# Patient Record
Sex: Male | Born: 1945 | Race: White | Hispanic: No | Marital: Single | Smoking: Former smoker
Health system: Southern US, Community
[De-identification: ages and names within clinical notes are randomized; demographics above are authoritative.]

## PROBLEM LIST (undated history)

## (undated) DIAGNOSIS — K529 Noninfective gastroenteritis and colitis, unspecified: Secondary | ICD-10-CM

## (undated) DIAGNOSIS — A809 Acute poliomyelitis, unspecified: Secondary | ICD-10-CM

## (undated) DIAGNOSIS — F419 Anxiety disorder, unspecified: Secondary | ICD-10-CM

## (undated) DIAGNOSIS — M1990 Unspecified osteoarthritis, unspecified site: Secondary | ICD-10-CM

## (undated) DIAGNOSIS — D649 Anemia, unspecified: Secondary | ICD-10-CM

## (undated) DIAGNOSIS — K219 Gastro-esophageal reflux disease without esophagitis: Secondary | ICD-10-CM

## (undated) DIAGNOSIS — M2550 Pain in unspecified joint: Secondary | ICD-10-CM

## (undated) DIAGNOSIS — L439 Lichen planus, unspecified: Secondary | ICD-10-CM

## (undated) DIAGNOSIS — F32A Depression, unspecified: Secondary | ICD-10-CM

## (undated) DIAGNOSIS — G709 Myoneural disorder, unspecified: Secondary | ICD-10-CM

## (undated) DIAGNOSIS — F329 Major depressive disorder, single episode, unspecified: Secondary | ICD-10-CM

## (undated) DIAGNOSIS — C069 Malignant neoplasm of mouth, unspecified: Secondary | ICD-10-CM

## (undated) DIAGNOSIS — F988 Other specified behavioral and emotional disorders with onset usually occurring in childhood and adolescence: Secondary | ICD-10-CM

## (undated) DIAGNOSIS — K222 Esophageal obstruction: Secondary | ICD-10-CM

## (undated) DIAGNOSIS — M810 Age-related osteoporosis without current pathological fracture: Secondary | ICD-10-CM

## (undated) DIAGNOSIS — H269 Unspecified cataract: Secondary | ICD-10-CM

## (undated) DIAGNOSIS — T7840XA Allergy, unspecified, initial encounter: Secondary | ICD-10-CM

## (undated) DIAGNOSIS — L309 Dermatitis, unspecified: Secondary | ICD-10-CM

## (undated) DIAGNOSIS — K649 Unspecified hemorrhoids: Secondary | ICD-10-CM

## (undated) DIAGNOSIS — K429 Umbilical hernia without obstruction or gangrene: Secondary | ICD-10-CM

## (undated) DIAGNOSIS — R079 Chest pain, unspecified: Secondary | ICD-10-CM

## (undated) HISTORY — DX: Allergy, unspecified, initial encounter: T78.40XA

## (undated) HISTORY — DX: Umbilical hernia without obstruction or gangrene: K42.9

## (undated) HISTORY — DX: Malignant neoplasm of mouth, unspecified: C06.9

## (undated) HISTORY — DX: Age-related osteoporosis without current pathological fracture: M81.0

## (undated) HISTORY — DX: Gastro-esophageal reflux disease without esophagitis: K21.9

## (undated) HISTORY — PX: COLONOSCOPY: SHX174

## (undated) HISTORY — DX: Esophageal obstruction: K22.2

## (undated) HISTORY — DX: Chest pain, unspecified: R07.9

## (undated) HISTORY — DX: Noninfective gastroenteritis and colitis, unspecified: K52.9

## (undated) HISTORY — DX: Anxiety disorder, unspecified: F41.9

## (undated) HISTORY — DX: Unspecified hemorrhoids: K64.9

## (undated) HISTORY — DX: Depression, unspecified: F32.A

## (undated) HISTORY — DX: Acute poliomyelitis, unspecified: A80.9

## (undated) HISTORY — DX: Unspecified osteoarthritis, unspecified site: M19.90

## (undated) HISTORY — DX: Unspecified cataract: H26.9

## (undated) HISTORY — DX: Dermatitis, unspecified: L30.9

## (undated) HISTORY — PX: TONGUE SURGERY: SHX810

## (undated) HISTORY — DX: Myoneural disorder, unspecified: G70.9

## (undated) HISTORY — DX: Lichen planus, unspecified: L43.9

## (undated) HISTORY — DX: Major depressive disorder, single episode, unspecified: F32.9

## (undated) HISTORY — PX: UPPER GASTROINTESTINAL ENDOSCOPY: SHX188

## (undated) HISTORY — DX: Anemia, unspecified: D64.9

## (undated) HISTORY — DX: Pain in unspecified joint: M25.50

## (undated) HISTORY — PX: LEG SURGERY: SHX1003

## (undated) HISTORY — DX: Other specified behavioral and emotional disorders with onset usually occurring in childhood and adolescence: F98.8

---

## 1984-01-25 DIAGNOSIS — K529 Noninfective gastroenteritis and colitis, unspecified: Secondary | ICD-10-CM

## 1984-01-25 HISTORY — DX: Noninfective gastroenteritis and colitis, unspecified: K52.9

## 2004-02-24 ENCOUNTER — Ambulatory Visit: Payer: Self-pay | Admitting: Sports Medicine

## 2004-04-06 ENCOUNTER — Ambulatory Visit: Payer: Self-pay | Admitting: Sports Medicine

## 2006-01-19 ENCOUNTER — Ambulatory Visit: Payer: Self-pay | Admitting: Internal Medicine

## 2010-01-07 ENCOUNTER — Encounter (INDEPENDENT_AMBULATORY_CARE_PROVIDER_SITE_OTHER): Payer: Self-pay | Admitting: *Deleted

## 2010-01-12 ENCOUNTER — Encounter (INDEPENDENT_AMBULATORY_CARE_PROVIDER_SITE_OTHER): Payer: Self-pay | Admitting: *Deleted

## 2010-01-15 ENCOUNTER — Ambulatory Visit: Payer: Self-pay | Admitting: Internal Medicine

## 2010-01-15 ENCOUNTER — Encounter (INDEPENDENT_AMBULATORY_CARE_PROVIDER_SITE_OTHER): Payer: Self-pay | Admitting: *Deleted

## 2010-01-24 HISTORY — PX: COLONOSCOPY: SHX174

## 2010-02-25 NOTE — Miscellaneous (Signed)
Summary: previsit prep/rm  Clinical Lists Changes  Medications: Added new medication of MOVIPREP 100 GM  SOLR (PEG-KCL-NACL-NASULF-NA ASC-C) As per prep instructions. - Signed Rx of MOVIPREP 100 GM  SOLR (PEG-KCL-NACL-NASULF-NA ASC-C) As per prep instructions.;  #1 x 0;  Signed;  Entered by: Sherren Kerns RN;  Authorized by: Hilarie Fredrickson MD;  Method used: Electronically to [facility] # [phone]*, [address] [place], [place]  [address], Ph: [phone], Fax: [phone] Allergies: Added new allergy or adverse reaction of PENICILLIN Observations: Added new observation of ALLERGY REV: Done (01/15/2010 11:02) Added new observation of NKA: F (01/15/2010 11:02)    Prescriptions: MOVIPREP 100 GM  SOLR (PEG-KCL-NACL-NASULF-NA ASC-C) As per prep instructions.  #1 x 0   Entered by:   Sherren Kerns RN   Authorized by:   Hilarie Fredrickson MD   Signed by:   Sherren Kerns RN on 01/15/2010   Method used:   Electronically to        [facility] # [phone]* (retail)       [address]       [place], [place]  [address]       Ph: [phone]       Fax: [phone]   RxID:   [id number]

## 2010-02-25 NOTE — Letter (Signed)
Summary: [facility] Instructions  [facility]  [address] [place], [place] [address]   Phone: [phone]  Fax: [phone]       Jonathan Garrison    12/25/1945    MRN: [id number]        Procedure Day /Date:  Monday 03/01/10     Arrival Time:  9:00 a.m.     Procedure Time:   10:00 a.m.     Location of Procedure:                    _X _  [facility] (4th Floor)                      PREPARATION FOR COLONOSCOPY WITH MOVIPREP   Starting 5 days prior to your procedure  02-24-10  do not eat nuts, seeds, popcorn, corn, beans, peas,  salads, or any raw vegetables.  Do not take any fiber supplements (e.g. Metamucil, Citrucel, and Benefiber).  THE DAY BEFORE YOUR PROCEDURE         DATE: 02-28-10  DAY: Sunday  1.  Drink clear liquids the entire day-NO SOLID FOOD  2.  Do not drink anything colored red or purple.  Avoid juices with pulp.  No orange juice.  3.  Drink at least 64 oz. (8 glasses) of fluid/clear liquids during the day to prevent dehydration and help the prep work efficiently.  CLEAR LIQUIDS INCLUDE: Water Jello Ice Popsicles Tea (sugar ok, no milk/cream) Powdered fruit flavored drinks Coffee (sugar ok, no milk/cream) Gatorade Juice: apple, white grape, white cranberry  Lemonade Clear bullion, consomm, broth Carbonated beverages (any kind) Strained chicken noodle soup Hard Candy                             4.  In the morning, mix first dose of MoviPrep solution:    Empty 1 Pouch A and 1 Pouch B into the disposable container    Add lukewarm drinking water to the top line of the container. Mix to dissolve    Refrigerate (mixed solution should be used within 24 hrs)  5.  Begin drinking the prep at 5:00 p.m. The MoviPrep container is divided by 4 marks.   Every 15 minutes drink the solution down to the next mark (approximately 8 oz) until the full liter is complete.   6.  Follow completed prep with 16 oz of clear liquid of your choice  (Nothing red or purple).  Continue to drink clear liquids until bedtime.  7.  Before going to bed, mix second dose of MoviPrep solution:    Empty 1 Pouch A and 1 Pouch B into the disposable container    Add lukewarm drinking water to the top line of the container. Mix to dissolve    Refrigerate  THE DAY OF YOUR PROCEDURE      DATE:  03-01-10  DAY:  Monday  Beginning at  5:00 a.m. (5 hours before procedure):         1. Every 15 minutes, drink the solution down to the next mark (approx 8 oz) until the full liter is complete.  2. Follow completed prep with 16 oz. of clear liquid of your choice.    3. You may drink clear liquids until  8:00 a.m.  (2 HOURS BEFORE PROCEDURE).   MEDICATION INSTRUCTIONS  Unless otherwise instructed, you should take regular prescription medications with a small sip of  water   as early as possible the morning of your procedure.           OTHER INSTRUCTIONS  You will need a responsible adult at least 64 years of age to accompany you and drive you home.   This person must remain in the waiting room during your procedure.  Wear loose fitting clothing that is easily removed.  Leave jewelry and other valuables at home.  However, you may wish to bring a book to read or  an iPod/MP3 player to listen to music as you wait for your procedure to start.  Remove all body piercing jewelry and leave at home.  Total time from sign-in until discharge is approximately 2-3 hours.  You should go home directly after your procedure and rest.  You can resume normal activities the  day after your procedure.  The day of your procedure you should not:   Drive   Make legal decisions   Operate machinery   Drink alcohol   Return to work  You will receive specific instructions about eating, activities and medications before you leave.    The above instructions have been reviewed and explained to me by   Sherren Kerns RN  January 15, 2010 11:38 AM    I  fully understand and can verbalize these instructions _____________________________ Date _________

## 2010-02-25 NOTE — Letter (Signed)
Summary: [facility] Instructions  [facility]  [address] [place], [place] [address]   Phone: [phone]  Fax: [phone]       Jonathan Garrison    1945-04-06    MRN: [id number]        Procedure Day /Date: Monday 03-01-10     Arrival Time: 10:30 am     Procedure Time: 11:30 am     Location of Procedure:                    _x _  [facility] (4th Floor)                        PREPARATION FOR COLONOSCOPY WITH MOVIPREP   Starting 5 days prior to your procedure  02-24-10 do not eat nuts, seeds, popcorn, corn, beans, peas,  salads, or any raw vegetables.  Do not take any fiber supplements (e.g. Metamucil, Citrucel, and Benefiber).  THE DAY BEFORE YOUR PROCEDURE         DATE:  02-28-10  DAY:  Sunday   1.  Drink clear liquids the entire day-NO SOLID FOOD  2.  Do not drink anything colored red or purple.  Avoid juices with pulp.  No orange juice.  3.  Drink at least 64 oz. (8 glasses) of fluid/clear liquids during the day to prevent dehydration and help the prep work efficiently.  CLEAR LIQUIDS INCLUDE: Water Jello Ice Popsicles Tea (sugar ok, no milk/cream) Powdered fruit flavored drinks Coffee (sugar ok, no milk/cream) Gatorade Juice: apple, white grape, white cranberry  Lemonade Clear bullion, consomm, broth Carbonated beverages (any kind) Strained chicken noodle soup Hard Candy                             4.  In the morning, mix first dose of MoviPrep solution:    Empty 1 Pouch A and 1 Pouch B into the disposable container    Add lukewarm drinking water to the top line of the container. Mix to dissolve    Refrigerate (mixed solution should be used within 24 hrs)  5.  Begin drinking the prep at 5:00 p.m. The MoviPrep container is divided by 4 marks.   Every 15 minutes drink the solution down to the next mark (approximately 8 oz) until the full liter is complete.   6.  Follow completed prep with 16 oz of clear liquid of your choice  (Nothing red or purple).  Continue to drink clear liquids until bedtime.  7.  Before going to bed, mix second dose of MoviPrep solution:    Empty 1 Pouch A and 1 Pouch B into the disposable container    Add lukewarm drinking water to the top line of the container. Mix to dissolve    Refrigerate  THE DAY OF YOUR PROCEDURE      DATE:  03-01-10  DAY:  Monday  Beginning at  6:30 a.m. (5 hours before procedure):         1. Every 15 minutes, drink the solution down to the next mark (approx 8 oz) until the full liter is complete.  2. Follow completed prep with 16 oz. of clear liquid of your choice.    3. You may drink clear liquids until  9:30 a.m.  (2 HOURS BEFORE PROCEDURE).   MEDICATION INSTRUCTIONS  Unless otherwise instructed, you should take regular prescription medications with a small sip of  water   as early as possible the morning of your procedure.           OTHER INSTRUCTIONS  You will need a responsible adult at least 64 years of age to accompany you and drive you home.   This person must remain in the waiting room during your procedure.  Wear loose fitting clothing that is easily removed.  Leave jewelry and other valuables at home.  However, you may wish to bring a book to read or  an iPod/MP3 player to listen to music as you wait for your procedure to start.  Remove all body piercing jewelry and leave at home.  Total time from sign-in until discharge is approximately 2-3 hours.  You should go home directly after your procedure and rest.  You can resume normal activities the  day after your procedure.  The day of your procedure you should not:   Drive   Make legal decisions   Operate machinery   Drink alcohol   Return to work  You will receive specific instructions about eating, activities and medications before you leave.    The above instructions have been reviewed and explained to me by   Sherren Kerns RN  January 15, 2010 11:58 AM   I  fully understand and can verbalize these instructions _____________________________ Date _________

## 2010-02-25 NOTE — Letter (Signed)
Summary: Pre Visit Letter Revised  [facility]  [address] [place], [place] [address]   Phone: [phone]  Fax: [phone]        01/07/2010 MRN: [id number] Jonathan Garrison [address] [place], [place]  [address]             Procedure Date:  01/28/2010   Welcome to the Gastroenterology Division at [facility].    You are scheduled to see a nurse for your pre-procedure visit on 01/15/2010 at 11:00 AM on the 3rd floor at [facility], [address]  We ask that you try to arrive at our office 15 minutes prior to your appointment time to allow for check-in.  Please take a minute to review the attached form.  If you answer "Yes" to one or more of the questions on the first page, we ask that you call the person listed at your earliest opportunity.  If you answer "No" to all of the questions, please complete the rest of the form and bring it to your appointment.    Your nurse visit will consist of discussing your medical and surgical history, your immediate family medical history, and your medications.   If you are unable to list all of your medications on the form, please bring the medication bottles to your appointment and we will list them.  We will need to be aware of both prescribed and over the counter drugs.  We will need to know exact dosage information as well.    Please be prepared to read and sign documents such as consent forms, a financial agreement, and acknowledgement forms.  If necessary, and with your consent, a friend or relative is welcome to sit-in on the nurse visit with you.  Please bring your insurance card so that we may make a copy of it.  If your insurance requires a referral to see a specialist, please bring your referral form from your primary care physician.  No co-pay is required for this nurse visit.     If you cannot keep your appointment, please call [phone] to cancel or reschedule prior to your appointment date.  This  allows [place] the opportunity to schedule an appointment for another patient in need of care.    Thank you for choosing [facility] for your medical needs.  We appreciate the opportunity to care for you.  Please visit [place] at our website  to learn more about our practice.  Sincerely, The Gastroenterology Division

## 2010-03-01 ENCOUNTER — Other Ambulatory Visit (AMBULATORY_SURGERY_CENTER): Payer: BC Managed Care – PPO | Admitting: Internal Medicine

## 2010-03-01 ENCOUNTER — Encounter: Payer: Self-pay | Admitting: Internal Medicine

## 2010-03-01 DIAGNOSIS — K648 Other hemorrhoids: Secondary | ICD-10-CM

## 2010-03-01 DIAGNOSIS — Z1211 Encounter for screening for malignant neoplasm of colon: Secondary | ICD-10-CM

## 2010-03-11 NOTE — Procedures (Signed)
Summary: Colonoscopy   Colonoscopy  Procedure date:  03/01/2010  Findings:      Location:  [facility].    Procedures Next Due Date:    Colonoscopy: 02/2020 COLONOSCOPY PROCEDURE REPORT  PATIENT:  Jonathan Garrison, Jonathan Garrison  MR#:  [id number] BIRTHDATE:   Jonathan Garrison, Jonathan Garrison, 64 yrs. old   GENDER:   male ENDOSCOPIST:   Jonathan Bonito. Eda Keys, MD REF. BY: Jonathan Garrison, M.D. PROCEDURE DATE:  03/01/2010 PROCEDURE:  Average-risk screening colonoscopy G0121 ASA CLASS:   Class II INDICATIONS: Routine Risk Screening  MEDICATIONS:    Fentanyl 75 mcg IV, Versed 9 mg IV  DESCRIPTION OF PROCEDURE:   After the risks benefits and alternatives of the procedure were thoroughly explained, informed consent was obtained.  Digital rectal exam was performed and revealed no abnormalities.   The LB 180AL [id number] endoscope was introduced through the anus and advanced to the cecum, which was identified by both the appendix and ileocecal valve, without limitations.Time to cecum = 7:40 min.  The quality of the prep was good, using MoviPrep.  The instrument was then slowly withdrawn (time = 14:00 min) as the colon was fully examined. <<PROCEDUREIMAGES>>        <<OLD IMAGES>>  FINDINGS:  A normal appearing cecum, ileocecal valve, and appendiceal orifice were identified. The ascending, hepatic flexure, transverse, splenic flexure, descending, sigmoid colon, and rectum appeared unremarkable.  No polyps or cancers were seen.   Retroflexed views in the rectum revealed internal hemorrhoids.    The scope was then withdrawn from the patient and the procedure completed.  COMPLICATIONS:   None ENDOSCOPIC IMPRESSION:  1) Normal colon  2) No polyps or cancers  3) Small Internal hemorrhoids RECOMMENDATIONS:  1) Continue current colorectal screening recommendations for "routine risk" patients with a repeat colonoscopy in 10 years.  _______________________________ Jonathan Bonito. Eda Keys, MD  CC: Jonathan Sia, MD; The  Patient

## 2010-05-25 DIAGNOSIS — C069 Malignant neoplasm of mouth, unspecified: Secondary | ICD-10-CM

## 2010-05-25 HISTORY — DX: Malignant neoplasm of mouth, unspecified: C06.9

## 2010-07-08 ENCOUNTER — Encounter: Payer: Self-pay | Admitting: Family Medicine

## 2010-07-08 ENCOUNTER — Ambulatory Visit (INDEPENDENT_AMBULATORY_CARE_PROVIDER_SITE_OTHER): Payer: BC Managed Care – PPO | Admitting: Family Medicine

## 2010-07-08 VITALS — BP 128/78 | HR 84 | Ht 73.5 in | Wt 200.0 lb

## 2010-07-08 DIAGNOSIS — F988 Other specified behavioral and emotional disorders with onset usually occurring in childhood and adolescence: Secondary | ICD-10-CM

## 2010-07-08 DIAGNOSIS — D485 Neoplasm of uncertain behavior of skin: Secondary | ICD-10-CM

## 2010-07-08 DIAGNOSIS — C069 Malignant neoplasm of mouth, unspecified: Secondary | ICD-10-CM

## 2010-07-08 MED ORDER — AMPHETAMINE-DEXTROAMPHETAMINE 10 MG PO TABS: ORAL_TABLET | ORAL | Status: DC

## 2010-07-08 NOTE — Progress Notes (Signed)
Subjective:    Patient ID: Jonathan Garrison, male    DOB: 05-20-1945, 65 y.o.   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  Scheduled for excision with Dr. Vertell Limber at [facility] on 07/21/10.  Patient has an unhealing sore at the top of his "butt crack", with a whitish crust.  Has been present x months.  Sees the dermatologist regularly, but this wasn't noticed/discussed at last visit.  He has an appointment with Dr. Sherryl Barters assistant on Monday for a biopsy.  Previously saw Dr. Merla Riches for his physicals, but wanting to change physicians.  Patient has been on Adderal for about 15 years.  Has been on current dose for a long time--takes 10mg  in the morning, and 5mg  at 4-5 pm when he gets home.  Finds that he gets very distracted after getting home, and isn't very productive.  Wondering if he needs to change dose.  House is very distracting, has a lot of things, including things from his mother (who passed away 3 years ago).  He describes himself as "almost a hoarder".  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  Patient has questions regarding herpes lab results, brought in by patient.  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  Explained that his history of having had cold sores in the past explains this.  Past Medical History  Diagnosis Date  . ADD (attention deficit disorder) without hyperactivity   . 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 Eczema   . Lichen planus   . Allergy   . Polio age 59  . Arthralgia     many joints, managed with vitamins/herbs and ibuprofen  . Colitis 1986    single attack (believes was ulcerative)  . Pleurisy 1984    Past Surgical History  Procedure Date  . Leg surgery when in 6th grade    L leg, bone spur removal (just above knee, laterally)    History   Social History  . Marital Status: Divorced    Spouse Name: N/A    Number of Children: N/A  . Years of  Education: N/A   Occupational History  . 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 Smoking status: Former Smoker    Types: Pipe, Cigarettes    Quit date: 01/25/1976  . Smokeless tobacco: Never Used  . Alcohol Use: Yes     socially 1-2 times per month  . Drug Use: No  . Sexually Active: Not on file   Other Topics Concern  . Not on file   Social History Narrative  . No narrative on file    Family History  Problem Relation Age of Onset  . Hypertension Mother   . Diabetes Mother   . Hyperlipidemia Mother   . Hypertension Father   . Benign prostatic hyperplasia Father   . Heart disease Father   . Lichen planus Sister   . Cancer Sister     ?spot on chest?  . Diabetes Maternal Grandfather     Current outpatient prescriptions:amphetamine-dextroamphetamine (ADDERALL) 10 MG tablet, 1 tablet in the morning, 1/2- 1 tablet in the early afternoon, Disp: 60 tablet, Rfl: 0;  aspirin 325 MG tablet, Take 87.5 mg by mouth daily.  , Disp: , Rfl: ;  b complex vitamins tablet, Take 0.5 tablets by mouth daily.  , Disp: ,  Rfl: ;  chlorpheniramine (CHLOR-TRIMETON) 4 MG tablet, Take 2 mg by mouth 2 (two) times daily as needed.  , Disp: , Rfl:  glucosamine-chondroitin 500-400 MG tablet, Take 1 tablet by mouth 2 (two) times daily.  , Disp: , Rfl: ;  ibuprofen (ADVIL,MOTRIN) 200 MG tablet, Take 400 mg by mouth 2 (two) times daily.  , Disp: , Rfl: ;  Lysine 500 MG TABS, Take 2 tablets by mouth daily.  , Disp: , Rfl: ;  Multiple Vitamins-Minerals (MULTIVITAMIN WITH MINERALS) tablet, Take 1 tablet by mouth daily.  , Disp: , Rfl:  Omega-3 Fatty Acids (FISH OIL) 1000 MG CAPS, Take 1 capsule by mouth daily.  , Disp: , Rfl: ;  vitamin C (ASCORBIC ACID) 500 MG tablet, Take 250 mg by mouth 2 (two) times daily.  , Disp: , Rfl: ;  vitamin E 100 UNIT capsule, Take 100 Units by mouth daily.  , Disp: , Rfl:   Allergies  Allergen Reactions  . Penicillins     REACTION: ?  .  Sulfa Antibiotics Other (See Comments)    unknown    Review of Systems Denies fevers.  Complains of alternating loose stools and hard stools.  Occasional depression, h/o occasional suicidal thoughts.  Chronic trouble falling asleep.  Reports a lot of stress--related to recent dx of oral cancer, mother's illness/death and dealing with her estate.  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  Occasional rapid heartbeat/palpitation noted    Objective:   Physical Exam  Well developed, well nourished patient, in no distress, appears mildly anxious and very talkative, sometimes switching topics frequently BP 128/78  Pulse 84  Ht 6' 1.5" (1.867 m)  Wt 200 lb (90.719 kg)  BMI 26.03 kg/m2 HEENT:  PERRL, EOMI, conjunctiva clear.  OP--under L side of tongue (at base) an area of scar tissue with slight leukoplakia posterior to it.  Remainder of OP exam normal. Neck: No lymphadenopathy or thyromegaly.  Somewhat prominent submandibular glands bilaterally Heart:  Regular rate and rhythm, no murmurs, rubs, gallops or ectopy Lungs:  Clear bilaterally, without wheezes, rales or ronchi Abdomen:  Soft, nontender, nondistended, no hepatosplenomegaly or masses, normal bowel sounds Extremities:  No clubbing,or cyanosis; trace edema, 2+ pulses.  Neuro:  Alert and oriented x 3, cranial nerves grossly intact. Normal gait. Back:  No spine or CVA tenderness Skin: no rashes; lesion at L upper buttock has white central scale/plaque, with some surrounding erythema.  First impression is that of healing boil/abscess, but there is no induration/thickening of the skin at all Psych:  Normal mood, affect, hygiene and grooming, eye contact.  Does not appear depressed.  Speech isn't pressured, but talks a lot and jumps from topic to topic, speaking in great detail.  Unsure if this is more related to ADD vs hypomania      Assessment & Plan:   1. ADD (attention  deficit disorder)  amphetamine-dextroamphetamine (ADDERALL) 10 MG tablet  2. 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 Neoplasm of uncertain behavior of skin     Has appt with dermatologist Monday for biopsy; L upper buttock   ADD:  We discussed that during the school year, if he continues to have trouble concentrating in the early evening/after school that he can try taking 2nd dose of Adderall a little earlier (2 pm), and can increase to full pill.  For now, recommend working on removing much of the distractions in his  home, as he plans.  Lesion on L buttock--agree with plans for biopsy.  Picture was taken with pt's camera per his request  Oral Cancer--surgery as scheduled  Recommended f/u on ADD in the fall, after school starts.  He plans to schedule CPE.  Length of visit was 55 minutes, all face to face.  All counseling and answering his many questions

## 2010-07-09 ENCOUNTER — Encounter: Payer: Self-pay | Admitting: Family Medicine

## 2010-07-09 DIAGNOSIS — C069 Malignant neoplasm of mouth, unspecified: Secondary | ICD-10-CM | POA: Insufficient documentation

## 2010-07-09 DIAGNOSIS — F988 Other specified behavioral and emotional disorders with onset usually occurring in childhood and adolescence: Secondary | ICD-10-CM | POA: Insufficient documentation

## 2011-01-20 ENCOUNTER — Ambulatory Visit (INDEPENDENT_AMBULATORY_CARE_PROVIDER_SITE_OTHER): Payer: BC Managed Care – PPO

## 2011-01-20 DIAGNOSIS — Z0000 Encounter for general adult medical examination without abnormal findings: Secondary | ICD-10-CM

## 2011-01-20 DIAGNOSIS — H6120 Impacted cerumen, unspecified ear: Secondary | ICD-10-CM

## 2011-03-28 ENCOUNTER — Telehealth: Payer: Self-pay

## 2011-03-28 NOTE — Telephone Encounter (Signed)
Pt states he is trying to get refill on valicyclovir hcl 1gram. States pharmacy said no refills left and he doesn't know why. Uses [facility] [address]. Sees dr Cleta Alberts.  Best: [phone] bf

## 2011-03-28 NOTE — Telephone Encounter (Signed)
Need chart

## 2011-03-29 MED ORDER — VALACYCLOVIR HCL 1 G PO TABS: 1000.0000 mg | ORAL_TABLET | Freq: Two times a day (BID) | ORAL | Status: AC

## 2011-03-29 NOTE — Telephone Encounter (Signed)
LMOM FOR PT TO CALL WITH WHICH [facility] [address]

## 2011-03-29 NOTE — Telephone Encounter (Signed)
Rx printed in error.  There are [facility] on [address].  Please call to pharmacy of patient's choice. csj

## 2011-03-30 NOTE — Telephone Encounter (Signed)
Valtrex called into [facility] at [phone].

## 2011-03-30 NOTE — Telephone Encounter (Signed)
LMOM FOR PT TO C/B.

## 2011-08-01 ENCOUNTER — Telehealth: Payer: Self-pay

## 2011-08-01 NOTE — Telephone Encounter (Signed)
Last seen here 02/2010.Marland Kitchen Needs OV?

## 2011-08-01 NOTE — Telephone Encounter (Signed)
Needs office visit.

## 2011-08-01 NOTE — Telephone Encounter (Signed)
PT STATES HE HAVE NEVER RECEIVED THE ANTIDEPRESSANT MEDICINE FROM [place] BEFORE BUT WOULD LIKE TO HAVE [place] REFILL HIS LEXAPRO AND FONASE PLEASE CALL [phone]    [facility] [address]

## 2011-08-02 MED ORDER — FLUTICASONE PROPIONATE 50 MCG/ACT NA SUSP: 2.0000 | Freq: Every day | NASAL | Status: DC

## 2011-08-02 MED ORDER — ESCITALOPRAM OXALATE 10 MG PO TABS: 10.0000 mg | ORAL_TABLET | Freq: Every day | ORAL | Status: DC

## 2011-08-02 NOTE — Telephone Encounter (Signed)
Pt CB and stated that he was seen last Dec or Jan for CPE w/Dr Daub who put him on the Lexapro then. He would like a RF on it and his Flonase today if possible bc he is out and knows he is not supposed to just stop the Lexapro. Checked paper chart and pt did have CPE in Dec. Per Herbert Seta, can give him 1 month of each, then pt due for OV. Sending in RFs and notified pt done and that he is due for OV bf next RFs needed. Pt agreed.

## 2011-08-02 NOTE — Telephone Encounter (Signed)
lmom that needs OV.

## 2011-08-04 DIAGNOSIS — D101 Benign neoplasm of tongue: Secondary | ICD-10-CM | POA: Diagnosis not present

## 2011-08-04 DIAGNOSIS — K1329 Other disturbances of oral epithelium, including tongue: Secondary | ICD-10-CM | POA: Diagnosis not present

## 2011-08-23 ENCOUNTER — Ambulatory Visit (INDEPENDENT_AMBULATORY_CARE_PROVIDER_SITE_OTHER): Payer: Medicare Other | Admitting: Family Medicine

## 2011-08-23 VITALS — BP 137/81 | HR 90 | Temp 98.9°F | Resp 20 | Ht 73.75 in | Wt 216.0 lb

## 2011-08-23 DIAGNOSIS — M25473 Effusion, unspecified ankle: Secondary | ICD-10-CM

## 2011-08-23 DIAGNOSIS — R635 Abnormal weight gain: Secondary | ICD-10-CM | POA: Diagnosis not present

## 2011-08-23 DIAGNOSIS — F341 Dysthymic disorder: Secondary | ICD-10-CM

## 2011-08-23 DIAGNOSIS — R609 Edema, unspecified: Secondary | ICD-10-CM

## 2011-08-23 DIAGNOSIS — M2550 Pain in unspecified joint: Secondary | ICD-10-CM

## 2011-08-23 DIAGNOSIS — F988 Other specified behavioral and emotional disorders with onset usually occurring in childhood and adolescence: Secondary | ICD-10-CM

## 2011-08-23 DIAGNOSIS — R350 Frequency of micturition: Secondary | ICD-10-CM

## 2011-08-23 DIAGNOSIS — M79609 Pain in unspecified limb: Secondary | ICD-10-CM

## 2011-08-23 DIAGNOSIS — F418 Other specified anxiety disorders: Secondary | ICD-10-CM

## 2011-08-23 LAB — URIC ACID: Uric Acid, Serum: 5.6 mg/dL (ref 4.0–7.8)

## 2011-08-23 LAB — CBC WITH DIFFERENTIAL/PLATELET
Basophils Absolute: 0 10*3/uL (ref 0.0–0.1)
HCT: 38.6 % — ABNORMAL LOW (ref 39.0–52.0)
Lymphocytes Relative: 25 % (ref 12–46)
Monocytes Absolute: 0.5 10*3/uL (ref 0.1–1.0)
Neutro Abs: 3.2 10*3/uL (ref 1.7–7.7)
Platelets: 285 10*3/uL (ref 150–400)
RDW: 13.5 % (ref 11.5–15.5)
WBC: 5.3 10*3/uL (ref 4.0–10.5)

## 2011-08-23 LAB — POCT UA - MICROSCOPIC ONLY
Bacteria, U Microscopic: NEGATIVE
Crystals, Ur, HPF, POC: NEGATIVE

## 2011-08-23 LAB — POCT URINALYSIS DIPSTICK
Blood, UA: NEGATIVE
Glucose, UA: NEGATIVE
Nitrite, UA: NEGATIVE
Urobilinogen, UA: 0.2

## 2011-08-23 LAB — BASIC METABOLIC PANEL
BUN: 24 mg/dL — ABNORMAL HIGH (ref 6–23)
Potassium: 5.4 mEq/L — ABNORMAL HIGH (ref 3.5–5.3)

## 2011-08-23 MED ORDER — ESCITALOPRAM OXALATE 20 MG PO TABS: 20.0000 mg | ORAL_TABLET | Freq: Every day | ORAL | Status: AC

## 2011-08-23 MED ORDER — AMPHETAMINE-DEXTROAMPHETAMINE 20 MG PO TABS: ORAL_TABLET | ORAL | Status: DC

## 2011-08-23 MED ORDER — CIPROFLOXACIN HCL 500 MG PO TABS: 500.0000 mg | ORAL_TABLET | Freq: Two times a day (BID) | ORAL | Status: AC

## 2011-08-23 NOTE — Patient Instructions (Signed)
Edema Edema is an abnormal build-up of fluids in tissues. Because this is partly dependent on gravity (water flows to the lowest place), it is more common in the leg sand thighs (lower extremities). It is also common in the looser tissues, like around the eyes. Painless swelling of the feet and ankles is common and increases as a person ages. It may affect both legs and may include the calves or even thighs. When squeezed, the fluid may move out of the affected area and may leave a dent for a few moments. CAUSES   Prolonged standing or sitting in one place for extended periods of time. Movement helps pump tissue fluid into the veins, and absence of movement prevents this, resulting in edema.   Varicose veins. The valves in the veins do not work as well as they should. This causes fluid to leak into the tissues.   Fluid and salt overload.   Injury, burn, or surgery to the leg, ankle, or foot, may damage veins and allow fluid to leak out.   Sunburn damages vessels. Leaky vessels allow fluid to go out into the sunburned tissues.   Allergies (from insect bites or stings, medications or chemicals) cause swelling by allowing vessels to become leaky.   Protein in the blood helps keep fluid in your vessels. Low protein, as in malnutrition, allows fluid to leak out.   Hormonal changes, including pregnancy and menstruation, cause fluid retention. This fluid may leak out of vessels and cause edema.   Medications that cause fluid retention. Examples are sex hormones, blood pressure medications, steroid treatment, or anti-depressants.   Some illnesses cause edema, especially heart failure, kidney disease, or liver disease.   Surgery that cuts veins or lymph nodes, such as surgery done for the heart or for breast cancer, may result in edema.  DIAGNOSIS  Your caregiver is usually easily able to determine what is causing your swelling (edema) by simply asking what is wrong (getting a history) and examining  you (doing a physical). Sometimes x-rays, EKG (electrocardiogram or heart tracing), and blood work may be done to evaluate for underlying medical illness. TREATMENT  General treatment includes:  Leg elevation (or elevation of the affected body part).   Restriction of fluid intake.   Prevention of fluid overload.   Compression of the affected body part. Compression with elastic bandages or support stockings squeezes the tissues, preventing fluid from entering and forcing it back into the blood vessels.   Diuretics (also called water pills or fluid pills) pull fluid out of your body in the form of increased urination. These are effective in reducing the swelling, but can have side effects and must be used only under your caregiver's supervision. Diuretics are appropriate only for some types of edema.  The specific treatment can be directed at any underlying causes discovered. Heart, liver, or kidney disease should be treated appropriately. HOME CARE INSTRUCTIONS   Elevate the legs (or affected body part) above the level of the heart, while lying down.   Avoid sitting or standing still for prolonged periods of time.   Avoid putting anything directly under the knees when lying down, and do not wear constricting clothing or garters on the upper legs.   Exercising the legs causes the fluid to work back into the veins and lymphatic channels. This may help the swelling go down.   The pressure applied by elastic bandages or support stockings can help reduce ankle swelling.   A low-salt diet may help reduce fluid   retention and decrease the ankle swelling.   Take any medications exactly as prescribed.  SEEK MEDICAL CARE IF:  Your edema is not responding to recommended treatments. SEEK IMMEDIATE MEDICAL CARE IF:   You develop shortness of breath or chest pain.   You cannot breathe when you lay down; or if, while lying down, you have to get up and go to the window to get your breath.   You  are having increasing swelling without relief from treatment.   You develop a fever over 102 F (38.9 C).   You develop pain or redness in the areas that are swollen.   Tell your caregiver right away if you have gained 3 lb/1.4 kg in 1 day or 5 lb/2.3 kg in a week.  MAKE SURE YOU:   Understand these instructions.   Will watch your condition.   Will get help right away if you are not doing well or get worse.  Document Released: 01/10/2005 Document Revised: 12/30/2010 Document Reviewed: 08/29/2007 Exit[employer] Patient Information 2012 [employer]. 

## 2011-08-26 ENCOUNTER — Encounter: Payer: Self-pay | Admitting: Family Medicine

## 2011-08-26 DIAGNOSIS — M2550 Pain in unspecified joint: Secondary | ICD-10-CM | POA: Insufficient documentation

## 2011-08-26 DIAGNOSIS — F418 Other specified anxiety disorders: Secondary | ICD-10-CM | POA: Insufficient documentation

## 2011-08-26 DIAGNOSIS — E663 Overweight: Secondary | ICD-10-CM | POA: Insufficient documentation

## 2011-08-26 NOTE — Progress Notes (Signed)
Subjective:    Patient ID: Jonathan Garrison, male    DOB: Jan 12, 1946, 66 y.o.   MRN: [id number]  HPI  This 66 y.o. Cauc male is here to have several issues addressed. Last CPE- Dec 2012.  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 He now spends many hours  on computer at home and has noticed ankle edema. He sits with his legs bent such that his feet  are under the chair. He is "something of a hoarder", having many of his mother's belongings  still in the home 3 years after her death. He cannot sleep in his bed because it is covered with  books, cameras and other items. He has discussed this with his therapist and plan to tackle  organization since he is not working. He sleeps instead in his T.V.chair with legs semi-elevated   for months; now ankles are painfully swollen.    Hx of Polio in 1951.   He c/o frequency and pink-tinged semen with odor; denies dysuria or urgency. No gross hematuria.  No fever. He has a feeling of heaviness and fullness in the perineum; feels this most with defecation.   ADD- needs medication refilled. He is taking Lexapro (generic) which is very helpful. He feels  like these medications will help him focus on the task ahead- to get rid of or sell many of the guitars,  books, cameras, etc. On [employer]bay. These proceeds are meant to supplement his income so  That he  can delay taking Social Security until age 66 or 66.     Review of Systems As per HPI     Objective:   Physical Exam  Nursing note and vitals reviewed. Constitutional: He is oriented to person, place, and time. He appears well-developed and well-nourished. No distress.  HENT:  Head: Normocephalic and atraumatic.  Right Ear: Hearing, tympanic membrane, external ear and ear canal normal.  Left Ear: Hearing, tympanic membrane, external ear and ear canal normal.  Nose: Nose normal. No mucosal edema, nasal deformity or septal deviation.  Mouth/Throat: Uvula is midline, oropharynx  is clear and moist and mucous membranes are normal. No oral lesions. Normal dentition.  Eyes: Conjunctivae and EOM are normal. No scleral icterus.  Neck: Normal range of motion. Neck supple. No thyromegaly present.  Cardiovascular: Normal rate, regular rhythm, normal heart sounds and intact distal pulses.  Exam reveals no gallop and no friction rub.   No murmur heard. Pulmonary/Chest: Effort normal and breath sounds normal. No respiratory distress. He has no wheezes.  Abdominal: Soft. Bowel sounds are normal. He exhibits no distension, no abdominal bruit, no pulsatile midline mass and no mass. There is no hepatosplenomegaly. There is no tenderness. There is no guarding and no CVA tenderness. No hernia.  Genitourinary: Rectum normal, testes normal and penis normal. Rectal exam shows no external hemorrhoid, no fissure, no mass and no tenderness. Prostate is enlarged. Prostate is not tender. Cremasteric reflex is present. Right testis shows no mass, no swelling and no tenderness. Left testis shows no mass, no swelling and no tenderness.  Musculoskeletal: Normal range of motion. He exhibits edema and tenderness.       Lower ext: 1+ ankle edema  Lymphadenopathy:    He has no cervical adenopathy.       Right: No inguinal adenopathy present.       Left: No inguinal adenopathy present.  Neurological: He is alert and oriented to person, place, and time. He has normal reflexes. No cranial nerve deficit.  He exhibits normal muscle tone. Coordination normal.  Skin: Skin is warm and dry. No rash noted.       Lower ext: distal 1/3 with faint brawny/ erythematous discoloration  Psychiatric: Judgment and thought content normal. His mood appears anxious. His affect is not angry and not labile. His speech is rapid and/or pressured and tangential. He is is hyperactive. He is not agitated. Cognition and memory are normal. He does not exhibit a depressed mood.       Pt very talkative throughout evaluation, even during  moments of the physical exam He is inattentive.        Assessment & Plan:   1. Ankle edema - dependent edema due to prolonged sitting and impending blood flow/ venous obstruction Basic metabolic panel, Uric Acid, CBC with Differential Pt advised to change sitting position while working at computer Needs to clear off bed so that he can properly recline and get feet elevated at night for good sleep hygiene  2. Weight gain - due to sedentary lifestyle and nutrition   3. Urinary frequency -suspect sub-acute prostatitis POCT urinalysis dipstick, POCT UA - Microscopic Only Pt did collect 2 specimens but unclear if UA was performed on "dirty" specimen (no urine collection post prostate exam)  4. Limb pain  Improve physical activity  5. Depression with anxiety  RF: Lexapro (generic)  6. ADD (attention deficit disorder)  RF: Adderall 20 mg #60 for 3 months

## 2011-08-26 NOTE — Progress Notes (Signed)
Quick Note:  Please call pt and advise that the following labs are abnormal... Your labs look good. Your potassium level is very slightly elevated. If you are eating or consuming food products that have a high potassium content, it would be wise to reduce that.  You do not have gout and your blood counts are normal.  Getting your legs elevated overnight will help reduce swelling and reduce joint and leg pain.  Copy to pt. ______

## 2011-08-29 ENCOUNTER — Encounter: Payer: Self-pay | Admitting: Emergency Medicine

## 2011-09-12 DIAGNOSIS — D485 Neoplasm of uncertain behavior of skin: Secondary | ICD-10-CM | POA: Diagnosis not present

## 2011-09-12 DIAGNOSIS — L820 Inflamed seborrheic keratosis: Secondary | ICD-10-CM | POA: Diagnosis not present

## 2011-09-12 DIAGNOSIS — L905 Scar conditions and fibrosis of skin: Secondary | ICD-10-CM | POA: Diagnosis not present

## 2011-09-12 DIAGNOSIS — D2360 Other benign neoplasm of skin of unspecified upper limb, including shoulder: Secondary | ICD-10-CM | POA: Diagnosis not present

## 2012-01-23 DIAGNOSIS — Z23 Encounter for immunization: Secondary | ICD-10-CM | POA: Diagnosis not present

## 2012-02-09 DIAGNOSIS — H40019 Open angle with borderline findings, low risk, unspecified eye: Secondary | ICD-10-CM | POA: Diagnosis not present

## 2012-04-28 ENCOUNTER — Telehealth: Payer: Self-pay

## 2012-04-28 NOTE — Telephone Encounter (Signed)
Dr. Merla Riches: patient would like to pick up several months worth of his Adderall please.  20mg  x60 Best number is [phone]. Please call when ready

## 2012-04-30 NOTE — Telephone Encounter (Signed)
Called patient.  To advise he is due for follow up, he is transferred to appt scheduling for this. He states he is going to call back and make appt. He is instructed to call back when he gets the appt scheduled and I will ask Dr Audria Nine if she can give him supply until he can come in. He asked if Dr Merla Riches can do this since he knows him. I advised him there are guidelines for controlled substances, and Dr Merla Riches can not prescribe these, and it would be best if he were to see Dr Audria Nine, he states he will call back and get appt with Dr Audria Nine or Dr Cleta Alberts.

## 2012-04-30 NOTE — Telephone Encounter (Signed)
He is no longer my patient  See Dr Delford Field note 2012 and Dr McPherson's last year  The cause unknown on a personal basis through music, I would prefer that he followup with the other physicians he has chosen

## 2012-05-08 NOTE — Telephone Encounter (Signed)
Pt states that he made an appt with Dr Audria Nine for May 22nd for a CPE. Pt was told once he made the appt he will be able to get a month supply of Adderall 20mg  60 tabs. Please call member back to advise of outcome @ [phone]. Thanks

## 2012-05-09 MED ORDER — AMPHETAMINE-DEXTROAMPHETAMINE 20 MG PO TABS: ORAL_TABLET | ORAL | Status: DC

## 2012-05-09 NOTE — Telephone Encounter (Signed)
I will prescribe 1 month supply of medication though pt has been without medication for several months. RX is at 104 for pick-up.

## 2012-05-09 NOTE — Telephone Encounter (Signed)
Spoke with patient and let him know that rx was ready for pick up at 104.

## 2012-05-09 NOTE — Telephone Encounter (Signed)
Patient has made appt with you for follow up he is requesting Adderall to last until his appt on 06/14/12. Please advise

## 2012-06-14 ENCOUNTER — Encounter: Payer: Medicare Other | Admitting: Family Medicine

## 2012-07-04 ENCOUNTER — Telehealth: Payer: Self-pay

## 2012-07-04 NOTE — Telephone Encounter (Signed)
Okay, documented. Reason for call was because he filled out an incomplete medical release form earlier this month. LMOM several times for this. Glad he got his records.

## 2012-07-04 NOTE — Telephone Encounter (Signed)
Pt came by 104 today. Wanted medical records to know he has received the records he needed from [place]. Says we are calling him about these records but he has already picked them up. No callback needed

## 2013-02-14 ENCOUNTER — Ambulatory Visit (INDEPENDENT_AMBULATORY_CARE_PROVIDER_SITE_OTHER): Payer: Medicare Other | Admitting: Emergency Medicine

## 2013-02-14 ENCOUNTER — Ambulatory Visit: Payer: Medicare Other

## 2013-02-14 VITALS — BP 142/86 | HR 82 | Temp 98.5°F | Resp 17 | Ht 74.0 in | Wt 224.0 lb

## 2013-02-14 DIAGNOSIS — R1013 Epigastric pain: Secondary | ICD-10-CM

## 2013-02-14 DIAGNOSIS — R079 Chest pain, unspecified: Secondary | ICD-10-CM | POA: Diagnosis not present

## 2013-02-14 DIAGNOSIS — M79609 Pain in unspecified limb: Secondary | ICD-10-CM

## 2013-02-14 DIAGNOSIS — K3189 Other diseases of stomach and duodenum: Secondary | ICD-10-CM | POA: Diagnosis not present

## 2013-02-14 DIAGNOSIS — M79674 Pain in right toe(s): Secondary | ICD-10-CM

## 2013-02-14 LAB — POCT CBC
GRANULOCYTE PERCENT: 65 % (ref 37–80)
HEMATOCRIT: 45.5 % (ref 43.5–53.7)
Hemoglobin: 14.2 g/dL (ref 14.1–18.1)
Lymph, poc: 1.9 (ref 0.6–3.4)
MCH, POC: 30.3 pg (ref 27–31.2)
MCHC: 31.2 g/dL — AB (ref 31.8–35.4)
MCV: 97 fL (ref 80–97)
MID (cbc): 0.5 (ref 0–0.9)
MPV: 8.8 fL (ref 0–99.8)
POC Granulocyte: 4.4 (ref 2–6.9)
POC LYMPH %: 27.3 % (ref 10–50)
POC MID %: 7.7 %M (ref 0–12)
Platelet Count, POC: 286 10*3/uL (ref 142–424)
RBC: 4.69 M/uL (ref 4.69–6.13)
RDW, POC: 13.8 %
WBC: 6.8 10*3/uL (ref 4.6–10.2)

## 2013-02-14 LAB — COMPLETE METABOLIC PANEL WITH GFR
ALT: 24 U/L (ref 0–53)
AST: 32 U/L (ref 0–37)
Albumin: 4.2 g/dL (ref 3.5–5.2)
Alkaline Phosphatase: 51 U/L (ref 39–117)
BILIRUBIN TOTAL: 0.8 mg/dL (ref 0.3–1.2)
BUN: 17 mg/dL (ref 6–23)
CALCIUM: 9.6 mg/dL (ref 8.4–10.5)
CHLORIDE: 105 meq/L (ref 96–112)
CO2: 28 meq/L (ref 19–32)
CREATININE: 0.89 mg/dL (ref 0.50–1.35)
GFR, EST NON AFRICAN AMERICAN: 89 mL/min
GLUCOSE: 85 mg/dL (ref 70–99)
Potassium: 4.7 mEq/L (ref 3.5–5.3)
Sodium: 139 mEq/L (ref 135–145)
Total Protein: 6.8 g/dL (ref 6.0–8.3)

## 2013-02-14 LAB — URIC ACID: Uric Acid, Serum: 6.2 mg/dL (ref 4.0–7.8)

## 2013-02-14 MED ORDER — OMEPRAZOLE 40 MG PO CPDR: 40.0000 mg | DELAYED_RELEASE_CAPSULE | Freq: Every day | ORAL | Status: DC

## 2013-02-14 NOTE — Progress Notes (Addendum)
Subjective:    Patient ID: Jonathan Garrison, male    DOB: 11-16-1945, 68 y.o.   MRN: [id number]  HPI This chart was scribed for Remo Lipps Sheika Coutts-MD, by Lovena Le Day, Scribe. This patient was seen in room 10 and the patient's care was started at 1:00 PM.  HPI Comments: Jonathan Garrison is a 68 y.o. male who presents to the [facility] complaining of indigestion, umbilical hernia and foot pain. He reports that he has not seen a doctor in about a year and a half and has several problems that have worsened over this time.  1.) Indigestion. He reports midsternal chest burning. He has not tried any medicines for this problem. He states worried that it might be angina.   2.) Right foot pain. Onset few months to a year ago; no specific injury. He reports pain makes him limp at times. Has tried no medicines for this problem. He reports in his last visit was told he had swollen ankles.  3.) Reducible umbilical hernia. Onset about a year to a year and a half ago. He reports that it doesn't bother him. He states that he is not exercising as vigorous as he used to.   4.) Chronic changes of nails. He states that his fingernails seem to be cracking. He reports that he plays bass guitar and it often makes his fingers sore. Treating w/tea tree oil and fingernail polish w/minimal relief.   He states that he has been walking more and using a bicycle and lifting weights for exercising. He retired a few years ago  Patient Active Problem List   Diagnosis Date Noted  . Depression with anxiety 08/26/2011  . Overweight (BMI 25.0-29.9) 08/26/2011  . Joint pain 08/26/2011  . 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 Leg surgery  when in 6th grade    L leg, bone spur removal (just above knee, laterally)   Family History  Problem Relation Age of Onset  . Hypertension Mother   . Diabetes Mother   . Hyperlipidemia Mother    . Hypertension Father   . Benign prostatic hyperplasia Father   . Heart disease Father   . Lichen planus Sister   . Cancer Sister     ?spot on chest?  . Diabetes Maternal Grandfather    History   Social History  . Marital Status: Divorced    Spouse Name: N/A    Number of Children: N/A  . Years of Education: N/A   Occupational History  . 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 Smoking status: Former Smoker    Types: Pipe, Cigarettes    Quit date: 01/25/1976  . Smokeless tobacco: Never Used  . Alcohol Use: Yes     Comment: socially 1-2 times per month  . Drug Use: No  . Sexual Activity: Not on file   Other Topics Concern  . Not on file   Social History Narrative  . No narrative on file   Allergies  Allergen Reactions  . Penicillins     REACTION: ?  . Sulfa Antibiotics Other (See Comments)    unknown   Results for orders placed in visit on 49/70/26  BASIC METABOLIC PANEL      Result Value Range   Sodium 140  135 - 145 mEq/L   Potassium 5.4 (*) 3.5 - 5.3 mEq/L   Chloride 105  96 - 112 mEq/L   CO2 29  19 - 32 mEq/L   Glucose, Bld 76  70 - 99 mg/dL   BUN 24 (*) 6 - 23 mg/dL   Creat 0.80  0.50 - 1.35 mg/dL   Calcium 9.6  8.4 - 10.5 mg/dL  URIC ACID      Result Value Range   Uric Acid, Serum 5.6  4.0 - 7.8 mg/dL  CBC WITH DIFFERENTIAL      Result Value Range   WBC 5.3  4.0 - 10.5 K/uL   RBC 4.34  4.22 - 5.81 MIL/uL   Hemoglobin 13.4  13.0 - 17.0 g/dL   HCT 38.6 (*) 39.0 - 52.0 %   MCV 88.9  78.0 - 100.0 fL   MCH 30.9  26.0 - 34.0 pg   MCHC 34.7  30.0 - 36.0 g/dL   RDW 13.5  11.5 - 15.5 %   Platelets 285  150 - 400 K/uL   Neutrophils Relative % 60  43 - 77 %   Neutro Abs 3.2  1.7 - 7.7 K/uL   Lymphocytes Relative 25  12 - 46 %   Lymphs Abs 1.3  0.7 - 4.0 K/uL   Monocytes Relative 10  3 - 12 %   Monocytes Absolute 0.5  0.1 - 1.0 K/uL   Eosinophils Relative 4  0 - 5 %   Eosinophils Absolute 0.2  0.0 - 0.7 K/uL    Basophils Relative 1  0 - 1 %   Basophils Absolute 0.0  0.0 - 0.1 K/uL   Smear Review Criteria for review not met    POCT URINALYSIS DIPSTICK      Result Value Range   Color, UA yellow     Clarity, UA clear     Glucose, UA neg     Bilirubin, UA neg     Ketones, UA neg     Spec Grav, UA 1.015     Blood, UA neg     pH, UA 7.5     Protein, UA trace     Urobilinogen, UA 0.2     Nitrite, UA neg     Leukocytes, UA Negative    POCT UA - MICROSCOPIC ONLY      Result Value Range   WBC, Ur, HPF, POC 0-2     RBC, urine, microscopic 0-1     Bacteria, U Microscopic neg     Mucus, UA trace     Epithelial cells, urine per micros 0-1     Crystals, Ur, HPF, POC neg     Casts, Ur, LPF, POC neg     Yeast, UA neg     Review of Systems  Constitutional: Negative for fever and chills.  Respiratory: Negative for cough and shortness of breath.   Cardiovascular: Negative for chest pain.  Gastrointestinal: Negative for abdominal pain.       Indigestion Reducible umbilical henia  Musculoskeletal: Negative for back pain.       Objective:   Physical Exam Nursing note and vitals reviewed. Constitutional: Patient is oriented to person, place, and time. Patient appears well-developed and well-nourished. No distress.  HENT: I did not see any obvious mucosal lesions of the tongue Head: Normocephalic and atraumatic.  Neck: Neck supple. No tracheal deviation present.  Cardiovascular: Normal rate, regular rhythm and normal heart sounds.   No murmur heard. Pulmonary/Chest: Effort normal and breath sounds normal. No respiratory distress. Patient has no wheezes. Patient has no rales.  Musculoskeletal: Normal range  of motion.  Neurological: Patient is alert and oriented to person, place, and time.  Skin: Skin is warm and dry.  Psychiatric: Patient has a normal mood and affect. Patient's behavior is normal.  Patient has a prominent right first MTP joint with soft tissue swelling over the ball of the  foot there is a 40 in the center of the left and an Triage Vitals: BP 142/86  Pulse 82  Temp(Src) 98.5 F (36.9 C) (Oral)  Resp 17  Ht _0  (1.88 m)  Wt 224 lb (101.606 kg)  BMI 28.75 kg/m2  SpO2 96% Results for orders placed in visit on 02/14/13  POCT CBC      Result Value Range   WBC 6.8  4.6 - 10.2 K/uL   Lymph, poc 1.9  0.6 - 3.4   POC LYMPH PERCENT 27.3  10 - 50 %L   MID (cbc) 0.5  0 - 0.9   POC MID % 7.7  0 - 12 %M   POC Granulocyte 4.4  2 - 6.9   Granulocyte percent 65.0  37 - 80 %G   RBC 4.69  4.69 - 6.13 M/uL   Hemoglobin 14.2  14.1 - 18.1 g/dL   HCT, POC 45.5  43.5 - 53.7 %   MCV 97.0  80 - 97 fL   MCH, POC 30.3  27 - 31.2 pg   MCHC 31.2 (*) 31.8 - 35.4 g/dL   RDW, POC 13.8     Platelet Count, POC 286  142 - 424 K/uL   MPV 8.8  0 - 99.8 fL  [facility] reading (PRIMARY) by  Dr.Suzan Manon is a bunion deformity of the MTP joint of the right foot. Chest x-ray shows no acute disease with normal lung markings normal cardiac silhouette EKG normal sinus rhythm  DIAGNOSTIC STUDIES: Oxygen Saturation is 96% on room air, adequate by my interpretation.    COORDINATION OF CARE: At 1255 PM Discussed treatment plan with patient which includes blood work. Patient agrees.      Assessment & Plan:  Blood work was done to include CMET uric acid. We'll treat his dyspepsia with Prilosec 40 one a day. Referral made to Dr. Doran Durand to evaluate his foot discomfort. Referral made to cardiology to evaluate his dyspepsia Duque to concerns of atypical coronary symptoms. His EKG was normal I personally performed the services described in this documentation, which was scribed in my presence. The recorded information has been reviewed and is accurate.

## 2013-02-17 ENCOUNTER — Telehealth: Payer: Self-pay | Admitting: Emergency Medicine

## 2013-02-17 NOTE — Telephone Encounter (Signed)
PT WOULD LIKE A REFILL OF HIS GENERIC ADDERALL RX.

## 2013-02-18 ENCOUNTER — Telehealth: Payer: Self-pay | Admitting: Radiology

## 2013-02-18 NOTE — Telephone Encounter (Signed)
Phone call from patient, I have advised him to see Dr Leward Quan regarding his request for Adderall. He is asking if you will fill this for him, he has d/c for a while. I advised him he should wait until after the cardiology visit. He wants to know if you will write this for him now, he has physical scheduled with you in March.

## 2013-02-18 NOTE — Telephone Encounter (Signed)
He has been referred recently to cardiology. He is due for follow up with Dr Leward Quan. Called him, advised he needs appt with Dr Leward Quan for the Adderall, left message for him to come in, also advised to call me if he has questions.

## 2013-02-18 NOTE — Telephone Encounter (Signed)
The patient needs to see a cardiologist first and be checked before this prescription can be filled

## 2013-02-19 NOTE — Telephone Encounter (Signed)
Patient understands. He has not heard back from the appointment with the cardiologist. I have provided him the number to call for the appointment. Information has been sent.

## 2013-03-13 ENCOUNTER — Encounter: Payer: Self-pay | Admitting: Cardiovascular Disease

## 2013-03-13 ENCOUNTER — Ambulatory Visit (INDEPENDENT_AMBULATORY_CARE_PROVIDER_SITE_OTHER): Payer: Medicare Other | Admitting: Cardiovascular Disease

## 2013-03-13 VITALS — BP 126/70 | HR 103 | Ht 74.0 in | Wt 222.0 lb

## 2013-03-13 DIAGNOSIS — R079 Chest pain, unspecified: Secondary | ICD-10-CM | POA: Diagnosis not present

## 2013-03-13 DIAGNOSIS — R12 Heartburn: Secondary | ICD-10-CM

## 2013-03-13 NOTE — Assessment & Plan Note (Signed)
The patient has had reflux type symptoms for decades however they've become more pronounced and frequent over the recent years. They occur more after eating and when he is recumbent. He rarely awaken her from sleep. There is a variable response to exercise. He does have a family history of heart disease with a father that had bypass surgery in his 21s. Patient is I'm going to get an exercise Myoview stress test to risk stratify him I will see him back after that for further evaluation.

## 2013-03-13 NOTE — Progress Notes (Signed)
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 He was referred by Dr. [name] at urgent care on [facility] for cardiovascular evaluation because of chest pain. His cardiovascular risk factor profile is remarkable only for family history. His father had bypass surgery in his 22s. He does not smoke. He is not diabetic, hyperlipidemic or hypertensive. He said  Reflux type symptoms for decades worse in the last year or 2. Symptoms occur with recumbency, after certain meals and occasionally with exercise. The chest pain is substernal and does not radiate.   Current Outpatient Prescriptions  Medication Sig Dispense Refill  . aspirin 81 MG tablet Take 81 mg by mouth daily.      Marland Kitchen b complex vitamins tablet Take 0.5 tablets by mouth daily.        . Biotin 5000 MCG CAPS Take 1 capsule by mouth 2 (two) times daily.      . Calcium-Magnesium-Zinc 167-83-8 MG TABS Take by mouth. 1/2 tablet daily      . chlorpheniramine (CHLOR-TRIMETON) 4 MG tablet Take 2 mg by mouth 2 (two) times daily as needed.        . Cholecalciferol (VITAMIN D3) 2000 UNITS capsule Take 2,000 Units by mouth daily.      Marland Kitchen glucosamine-chondroitin 500-400 MG tablet Take 1 tablet by mouth 2 (two) times daily.        Marland Kitchen ibuprofen (ADVIL,MOTRIN) 200 MG tablet Take 400 mg by mouth 2 (two) times daily.        . Multiple Vitamins-Minerals (MULTIVITAMIN WITH MINERALS) tablet Take 1 tablet by mouth daily.        . NON FORMULARY ALJ -herbal decongestant  Takes as needed      . Zn-Pyg Afri-Nettle-Saw Palmet (SAW PALMETTO COMPLEX PO) Take 1 tablet by mouth 2 (two) times daily.      . fluticasone (FLONASE) 50 MCG/ACT nasal spray Place 2 sprays into the nose daily.  16 g  0   No current facility-administered medications  for this visit.    Allergies  Allergen Reactions  . Penicillins     REACTION: ?  . Sulfa Antibiotics Other (See Comments)    unknown    History   Social History  . Marital Status: Divorced    Spouse Name: N/A    Number of Children: N/A  . Years of Education: N/A   Occupational History  . 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 Smoking status: Former Smoker    Types: Pipe, Cigarettes    Quit date: 01/25/1976  . Smokeless tobacco: Never Used  . Alcohol Use: Yes     Comment: socially 1-2 times per month  . Drug Use: No  . Sexual Activity: Not on file   Other Topics Concern  . Not on file   Social History Narrative  . No narrative on file     Review of Systems: General: negative for chills, fever, night sweats or weight changes.  Cardiovascular: negative for chest pain, dyspnea on exertion, edema, orthopnea, palpitations, paroxysmal nocturnal dyspnea or shortness of breath Dermatological: negative for rash Respiratory: negative for cough or wheezing Urologic: negative for hematuria Abdominal: negative for nausea, vomiting, diarrhea, bright red blood per rectum, melena, or hematemesis  Neurologic: negative for visual changes, syncope, or dizziness All other systems reviewed and are otherwise negative except as noted above.    Blood pressure 126/70, pulse 103, height 6\' 2"  (1.88 m), weight 100.699 kg (222 lb).  General appearance: alert and no distress Neck: no adenopathy, no carotid bruit, no JVD, supple, symmetrical, trachea midline and thyroid not enlarged, symmetric, no tenderness/mass/nodules Lungs: clear to auscultation bilaterally Heart: regular rate and rhythm, S1, S2 normal, no murmur, click, rub or gallop Extremities: extremities normal, atraumatic, no cyanosis or edema  EKG sinus tachycardia 103 without ST or T wave changes  ASSESSMENT AND PLAN:   Chest pain The patient has had reflux type symptoms for  decades however they've become more pronounced and frequent over the recent years. They occur more after eating and when he is recumbent. He rarely awaken her from sleep. There is a variable response to exercise. He does have a family history of heart disease with a father that had bypass surgery in his 51s. Patient is I'm going to get an exercise Myoview stress test to risk stratify him I will see him back after that for further evaluation.      [name] MD FACP,FACC,FAHA, [facility] 03/13/2013 4:30 PM

## 2013-03-13 NOTE — Patient Instructions (Signed)
  We will see you back in follow up after the test.  Dr Gwenlyn Found has ordered an exercise myoview.  Your physician has requested that you have en exercise stress myoview. For further information please visit [email or web address]. Please follow instruction sheet, as given.

## 2013-03-14 ENCOUNTER — Telehealth: Payer: Self-pay | Admitting: Cardiovascular Disease

## 2013-03-14 ENCOUNTER — Other Ambulatory Visit (HOSPITAL_COMMUNITY): Payer: Self-pay | Admitting: *Deleted

## 2013-03-14 NOTE — Telephone Encounter (Signed)
Wants results of EKG that was done on yesterday per phone call. Please Call

## 2013-03-14 NOTE — Telephone Encounter (Signed)
Returned call and pt verified x 2.  Pt informed message received and RN asked if he wanted a copy of the EKG.  Pt stated he just noticed on his discharge papers that the EKG was there, but the doctor didn't go over it w/ him.  Pt informed per OV note: EKG sinus tachycardia 103 without ST or T wave changes.  Pt informed tachycardia is b/c HR was 103 and normal is 60-100.  Pt also informed there were no changes in ST or T waves, which is good.  Pt verbalized understanding and agreed w/ plan.

## 2013-03-14 NOTE — Telephone Encounter (Signed)
Wants results of EKG that was done on yesterday per phone call. Please Call ° °

## 2013-03-14 NOTE — Telephone Encounter (Signed)
Spoke to patient. Patient has question about yesterday AVS papers. RN answered his question- concerning EKG, immunization record. Patient verbalized understanding.

## 2013-03-15 ENCOUNTER — Other Ambulatory Visit: Payer: Self-pay | Admitting: Radiology

## 2013-03-15 DIAGNOSIS — M25579 Pain in unspecified ankle and joints of unspecified foot: Secondary | ICD-10-CM

## 2013-03-19 ENCOUNTER — Encounter (HOSPITAL_COMMUNITY): Payer: Medicare Other

## 2013-03-19 ENCOUNTER — Telehealth (HOSPITAL_COMMUNITY): Payer: Self-pay | Admitting: *Deleted

## 2013-03-25 DIAGNOSIS — M7750 Other enthesopathy of unspecified foot: Secondary | ICD-10-CM | POA: Diagnosis not present

## 2013-03-25 DIAGNOSIS — M21619 Bunion of unspecified foot: Secondary | ICD-10-CM | POA: Diagnosis not present

## 2013-03-27 DIAGNOSIS — L608 Other nail disorders: Secondary | ICD-10-CM | POA: Diagnosis not present

## 2013-03-27 DIAGNOSIS — L259 Unspecified contact dermatitis, unspecified cause: Secondary | ICD-10-CM | POA: Diagnosis not present

## 2013-03-27 DIAGNOSIS — L820 Inflamed seborrheic keratosis: Secondary | ICD-10-CM | POA: Diagnosis not present

## 2013-03-27 DIAGNOSIS — D485 Neoplasm of uncertain behavior of skin: Secondary | ICD-10-CM | POA: Diagnosis not present

## 2013-04-02 DIAGNOSIS — H40019 Open angle with borderline findings, low risk, unspecified eye: Secondary | ICD-10-CM | POA: Diagnosis not present

## 2013-04-05 ENCOUNTER — Telehealth (HOSPITAL_COMMUNITY): Payer: Self-pay

## 2013-04-09 ENCOUNTER — Encounter: Payer: Self-pay | Admitting: Emergency Medicine

## 2013-04-09 ENCOUNTER — Ambulatory Visit (INDEPENDENT_AMBULATORY_CARE_PROVIDER_SITE_OTHER): Payer: Medicare Other | Admitting: Emergency Medicine

## 2013-04-09 VITALS — BP 124/78 | HR 84 | Temp 98.2°F | Resp 16 | Ht 74.0 in | Wt 217.0 lb

## 2013-04-09 DIAGNOSIS — K3189 Other diseases of stomach and duodenum: Secondary | ICD-10-CM | POA: Diagnosis not present

## 2013-04-09 DIAGNOSIS — C069 Malignant neoplasm of mouth, unspecified: Secondary | ICD-10-CM

## 2013-04-09 DIAGNOSIS — R1013 Epigastric pain: Secondary | ICD-10-CM

## 2013-04-09 DIAGNOSIS — E663 Overweight: Secondary | ICD-10-CM

## 2013-04-09 DIAGNOSIS — Z0000 Encounter for general adult medical examination without abnormal findings: Secondary | ICD-10-CM

## 2013-04-09 DIAGNOSIS — Z125 Encounter for screening for malignant neoplasm of prostate: Secondary | ICD-10-CM

## 2013-04-09 DIAGNOSIS — K429 Umbilical hernia without obstruction or gangrene: Secondary | ICD-10-CM | POA: Diagnosis not present

## 2013-04-09 LAB — POCT URINALYSIS DIPSTICK
Bilirubin, UA: NEGATIVE
Bilirubin, UA: NEGATIVE
Blood, UA: NEGATIVE
Blood, UA: NEGATIVE
Glucose, UA: NEGATIVE
Glucose, UA: NEGATIVE
Ketones, UA: NEGATIVE
Ketones, UA: NEGATIVE
Leukocytes, UA: NEGATIVE
Leukocytes, UA: NEGATIVE
Nitrite, UA: NEGATIVE
Nitrite, UA: NEGATIVE
PH UA: 5.5
PH UA: 5.5
PROTEIN UA: NEGATIVE
Protein, UA: NEGATIVE
SPEC GRAV UA: 1.02
Spec Grav, UA: 1.02
UROBILINOGEN UA: 0.2
Urobilinogen, UA: 0.2

## 2013-04-09 LAB — CBC WITH DIFFERENTIAL/PLATELET
Basophils Absolute: 0 10*3/uL (ref 0.0–0.1)
Basophils Relative: 1 % (ref 0–1)
Eosinophils Absolute: 0.3 10*3/uL (ref 0.0–0.7)
Eosinophils Relative: 6 % — ABNORMAL HIGH (ref 0–5)
HEMATOCRIT: 39.9 % (ref 39.0–52.0)
HEMOGLOBIN: 13.6 g/dL (ref 13.0–17.0)
LYMPHS ABS: 1.2 10*3/uL (ref 0.7–4.0)
LYMPHS PCT: 26 % (ref 12–46)
MCH: 29.4 pg (ref 26.0–34.0)
MCHC: 34.1 g/dL (ref 30.0–36.0)
MCV: 86.4 fL (ref 78.0–100.0)
MONO ABS: 0.5 10*3/uL (ref 0.1–1.0)
MONOS PCT: 10 % (ref 3–12)
NEUTROS PCT: 57 % (ref 43–77)
Neutro Abs: 2.7 10*3/uL (ref 1.7–7.7)
Platelets: 286 10*3/uL (ref 150–400)
RBC: 4.62 MIL/uL (ref 4.22–5.81)
RDW: 14 % (ref 11.5–15.5)
WBC: 4.7 10*3/uL (ref 4.0–10.5)

## 2013-04-09 LAB — IFOBT (OCCULT BLOOD): IFOBT: NEGATIVE

## 2013-04-09 NOTE — Progress Notes (Addendum)
This chart was scribed for Jonathan Russian, MD by Jonathan Jonathan Garrison, ED Scribe. This patient was seen in room Jonathan Jonathan Garrison and the patient's care was started at 2:13 PM. Subjective:    Patient ID: Jonathan Jonathan Garrison, male    DOB: 11-11-1945, 68 y.o.   MRN: [id number]  HPI Jonathan Jonathan Garrison is a 68 y.o. male who presents to the [facility] for F/U apt. Pt states he is concerned about his umbilical hernia and is wondering "if anything should be going on". He c/o of having joint pain. Pt states he was seen by an orthopedist and reports having "trouble with R" foot. He states he was prescribed Moloxican 15 mg by Jonathan Jonathan Garrison. Pt states he has been taking Jonathan Garrison a tablet in the morning and Jonathan Garrison the tablet in the evening. Pt states when taking Moloxican, he has relief to lower back and neck. He reports walking to aid with pain.  Pt states he is being seen by cardiologist tomorrow evening for a stress test and will F/U next week. He states he will be speaking with his dental oncologist. He reports smoking Jonathan Garrison cigarettes when he was in his 20's and denies any other tobacco use. Pt is concerned of esophageal cancer. He reports having an area that causes him to have a challenge in swallowing but states this is "slgihtly normal".  Pt reports losing 10 lbs from monitoring his food intake and reports being physically involved at the Jonathan Jonathan Garrison. He reports not taking his GERD medication due to research he did. Pt states he is taking OTC tums instead PRN. Pt states he has spoken with his dermatologist and he has indicated his scalp does not determine any cancer.  Pt states he gets "a pinch dizzy" but denies dizziness at this moment. Pt states he decided to stop taking some of his medications due to managing his health better.  Review of Systems  Constitutional: Positive for unexpected weight change. Negative for appetite change.  Neurological: Negative for dizziness.   Objective:   Physical Exam CONSTITUTIONAL: Well developed/well  nourished HEAD: Normocephalic/atraumatic EYES: EOMI/PERRL ENMT: Mucous membranes moist NECK: supple no meningeal signs SPINE:entire spine nontender CV: S1/S2 noted, no murmurs/rubs/gallops noted LUNGS: Lungs are clear to auscultation bilaterally, no apparent distress ABDOMEN: soft, nontender, no rebound or guarding. 1 cm umbilical hernia. GU:no cva tenderness prostate is normal there's no nodularity. NEURO: Pt is awake/alert, moves all extremitiesx4 EXTREMITIES: pulses normal, full ROM SKIN: warm, color normal. Red rash to scrotum. PSYCH: no abnormalities of mood noted Results for orders placed in visit on 04/09/13  POCT URINALYSIS DIPSTICK      Result Value Ref Range   Color, UA yellow     Clarity, UA clear     Glucose, UA neg     Bilirubin, UA neg     Ketones, UA neg     Spec Grav, UA 1.020     Blood, UA neg     pH, UA 5.5     Protein, UA neg     Urobilinogen, UA 0.2     Nitrite, UA neg     Leukocytes, UA Negative    POCT URINALYSIS DIPSTICK      Result Value Ref Range   Color, UA yellow     Clarity, UA clear     Glucose, UA neg     Bilirubin, UA neg     Ketones, UA neg     Spec Grav, UA 1.020     Blood, UA neg  pH, UA 5.5     Protein, UA neg     Urobilinogen, UA 0.2     Nitrite, UA neg     Leukocytes, UA Negative    IFOBT (OCCULT BLOOD)      Result Value Ref Range   IFOBT Negative     Triage Vitals:BP 124/78  Pulse 84  Temp(Src) 98.2 F (36.8 C)  Resp 16  Ht 6\' 2"  (1.88 m)  Wt 217 lb (98.431 kg)  BMI 27.85 kg/m2  SpO2 97% Assessment & Plan:  I personally performed the services described in this documentation, which was scribed in my presence. The recorded information has been reviewed and is accurate Made to GI so they can consider endoscopy. He did not take his PPI takes TUMS instead. He currently proceeding with his cardiology evaluation. Referral made to Gen. surgery to evaluate his umbilical hernia. He recently has been having some male difficulty.  I advised him there is a dermatologist they could see him who specializes in nails and he will let me know if he wants to see Jonathan Jonathan Garrison. He is going to return sometime this month for his pneumovax

## 2013-04-10 ENCOUNTER — Ambulatory Visit (HOSPITAL_COMMUNITY)
Admission: RE | Admit: 2013-04-10 | Discharge: 2013-04-10 | Disposition: A | Discharge status: Home / Self Care | Payer: Medicare Other | Source: Ambulatory Visit | Attending: Cardiovascular Disease | Admitting: Cardiovascular Disease

## 2013-04-10 DIAGNOSIS — R079 Chest pain, unspecified: Secondary | ICD-10-CM | POA: Insufficient documentation

## 2013-04-10 DIAGNOSIS — R12 Heartburn: Secondary | ICD-10-CM | POA: Diagnosis not present

## 2013-04-10 DIAGNOSIS — K219 Gastro-esophageal reflux disease without esophagitis: Secondary | ICD-10-CM | POA: Diagnosis not present

## 2013-04-10 LAB — LIPID PANEL
Cholesterol: 194 mg/dL (ref 0–200)
HDL: 74 mg/dL (ref >39)
LDL CALC: 112 mg/dL — AB (ref 0–99)
Total CHOL/HDL Ratio: 2.6 Ratio
Triglycerides: 41 mg/dL (ref <150)
VLDL: 8 mg/dL (ref 0–40)

## 2013-04-10 LAB — COMPLETE METABOLIC PANEL WITH GFR
ALK PHOS: 42 U/L (ref 39–117)
ALT: 26 U/L (ref 0–53)
AST: 34 U/L (ref 0–37)
Albumin: 4.3 g/dL (ref 3.5–5.2)
BILIRUBIN TOTAL: 0.9 mg/dL (ref 0.2–1.2)
BUN: 14 mg/dL (ref 6–23)
CO2: 27 mEq/L (ref 19–32)
CREATININE: 0.77 mg/dL (ref 0.50–1.35)
Calcium: 9.2 mg/dL (ref 8.4–10.5)
Chloride: 104 mEq/L (ref 96–112)
GLUCOSE: 82 mg/dL (ref 70–99)
Potassium: 4.2 mEq/L (ref 3.5–5.3)
Sodium: 138 mEq/L (ref 135–145)
TOTAL PROTEIN: 6.6 g/dL (ref 6.0–8.3)

## 2013-04-10 LAB — PSA, MEDICARE: PSA: 1.09 ng/mL (ref <=4.00)

## 2013-04-10 MED ORDER — TECHNETIUM TC 99M SESTAMIBI GENERIC - CARDIOLITE
10.0000 | Freq: Once | INTRAVENOUS | Status: AC | PRN
  Administered 2013-04-10: 10 via INTRAVENOUS

## 2013-04-10 MED ORDER — TECHNETIUM TC 99M SESTAMIBI GENERIC - CARDIOLITE
30.0000 | Freq: Once | INTRAVENOUS | Status: AC | PRN
  Administered 2013-04-10: 30 via INTRAVENOUS

## 2013-04-10 NOTE — Procedures (Addendum)
[facility] [address] [place] [address] [phone]  Cardiology Nuclear Med Study  Jonathan Garrison is a 68 y.o. male     MRN : [id number]     DOB: 05/24/1945  Procedure Date: 04/10/2013  Nuclear Med Background Indication for Stress Test:  Evaluation for Ischemia History:  No prior cardiac or respiratory history reported. Cardiac Risk Factors: Family History - CAD, History of Smoking and Overweight  Symptoms:  Chest Pain, Dizziness, DOE, Fatigue, Light-Headedness and Palpitations   Nuclear Pre-Procedure Caffeine/Decaff Intake:  9:00pm NPO After: 7:00am   IV Site: R Forearm  IV 0.9% NS with Angio Cath:  22g  Chest Size (in):  44"  IV Started by: Azucena Cecil, RN  Height: 6\' 2"  (1.88 m)  Cup Size: n/a  BMI:  Body mass index is 28.49 kg/(m^2). Weight:  222 lb (100.699 kg)   Tech Comments:  n/a    Nuclear Med Study 1 or 2 day study: 1 day  Stress Test Type:  Stress  Order Authorizing Provider:  Quay Burow, MD   Resting Radionuclide: Technetium 10m Sestamibi  Resting Radionuclide Dose: 10.8 mCi   Stress Radionuclide:  Technetium 18m Sestamibi  Stress Radionuclide Dose: 29.7 mCi           Stress Protocol Rest HR: 74 Stress HR: 160  Rest BP: 136/78 Stress BP: 171/90  Exercise Time (min): 6 METS: 7.   Predicted Max HR: 154 bpm % Max HR: 103.9 bpm Rate Pressure Product: 29600  Dose of Adenosine (mg):  n/a Dose of Lexiscan: n/a mg  Dose of Atropine (mg): n/a Dose of Dobutamine: n/a mcg/kg/min (at max HR)  Stress Test Technologist: Leane Para, CCT Nuclear Technologist: Otho Perl, CNMT   Rest Procedure:  Myocardial perfusion imaging was performed at rest 45 minutes following the intravenous administration of Technetium 66m Sestamibi. Stress Procedure:  The patient performed treadmill exercise using a Bruce  Protocol for 6 minutes. The patient stopped due to SOB, fatigue and ST changes and denied any  chest pain.  There were  significant ST-T wave changes.  Technetium 57m Sestamibi was injected at peak exercise and myocardial perfusion imaging was performed after a brief delay.  Transient Ischemic Dilatation (Normal <1.22):  1.01 Lung/Heart Ratio (Normal <0.45):  0.27 QGS EDV:  90 ml QGS ESV:  34 ml LV Ejection Fraction: 62%       Rest ECG: NSR - Normal EKG  Stress ECG: Significant ST abnormalities consistent with ischemia.  QPS Raw Data Images:  Normal; no motion artifact; normal heart/lung ratio. Stress Images:  There is decreased uptake in the inferior wall. Rest Images:  There is decreased uptake in the inferior wall. Subtraction (SDS):  There is a fixed inferior defect that is most consistent with diaphragmatic attenuation.  Impression Exercise Capacity:  Good exercise capacity. BP Response:  Normal blood pressure response. Clinical Symptoms:  No significant symptoms noted. ECG Impression:  Significant ST abnormalities consistent with ischemia. Comparison with Prior Nuclear Study: No previous nuclear study performed  Overall Impression:  Low risk stress nuclear study Diaphragmatic attenuation.  LV Wall Motion:  NL LV Function; NL Wall Motion   [name], MD  04/10/2013 1:47 PM      Rest ECG: paced rhythm  Stress ECG: No significant change from baseline ECG  QPS Raw Data Images:  Normal; no motion artifact; normal heart/lung ratio. Stress Images:  LV dilatation with apical thinning Rest Images:  LV dilatation with inferior  apical/lateral thinning Subtraction (SDS):  No evidence of ischemia.  Impression Exercise Capacity:  Lexiscan with no exercise. BP Response:  Normal blood pressure response. Clinical Symptoms:  No significant symptoms noted. ECG Impression:  No significant ST segment change suggestive of ischemia. 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  LV Wall Motion:  Gating not performed secondary to ectopy   [name], MD  04/10/2013 1:50 PM

## 2013-04-11 DIAGNOSIS — D101 Benign neoplasm of tongue: Secondary | ICD-10-CM | POA: Diagnosis not present

## 2013-04-16 DIAGNOSIS — K1329 Other disturbances of oral epithelium, including tongue: Secondary | ICD-10-CM | POA: Diagnosis not present

## 2013-04-17 ENCOUNTER — Ambulatory Visit (INDEPENDENT_AMBULATORY_CARE_PROVIDER_SITE_OTHER): Payer: Medicare Other | Admitting: Cardiovascular Disease

## 2013-04-17 ENCOUNTER — Encounter: Payer: Self-pay | Admitting: Cardiovascular Disease

## 2013-04-17 VITALS — BP 120/82 | HR 104 | Ht 74.0 in | Wt 217.5 lb

## 2013-04-17 DIAGNOSIS — R079 Chest pain, unspecified: Secondary | ICD-10-CM

## 2013-04-17 NOTE — Progress Notes (Signed)
04/17/2013 Josefa Half   1946/01/09  [id number]  Primary Physician Jenny Reichmann, MD Primary Cardiologist: [name] MD Renae Gloss   HPI:  Mr. 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 He was referred by Dr. [name] at urgent care on [facility] for cardiovascular evaluation because of chest pain. His cardiovascular risk factor profile is remarkable only for family history. His father had bypass surgery in his 26s. He does not smoke. He is not diabetic, hyperlipidemic or hypertensive. He said Reflux type symptoms for decades worse in the last year or 2. Symptoms occur with recumbency, after certain meals and occasionally with exercise. The chest pain is substernal and does not radiate. A Myoview stress test was performed 04/10/13 which was low risk, nonischemic with normal wall motion.    Current Outpatient Prescriptions  Medication Sig Dispense Refill  . aspirin 81 MG tablet Take 81 mg by mouth daily.      Marland Kitchen b complex vitamins tablet Take 0.5 tablets by mouth daily.        . Biotin 5000 MCG CAPS Take 1 capsule by mouth 2 (two) times daily.      . Calcium-Magnesium-Zinc 167-83-8 MG TABS Take by mouth. 1/2 tablet daily      . chlorpheniramine (CHLOR-TRIMETON) 4 MG tablet Take 2 mg by mouth 2 (two) times daily as needed.        . Cholecalciferol (VITAMIN D3) 2000 UNITS capsule Take 2,000 Units by mouth daily.      Marland Kitchen glucosamine-chondroitin 500-400 MG tablet Take 1 tablet by mouth 2 (two) times daily.        . meloxicam (MOBIC) 15 MG tablet Take 1 tablet by mouth 2 (two) times daily.      . Multiple Vitamins-Minerals (MULTIVITAMIN WITH MINERALS) tablet Take 1 tablet by mouth daily.        . NON FORMULARY ALJ -herbal decongestant  Takes as needed      . Zn-Pyg Afri-Nettle-Saw Palmet (SAW PALMETTO COMPLEX PO) Take 1 tablet by mouth 2 (two) times daily.      . fluticasone (FLONASE) 50 MCG/ACT nasal spray  Place 2 sprays into the nose daily.  16 g  0   No current facility-administered medications for this visit.    Allergies  Allergen Reactions  . Penicillins     REACTION: ?  . Sulfa Antibiotics Other (See Comments)    unknown    History   Social History  . Marital Status: Divorced    Spouse Name: N/A    Number of Children: N/A  . Years of Education: N/A   Occupational History  . 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 Smoking status: Former Smoker    Types: Pipe, Cigarettes    Quit date: 01/25/1976  . Smokeless tobacco: Never Used  . Alcohol Use: Yes     Comment: socially 1-2 times per month  . Drug Use: No  . Sexual Activity: Not on file   Other Topics Concern  . Not on file   Social History Narrative  . No narrative on file     Review of Systems: General: negative for chills, fever, night sweats or weight changes.  Cardiovascular: negative for chest pain, dyspnea on exertion, edema, orthopnea, palpitations, paroxysmal nocturnal dyspnea or shortness of breath Dermatological: negative for rash Respiratory: negative for cough or wheezing Urologic: negative for hematuria Abdominal:  negative for nausea, vomiting, diarrhea, bright red blood per rectum, melena, or hematemesis Neurologic: negative for visual changes, syncope, or dizziness All other systems reviewed and are otherwise negative except as noted above.    Blood pressure 120/82, pulse 104, height 6\' 2"  (1.88 m), weight 217 lb 8 oz (98.657 kg).  General appearance: alert and no distress Neck: no adenopathy, no carotid bruit, no JVD, supple, symmetrical, trachea midline and thyroid not enlarged, symmetric, no tenderness/mass/nodules Lungs: clear to auscultation bilaterally Heart: regular rate and rhythm, S1, S2 normal, no murmur, click, rub or gallop Extremities: extremities normal, atraumatic, no cyanosis or edema  EKG not performed today  ASSESSMENT AND PLAN:    Chest pain The patient had a Myoview stress test which was normal with diaphragmatic attenuation and normal LV function. I suspect his chest pain is related to GERD. There is no medical reason why he cannot be put back on his Adderall from a heart point of view.      [name] MD FACP,FACC,FAHA, [facility] 04/17/2013 2:44 PM

## 2013-04-17 NOTE — Assessment & Plan Note (Signed)
The patient had a Myoview stress test which was normal with diaphragmatic attenuation and normal LV function. I suspect his chest pain is related to GERD. There is no medical reason why he cannot be put back on his Adderall from a heart point of view.

## 2013-04-17 NOTE — Patient Instructions (Signed)
Your physician recommends that you schedule a follow-up appointment in: As Needed    

## 2013-04-18 ENCOUNTER — Ambulatory Visit (INDEPENDENT_AMBULATORY_CARE_PROVIDER_SITE_OTHER): Payer: Medicare Other | Admitting: Internal Medicine

## 2013-04-18 ENCOUNTER — Encounter: Payer: Self-pay | Admitting: Internal Medicine

## 2013-04-18 VITALS — BP 116/74 | HR 72 | Ht 73.5 in | Wt 220.5 lb

## 2013-04-18 DIAGNOSIS — K219 Gastro-esophageal reflux disease without esophagitis: Secondary | ICD-10-CM | POA: Diagnosis not present

## 2013-04-18 DIAGNOSIS — R1310 Dysphagia, unspecified: Secondary | ICD-10-CM

## 2013-04-18 NOTE — Patient Instructions (Signed)

## 2013-04-18 NOTE — Progress Notes (Signed)
HISTORY OF PRESENT ILLNESS:  Jonathan Garrison is a 68 y.o. male with the below listed medical history who presents today regarding reflux. Patient was seen as a direct referral for screening colonoscopy February 2012. Examination was normal. He presents now with several month history of heartburn. Self medicates with antacids. Some intermittent solid food dysphagia. Seen by his PCP. Omeprazole recommended. Patient reluctant. Sent to cardiologist to rule out anginal equivalent. Negative cardiac workup (reviewed workup and office note). Any other GI complaints chronic stable constipation.  REVIEW OF SYSTEMS:  All non-GI ROS negative except for sinus and allergy, arthritis, back pain, cough, fatigue, tinnitus, muscle pains, skin rash, ankle swelling, increased urination, urinary leakage  Past Medical History  Diagnosis Date  . ADD (attention deficit disorder) without hyperactivity   . 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 Eczema   . Lichen planus   . Allergy   . Polio age 42  . Arthralgia     many joints, managed with vitamins/herbs and ibuprofen  . Colitis 1986    single attack (believes was ulcerative)  . Pleurisy 1984  . Chest pain   . Umbilical hernia     Past Surgical History  Procedure Laterality Date  . Leg surgery  when in 6th grade    L leg, bone spur removal (just above knee, laterally)  . Tongue surgery      for tongue cancer    Social History Jonathan Garrison  reports that he quit smoking about 37 years ago. His smoking use included Pipe and Cigarettes. He smoked 0.00 packs per day. He has never used smokeless tobacco. He reports that he drinks alcohol. He reports that he does not use illicit drugs.  family history includes Benign prostatic hyperplasia in his father; Cancer in his sister; Diabetes in his maternal grandfather and mother; Heart disease in his father; Hyperlipidemia in his mother; Hypertension in his father and mother; Lichen planus in his  sister.  Allergies  Allergen Reactions  . Penicillins     REACTION: ?  . Sulfa Antibiotics Other (See Comments)    unknown       PHYSICAL EXAMINATION: Vital signs: BP 116/74  Pulse 72  Ht 6' 1.5" (1.867 m)  Wt 220 lb 8 oz (100.018 kg)  BMI 28.69 kg/m2 General: Well-developed, well-nourished, no acute distress HEENT: Sclerae are anicteric, conjunctiva pink. Oral mucosa intact Lungs: Clear Heart: Regular Abdomen: soft, nontender, nondistended, no obvious ascites, no peritoneal signs, normal bowel sounds. No organomegaly. Extremities: No edema Psychiatric: alert and oriented x3. Cooperative     ASSESSMENT:  #1. GERD with intermittent dysphagia. Rule out peptic stricture #2. Negative screening colonoscopy 2012  PLAN:  #1. Reflux precautions #2. May need H2 receptor antagonist or PPI #3. Upper endoscopy with possible esophageal dilation.The nature of the procedure, as well as the risks, benefits, and alternatives were carefully and thoroughly reviewed with the patient. Ample time for discussion and questions allowed. The patient understood, was satisfied, and agreed to proceed. #4. Routine repeat screening colonoscopy around 2022

## 2013-04-22 ENCOUNTER — Ambulatory Visit (INDEPENDENT_AMBULATORY_CARE_PROVIDER_SITE_OTHER): Payer: Medicare Other | Admitting: General Surgery

## 2013-04-22 ENCOUNTER — Encounter (INDEPENDENT_AMBULATORY_CARE_PROVIDER_SITE_OTHER): Payer: Self-pay | Admitting: General Surgery

## 2013-04-22 VITALS — BP 148/88 | HR 82 | Temp 99.0°F | Resp 16 | Ht 74.0 in | Wt 218.6 lb

## 2013-04-22 DIAGNOSIS — K421 Umbilical hernia with gangrene: Secondary | ICD-10-CM

## 2013-04-22 NOTE — Progress Notes (Signed)
Patient ID: Jonathan Garrison, male   DOB: 1945-02-15, 68 y.o.   MRN: [id number]  Chief Complaint  Patient presents with  . New Evaluation    eval umb hernia    HPI Jonathan Garrison is a 68 y.o. male.  The patient is a 68 year old male who is referred by Dr. [name] evaluation umbilical hernia. Patient states his medicine for approximately 6 months. Patient has been working out more both aerobic and doing crunches to lose weight. The patient has no pain from the area to reduce the hernia himself. The patient has a glucose he uses when working out. The patient has no signs or symptoms of incarceration or strangulation  HPI  Past Medical History  Diagnosis Date  . ADD (attention deficit disorder) without hyperactivity   . 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 Eczema   . Lichen planus   . Allergy   . Polio age 83  . Arthralgia     many joints, managed with vitamins/herbs and ibuprofen  . Colitis 1986    single attack (believes was ulcerative)  . Pleurisy 1984  . Chest pain   . Umbilical hernia     Past Surgical History  Procedure Laterality Date  . Leg surgery  when in 6th grade    L leg, bone spur removal (just above knee, laterally)  . Tongue surgery      for tongue cancer    Family History  Problem Relation Age of Onset  . Hypertension Mother   . Diabetes Mother   . Hyperlipidemia Mother   . Hypertension Father   . Benign prostatic hyperplasia Father   . Heart disease Father   . Lichen planus Sister   . Cancer Sister     ?spot on chest?  . Diabetes Maternal Grandfather     Social History History  Substance Use Topics  . Smoking status: Former Smoker    Types: Pipe, Cigarettes    Quit date: 01/25/1976  . Smokeless tobacco: Never Used  . Alcohol Use: Yes     Comment: socially 1-2 times per month    Allergies  Allergen Reactions  . Penicillins     REACTION: ?  . Sulfa Antibiotics Other (See Comments)    unknown    Current Outpatient Prescriptions    Medication Sig Dispense Refill  . aspirin 81 MG tablet Take 81 mg by mouth daily.      Marland Kitchen b complex vitamins tablet Take 0.5 tablets by mouth daily.        . Biotin 5000 MCG CAPS Take 1 capsule by mouth 2 (two) times daily.      . Calcium-Magnesium-Zinc 167-83-8 MG TABS Take by mouth. 1/2 tablet daily      . chlorpheniramine (CHLOR-TRIMETON) 4 MG tablet Take 2 mg by mouth 2 (two) times daily as needed.        . Cholecalciferol (VITAMIN D3) 2000 UNITS capsule Take 2,000 Units by mouth daily.      Marland Kitchen glucosamine-chondroitin 500-400 MG tablet Take 1 tablet by mouth 2 (two) times daily.        . meloxicam (MOBIC) 15 MG tablet Take 0.5 tablets by mouth 2 (two) times daily.       . Multiple Vitamins-Minerals (MULTIVITAMIN WITH MINERALS) tablet Take 1 tablet by mouth daily.        . NON FORMULARY ALJ -herbal decongestant  Takes as needed      . Zn-Pyg Afri-Nettle-Saw Palmet (SAW PALMETTO COMPLEX PO) Take  1 tablet by mouth 2 (two) times daily.       No current facility-administered medications for this visit.    Review of Systems Review of Systems  Constitutional: Negative.   HENT: Negative.   Eyes: Negative.   Respiratory: Negative.   Cardiovascular: Negative.   Gastrointestinal: Negative.   Endocrine: Negative.   Neurological: Negative.     Blood pressure 148/88, pulse 82, temperature 99 F (37.2 C), temperature source Temporal, resp. rate 16, height 6\' 2"  (1.88 m), weight 218 lb 9.6 oz (99.156 kg).  Physical Exam Physical Exam  Constitutional: He is oriented to person, place, and time. He appears well-developed and well-nourished.  HENT:  Head: Normocephalic and atraumatic.  Eyes: Conjunctivae and EOM are normal. Pupils are equal, round, and reactive to light.  Neck: Normal range of motion. Neck supple.  Cardiovascular: Normal rate, regular rhythm and normal heart sounds.   Pulmonary/Chest: Effort normal and breath sounds normal.  Abdominal: Soft. Bowel sounds are normal. He  exhibits no distension and no mass. There is no tenderness. There is no rebound and no guarding. A hernia is present. Hernia confirmed positive in the ventral area.    Musculoskeletal: Normal range of motion.  Neurological: He is alert and oriented to person, place, and time.  Skin: Skin is warm and dry.    Data Reviewed number  Assessment    68 year old male with a reducible umbilical hernia     Plan    1. The patient would like to observe at this time. He would like to continue losing weight which I stated would only help decrease the chance of recurrence after repair. He presents at this time with observation. 2. At that time the hernia becomes worrisome and/or symptomatic he can call us back we this laparoscopic  Hernia repair. 3. The patient in followup as needed.         Rosario Jacks., Erikson Danzy 04/22/2013, 1:56 PM

## 2013-04-23 ENCOUNTER — Encounter: Payer: Self-pay | Admitting: Internal Medicine

## 2013-05-08 DIAGNOSIS — M542 Cervicalgia: Secondary | ICD-10-CM | POA: Diagnosis not present

## 2013-05-08 DIAGNOSIS — M25579 Pain in unspecified ankle and joints of unspecified foot: Secondary | ICD-10-CM | POA: Diagnosis not present

## 2013-05-08 DIAGNOSIS — M5126 Other intervertebral disc displacement, lumbar region: Secondary | ICD-10-CM | POA: Diagnosis not present

## 2013-05-09 ENCOUNTER — Telehealth: Payer: Self-pay

## 2013-05-09 NOTE — Telephone Encounter (Signed)
Pt is going out of town tomorrow night

## 2013-05-09 NOTE — Telephone Encounter (Signed)
Pt is needing a refill on adderall and generic flonase   Best number [phone]

## 2013-05-10 MED ORDER — FLUTICASONE PROPIONATE 50 MCG/ACT NA SUSP: 2.0000 | Freq: Every day | NASAL | Status: DC

## 2013-05-10 NOTE — Telephone Encounter (Signed)
Sent RX to [facility]

## 2013-05-10 NOTE — Telephone Encounter (Signed)
Advised patient that Dr. [name] would like to discuss doses and side effects of Adderall with patient before prescribing.  Told him that Flonase would be sent to the pharmacy.  He verbalized understanding.    Patient requests that flonase be sent to [facility] at [place] and [address].

## 2013-05-10 NOTE — Telephone Encounter (Signed)
Please call the patient and tell him I would like to sit down and talk with him before we just start him back on Adderall.. I. Need to go over the dose and side effects and discuss it. I do not want to just call it in. We can go ahead and call in Flonase. When he gets back from his trip I will be happy to sit down with him and talk to him about the Adderall.

## 2013-05-23 ENCOUNTER — Encounter: Payer: Medicare Other | Admitting: Internal Medicine

## 2013-06-06 ENCOUNTER — Ambulatory Visit (AMBULATORY_SURGERY_CENTER): Payer: Medicare Other | Admitting: Internal Medicine

## 2013-06-06 ENCOUNTER — Encounter: Payer: Self-pay | Admitting: Internal Medicine

## 2013-06-06 ENCOUNTER — Telehealth: Payer: Self-pay | Admitting: Internal Medicine

## 2013-06-06 VITALS — BP 124/75 | HR 75 | Temp 97.0°F | Resp 33 | Ht 73.5 in | Wt 220.0 lb

## 2013-06-06 DIAGNOSIS — K219 Gastro-esophageal reflux disease without esophagitis: Secondary | ICD-10-CM

## 2013-06-06 DIAGNOSIS — K2100 Gastro-esophageal reflux disease with esophagitis, without bleeding: Secondary | ICD-10-CM

## 2013-06-06 DIAGNOSIS — E669 Obesity, unspecified: Secondary | ICD-10-CM | POA: Diagnosis not present

## 2013-06-06 DIAGNOSIS — F988 Other specified behavioral and emotional disorders with onset usually occurring in childhood and adolescence: Secondary | ICD-10-CM | POA: Diagnosis not present

## 2013-06-06 DIAGNOSIS — K222 Esophageal obstruction: Secondary | ICD-10-CM

## 2013-06-06 DIAGNOSIS — F341 Dysthymic disorder: Secondary | ICD-10-CM | POA: Diagnosis not present

## 2013-06-06 MED ORDER — OMEPRAZOLE 40 MG PO CPDR: 40.0000 mg | DELAYED_RELEASE_CAPSULE | Freq: Every day | ORAL | Status: DC

## 2013-06-06 MED ORDER — SODIUM CHLORIDE 0.9 % IV SOLN: 500.0000 mL | INTRAVENOUS | Status: DC

## 2013-06-06 NOTE — Patient Instructions (Signed)

## 2013-06-06 NOTE — Progress Notes (Signed)
PT. Called in earlier   to inform us that he ate breakfast at 9:30, after getting here and questioning him he stated that it was closer to 10:00 when he finished eating, made CRNA aware and [name] was informed via CRNA. CRNA stated it will be ok.

## 2013-06-06 NOTE — Op Note (Signed)
[place]  [address] [place], [address]   ENDOSCOPY PROCEDURE REPORT  PATIENT: Jonathan, Garrison  MR#: [id number] BIRTHDATE: 09-Sep-1945 , 21  yrs. old GENDER: Male ENDOSCOPIST: Eustace Quail, MD REFERRED BY:  [name], M.D. PROCEDURE DATE:  06/06/2013 PROCEDURE:  EGD, diagnostic ASA CLASS:     Class II INDICATIONS:  History of esophageal reflux.   Dysphagia. MEDICATIONS: MAC sedation, administered by CRNA and propofol (Diprivan) 150mg  IV TOPICAL ANESTHETIC: none  DESCRIPTION OF PROCEDURE: After the risks benefits and alternatives of the procedure were thoroughly explained, informed consent was obtained.  The LB [id number] endoscope was introduced through the mouth and advanced to the second portion of the duodenum. Without limitations.  The instrument was slowly withdrawn as the mucosa was fully examined.      EXAM:The esophagus revealed severe ulcerative esophagitis with scarring and stricture in the distal 5 cm.  The stomach was normal. The duodenum was normal.  Retroflexed views revealed a hiatal hernia.     The scope was then withdrawn from the patient and the procedure completed.  COMPLICATIONS: There were no complications. ENDOSCOPIC IMPRESSION: 1. 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 Esophageal stricture.  RECOMMENDATIONS: 1.  Prescribe omeprazole 40 mg by mouth daily; #30; 11 refills 2.  Call for office visit to be seen by [name] in 6weeks  REPEAT EXAM:  eSigned:  Eustace Quail, MD 06/06/2013 4:00 PM   IR:[id number] [name], MD and The Patient

## 2013-06-06 NOTE — Telephone Encounter (Signed)
Call placed to pt. And he informed me that he ate breakfast this morning at 9:30 shreeded wheat milk blueberries, banana, raspberries, coffee and juice and then at 1100 this morning he had coffee with milk. Instructed pt. Not to take anything else via mouth. Spoke with [name] CRNA for room and he stated that the procedure can still be done. Call placed back to pt. To inform him that we can still do procedure and for him to remain without anything else by mouth, pt. Verbalize understanding. [name] will be informed.

## 2013-06-06 NOTE — Progress Notes (Signed)
Report to pacu rn, vss, bbs=clear 

## 2013-06-07 ENCOUNTER — Telehealth: Payer: Self-pay | Admitting: *Deleted

## 2013-06-07 NOTE — Telephone Encounter (Signed)
No answer, left message to call if questions or concerns. 

## 2013-07-18 ENCOUNTER — Ambulatory Visit (INDEPENDENT_AMBULATORY_CARE_PROVIDER_SITE_OTHER): Payer: Medicare Other | Admitting: Internal Medicine

## 2013-07-18 ENCOUNTER — Encounter: Payer: Self-pay | Admitting: Internal Medicine

## 2013-07-18 VITALS — BP 132/74 | HR 80 | Ht 73.5 in | Wt 213.0 lb

## 2013-07-18 DIAGNOSIS — R1310 Dysphagia, unspecified: Secondary | ICD-10-CM

## 2013-07-18 DIAGNOSIS — K429 Umbilical hernia without obstruction or gangrene: Secondary | ICD-10-CM

## 2013-07-18 DIAGNOSIS — K2100 Gastro-esophageal reflux disease with esophagitis, without bleeding: Secondary | ICD-10-CM

## 2013-07-18 DIAGNOSIS — K222 Esophageal obstruction: Secondary | ICD-10-CM | POA: Diagnosis not present

## 2013-07-18 NOTE — Progress Notes (Signed)
HISTORY OF PRESENT ILLNESS:  Jonathan Garrison is a 68 y.o. male with the below listed medical history who was evaluated 04/18/2013 for reflux disease and intermittent solid food dysphagia. See that dictation. He subsequently underwent upper endoscopy 06/06/2013. He was found to have severe ulcerative esophagitis with scarring and stricturing in the distal 5 cm. As well, hiatal hernia. No dilation at that time due to the significant acute inflammation. He was prescribed omeprazole 40 mg daily. He has been compliant. He presents today for followup. He reports no indigestion or heartburn. His swallowing is not normal, but significantly improved. Problems with throat irritation and cough have resolved. No additional GI complaints. He has questions regarding a small umbilical hernia (recently saw Dr. Rosendo Gros with observation recommended-office note reviewed) and low back pain.  REVIEW OF SYSTEMS:  All non-GI ROS negative except for low back pain  Past Medical History  Diagnosis Date  . ADD (attention deficit disorder) without hyperactivity   . 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 Eczema   . Lichen planus   . Allergy   . Polio age 66  . Arthralgia     many joints, managed with vitamins/herbs and ibuprofen  . Colitis 1986    single attack (believes was ulcerative)  . Pleurisy 1984  . Chest pain   . Umbilical hernia   . Anxiety   . Depression   . GERD (gastroesophageal reflux disease)   . Neuromuscular disorder     POLIO AGE 46  . Esophageal stricture   . Hemorrhoids     Past Surgical History  Procedure Laterality Date  . Leg surgery  when in 6th grade    L leg, bone spur removal (just above knee, laterally)  . Tongue surgery      for tongue cancer  . Colonoscopy      Social History Jonathan Garrison  reports that he quit smoking about 37 years ago. His smoking use included Pipe and Cigarettes. He smoked 0.00 packs per day. He has never used smokeless tobacco. He reports that  he drinks alcohol. He reports that he does not use illicit drugs.  family history includes Benign prostatic hyperplasia in his father; Cancer in his sister; Diabetes in his maternal grandfather and mother; Heart disease in his father; Hyperlipidemia in his mother; Hypertension in his father and mother; Lichen planus in his sister.  Allergies  Allergen Reactions  . Penicillins     REACTION: ?  . Sulfa Antibiotics Other (See Comments)    unknown       PHYSICAL EXAMINATION: Vital signs: BP 132/74  Pulse 80  Ht 6' 1.5" (1.867 m)  Wt 213 lb (96.616 kg)  BMI 27.72 kg/m2 General: Well-developed, well-nourished, no acute distress HEENT: Sclerae are anicteric, conjunctiva pink. Oral mucosa intact Lungs: Clear Heart: Regular Abdomen: soft, nontender, nondistended, no obvious ascites, no peritoneal signs, normal bowel sounds. No organomegaly. Extremities: No edema Psychiatric: alert and oriented x3. Cooperative   ASSESSMENT:  #1. GERD with Severe ulcerative esophagitis with peptic stricture on endoscopy 06/06/2013. GERD symptoms improved on PPI #2. Peptic stricture with resultant dysphagia. Improve, but not normal, and PPI #3. Normal screening colonoscopy February 2012 #4. Umbilical hernia. Being followed with surgery   PLAN:  #1. Reflux precautions #2. Continue daily PPI #3. Schedule repeat EGD with dilation in about for 6 weeks. He can assess mucosal healing at that time.The nature of the procedure, as well as the risks, benefits, and alternatives were carefully and  thoroughly reviewed with the patient. Ample time for discussion and questions allowed. The patient understood, was satisfied, and agreed to proceed. #4. Due for repeat screening colonoscopy around February 2022

## 2013-07-18 NOTE — Patient Instructions (Addendum)
You have been scheduled for an endoscopy. Please follow written instructions given to you at your visit today. If you use inhalers (even only as needed), please bring them with you on the day of your procedure.   

## 2013-07-25 NOTE — Telephone Encounter (Signed)
Encounter complete. 

## 2013-08-15 ENCOUNTER — Ambulatory Visit (AMBULATORY_SURGERY_CENTER): Payer: Medicare Other | Admitting: Internal Medicine

## 2013-08-15 ENCOUNTER — Encounter: Payer: Self-pay | Admitting: Internal Medicine

## 2013-08-15 VITALS — BP 120/76 | HR 79 | Temp 98.3°F | Resp 16 | Ht 73.0 in | Wt 213.0 lb

## 2013-08-15 DIAGNOSIS — R1310 Dysphagia, unspecified: Secondary | ICD-10-CM | POA: Diagnosis not present

## 2013-08-15 DIAGNOSIS — K222 Esophageal obstruction: Secondary | ICD-10-CM | POA: Diagnosis not present

## 2013-08-15 DIAGNOSIS — K2100 Gastro-esophageal reflux disease with esophagitis, without bleeding: Secondary | ICD-10-CM

## 2013-08-15 DIAGNOSIS — K219 Gastro-esophageal reflux disease without esophagitis: Secondary | ICD-10-CM | POA: Diagnosis not present

## 2013-08-15 MED ORDER — SODIUM CHLORIDE 0.9 % IV SOLN: 500.0000 mL | INTRAVENOUS | Status: DC

## 2013-08-15 NOTE — Op Note (Signed)
[place]  [address]. [place], [address]   ENDOSCOPY PROCEDURE REPORT  PATIENT: Jonathan Garrison, Jonathan Garrison  MR#: [id number] BIRTHDATE: 1946-01-10 , 38  yrs. old GENDER: Male ENDOSCOPIST: Eustace Quail, MD REFERRED BY:  .  Self / Office PROCEDURE DATE:  08/15/2013 PROCEDURE:  EGD and balloon dilation of esophagus  -18, 19, 20 mm balloon ASA CLASS:     Class II INDICATIONS:  Dysphagia.   Therapeutic procedure. MEDICATIONS: MAC sedation, administered by CRNA and propofol (Diprivan) 200mg  IV TOPICAL ANESTHETIC: none  DESCRIPTION OF PROCEDURE: After the risks benefits and alternatives of the procedure were thoroughly explained, informed consent was obtained.  The LB [id number] endoscope was introduced through the mouth and advanced to the second portion of the duodenum. Without limitations.  The instrument was slowly withdrawn as the mucosa was fully examined.    EXAM:The esophagus demonstrated complete healing of previously demonstrated erosive esophagitis.  Residual scarring and stricturing present.  No other abnormalities of the esophagus.  The stomach was normal.  The duodenum was normal.  Retroflexed views revealed a hiatal hernia. THERAPY: A sequential TTS balloon was passed through the endoscope. The midportion was straddled across the stricture. The stricture was dilated with 18, 19, and 20 mm diameters. No heme. Tolerated well.The scope was then withdrawn from the patient and the procedure completed.  COMPLICATIONS: There were no complications. ENDOSCOPIC IMPRESSION: 1. GERD. Previously noted severe esophagitis healed on medication 2. Esophageal stricture status post dilation.  RECOMMENDATIONS: 1.  Clear liquids until 5 PM, then soft foods rest of day.  Resume prior diet tomorrow. 2.  Continue omeprazole daily 3.  Followup office visit with [name] in 1 year. Contact the office in the interim if needed.  REPEAT EXAM:  eSigned:  Eustace Quail, MD 08/15/2013 3:34 PM   FA:[id number] [name], MD and The Patient

## 2013-08-15 NOTE — Progress Notes (Signed)
A/ox3, pleased with MAC, report to RN 

## 2013-08-15 NOTE — Patient Instructions (Signed)
YOU HAD AN ENDOSCOPIC PROCEDURE TODAY AT [facility]: Refer to the procedure report that was given to you for any specific questions about what was found during the examination.  If the procedure report does not answer your questions, please call your gastroenterologist to clarify.  If you requested that your care partner not be given the details of your procedure findings, then the procedure report has been included in a sealed envelope for you to review at your convenience later.  YOU SHOULD EXPECT: Some feelings of bloating in the abdomen. Passage of more gas than usual.  Walking can help get rid of the air that was put into your GI tract during the procedure and reduce the bloating. If you had a lower endoscopy (such as a colonoscopy or flexible sigmoidoscopy) you may notice spotting of blood in your stool or on the toilet paper. If you underwent a bowel prep for your procedure, then you may not have a normal bowel movement for a few days.  DIET: Nothing by mouth until 5pm.  If that is tolerated, you may proceed to a soft diet for the ret of today.  You may have a regular diet tomorrow.  Drink plenty of fluids but you should avoid alcoholic beverages for 24 hours.  ACTIVITY: Your care partner should take you home directly after the procedure.  You should plan to take it easy, moving slowly for the rest of the day.  You can resume normal activity the day after the procedure however you should NOT DRIVE or use heavy machinery for 24 hours (because of the sedation medicines used during the test).    SYMPTOMS TO REPORT IMMEDIATELY: A gastroenterologist can be reached at any hour.  During normal business hours, 8:30 AM to 5:00 PM Monday through Friday, call [phone].  After hours and on weekends, please call the GI answering service at [phone] who will take a message and have the physician on call contact you.   Following upper endoscopy (EGD)  Vomiting of blood or coffee  ground material  New chest pain or pain under the shoulder blades  Painful or persistently difficult swallowing  New shortness of breath  Fever of 100F or higher  Black, tarry-looking stools  FOLLOW UP: If any biopsies were taken you will be contacted by phone or by letter within the next 1-3 weeks.  Call your gastroenterologist if you have not heard about the biopsies in 3 weeks.  Our staff will call the home number listed on your records the next business day following your procedure to check on you and address any questions or concerns that you may have at that time regarding the information given to you following your procedure. This is a courtesy call and so if there is no answer at the home number and we have not heard from you through the emergency physician on call, we will assume that you have returned to your regular daily activities without incident.  SIGNATURES/CONFIDENTIALITY: You and/or your care partner have signed paperwork which will be entered into your electronic medical record.  These signatures attest to the fact that that the information above on your After Visit Summary has been reviewed and is understood.  Full responsibility of the confidentiality of this discharge information lies with you and/or your care-partner.  Be sure to read all of the papers given to you by your recovery room nurse.   Try to cut down on alcohol consumption.

## 2013-08-15 NOTE — Progress Notes (Signed)
Called to room to assist during endoscopic procedure.  Patient ID and intended procedure confirmed with present staff. Received instructions for my participation in the procedure from the performing physician.  

## 2013-08-16 ENCOUNTER — Telehealth: Payer: Self-pay | Admitting: *Deleted

## 2013-08-16 NOTE — Telephone Encounter (Signed)
No answer, message left for the patient. 

## 2013-09-25 DIAGNOSIS — L678 Other hair color and hair shaft abnormalities: Secondary | ICD-10-CM | POA: Diagnosis not present

## 2013-09-25 DIAGNOSIS — L738 Other specified follicular disorders: Secondary | ICD-10-CM | POA: Diagnosis not present

## 2013-09-25 DIAGNOSIS — D239 Other benign neoplasm of skin, unspecified: Secondary | ICD-10-CM | POA: Diagnosis not present

## 2013-10-04 ENCOUNTER — Ambulatory Visit (INDEPENDENT_AMBULATORY_CARE_PROVIDER_SITE_OTHER): Payer: Medicare Other | Admitting: Emergency Medicine

## 2013-10-04 ENCOUNTER — Ambulatory Visit (INDEPENDENT_AMBULATORY_CARE_PROVIDER_SITE_OTHER): Payer: Medicare Other

## 2013-10-04 VITALS — BP 140/80 | HR 70 | Temp 99.2°F | Resp 18 | Ht 73.5 in | Wt 213.4 lb

## 2013-10-04 DIAGNOSIS — R0602 Shortness of breath: Secondary | ICD-10-CM | POA: Diagnosis not present

## 2013-10-04 DIAGNOSIS — R42 Dizziness and giddiness: Secondary | ICD-10-CM

## 2013-10-04 DIAGNOSIS — K219 Gastro-esophageal reflux disease without esophagitis: Secondary | ICD-10-CM | POA: Diagnosis not present

## 2013-10-04 DIAGNOSIS — Z79899 Other long term (current) drug therapy: Secondary | ICD-10-CM | POA: Diagnosis not present

## 2013-10-04 LAB — POCT CBC
GRANULOCYTE PERCENT: 63.9 % (ref 37–80)
HEMATOCRIT: 41.2 % — AB (ref 43.5–53.7)
Hemoglobin: 13.6 g/dL — AB (ref 14.1–18.1)
LYMPH, POC: 1.6 (ref 0.6–3.4)
MCH: 30.5 pg (ref 27–31.2)
MCHC: 33.1 g/dL (ref 31.8–35.4)
MCV: 92 fL (ref 80–97)
MID (cbc): 0.7 (ref 0–0.9)
MPV: 7.5 fL (ref 0–99.8)
POC Granulocyte: 4.2 (ref 2–6.9)
POC LYMPH PERCENT: 24.9 %L (ref 10–50)
POC MID %: 11.2 %M (ref 0–12)
Platelet Count, POC: 262 10*3/uL (ref 142–424)
RBC: 4.48 M/uL — AB (ref 4.69–6.13)
RDW, POC: 14.4 %
WBC: 6.5 10*3/uL (ref 4.6–10.2)

## 2013-10-04 NOTE — Progress Notes (Signed)
Subjective:  This chart was scribed for Jonathan Queen, MD by Terressa Koyanagi, ED Scribe. This patient was seen in room 13 and the patient's care was started at 4:10 PM.      Patient ID: Jonathan Garrison, male    DOB: 1945/02/24, 68 y.o.   MRN: [id number]  HPI  HPI Comments: Jonathan Garrison is a 68 y.o. male, with medical Hx noted below, who presents to the [facility] with several complaints.   Adderall: Pt wishes to start his adderall.   Dizziness: Pt complains of intermittent dizziness. Pt states "once in a blue moon I feel a bit dizzy."  Pt notes the last episode of dizziness took place last Saturday, after a 6 mile hike (that was the first hike pt had been on in 6-7 years), he began to feel dizzy and sensation of near syncope on his drive home. Pt notes, however, that in the midst of his drive he took a break to eat and the dizziness and sensation of near syncope resolved after eating.    Weight Loss: Pt complains of inability to lose his abdominal fat despite working out and eating healthy. Pt reports he does not skip meals, eating breakfast, lunch and dinner. Pt expresses a desire to see a nutritionist.   Brittle Fingernails: Pt complains of brittle fingernails onset 2 years ago. Pt reports that he is taking his vitamins and eats healthy.   SOB: Pt also complains of SOB. Pt reports he had a workup with cardiology, including a stress test, on 04/10/13. Pt's cardiologist is Dr. Quay Burow and the findings from the work up are as follows:   Impression Exercise Capacity: Lexiscan with no exercise. BP Response: Normal blood pressure response. Clinical Symptoms: No significant symptoms noted. ECG Impression: No significant ST segment change suggestive of  ischemia. 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  LV Wall Motion: Gating not  performed secondary to ectopy  Edema to Right Foot: Pt also complains of swelling to the right foot for which he takes Meloxicam. Pt notes, however, that the swelling has not subsided. Pt states that he has been on the Meloxicam for the past several months.   Other Providers, Hx Imaging: Dr. Sherwood Gambler at [facility] was pt's surgeon for his oral cancer. Pt reports that the last time he had a chest x-ray was decades ago.   Past Medical History  Diagnosis Date  . ADD (attention deficit disorder) without hyperactivity   . 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 Eczema   . Lichen planus   . Allergy   . Polio age 68  . Arthralgia     many joints, managed with vitamins/herbs and ibuprofen  . Colitis 1986    single attack (believes was ulcerative)  . Pleurisy 1984  . Chest pain   . Umbilical hernia   . Anxiety   . Depression   . GERD (gastroesophageal reflux disease)   . Neuromuscular disorder     POLIO AGE 48  . Esophageal stricture   . Hemorrhoids    Review of Systems  Constitutional: Negative for fever and chills.  Respiratory: Positive for shortness of breath.   Musculoskeletal:       Brittle nails; edema to right foot   Neurological: Positive for dizziness.  Psychiatric/Behavioral: Negative for confusion.  Objective:   Physical Exam  CONSTITUTIONAL: Well developed/well nourished HEAD: Normocephalic/atraumatic TONGUE: Scarring in the floor of the mouth to the left, but no tumors seen.  EYES: EOMI/PERRL ENMT: Mucous membranes moist; no endonpathy at the neck  NECK: supple no meningeal signs SPINE:entire spine nontender CV: S1/S2 noted, no murmurs/rubs/gallops noted LUNGS: Lungs are clear to auscultation bilaterally, no apparent distress ABDOMEN: soft, nontender, no rebound or guarding GU:no cva tenderness NEURO: Pt is awake/alert, moves all extremitiesx4 EXTREMITIES: pulses normal, full ROM SKIN: warm, color normal PSYCH: no abnormalities of mood  noted  Results for orders placed in visit on 10/04/13  POCT CBC      Result Value Ref Range   WBC 6.5  4.6 - 10.2 K/uL   Lymph, poc 1.6  0.6 - 3.4   POC LYMPH PERCENT 24.9  10 - 50 %L   MID (cbc) 0.7  0 - 0.9   POC MID % 11.2  0 - 12 %M   POC Granulocyte 4.2  2 - 6.9   Granulocyte percent 63.9  37 - 80 %G   RBC 4.48 (*) 4.69 - 6.13 M/uL   Hemoglobin 13.6 (*) 14.1 - 18.1 g/dL   HCT, POC 41.2 (*) 43.5 - 53.7 %   MCV 92.0  80 - 97 fL   MCH, POC 30.5  27 - 31.2 pg   MCHC 33.1  31.8 - 35.4 g/dL   RDW, POC 14.4     Platelet Count, POC 262  142 - 424 K/uL   MPV 7.5  0 - 99.8 fL     [facility] reading (PRIMARY) by  Dr. [name] chest x-ray is no acute disease.     Assessment & Plan:  Routine labs were done since patient is currently taking regular nonsteroidals I told him I did not feel it was safe for him to be on ADD drugs.. I encouraged him to take his continue supplements first brittle nails. We'll schedule a carotid Doppler study because of the patient's dizziness. Again I told him to take as  View of the nonsteroidals as he can get by with. He is going to come in when Dr. Laney Pastor comes in and discuss non-stimulant ADD medications.

## 2013-10-05 LAB — COMPREHENSIVE METABOLIC PANEL
ALT: 27 U/L (ref 0–53)
AST: 35 U/L (ref 0–37)
Albumin: 4.2 g/dL (ref 3.5–5.2)
Alkaline Phosphatase: 50 U/L (ref 39–117)
BUN: 20 mg/dL (ref 6–23)
CALCIUM: 9.4 mg/dL (ref 8.4–10.5)
CHLORIDE: 104 meq/L (ref 96–112)
CO2: 26 mEq/L (ref 19–32)
CREATININE: 0.89 mg/dL (ref 0.50–1.35)
GLUCOSE: 82 mg/dL (ref 70–99)
Potassium: 4.4 mEq/L (ref 3.5–5.3)
Sodium: 138 mEq/L (ref 135–145)
Total Bilirubin: 0.6 mg/dL (ref 0.2–1.2)
Total Protein: 6.6 g/dL (ref 6.0–8.3)

## 2013-10-10 ENCOUNTER — Telehealth: Payer: Self-pay

## 2013-10-10 NOTE — Telephone Encounter (Signed)
Patient states he will not be able to go to the doppler appt that he was called about today. Patient needs [place] to call and cancel the appt that we made. Patient states he wants [place] to reschedule this procedure for next week. CB # [phone]

## 2013-10-11 ENCOUNTER — Ambulatory Visit (HOSPITAL_COMMUNITY): Payer: Medicare Other | Attending: Emergency Medicine

## 2013-10-11 NOTE — Telephone Encounter (Signed)
lmom informing pt that he would need to call and reschedule his appointment

## 2013-10-16 ENCOUNTER — Ambulatory Visit (HOSPITAL_COMMUNITY)
Admission: RE | Admit: 2013-10-16 | Discharge: 2013-10-16 | Disposition: A | Discharge status: Home / Self Care | Payer: Medicare Other | Source: Ambulatory Visit | Attending: Emergency Medicine | Admitting: Emergency Medicine

## 2013-10-16 DIAGNOSIS — R42 Dizziness and giddiness: Secondary | ICD-10-CM | POA: Insufficient documentation

## 2013-10-16 NOTE — Progress Notes (Signed)
VASCULAR LAB PRELIMINARY  PRELIMINARY  PRELIMINARY  PRELIMINARY  Carotid duplex  completed.    Preliminary report:  Bilateral:  1-39% ICA stenosis.  Vertebral artery flow is antegrade.      Corinthia Helmers, RVT 10/16/2013, 12:45 PM

## 2014-01-06 ENCOUNTER — Telehealth: Payer: Self-pay

## 2014-01-06 NOTE — Telephone Encounter (Signed)
LMVM reminding patient to get a flu shot.

## 2014-04-15 ENCOUNTER — Encounter: Payer: Self-pay | Admitting: Emergency Medicine

## 2014-04-15 ENCOUNTER — Ambulatory Visit (INDEPENDENT_AMBULATORY_CARE_PROVIDER_SITE_OTHER): Payer: Medicare Other | Admitting: Emergency Medicine

## 2014-04-15 ENCOUNTER — Ambulatory Visit (INDEPENDENT_AMBULATORY_CARE_PROVIDER_SITE_OTHER): Payer: Medicare Other

## 2014-04-15 VITALS — BP 133/76 | HR 76 | Temp 98.1°F | Resp 16 | Ht 73.75 in | Wt 210.0 lb

## 2014-04-15 DIAGNOSIS — R772 Abnormality of alphafetoprotein: Secondary | ICD-10-CM

## 2014-04-15 DIAGNOSIS — Z1211 Encounter for screening for malignant neoplasm of colon: Secondary | ICD-10-CM | POA: Diagnosis not present

## 2014-04-15 DIAGNOSIS — M25552 Pain in left hip: Secondary | ICD-10-CM

## 2014-04-15 DIAGNOSIS — Z125 Encounter for screening for malignant neoplasm of prostate: Secondary | ICD-10-CM

## 2014-04-15 DIAGNOSIS — Z0000 Encounter for general adult medical examination without abnormal findings: Secondary | ICD-10-CM

## 2014-04-15 DIAGNOSIS — L609 Nail disorder, unspecified: Secondary | ICD-10-CM

## 2014-04-15 DIAGNOSIS — R232 Flushing: Secondary | ICD-10-CM

## 2014-04-15 DIAGNOSIS — Z13220 Encounter for screening for lipoid disorders: Secondary | ICD-10-CM | POA: Diagnosis not present

## 2014-04-15 DIAGNOSIS — E663 Overweight: Secondary | ICD-10-CM | POA: Diagnosis not present

## 2014-04-15 DIAGNOSIS — Z23 Encounter for immunization: Secondary | ICD-10-CM

## 2014-04-15 DIAGNOSIS — Z202 Contact with and (suspected) exposure to infections with a predominantly sexual mode of transmission: Secondary | ICD-10-CM

## 2014-04-15 DIAGNOSIS — Z209 Contact with and (suspected) exposure to unspecified communicable disease: Secondary | ICD-10-CM

## 2014-04-15 DIAGNOSIS — C069 Malignant neoplasm of mouth, unspecified: Secondary | ICD-10-CM | POA: Diagnosis not present

## 2014-04-15 LAB — CBC WITH DIFFERENTIAL/PLATELET
BASOS ABS: 0 10*3/uL (ref 0.0–0.1)
BASOS PCT: 1 % (ref 0–1)
EOS ABS: 0.3 10*3/uL (ref 0.0–0.7)
Eosinophils Relative: 6 % — ABNORMAL HIGH (ref 0–5)
HEMATOCRIT: 40.4 % (ref 39.0–52.0)
Hemoglobin: 13.6 g/dL (ref 13.0–17.0)
Lymphocytes Relative: 29 % (ref 12–46)
Lymphs Abs: 1.4 10*3/uL (ref 0.7–4.0)
MCH: 29.6 pg (ref 26.0–34.0)
MCHC: 33.7 g/dL (ref 30.0–36.0)
MCV: 88 fL (ref 78.0–100.0)
MONOS PCT: 11 % (ref 3–12)
MPV: 10 fL (ref 8.6–12.4)
Monocytes Absolute: 0.5 10*3/uL (ref 0.1–1.0)
NEUTROS ABS: 2.5 10*3/uL (ref 1.7–7.7)
NEUTROS PCT: 53 % (ref 43–77)
Platelets: 269 10*3/uL (ref 150–400)
RBC: 4.59 MIL/uL (ref 4.22–5.81)
RDW: 14.3 % (ref 11.5–15.5)
WBC: 4.7 10*3/uL (ref 4.0–10.5)

## 2014-04-15 LAB — COMPLETE METABOLIC PANEL WITH GFR
ALBUMIN: 4.2 g/dL (ref 3.5–5.2)
ALT: 20 U/L (ref 0–53)
AST: 27 U/L (ref 0–37)
Alkaline Phosphatase: 43 U/L (ref 39–117)
BUN: 19 mg/dL (ref 6–23)
CALCIUM: 9.1 mg/dL (ref 8.4–10.5)
CHLORIDE: 105 meq/L (ref 96–112)
CO2: 28 meq/L (ref 19–32)
Creat: 0.92 mg/dL (ref 0.50–1.35)
GFR, Est Non African American: 86 mL/min
Glucose, Bld: 90 mg/dL (ref 70–99)
POTASSIUM: 4.2 meq/L (ref 3.5–5.3)
Sodium: 139 mEq/L (ref 135–145)
TOTAL PROTEIN: 6.4 g/dL (ref 6.0–8.3)
Total Bilirubin: 1.2 mg/dL (ref 0.2–1.2)

## 2014-04-15 LAB — LIPID PANEL
Cholesterol: 180 mg/dL (ref 0–200)
HDL: 77 mg/dL (ref >=40)
LDL CALC: 95 mg/dL (ref 0–99)
TRIGLYCERIDES: 39 mg/dL (ref <150)
Total CHOL/HDL Ratio: 2.3 Ratio
VLDL: 8 mg/dL (ref 0–40)

## 2014-04-15 LAB — HIV ANTIBODY (ROUTINE TESTING W REFLEX): HIV 1&2 Ab, 4th Generation: NONREACTIVE

## 2014-04-15 LAB — POCT URINALYSIS DIPSTICK
BILIRUBIN UA: NEGATIVE
GLUCOSE UA: NEGATIVE
Ketones, UA: NEGATIVE
Leukocytes, UA: NEGATIVE
Nitrite, UA: NEGATIVE
Protein, UA: NEGATIVE
RBC UA: NEGATIVE
SPEC GRAV UA: 1.01
Urobilinogen, UA: 0.2
pH, UA: 7

## 2014-04-15 LAB — HEPATITIS C ANTIBODY: HCV Ab: NEGATIVE

## 2014-04-15 LAB — IFOBT (OCCULT BLOOD): IFOBT: NEGATIVE

## 2014-04-15 NOTE — Progress Notes (Addendum)
Subjective:  This chart was scribed for Jonathan Russian, MD by Jonathan Garrison, ED Scribe. The patient was seen in room 21. Patient's care was started at 3:09 PM.   Patient ID: Jonathan Garrison, male    DOB: [birth year], 69 y.o.   MRN: [id number]  Chief Complaint  Patient presents with  . Annual Exam  . Hot Flashes    burning cheeks and ears in late winter  . Abdominal Pain  . throat pain    feels like sand paper  . Hip Pain    left hip pain  . Dizziness  . Spots and/or Floaters  . bites his mouth  . Referral    nail splitting   HPI HPI Comments: Jonathan Garrison is a 69 y.o. male, with a h/o oral CA, lichen planus, polio, ADD, anxiety, depression, who presents to the [facility] or an annual exam.   Hot Flashes Pt reports hot flashes and flushed cheeks that he experienced in the late Winter. Pt reports that symptoms have resolved at this time. He states that he looked up his symptoms on WebMD and is concerned with carcinoid syndrome, lower testosterone levels, DM and HTN. Pt states that he has similar symptoms as carcinoid syndrome except for diarrhea. He reports occasional alcohol use; often he drinks non-alcoholic beer.   Dizziness Pt states that he has had his arteries checked since the last time he was seen; normal. He still reports intermittent dizziness, spotting/floaters and SOB. Pt sees his eye doctor annually. His next appointment is tomorrow.   Abdominal Pain Pt states "I feel fine 98% of the time but every once in a while I get a twinge" in his RUQ. Pt states that pain resoles after a few minutes. Pt is concerned that abdominal pain could be caused by Mobic.  Sore Throat Pt states that his throat feels like "sandy paper". He denies his typical GERD related burning sensation in his throat. He reports that his friend recently finished treatment for esophageal CA so he is concerned for this as well.   L Hip Pain Pt reports intermittent, gradually worsening L  hip pain that is exacerbated with bearing weight. Pt states that he has to favor his L hip when he experiences pain.   Nail Splitting Pt reports gradually worsening fingernail splitting. He states that he now has 4-5 split fingernails. He states that nail splitting has worsened since he was last seen by his dermatologist Jonathan Garrison.  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 He follows up at [facility] every 6 months; he was seen at [facility] last week. Pt states that a small ulceration was found that will be rechecked in 3 weeks. Pt reports h/o smoking in his 86s.  Mouth Biting Pt reports that lately he has bitten the L side of his gum and lip with eating. Pt states "I wonder if something is wrong with my brain that makes me bite down wrong".   Immunizations Pt has not received his pneumonia vaccine.   Past Medical History  Diagnosis Date  . ADD (attention deficit disorder) without hyperactivity   . 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 Eczema   . Lichen planus   . Allergy   . 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 Arthralgia     many joints, managed with vitamins/herbs and ibuprofen  . Colitis 1986    single attack (believes was ulcerative)  . Pleurisy 1984  .  Chest pain   . Umbilical hernia   . Anxiety   . Depression   . GERD (gastroesophageal reflux disease)   . Neuromuscular disorder     POLIO AGE 7  . Esophageal stricture   . Hemorrhoids    Current Outpatient Prescriptions on File Prior to Visit  Medication Sig Dispense Refill  . b complex vitamins tablet Take 0.5 tablets by mouth daily.      . Biotin 5000 MCG CAPS Take 1 capsule by mouth 2 (two) times daily.    . Calcium-Magnesium-Zinc 167-83-8 MG TABS Take by mouth. 1/2 tablet daily    . chlorpheniramine (CHLOR-TRIMETON) 4 MG tablet Take 2 mg by mouth 2 (two) times daily as needed.      . Cholecalciferol (VITAMIN D3) 2000 UNITS capsule Take 2,000 Units by mouth daily.    . fluticasone (FLONASE) 50 MCG/ACT nasal  spray Place 2 sprays into both nostrils daily. 16 g 6  . glucosamine-chondroitin 500-400 MG tablet Take 1 tablet by mouth 2 (two) times daily.      . meloxicam (MOBIC) 15 MG tablet Take 0.5 tablets by mouth 2 (two) times daily.     . Multiple Vitamins-Minerals (MULTIVITAMIN WITH MINERALS) tablet Take 1 tablet by mouth daily.      . NON FORMULARY ALJ -herbal decongestant  Takes as needed    . omeprazole (PRILOSEC) 40 MG capsule Take 1 capsule (40 mg total) by mouth daily. Take 1/2 hr. Before a meal. 30 capsule 11  . Zn-Pyg Afri-Nettle-Saw Palmet (SAW PALMETTO COMPLEX PO) Take 1 tablet by mouth 2 (two) times daily.     No current facility-administered medications on file prior to visit.   Allergies  Allergen Reactions  . Penicillins     REACTION: ?  . Sulfa Antibiotics Other (See Comments)    unknown   Review of Systems  HENT: Positive for sore throat.   Eyes: Positive for visual disturbance.  Gastrointestinal: Positive for abdominal pain.  Musculoskeletal: Positive for arthralgias.  Neurological: Positive for dizziness.      Objective:   Physical Exam CONSTITUTIONAL: Well developed/well nourished HEAD: Normocephalic/atraumatic EYES: EOMI/PERRL ENMT: Mucous membranes moist NECK: supple no meningeal signs SPINE/BACK:entire spine nontender CV: S1/S2 noted, no murmurs/rubs/gallops noted LUNGS: Lungs are clear to auscultation bilaterally, no apparent distress ABDOMEN: soft, nontender, no rebound or guarding, bowel sounds noted throughout abdomen, small easily reducible umbilical hernia GU: no cva tenderness, slight asymmetry of prostate, L side somewhat more firm than the R but no nodules palpable  NEURO: Pt is awake/alert/appropriate, moves all extremitiesx4. No facial droop.   EXTREMITIES: pulses normal/equal, full ROM SKIN: warm, color normal, ridging of nails on both hands PSYCH: no abnormalities of mood noted, alert and oriented to situation    Results for orders placed or  performed in visit on 10/04/13  Comprehensive metabolic panel  Result Value Ref Range   Sodium 138 135 - 145 mEq/L   Potassium 4.4 3.5 - 5.3 mEq/L   Chloride 104 96 - 112 mEq/L   CO2 26 19 - 32 mEq/L   Glucose, Bld 82 70 - 99 mg/dL   BUN 20 6 - 23 mg/dL   Creat 0.89 0.50 - 1.35 mg/dL   Total Bilirubin 0.6 0.2 - 1.2 mg/dL   Alkaline Phosphatase 50 39 - 117 U/L   AST 35 0 - 37 U/L   ALT 27 0 - 53 U/L   Total Protein 6.6 6.0 - 8.3 g/dL   Albumin 4.2 3.5 - 5.2 g/dL  Calcium 9.4 8.4 - 10.5 mg/dL  POCT CBC  Result Value Ref Range   WBC 6.5 4.6 - 10.2 K/uL   Lymph, poc 1.6 0.6 - 3.4   POC LYMPH PERCENT 24.9 10 - 50 %L   MID (cbc) 0.7 0 - 0.9   POC MID % 11.2 0 - 12 %M   POC Granulocyte 4.2 2 - 6.9   Granulocyte percent 63.9 37 - 80 %G   RBC 4.48 (A) 4.69 - 6.13 M/uL   Hemoglobin 13.6 (A) 14.1 - 18.1 g/dL   HCT, POC 41.2 (A) 43.5 - 53.7 %   MCV 92.0 80 - 97 fL   MCH, POC 30.5 27 - 31.2 pg   MCHC 33.1 31.8 - 35.4 g/dL   RDW, POC 14.4 %   Platelet Count, POC 262 142 - 424 K/uL   MPV 7.5 0 - 99.8 fL  [facility] reading (PRIMARY) by  Dr. [name] there is minimal flattening of the femoral heads please comment. On exam patient had full range of motion of both hips. Results for orders placed or performed in visit on 04/15/14  POCT urinalysis dipstick  Result Value Ref Range   Color, UA yellow    Clarity, UA clear    Glucose, UA neg    Bilirubin, UA neg    Ketones, UA neg    Spec Grav, UA 1.010    Blood, UA neg    pH, UA 7.0    Protein, UA neg    Urobilinogen, UA 0.2    Nitrite, UA neg    Leukocytes, UA Negative   IFOBT POC (occult bld, rslt in office)  Result Value Ref Range   IFOBT Negative    Assessment & Plan:  Patient's physical exam was unremarkable. There was slight asymmetry of the prostate but no definite masses. He was given the name of Dr. [name] for evaluation of his abnormal nails. I did include STD testing along with testosterone and a specific blood marker for carcinoid  because he has concerns of this with episodes of flushing. He has been on the Internet checking this out. Prevnar was given today. He has had some rare episodes of upper abdominal discomfort but has recently had an endoscopy and is up-to-date on colonoscopy. Hip films done were unremarkable except for some questionable flattening of the femoral heads. I did advise him to cut back on his Mobic he has been taking daily and try and take it every 2-3 days. I personally performed the services described in this documentation, which was scribed in my presence. The recorded information has been reviewed and is accurate.

## 2014-04-16 DIAGNOSIS — H43813 Vitreous degeneration, bilateral: Secondary | ICD-10-CM | POA: Diagnosis not present

## 2014-04-16 DIAGNOSIS — H2513 Age-related nuclear cataract, bilateral: Secondary | ICD-10-CM | POA: Diagnosis not present

## 2014-04-16 DIAGNOSIS — H40013 Open angle with borderline findings, low risk, bilateral: Secondary | ICD-10-CM | POA: Diagnosis not present

## 2014-04-16 LAB — TESTOSTERONE, FREE, TOTAL, SHBG
SEX HORMONE BINDING: 62 nmol/L (ref 22–77)
TESTOSTERONE FREE: 70.5 pg/mL (ref 47.0–244.0)
TESTOSTERONE-% FREE: 1.4 % — AB (ref 1.6–2.9)
TESTOSTERONE: 517 ng/dL (ref 300–890)

## 2014-04-16 LAB — RPR

## 2014-04-16 LAB — PSA, MEDICARE: PSA: 1.11 ng/mL (ref <=4.00)

## 2014-04-17 ENCOUNTER — Telehealth: Payer: Self-pay

## 2014-04-17 NOTE — Telephone Encounter (Signed)
Patient says there is no need to call him back. Just send a letter with his results

## 2014-04-17 NOTE — Telephone Encounter (Signed)
Done

## 2014-04-18 LAB — CHROMOGRANIN A: Chromogranin A: 26 ng/mL — ABNORMAL HIGH (ref <=15)

## 2014-04-19 ENCOUNTER — Other Ambulatory Visit: Payer: Self-pay | Admitting: Emergency Medicine

## 2014-04-19 DIAGNOSIS — L609 Nail disorder, unspecified: Secondary | ICD-10-CM

## 2014-04-19 NOTE — Addendum Note (Signed)
Addended by: Constance Goltz on: 04/19/2014 01:28 PM   Modules accepted: Orders, SmartSet

## 2014-04-20 NOTE — Addendum Note (Signed)
Addended by: Constance Goltz on: 04/20/2014 11:10 AM   Modules accepted: Miquel Dunn

## 2014-04-20 NOTE — Addendum Note (Signed)
Addended by: Constance Goltz on: 04/20/2014 11:20 AM   Modules accepted: Orders, SmartSet

## 2014-04-25 ENCOUNTER — Telehealth: Payer: Self-pay | Admitting: Internal Medicine

## 2014-04-25 NOTE — Telephone Encounter (Signed)
Pt states he has been taking omeprazole and it has been working great until recently. Pt states he has burning in his throat and it feels like there is sand paper in his throat. Pt wasn't sure if he needed to be seen or if his medication needs to be switched. Please advise.

## 2014-04-27 NOTE — Telephone Encounter (Signed)
Since he is having breakthrough, increase omeprazole to 40 mg BID.

## 2014-04-28 NOTE — Telephone Encounter (Signed)
Left message for pt to call back  °

## 2014-04-30 NOTE — Telephone Encounter (Signed)
Spoke with pt and he is aware. Pt states he has realized that it may have been allergies that he was having a problem with, he took some antihistamines and got better. Pt states he will monitor and increase omeprazole if he thinks he needs to.

## 2014-05-08 DIAGNOSIS — K1379 Other lesions of oral mucosa: Secondary | ICD-10-CM | POA: Diagnosis not present

## 2014-05-09 DIAGNOSIS — K1379 Other lesions of oral mucosa: Secondary | ICD-10-CM | POA: Diagnosis not present

## 2014-05-12 ENCOUNTER — Telehealth: Payer: Self-pay | Admitting: *Deleted

## 2014-05-12 NOTE — Telephone Encounter (Signed)
Dr. [name],  Patient would like to come in on Thursday to have the Carcinoid Marker Test, plus he would like to have his iron and vitamins levels check due to his nail deterioration.  Can you please create orders in the system, so he can come to the [facility] [address] on Thursday.  He requested a recommendation for a dermatologist as well.  Thank you,

## 2014-05-12 NOTE — Telephone Encounter (Signed)
fyi dr [name].

## 2014-05-13 ENCOUNTER — Other Ambulatory Visit: Payer: Self-pay | Admitting: Family Medicine

## 2014-05-13 DIAGNOSIS — C069 Malignant neoplasm of mouth, unspecified: Secondary | ICD-10-CM

## 2014-05-13 DIAGNOSIS — L608 Other nail disorders: Secondary | ICD-10-CM

## 2014-05-13 DIAGNOSIS — R232 Flushing: Secondary | ICD-10-CM

## 2014-05-13 NOTE — Telephone Encounter (Signed)
I will put the orders in

## 2014-05-13 NOTE — Telephone Encounter (Signed)
lmom to notify pt.

## 2014-05-15 ENCOUNTER — Other Ambulatory Visit (INDEPENDENT_AMBULATORY_CARE_PROVIDER_SITE_OTHER): Payer: Medicare Other | Admitting: *Deleted

## 2014-05-15 DIAGNOSIS — C069 Malignant neoplasm of mouth, unspecified: Secondary | ICD-10-CM

## 2014-05-15 DIAGNOSIS — R232 Flushing: Secondary | ICD-10-CM | POA: Diagnosis not present

## 2014-05-15 DIAGNOSIS — R790 Abnormal level of blood mineral: Secondary | ICD-10-CM

## 2014-05-15 DIAGNOSIS — L609 Nail disorder, unspecified: Secondary | ICD-10-CM | POA: Diagnosis not present

## 2014-05-15 DIAGNOSIS — L608 Other nail disorders: Secondary | ICD-10-CM

## 2014-05-16 LAB — FERRITIN: Ferritin: 20 ng/mL — ABNORMAL LOW (ref 22–322)

## 2014-05-20 ENCOUNTER — Encounter: Payer: Self-pay | Admitting: Internal Medicine

## 2014-05-22 LAB — CHROMOGRANIN A: CHROMOGRANIN A: 8 ng/mL (ref <=15)

## 2014-05-23 ENCOUNTER — Telehealth: Payer: Self-pay

## 2014-05-23 NOTE — Telephone Encounter (Signed)
Pt calling about labs. Please review. Thanks  

## 2014-05-24 ENCOUNTER — Other Ambulatory Visit: Payer: Self-pay | Admitting: Emergency Medicine

## 2014-05-27 NOTE — Telephone Encounter (Signed)
Patient is returning a missed phone call for lab results. Patient states that lab hasn't been picking up and it'll be great if he can get a phone call back by 4. Patient was told that this isn't a guarantee but someone will definitely go over the lab results with him.

## 2014-05-29 NOTE — Addendum Note (Signed)
Addended by: Constance Goltz on: 05/29/2014 02:58 PM   Modules accepted: Orders

## 2014-05-29 NOTE — Telephone Encounter (Signed)
See labs 

## 2014-06-05 ENCOUNTER — Telehealth: Payer: Self-pay | Admitting: Hematology & Oncology

## 2014-06-05 NOTE — Telephone Encounter (Signed)
Left vm w NEW PATIENT today to remind them of their appointment with Dr. Ennever. Also, advised them to bring all medication bottles and insurance card information. ° °

## 2014-06-06 ENCOUNTER — Other Ambulatory Visit (HOSPITAL_BASED_OUTPATIENT_CLINIC_OR_DEPARTMENT_OTHER): Payer: Medicare Other

## 2014-06-06 ENCOUNTER — Other Ambulatory Visit: Payer: Self-pay | Admitting: Family

## 2014-06-06 ENCOUNTER — Ambulatory Visit: Payer: Medicare Other

## 2014-06-06 ENCOUNTER — Encounter: Payer: Self-pay | Admitting: Internal Medicine

## 2014-06-06 ENCOUNTER — Ambulatory Visit (HOSPITAL_BASED_OUTPATIENT_CLINIC_OR_DEPARTMENT_OTHER): Payer: Medicare Other | Admitting: Family

## 2014-06-06 ENCOUNTER — Encounter: Payer: Self-pay | Admitting: Family

## 2014-06-06 VITALS — BP 146/70 | HR 72 | Temp 98.0°F | Resp 18 | Ht 73.0 in | Wt 209.0 lb

## 2014-06-06 DIAGNOSIS — D519 Vitamin B12 deficiency anemia, unspecified: Secondary | ICD-10-CM | POA: Diagnosis not present

## 2014-06-06 DIAGNOSIS — R790 Abnormal level of blood mineral: Secondary | ICD-10-CM

## 2014-06-06 DIAGNOSIS — R5382 Chronic fatigue, unspecified: Secondary | ICD-10-CM

## 2014-06-06 DIAGNOSIS — E611 Iron deficiency: Secondary | ICD-10-CM

## 2014-06-06 DIAGNOSIS — R7889 Finding of other specified substances, not normally found in blood: Secondary | ICD-10-CM | POA: Diagnosis not present

## 2014-06-06 LAB — CBC WITH DIFFERENTIAL (CANCER CENTER ONLY)
BASO#: 0 10*3/uL (ref 0.0–0.2)
BASO%: 0.6 % (ref 0.0–2.0)
EOS%: 5.4 % (ref 0.0–7.0)
Eosinophils Absolute: 0.3 10*3/uL (ref 0.0–0.5)
HEMATOCRIT: 40 % (ref 38.7–49.9)
HGB: 13.7 g/dL (ref 13.0–17.1)
LYMPH#: 1.3 10*3/uL (ref 0.9–3.3)
LYMPH%: 24.5 % (ref 14.0–48.0)
MCH: 30.7 pg (ref 28.0–33.4)
MCHC: 34.3 g/dL (ref 32.0–35.9)
MCV: 90 fL (ref 82–98)
MONO#: 0.5 10*3/uL (ref 0.1–0.9)
MONO%: 9.4 % (ref 0.0–13.0)
NEUT%: 60.1 % (ref 40.0–80.0)
NEUTROS ABS: 3.2 10*3/uL (ref 1.5–6.5)
Platelets: 250 10*3/uL (ref 145–400)
RBC: 4.46 10*6/uL (ref 4.20–5.70)
RDW: 13.5 % (ref 11.1–15.7)
WBC: 5.2 10*3/uL (ref 4.0–10.0)

## 2014-06-06 LAB — CHCC SATELLITE - SMEAR

## 2014-06-06 NOTE — Progress Notes (Signed)
Hematology/Oncology Consultation   Name: Jonathan Garrison      MRN: [id number]    Location: Room/bed info not found  Date: 06/06/2014 Time:4:03 PM   REFERRING PHYSICIAN: Remo Lipps A. Daub  REASON FOR CONSULT: Low ferritin level   DIAGNOSIS: Low ferritin level  HISTORY OF PRESENT ILLNESS: Jonathan Garrison is a very pleasant 69 yo white male a with a recent low ferritin level of 20. He is not anemic. His Hgb is 13.7 and MCV 90. His differential is normal. Dr. Marin Olp did view his blood smear and did not find any abnormality or evidence of malignancy.  He is c/o fatigue and nail bed changes. His nails are thin and his thumb nails are splitting.  He is very active and does a lot of walking and runs.  He has had a rash on the top of his head that comes and goes.  He denies fever, chills, n/v, cough, headache, blurred vision, SOB, chest pain, palpitations, abdominal pain, constipation, diarrhea, blood in urine or stool. No episodes of bruising or bleeding.  No swelling, tenderness, numbness or tingling in his extremities. No new aches or pains. He had Polio as a child and has some residual joint pain and stiffness at times.  He has a good appetite and is staying hydrated. His weight is stable. He has had oral cancer on the under side of his tongue. He is followed by [facility] and is seen by them every 6 months. He has had 8 excisions in 6-8 years.  He has not smoked since his late 20's. He does not drink alcohol.  His last colonoscopy was in 2012 and was normal.  He has a hiatal hernia and takes omeprazole. His FOBT in March was negative.  No family history of cancer. He has 2 daughters both of whom are healthy.  He has requested that we check his B 12 level. Her is aware that Medicare does not cover this and he is willing to pay out of pocket.    ROS: All other 10 point review of systems is negative.   PAST MEDICAL HISTORY:   Past Medical History  Diagnosis Date  . ADD (attention deficit disorder)  without hyperactivity   . 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 Eczema   . Lichen planus   . Allergy   . Polio age 83  . Arthralgia     many joints, managed with vitamins/herbs and ibuprofen  . Colitis 1986    single attack (believes was ulcerative)  . Pleurisy 1984  . Chest pain   . Umbilical hernia   . Anxiety   . Depression   . GERD (gastroesophageal reflux disease)   . Neuromuscular disorder     POLIO AGE 18  . Esophageal stricture   . Hemorrhoids     ALLERGIES: Allergies  Allergen Reactions  . Penicillins     REACTION: ?  . Sulfa Antibiotics Other (See Comments)    unknown      MEDICATIONS:  Current Outpatient Prescriptions on File Prior to Visit  Medication Sig Dispense Refill  . b complex vitamins tablet Take 0.5 tablets by mouth daily.      . Biotin 5000 MCG CAPS Take 1 capsule by mouth 2 (two) times daily.    . Calcium-Magnesium-Zinc 167-83-8 MG TABS Take by mouth. 1/2 tablet daily    . chlorpheniramine (CHLOR-TRIMETON) 4 MG tablet Take 2 mg by mouth every 6 (six) hours as needed.     Marland Kitchen  Cholecalciferol (VITAMIN D3) 2000 UNITS capsule Take 2,000 Units by mouth daily.    . fluticasone (FLONASE) 50 MCG/ACT nasal spray PLACE 2 SPRAYS INTO BOTH NOSTRILS DAILY.  "OV NEEDED FOR ADDITIONAL REFILLS" 16 g 0  . glucosamine-chondroitin 500-400 MG tablet Take 1 tablet by mouth 2 (two) times daily.      . meloxicam (MOBIC) 15 MG tablet 15 mg as needed.     . Multiple Vitamins-Minerals (MULTIVITAMIN WITH MINERALS) tablet Take 1 tablet by mouth daily.      . NON FORMULARY ALJ -herbal decongestant  Takes as needed    . omeprazole (PRILOSEC) 40 MG capsule Take 1 capsule (40 mg total) by mouth daily. Take 1/2 hr. Before a meal. 30 capsule 11  . Zn-Pyg Afri-Nettle-Saw Palmet (SAW PALMETTO COMPLEX PO) Take 1 tablet by mouth 2 (two) times daily.     No current facility-administered medications on file prior to visit.     PAST SURGICAL HISTORY Past Surgical History   Procedure Laterality Date  . Leg surgery  when in 6th grade    L leg, bone spur removal (just above knee, laterally)  . Tongue surgery      for tongue cancer  . Colonoscopy      FAMILY HISTORY: Family History  Problem Relation Age of Onset  . Hypertension Mother   . Diabetes Mother   . Hyperlipidemia Mother   . Hypertension Father   . Benign prostatic hyperplasia Father   . Heart disease Father   . Lichen planus Sister   . Cancer Sister     ?spot on chest?  . Diabetes Maternal Grandfather     SOCIAL HISTORY:  reports that he quit smoking about 38 years ago. His smoking use included Pipe and Cigarettes. He has never used smokeless tobacco. He reports that he drinks alcohol. He reports that he does not use illicit drugs.  PERFORMANCE STATUS: The patient's performance status is 1 - Symptomatic but completely ambulatory  PHYSICAL EXAM: Most Recent Vital Signs: Blood pressure 146/70, pulse 72, temperature 98 F (36.7 C), temperature source Oral, resp. rate 18, height _0  (1.854 m), weight 209 lb (94.802 kg). BP 146/70 mmHg  Pulse 72  Temp(Src) 98 F (36.7 C) (Oral)  Resp 18  Ht _1  (1.854 m)  Wt 209 lb (94.802 kg)  BMI 27.58 kg/m2  General Appearance:    Alert, cooperative, no distress, appears stated age  Head:    Normocephalic, without obvious abnormality, atraumatic  Eyes:    PERRL, conjunctiva/corneas clear, EOM's intact, fundi    benign, both eyes        Throat:   Lips, mucosa, and tongue normal; teeth and gums normal  Neck:   Supple, symmetrical, trachea midline, no adenopathy;    thyroid:  no enlargement/tenderness/nodules; no carotid   bruit or JVD  Back:     Symmetric, no curvature, ROM normal, no CVA tenderness  Lungs:     Clear to auscultation bilaterally, respirations unlabored  Chest Wall:    No tenderness or deformity   Heart:    Regular rate and rhythm, S1 and S2 normal, no murmur, rub   or gallop  Breast Exam:    No tenderness, masses, or nipple  abnormality  Abdomen:     Soft, non-tender, bowel sounds active all four quadrants,    no masses, no organomegaly        Extremities:   Extremities normal, atraumatic, no cyanosis or edema  Pulses:   2+ and symmetric  all extremities  Skin:   Skin color, texture, turgor normal, no rashes or lesions  Lymph nodes:   Cervical, supraclavicular, and axillary nodes normal  Neurologic:   CNII-XII intact, normal strength, sensation and reflexes    throughout   LABORATORY DATA:  Results for orders placed or performed in visit on 06/06/14 (from the past 48 hour(s))  CBC with Differential [facility])     Status: None   Collection Time: 06/06/14  2:06 PM  Result Value Ref Range   WBC 5.2 4.0 - 10.0 10e3/uL   RBC 4.46 4.20 - 5.70 10e6/uL   HGB 13.7 13.0 - 17.1 g/dL   HCT 40.0 38.7 - 49.9 %   MCV 90 82 - 98 fL   MCH 30.7 28.0 - 33.4 pg   MCHC 34.3 32.0 - 35.9 g/dL   RDW 13.5 11.1 - 15.7 %   Platelets 250 145 - 400 10e3/uL   NEUT# 3.2 1.5 - 6.5 10e3/uL   LYMPH# 1.3 0.9 - 3.3 10e3/uL   MONO# 0.5 0.1 - 0.9 10e3/uL   Eosinophils Absolute 0.3 0.0 - 0.5 10e3/uL   BASO# 0.0 0.0 - 0.2 10e3/uL   NEUT% 60.1 40.0 - 80.0 %   LYMPH% 24.5 14.0 - 48.0 %   MONO% 9.4 0.0 - 13.0 %   EOS% 5.4 0.0 - 7.0 %   BASO% 0.6 0.0 - 2.0 %  [facility] - Smear     Status: None   Collection Time: 06/06/14  2:06 PM  Result Value Ref Range   Smear Result Smear Available       RADIOGRAPHY: No results found.     PATHOLOGY: None  ASSESSMENT/PLAN: Mr. Rademaker is a very pleasant 69 yo white male a with a recent low ferritin level of 20. He is not anemic. He c/o fatigue and nail bed changes.  His Hgb is 13.7 and MCV 90. His differential is normal.  Dr. Marin Olp did view his blood smear and did not find any abnormality or evidence of malignancy.  We will wait and see what his iron studies and other labs work shows before scheduling a follow-up.  All questions were answered. He knows to call the clinic with any  problems, questions or concerns. We can certainly see him much sooner if necessary.  The patient was discussed with and also seen by Dr. Marin Olp and he is in agreement with the aforementioned.   [facility]    Addendum:  I saw and examined patient with Lindell Tussey. I looked at his blood on the microscope. I really do not see any microcytic or hypochromic red cells. He has no rouleau formation. There are no target cells or inclusion bodies. White blood cells appeared normal in morphology maturation. Platelets looked adequate.  I just have a hard time believing that he has clinical iron deficiency given that he is not anemic. I am checking a JAK2 assay on him. Iron deficiency and no anemia could be an indicator for polycythemia. I suppose polycythemia could manifest with nail changes.  I'm sure that there probably is some literature regarding nail changes and iron deficiency. Typically I does not noticed any iron deficiency as a cause of nail changes. Our patients have really never complained of nail bed changes.  We will see what his lab results show.  I don't see that he needs a bone marrow test. However, if his JAK2 assay is positive, then we may have to consider a bone marrow biopsy.  He is very nice.  He is very knowledgeable. I certainly had a nice time talking with him.  We will see if he has to come back to see [place]. He sees a lot of doctors already.  We spent about 45 minutes with him.  Laurey Arrow

## 2014-06-09 ENCOUNTER — Other Ambulatory Visit: Payer: Self-pay | Admitting: Family

## 2014-06-09 DIAGNOSIS — D519 Vitamin B12 deficiency anemia, unspecified: Secondary | ICD-10-CM | POA: Insufficient documentation

## 2014-06-09 LAB — IRON AND TIBC CHCC
%SAT: 30 % (ref 20–55)
IRON: 124 ug/dL (ref 42–163)
TIBC: 414 ug/dL — ABNORMAL HIGH (ref 202–409)
UIBC: 289 ug/dL (ref 117–376)

## 2014-06-09 LAB — FERRITIN CHCC: FERRITIN: 30 ng/mL (ref 22–316)

## 2014-06-09 LAB — RETICULOCYTES (CHCC)
ABS RETIC: 31.6 10*3/uL (ref 19.0–186.0)
RBC.: 4.51 MIL/uL (ref 4.22–5.81)
RETIC CT PCT: 0.7 % (ref 0.4–2.3)

## 2014-06-09 LAB — ERYTHROPOIETIN: ERYTHROPOIETIN: 16.2 m[IU]/mL (ref 2.6–18.5)

## 2014-06-09 LAB — VITAMIN B12: VITAMIN B 12: 448 pg/mL (ref 211–911)

## 2014-06-16 ENCOUNTER — Telehealth: Payer: Self-pay | Admitting: *Deleted

## 2014-06-16 NOTE — Telephone Encounter (Addendum)
Patient aware of results. Results will be mailed to his home per his request.   ----- Message from Eliezer Bottom, NP sent at 06/16/2014 10:23 AM EDT ----- Regarding: Labs ok Please let patient know that his iron studies and JAK 2 were ok. We do not need to see him back in the office at this time but he is welcome to follow-up with [place] for any hematologic issues in the future. Thanks!!!  ----- Message -----    From: Lab [facility] Interface    Sent: 06/06/2014   2:25 PM      To: Eliezer Bottom, NP

## 2014-06-17 ENCOUNTER — Other Ambulatory Visit: Payer: Self-pay | Admitting: Internal Medicine

## 2014-08-04 ENCOUNTER — Ambulatory Visit: Payer: Medicare Other | Admitting: Internal Medicine

## 2014-08-04 ENCOUNTER — Encounter: Payer: Self-pay | Admitting: Internal Medicine

## 2014-08-04 ENCOUNTER — Encounter (INDEPENDENT_AMBULATORY_CARE_PROVIDER_SITE_OTHER): Payer: Medicare Other

## 2014-08-04 ENCOUNTER — Other Ambulatory Visit: Payer: Self-pay | Admitting: Physician Assistant

## 2014-08-04 ENCOUNTER — Ambulatory Visit (INDEPENDENT_AMBULATORY_CARE_PROVIDER_SITE_OTHER): Payer: Medicare Other | Admitting: Internal Medicine

## 2014-08-04 VITALS — BP 134/78 | HR 79 | Ht 73.0 in | Wt 211.0 lb

## 2014-08-04 DIAGNOSIS — K429 Umbilical hernia without obstruction or gangrene: Secondary | ICD-10-CM | POA: Diagnosis not present

## 2014-08-04 DIAGNOSIS — K222 Esophageal obstruction: Secondary | ICD-10-CM | POA: Diagnosis not present

## 2014-08-04 DIAGNOSIS — E611 Iron deficiency: Secondary | ICD-10-CM

## 2014-08-04 DIAGNOSIS — D509 Iron deficiency anemia, unspecified: Secondary | ICD-10-CM | POA: Diagnosis not present

## 2014-08-04 DIAGNOSIS — K219 Gastro-esophageal reflux disease without esophagitis: Secondary | ICD-10-CM

## 2014-08-04 DIAGNOSIS — K2100 Gastro-esophageal reflux disease with esophagitis, without bleeding: Secondary | ICD-10-CM

## 2014-08-04 LAB — IGA: IgA: 276 mg/dL (ref 68–378)

## 2014-08-04 MED ORDER — OMEPRAZOLE 40 MG PO CPDR: DELAYED_RELEASE_CAPSULE | ORAL | Status: DC

## 2014-08-04 NOTE — Progress Notes (Signed)
HISTORY OF PRESENT ILLNESS:  Jonathan Garrison is a 69 y.o. 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 His index endoscopy was performed May 2015. Severe erosive change and stricturing. This placed on omeprazole 40 mg daily. Follow-up endoscopy July 2015 revealed mucosal healing with significant residual stricture which was balloon dilated. He was continued on omeprazole and asked to follow-up at this time. He continues on omeprazole daily. No reflux symptoms. He does experience some mild intermittent dysphagia, though significantly improved since his last dilation. He was found to have iron deficiency without anemia. He did see a hematologist. He has had negative colonoscopy in 2012. GI review of systems is otherwise negative. He inquires about testing for celiac disease.  REVIEW OF SYSTEMS:  All non-GI ROS negative except for impaired hearing, arthritis, anxiety, depression,  Past Medical History  Diagnosis Date  . ADD (attention deficit disorder) without hyperactivity   . 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 Eczema   . Lichen planus   . Allergy   . Polio age 89  . Arthralgia     many joints, managed with vitamins/herbs and ibuprofen  . Colitis 1986    single attack (believes was ulcerative)  . Pleurisy 1984  . Chest pain   . Umbilical hernia   . Anxiety   . Depression   . GERD (gastroesophageal reflux disease)   . Neuromuscular disorder     POLIO AGE 51  . Esophageal stricture   . Hemorrhoids     Past Surgical History  Procedure Laterality Date  . Leg surgery  when in 6th grade    L leg, bone spur removal (just above knee, laterally)  . Tongue surgery      for tongue cancer  . Colonoscopy      Social History Malcom Selmer  reports that he quit smoking about 38 years ago. His smoking use included Pipe and Cigarettes. He has never used smokeless tobacco. He reports that  he drinks alcohol. He reports that he does not use illicit drugs.  family history includes Benign prostatic hyperplasia in his father; Cancer in his sister; Diabetes in his maternal grandfather and mother; Heart disease in his father; Hyperlipidemia in his mother; Hypertension in his father and mother; Lichen planus in his sister.  Allergies  Allergen Reactions  . Penicillins     REACTION: ?  . Sulfa Antibiotics Other (See Comments)    unknown       PHYSICAL EXAMINATION: Vital signs: BP 134/78 mmHg  Pulse 79  Ht 6\' 1"  (1.854 m) General: Well-developed, well-nourished, no acute distress HEENT: Sclerae are anicteric, conjunctiva pink. Oral mucosa intact Lungs: Clear Heart: Regular Abdomen: soft, nontender, nondistended, no obvious ascites, no peritoneal signs, normal bowel sounds. No organomegaly. Small umbilical hernia Extremities: No clubbing cyanosis or edema Psychiatric: alert and oriented x3. Cooperative     ASSESSMENT:  #1. GERD with severe erosive esophagitis and stricturing on index EGD May 2015. Subsequent EGD July 2015 with mucosal healing and residual high-grade stricture which was dilated. Doing well on omeprazole 40 mg daily. #2. Iron deficiency without anemia #3. Index colonoscopy 2012 negative for neoplasia   PLAN:  #1. Reflux precautions #2. Continue daily PPI. Omeprazole refilled for one year #3. Repeat EGD with dilation as indicated for recurrent dysphagia. Advised #4. Tissue transglutaminase antibody IgA and serum IgA level to screen for celiac disease #5. Take daily multivitamin with  iron #6. Routine GI follow-up one year #7. Surveillance colonoscopy 2022  25 minutes spent with this patient face-to-face. Greater than 50% of the time use for counseling

## 2014-08-04 NOTE — Patient Instructions (Signed)
Your physician has requested that you go to the basement for the following lab work before leaving today:  TTG, IGA  We have sent the following medications to your pharmacy for you to pick up at your convenience:  Omeprazole

## 2014-08-05 ENCOUNTER — Other Ambulatory Visit: Payer: Self-pay | Admitting: Physician Assistant

## 2014-08-06 LAB — TISSUE TRANSGLUTAMINASE, IGA: Tissue Transglutaminase Ab, IgA: 1 U/mL (ref <4)

## 2014-09-19 ENCOUNTER — Telehealth: Payer: Self-pay

## 2014-09-19 NOTE — Telephone Encounter (Signed)
Patient is requesting a prescription for Viagara sent to [facility] on [address]. He states that Dr. [name] said it's okay during his last visit. Also, patient states that Dr. [name] mentioned male dermatologist doctor and he forgot the name. He states he would like to get a referral. Please call patient!

## 2014-09-21 NOTE — Telephone Encounter (Signed)
Call inViagra 100 mg 1/2-1 tablet 1 hour prior to intercourse #5 tablets with 5 refills. The dermatologist I would like him to see his Dr. Wilhemina Bonito.

## 2014-09-22 MED ORDER — SILDENAFIL CITRATE 100 MG PO TABS: 50.0000 mg | ORAL_TABLET | Freq: Every day | ORAL | Status: DC | PRN

## 2014-09-22 NOTE — Telephone Encounter (Signed)
Rx sent. Called pt to let him know. Left detailed message on VM.

## 2014-10-28 ENCOUNTER — Encounter: Payer: Self-pay | Admitting: Emergency Medicine

## 2014-10-29 ENCOUNTER — Telehealth: Payer: Self-pay | Admitting: Internal Medicine

## 2014-10-31 NOTE — Telephone Encounter (Signed)
Answered Bill's questions

## 2014-11-03 DIAGNOSIS — Z79899 Other long term (current) drug therapy: Secondary | ICD-10-CM | POA: Diagnosis not present

## 2014-11-03 DIAGNOSIS — C005 Malignant neoplasm of lip, unspecified, inner aspect: Secondary | ICD-10-CM | POA: Diagnosis not present

## 2014-11-05 ENCOUNTER — Encounter: Payer: Self-pay | Admitting: Emergency Medicine

## 2014-11-11 DIAGNOSIS — K1329 Other disturbances of oral epithelium, including tongue: Secondary | ICD-10-CM | POA: Diagnosis not present

## 2014-11-11 DIAGNOSIS — K1379 Other lesions of oral mucosa: Secondary | ICD-10-CM | POA: Diagnosis not present

## 2014-11-18 ENCOUNTER — Telehealth: Payer: Self-pay | Admitting: Family Medicine

## 2014-11-18 NOTE — Telephone Encounter (Signed)
SPOKE WITH PATIENT AND HE IS COMING IN ON November 25 AT 1130 TO SEE DR, DAUB FOR HTN SCREENING PER [facility].  WOULD ALSO LIKE TO DISCUSS SINUS ISSUES AND MALE ISSUES AT THIS TIME.

## 2014-12-19 ENCOUNTER — Ambulatory Visit (INDEPENDENT_AMBULATORY_CARE_PROVIDER_SITE_OTHER): Payer: Medicare Other | Admitting: Emergency Medicine

## 2014-12-19 ENCOUNTER — Ambulatory Visit (INDEPENDENT_AMBULATORY_CARE_PROVIDER_SITE_OTHER): Payer: Medicare Other

## 2014-12-19 ENCOUNTER — Encounter: Payer: Self-pay | Admitting: Emergency Medicine

## 2014-12-19 VITALS — BP 132/72 | HR 80 | Temp 98.2°F | Resp 16 | Ht 74.0 in | Wt 206.0 lb

## 2014-12-19 DIAGNOSIS — R350 Frequency of micturition: Secondary | ICD-10-CM

## 2014-12-19 DIAGNOSIS — R300 Dysuria: Secondary | ICD-10-CM | POA: Diagnosis not present

## 2014-12-19 DIAGNOSIS — Z20828 Contact with and (suspected) exposure to other viral communicable diseases: Secondary | ICD-10-CM | POA: Diagnosis not present

## 2014-12-19 DIAGNOSIS — R059 Cough, unspecified: Secondary | ICD-10-CM

## 2014-12-19 DIAGNOSIS — B009 Herpesviral infection, unspecified: Secondary | ICD-10-CM

## 2014-12-19 DIAGNOSIS — R05 Cough: Secondary | ICD-10-CM

## 2014-12-19 DIAGNOSIS — Z202 Contact with and (suspected) exposure to infections with a predominantly sexual mode of transmission: Secondary | ICD-10-CM | POA: Diagnosis not present

## 2014-12-19 DIAGNOSIS — Z23 Encounter for immunization: Secondary | ICD-10-CM

## 2014-12-19 MED ORDER — SILDENAFIL CITRATE 20 MG PO TABS
ORAL_TABLET | ORAL | Status: DC
Start: 2014-12-19 — End: 2016-06-29

## 2014-12-19 MED ORDER — MONTELUKAST SODIUM 10 MG PO TABS: 10.0000 mg | ORAL_TABLET | Freq: Every day | ORAL | Status: DC

## 2014-12-19 MED ORDER — VALACYCLOVIR HCL 1 G PO TABS: ORAL_TABLET | ORAL | Status: DC

## 2014-12-19 NOTE — Progress Notes (Addendum)
This chart was scribed for Jonathan Queen, MD by Thea Alken, ED Scribe. This patient was seen in room 21 and the patient's care was started at 11:53 AM.  Chief Complaint:  Chief Complaint  Patient presents with  . Nasal Congestion    for 6 months   . wants screening for STD    new girlfriend  has new onset fever blisters   . Medication Refill    wants Sildenafil/ instead of Viagra    HPI: Jonathan Garrison is a 69 y.o. male who reports to [facility] today for STD screening .  He had negative HIV and Hep C in 03/2014. Pt has a new girlfriend with hx of Herpes I and Herpes II.  He presents today with fever blister to upper lip. States his girlfriend had a fever blister prior to his which subsided after being treated with valacyclovir. He has tried an old prescription of valacyclovir that he had left over without relief. He denies blister or sores on genitalia and penile discharge. He has noticed mild burning.  Pt would like a medication change to a generic Viagra that cost less.   Pt reports having a recurrent cough, wheeze and sinus problem for several months but states its never been bad enough for an ov.  Past Medical History  Diagnosis Date  . ADD (attention deficit disorder) without hyperactivity   . Oral cancer ([place]) 05/2010    plans for surgery 06/2010  . Eczema   . Lichen planus   . Allergy   . Polio age 72  . Arthralgia     many joints, managed with vitamins/herbs and ibuprofen  . Colitis 1986    single attack (believes was ulcerative)  . Pleurisy 1984  . Chest pain   . Umbilical hernia   . Anxiety   . Depression   . GERD (gastroesophageal reflux disease)   . Neuromuscular disorder ([place])     POLIO AGE 23  . Esophageal stricture   . Hemorrhoids    Past Surgical History  Procedure Laterality Date  . Leg surgery  when in 6th grade    L leg, bone spur removal (just above knee, laterally)  . Tongue surgery      for tongue cancer  . Colonoscopy     Social History   Social  History  . Marital Status: Divorced    Spouse Name: N/A  . Number of Children: N/A  . Years of Education: N/A   Occupational History  . 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 Smoking status: Former Smoker    Types: Pipe, Cigarettes    Quit date: 01/25/1976  . Smokeless tobacco: Never Used     Comment: Quit smoking 35 years ago  . Alcohol Use: 0.0 oz/week    0 Standard drinks or equivalent per week     Comment: socially 1-2 times per month  . Drug Use: No  . Sexual Activity: Not on file   Other Topics Concern  . Not on file   Social History Narrative   Family History  Problem Relation Age of Onset  . Hypertension Mother   . Diabetes Mother   . Hyperlipidemia Mother   . Hypertension Father   . Benign prostatic hyperplasia Father   . Heart disease Father   . Lichen planus Sister   . Cancer Sister     ?spot on chest?  . Diabetes Maternal Grandfather    Allergies  Allergen  Reactions  . Penicillins     REACTION: ?  . Sulfa Antibiotics Other (See Comments)    unknown   Prior to Admission medications   Medication Sig Start Date End Date Taking? Authorizing Provider  b complex vitamins tablet Take 0.5 tablets by mouth daily.      Historical Provider, MD  Biotin 5000 MCG CAPS Take 1 capsule by mouth 2 (two) times daily.    Historical Provider, MD  Calcium-Magnesium-Zinc [phone] MG TABS Take by mouth. 1/2 tablet daily    Historical Provider, MD  chlorpheniramine (CHLOR-TRIMETON) 4 MG tablet Take 2 mg by mouth every 6 (six) hours as needed.     Historical Provider, MD  Cholecalciferol (VITAMIN D3) 2000 UNITS capsule Take 2,000 Units by mouth daily.    Historical Provider, MD  fluticasone (FLONASE) 50 MCG/ACT nasal spray PLACE 2 SPRAYS INTO BOTH NOSTRILS DAILY "NO MORE REFILLS WITHOUT OV" 08/05/14   Ezekiel Slocumb, PA-C  glucosamine-chondroitin 500-400 MG tablet Take 1 tablet by mouth 2 (two) times daily.      Historical Provider, MD   meloxicam (MOBIC) 15 MG tablet 15 mg as needed.  03/25/13   Historical Provider, MD  Multiple Vitamins-Minerals (MULTIVITAMIN WITH MINERALS) tablet Take 1 tablet by mouth daily.      Historical Provider, MD  NON FORMULARY ALJ -herbal decongestant  Takes as needed    Historical Provider, MD  omeprazole (PRILOSEC) 40 MG capsule TAKE 1 CAPSULE BY MOUTH ONCE DAILY. TAKE 1/2 HOUR BEFORE A MEAL 08/04/14   Irene Shipper, MD  sildenafil (VIAGRA) 100 MG tablet Take 0.5-1 tablets (50-100 mg total) by mouth daily as needed for erectile dysfunction. 09/22/14   [name], MD  Zn-Pyg Afri-Nettle-Saw Palmet (SAW PALMETTO COMPLEX PO) Take 1 tablet by mouth 2 (two) times daily.    Historical Provider, MD     ROS: The patient denies fevers, chills, night sweats, unintentional weight loss, chest pain, palpitations, wheezing, dyspnea on exertion, nausea, vomiting, abdominal pain, dysuria, hematuria, melena, numbness, weakness, or tingling.   All other systems have been reviewed and were otherwise negative with the exception of those mentioned in the HPI and as above.    PHYSICAL EXAM: Filed Vitals:   12/19/14 1145  BP: 132/72  Pulse: 80  Temp: 98.2 F (36.8 C)  Resp: 16   Body mass index is 26.44 kg/(m^2).   General: Alert, no acute distress HEENT:  Normocephalic, atraumatic, oropharynx patent. Small fever blister at the corner of his mouth on the right.  Eye: [name] [facility] Cardiovascular:  Regular rate and rhythm, no rubs murmurs or gallops.  No Carotid bruits, radial pulse intact. No pedal edema.  Respiratory: Clear to auscultation bilaterally.  rhonchi.  No cyanosis, no use of accessory musculature Abdominal: No organomegaly, abdomen is soft and non-tender, positive bowel sounds.  No masses. Musculoskeletal: Gait intact. No edema, tenderness Skin: No rashes. Neurologic: Facial musculature symmetric. Psychiatric: Patient acts appropriately throughout our interaction. Lymphatic: No cervical or  submandibular lymphadenopathy Genitals normal  Meds ordered this encounter  Medications  . sildenafil (REVATIO) 20 MG tablet    Sig: Take 2-3 tablets one hour prior to intercourse    Dispense:  50 tablet    Refill:  5  . valACYclovir (VALTREX) 1000 MG tablet    Sig: Take 2 tablets twice a day for 2 doses    Dispense:  20 tablet    Refill:  0  . montelukast (SINGULAIR) 10 MG tablet    Sig: Take  1 tablet (10 mg total) by mouth at bedtime.    Dispense:  30 tablet    Refill:  11    LABS:    EKG/XRAY:   Primary read interpreted by Dr. [name] at [facility]. No acute disease. Heart size normal no infiltrates no pneumothorax.   ASSESSMENT/PLAN: Patient given Singulair For allergies and cough. STD screening was done and he was given prescriptions for sildenafil and vanciclovir.   Gross sideeffects, risk and benefits, and alternatives of medications d/w patient. Patient is aware that all medications have potential sideeffects and we are unable to predict every sideeffect or drug-drug interaction that may occur.  By signing my name below, I, Raven Small, attest that this documentation has been prepared under the direction and in the presence of Jonathan Queen, MD.  Electronically Signed: Thea Alken, ED Scribe. 12/19/2014. 12:09 PM.  Jonathan Queen MD 12/19/2014 11:40 AM

## 2014-12-19 NOTE — Patient Instructions (Signed)

## 2014-12-20 LAB — RPR

## 2014-12-20 LAB — HEPATITIS C ANTIBODY: HCV Ab: NEGATIVE

## 2014-12-20 LAB — HIV ANTIBODY (ROUTINE TESTING W REFLEX): HIV: NONREACTIVE

## 2014-12-22 LAB — GC/CHLAMYDIA PROBE AMP
CT Probe RNA: NEGATIVE
GC PROBE AMP APTIMA: NEGATIVE

## 2014-12-23 LAB — HSV(HERPES SIMPLEX VRS) I + II AB-IGG: HSV 2 Glycoprotein G Ab, IgG: 0.12 IV

## 2014-12-25 ENCOUNTER — Telehealth: Payer: Self-pay

## 2014-12-25 NOTE — Telephone Encounter (Signed)
Patient received a message from clincial staff in regards to his lab results being normal. Patient is requesting a copy of this lab work to be mailed to him at [place] [id number]. I informed patient he may be required to fill out a medical release. Per patient our office always sends him a copy every year automatically. Patient's call back number if any questions is [phone]

## 2014-12-26 NOTE — Telephone Encounter (Signed)
Mailed

## 2015-01-02 ENCOUNTER — Telehealth: Payer: Self-pay | Admitting: Family Medicine

## 2015-01-02 NOTE — Telephone Encounter (Signed)
Patient wanted to know if Dr Ladona Horns can put in his results for his breath test that he done when he was here in Nov he doesn't see it in my chart please respond

## 2015-01-03 NOTE — Telephone Encounter (Signed)
Call patient. I do not see the peak flow documented. If he wants to come back by 104 at some time and have a peak flow done we can write the number down for him.

## 2015-01-05 ENCOUNTER — Encounter: Payer: Self-pay | Admitting: Emergency Medicine

## 2015-01-05 NOTE — Telephone Encounter (Signed)
Sent mychart message

## 2015-01-19 ENCOUNTER — Telehealth: Payer: Self-pay | Admitting: Family Medicine

## 2015-01-19 NOTE — Telephone Encounter (Signed)
lmom to move his appt on Tuesday 01-20-15  To 01-22-15 at 1:00 per [facility]

## 2015-01-20 ENCOUNTER — Ambulatory Visit: Payer: Medicare Other | Admitting: Emergency Medicine

## 2015-01-22 ENCOUNTER — Ambulatory Visit (INDEPENDENT_AMBULATORY_CARE_PROVIDER_SITE_OTHER): Payer: Medicare Other | Admitting: Emergency Medicine

## 2015-01-22 ENCOUNTER — Encounter: Payer: Self-pay | Admitting: Emergency Medicine

## 2015-01-22 VITALS — BP 131/72 | HR 75 | Temp 97.9°F | Resp 16 | Ht 74.0 in | Wt 207.0 lb

## 2015-01-22 DIAGNOSIS — Z91048 Other nonmedicinal substance allergy status: Secondary | ICD-10-CM | POA: Diagnosis not present

## 2015-01-22 DIAGNOSIS — K121 Other forms of stomatitis: Secondary | ICD-10-CM

## 2015-01-22 DIAGNOSIS — Z9109 Other allergy status, other than to drugs and biological substances: Secondary | ICD-10-CM

## 2015-01-22 MED ORDER — VALACYCLOVIR HCL 1 G PO TABS: ORAL_TABLET | ORAL | Status: DC

## 2015-01-22 NOTE — Patient Instructions (Signed)
Please see your oral surgeon . in one month if the ulcer on the side of your lip has not healed. Please schedule a follow-up appointment with either Dr. Tamala Julian or Dr. Brigitte Pulse. Use the rdebrox in your right ear

## 2015-01-22 NOTE — Progress Notes (Signed)
By signing my name below, I, Moises Blood, attest that this documentation has been prepared under the direction and in the presence of [name], MD. Electronically Signed: Moises Blood, [place]. 01/22/2015 , 1:42 PM .  Patient was seen in room 22 .  Chief Complaint:  Chief Complaint  Patient presents with  . Follow-up  . Cough    cough has gone away    HPI: Jonathan Garrison is a 69 y.o. male who reports to [facility] today for follow up on his cough.  Cough During his last visit, he had a recurrent cough and wheezing, he had bad sinusitis. After a few days, he felt much better and it was resolved.   Cold sore - lip He took valacyclovir 2 tablets twice a day for 2 days. His bloodwork came back negative for herpes I and II. He still notes having a small fever sore on his lower lip. His girlfriend had bacterial infection in her genitalia. He used to smoke a pipe.   He does have a history of cancer under his tongue. He's followed by a doctor (Dr. Sherwood Gambler) at [facility].   Personal He's retired for about 4 years.   Past Medical History  Diagnosis Date  . ADD (attention deficit disorder) without hyperactivity   . Oral cancer ([place]) 05/2010    plans for surgery 06/2010  . Eczema   . Lichen planus   . Allergy   . Polio age 62  . Arthralgia     many joints, managed with vitamins/herbs and ibuprofen  . Colitis 1986    single attack (believes was ulcerative)  . Pleurisy 1984  . Chest pain   . Umbilical hernia   . Anxiety   . Depression   . GERD (gastroesophageal reflux disease)   . Neuromuscular disorder ([place])     POLIO AGE 54  . Esophageal stricture   . Hemorrhoids    Past Surgical History  Procedure Laterality Date  . Leg surgery  when in 6th grade    L leg, bone spur removal (just above knee, laterally)  . Tongue surgery      for tongue cancer  . Colonoscopy     Social History   Social History  . Marital Status: Divorced    Spouse Name: N/A  . Number of  Children: N/A  . Years of Education: N/A   Occupational History  . 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 Smoking status: Former Smoker    Types: Pipe, Cigarettes    Quit date: 01/25/1976  . Smokeless tobacco: Never Used     Comment: Quit smoking 35 years ago  . Alcohol Use: 0.0 oz/week    0 Standard drinks or equivalent per week     Comment: socially 1-2 times per month  . Drug Use: No  . Sexual Activity: Not Asked   Other Topics Concern  . None   Social History Narrative   Family History  Problem Relation Age of Onset  . Hypertension Mother   . Diabetes Mother   . Hyperlipidemia Mother   . Hypertension Father   . Benign prostatic hyperplasia Father   . Heart disease Father   . Lichen planus Sister   . Cancer Sister     ?spot on chest?  . Diabetes Maternal Grandfather    Allergies  Allergen Reactions  . Penicillins     REACTION: ?  . Sulfa Antibiotics Other (See Comments)  unknown   Prior to Admission medications   Medication Sig Start Date End Date Taking? Authorizing Provider  b complex vitamins tablet Take 0.5 tablets by mouth daily.      Historical Provider, MD  Biotin 5000 MCG CAPS Take 1 capsule by mouth 2 (two) times daily.    Historical Provider, MD  Calcium-Magnesium-Zinc [phone] MG TABS Take by mouth. 1/2 tablet daily    Historical Provider, MD  chlorpheniramine (CHLOR-TRIMETON) 4 MG tablet Take 2 mg by mouth every 6 (six) hours as needed.     Historical Provider, MD  Cholecalciferol (VITAMIN D3) 2000 UNITS capsule Take 2,000 Units by mouth daily.    Historical Provider, MD  fluticasone (FLONASE) 50 MCG/ACT nasal spray PLACE 2 SPRAYS INTO BOTH NOSTRILS DAILY "NO MORE REFILLS WITHOUT OV" 08/05/14   Ezekiel Slocumb, PA-C  glucosamine-chondroitin 500-400 MG tablet Take 1 tablet by mouth 2 (two) times daily.      Historical Provider, MD  meloxicam (MOBIC) 15 MG tablet 15 mg as needed.  03/25/13   Historical Provider,  MD  montelukast (SINGULAIR) 10 MG tablet Take 1 tablet (10 mg total) by mouth at bedtime. 12/19/14   [name], MD  Multiple Vitamins-Minerals (MULTIVITAMIN WITH MINERALS) tablet Take 1 tablet by mouth daily.      Historical Provider, MD  NON FORMULARY ALJ -herbal decongestant  Takes as needed    Historical Provider, MD  omeprazole (PRILOSEC) 40 MG capsule TAKE 1 CAPSULE BY MOUTH ONCE DAILY. TAKE 1/2 HOUR BEFORE A MEAL 08/04/14   Irene Shipper, MD  sildenafil (REVATIO) 20 MG tablet Take 2-3 tablets one hour prior to intercourse 12/19/14   [name], MD  sildenafil (VIAGRA) 100 MG tablet Take 0.5-1 tablets (50-100 mg total) by mouth daily as needed for erectile dysfunction. 09/22/14   [name], MD  valACYclovir (VALTREX) 1000 MG tablet Take 2 tablets twice a day for 2 doses 12/19/14   [name], MD  Zn-Pyg Afri-Nettle-Saw Palmet (SAW PALMETTO COMPLEX PO) Take 1 tablet by mouth 2 (two) times daily.    Historical Provider, MD     ROS:  Constitutional: negative for fever, chills, night sweats, weight changes, or fatigue  HEENT: negative for vision changes, hearing loss, congestion, rhinorrhea, ST, epistaxis, or sinus pressure; positive for ear pain, blister on lip Cardiovascular: negative for chest pain or palpitations Respiratory: negative for hemoptysis, wheezing, shortness of breath, or cough Abdominal: negative for abdominal pain, nausea, vomiting, diarrhea, or constipation Dermatological: negative for rash Neurologic: negative for headache, dizziness, or syncope All other systems reviewed and are otherwise negative with the exception to those above and in the HPI.  PHYSICAL EXAM: Filed Vitals:   01/22/15 1320  BP: 131/72  Pulse: 75  Temp: 97.9 F (36.6 C)  Resp: 16   Body mass index is 26.57 kg/(m^2).   General: Alert, no acute distress HEENT:  Normocephalic, atraumatic, oropharynx patent; cerumen impaction in right auditory canal, tiny 2-57mm ulcer at angle of the  mouth on right  Eye: [facility] Cardiovascular:  Regular rate and rhythm, no rubs murmurs or gallops.  No Carotid bruits, radial pulse intact. No pedal edema.  Respiratory: Clear to auscultation bilaterally.  No wheezes, rales, or rhonchi.  No cyanosis, no use of accessory musculature Abdominal: No organomegaly, abdomen is soft and non-tender, positive bowel sounds. No masses. Musculoskeletal: Gait intact. No edema, tenderness Skin: No rashes. Neurologic: Facial musculature symmetric. Psychiatric: Patient acts appropriately throughout our interaction.  Lymphatic: No  cervical or submandibular lymphadenopathy Genitourinary/Anorectal: No acute findings   LABS:   EKG/XRAY:   Primary read interpreted by Dr. [name] at [facility].   ASSESSMENT/PLAN:  I did refill his Valtrex. He will follow-up with his oral surgeon if he continues to have the sore area on the lip. He will use Debrox for the wax impaction of his right ear.  Gross sideeffects, risk and benefits, and alternatives of medications d/w patient. Patient is aware that all medications have potential sideeffects and we are unable to predict every sideeffect or drug-drug interaction that may occur.  [name] MD 01/22/2015 1:42 PM

## 2015-03-24 DIAGNOSIS — D692 Other nonthrombocytopenic purpura: Secondary | ICD-10-CM | POA: Diagnosis not present

## 2015-03-24 DIAGNOSIS — M71342 Other bursal cyst, left hand: Secondary | ICD-10-CM | POA: Diagnosis not present

## 2015-03-24 DIAGNOSIS — D1801 Hemangioma of skin and subcutaneous tissue: Secondary | ICD-10-CM | POA: Diagnosis not present

## 2015-03-24 DIAGNOSIS — L720 Epidermal cyst: Secondary | ICD-10-CM | POA: Diagnosis not present

## 2015-03-24 DIAGNOSIS — L821 Other seborrheic keratosis: Secondary | ICD-10-CM | POA: Diagnosis not present

## 2015-03-24 DIAGNOSIS — L738 Other specified follicular disorders: Secondary | ICD-10-CM | POA: Diagnosis not present

## 2015-03-24 DIAGNOSIS — L570 Actinic keratosis: Secondary | ICD-10-CM | POA: Diagnosis not present

## 2015-03-24 DIAGNOSIS — B078 Other viral warts: Secondary | ICD-10-CM | POA: Diagnosis not present

## 2015-03-24 DIAGNOSIS — M71341 Other bursal cyst, right hand: Secondary | ICD-10-CM | POA: Diagnosis not present

## 2015-03-24 DIAGNOSIS — D2371 Other benign neoplasm of skin of right lower limb, including hip: Secondary | ICD-10-CM | POA: Diagnosis not present

## 2015-03-24 DIAGNOSIS — D2372 Other benign neoplasm of skin of left lower limb, including hip: Secondary | ICD-10-CM | POA: Diagnosis not present

## 2015-03-24 DIAGNOSIS — K130 Diseases of lips: Secondary | ICD-10-CM | POA: Diagnosis not present

## 2015-04-06 ENCOUNTER — Encounter: Payer: Self-pay | Admitting: Emergency Medicine

## 2015-04-06 DIAGNOSIS — J918 Pleural effusion in other conditions classified elsewhere: Secondary | ICD-10-CM | POA: Diagnosis not present

## 2015-04-06 DIAGNOSIS — R918 Other nonspecific abnormal finding of lung field: Secondary | ICD-10-CM | POA: Diagnosis not present

## 2015-04-06 DIAGNOSIS — Z87891 Personal history of nicotine dependence: Secondary | ICD-10-CM | POA: Diagnosis not present

## 2015-04-06 DIAGNOSIS — Z880 Allergy status to penicillin: Secondary | ICD-10-CM | POA: Diagnosis not present

## 2015-04-06 DIAGNOSIS — J111 Influenza due to unidentified influenza virus with other respiratory manifestations: Secondary | ICD-10-CM | POA: Diagnosis not present

## 2015-04-06 DIAGNOSIS — J189 Pneumonia, unspecified organism: Secondary | ICD-10-CM | POA: Diagnosis not present

## 2015-04-06 DIAGNOSIS — J90 Pleural effusion, not elsewhere classified: Secondary | ICD-10-CM | POA: Diagnosis not present

## 2015-04-06 DIAGNOSIS — R05 Cough: Secondary | ICD-10-CM | POA: Diagnosis not present

## 2015-04-07 ENCOUNTER — Telehealth: Payer: Self-pay | Admitting: Emergency Medicine

## 2015-04-07 NOTE — Telephone Encounter (Signed)
Called girlfriend number and left message to call me.

## 2015-04-07 NOTE — Telephone Encounter (Signed)
Called Left voicemail to call back and update status.

## 2015-04-07 NOTE — Telephone Encounter (Signed)
I received a call from the after hours company last night. I spoke with the patient's partner. Mr. Alger has been suffering with high fever or myalgias and cough. I advised him to go to the emergency room for evaluation and decision regarding medications. I told him I was concerned about pneumonia and wanted him to be checked. The patient was in [place]. Please call and be sure the patient was seen and evaluated.

## 2015-04-08 NOTE — Telephone Encounter (Signed)
He has an appt tomorrow Dr. [name].

## 2015-04-09 ENCOUNTER — Ambulatory Visit (INDEPENDENT_AMBULATORY_CARE_PROVIDER_SITE_OTHER): Payer: Medicare Other | Admitting: Emergency Medicine

## 2015-04-09 ENCOUNTER — Encounter: Payer: Self-pay | Admitting: Emergency Medicine

## 2015-04-09 VITALS — BP 124/77 | HR 86 | Temp 98.7°F | Resp 16 | Ht 74.0 in | Wt 205.5 lb

## 2015-04-09 DIAGNOSIS — Z9109 Other allergy status, other than to drugs and biological substances: Secondary | ICD-10-CM

## 2015-04-09 DIAGNOSIS — R05 Cough: Secondary | ICD-10-CM

## 2015-04-09 DIAGNOSIS — Z91048 Other nonmedicinal substance allergy status: Secondary | ICD-10-CM | POA: Diagnosis not present

## 2015-04-09 DIAGNOSIS — J189 Pneumonia, unspecified organism: Secondary | ICD-10-CM | POA: Diagnosis not present

## 2015-04-09 DIAGNOSIS — R059 Cough, unspecified: Secondary | ICD-10-CM

## 2015-04-09 MED ORDER — FLUTICASONE PROPIONATE 50 MCG/ACT NA SUSP: NASAL | Status: DC

## 2015-04-09 NOTE — Progress Notes (Signed)
Patient ID: Jonathan Garrison, male   DOB: 12-21-1945, 70 y.o.   MRN: [id number]    By signing my name below, I, Essence Howell, attest that this documentation has been prepared under the direction and in the presence of [name], MD Electronically Signed: Ladene Artist, ED Scribe 04/09/2015 at 12:10 PM.  Chief Complaint:  Chief Complaint  Patient presents with  . Follow-up    flu and possible pneumonia   HPI: Jonathan Garrison is a 70 y.o. male who reports to [facility] today for a follow-up regarding hospitalization approximately 6 days ago. Pt was discharged with z-pak for suspected PNA which he states has improved SOB, fever and cough. He states that his fever was down to 99.2 F this morning and 98.7 F during triage. Today, he states that he is still experiencing mild cough, bilateral ear pain, congestion and clear rhinorrhea. He has been compliant with z-pak and used saline in his ears with significant relief.   Past Medical History  Diagnosis Date  . ADD (attention deficit disorder) without hyperactivity   . 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 Eczema   . Lichen planus   . Allergy   . Polio age 41  . Arthralgia     many joints, managed with vitamins/herbs and ibuprofen  . Colitis 1986    single attack (believes was ulcerative)  . Pleurisy 1984  . Chest pain   . Umbilical hernia   . Anxiety   . Depression   . GERD (gastroesophageal reflux disease)   . Neuromuscular disorder ([place])     POLIO AGE 28  . Esophageal stricture   . Hemorrhoids    Past Surgical History  Procedure Laterality Date  . Leg surgery  when in 6th grade    L leg, bone spur removal (just above knee, laterally)  . Tongue surgery      for tongue cancer  . Colonoscopy     Social History   Social History  . Marital Status: Divorced    Spouse Name: N/A  . Number of Children: N/A  . Years of Education: N/A   Occupational History  . 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 Smoking status: Former Smoker    Types: Pipe, Cigarettes    Quit date: 01/25/1976  . Smokeless tobacco: Never Used     Comment: Quit smoking 35 years ago  . Alcohol Use: 0.0 oz/week    0 Standard drinks or equivalent per week     Comment: socially 1-2 times per month  . Drug Use: No  . Sexual Activity: Not Asked   Other Topics Concern  . None   Social History Narrative   Family History  Problem Relation Age of Onset  . Hypertension Mother   . Diabetes Mother   . Hyperlipidemia Mother   . Hypertension Father   . Benign prostatic hyperplasia Father   . Heart disease Father   . Lichen planus Sister   . Cancer Sister     ?spot on chest?  . Diabetes Maternal Grandfather    Allergies  Allergen Reactions  . Penicillins     REACTION: ?  . Sulfa Antibiotics Other (See Comments)    unknown   Prior to Admission medications   Medication Sig Start Date End Date Taking? Authorizing Provider  b complex vitamins tablet Take 0.5 tablets by mouth daily.     Yes Historical Provider, MD  Biotin 5000 MCG CAPS Take 1 capsule by mouth 2 (two) times daily.   Yes Historical Provider, MD  Calcium-Magnesium-Zinc [phone] MG TABS Take by mouth. 1/2 tablet daily   Yes Historical Provider, MD  chlorpheniramine (CHLOR-TRIMETON) 4 MG tablet Take 2 mg by mouth every 6 (six) hours as needed.    Yes Historical Provider, MD  Cholecalciferol (VITAMIN D3) 2000 UNITS capsule Take 2,000 Units by mouth daily.   Yes Historical Provider, MD  fluticasone (FLONASE) 50 MCG/ACT nasal spray PLACE 2 SPRAYS INTO BOTH NOSTRILS DAILY "NO MORE REFILLS WITHOUT OV" 08/05/14  Yes Ezekiel Slocumb, PA-C  glucosamine-chondroitin 500-400 MG tablet Take 1 tablet by mouth 2 (two) times daily.     Yes Historical Provider, MD  meloxicam (MOBIC) 15 MG tablet 15 mg as needed.  03/25/13  Yes Historical Provider, MD  montelukast (SINGULAIR) 10 MG tablet Take 1 tablet (10 mg total) by mouth at bedtime.  12/19/14  Yes [name], MD  Multiple Vitamins-Minerals (MULTIVITAMIN WITH MINERALS) tablet Take 1 tablet by mouth daily.     Yes Historical Provider, MD  NON FORMULARY ALJ -herbal decongestant  Takes as needed   Yes Historical Provider, MD  omeprazole (PRILOSEC) 40 MG capsule TAKE 1 CAPSULE BY MOUTH ONCE DAILY. TAKE 1/2 HOUR BEFORE A MEAL 08/04/14  Yes Irene Shipper, MD  sildenafil (REVATIO) 20 MG tablet Take 2-3 tablets one hour prior to intercourse 12/19/14  Yes [name], MD  Zn-Pyg Afri-Nettle-Saw Palmet (SAW PALMETTO COMPLEX PO) Take 1 tablet by mouth 2 (two) times daily.   Yes Historical Provider, MD  sildenafil (VIAGRA) 100 MG tablet Take 0.5-1 tablets (50-100 mg total) by mouth daily as needed for erectile dysfunction. Patient not taking: Reported on 04/09/2015 09/22/14   [name], MD  valACYclovir (VALTREX) 1000 MG tablet Take 1 tablet twice a day for 3 days Patient not taking: Reported on 04/09/2015 01/22/15   [name], MD   ROS: The patient denies fevers, chills, night sweats, unintentional weight loss, chest pain, palpitations, wheezing, -SOB, nausea, vomiting, abdominal pain, dysuria, hematuria, melena, numbness, weakness, or tingling. +ear pain, +rhinorrhea, +congestion, +cough  All other systems have been reviewed and were otherwise negative with the exception of those mentioned in the HPI and as above.    PHYSICAL EXAM: Filed Vitals:   04/09/15 1147  BP: 124/77  Pulse: 86  Temp: 98.7 F (37.1 C)  Resp: 16   Body mass index is 26.38 kg/(m^2).  General: Alert, no acute distress HEENT:  Normocephalic, atraumatic, oropharynx patent. Small amount of rhinorrhea. Normal TMs.  Eye: [name] [facility] Cardiovascular: Regular rate and rhythm, no rubs murmurs or gallops. No Carotid bruits, radial pulse intact. No pedal edema.  Respiratory: Clear to auscultation bilaterally. No wheezes, rales, or rhonchi. No cyanosis, no use of accessory musculature Abdominal: No  organomegaly, abdomen is soft and non-tender, positive bowel sounds. No masses. Musculoskeletal: Gait intact. No edema, tenderness Skin: No rashes. Neurologic: Facial musculature symmetric. Psychiatric: Patient acts appropriately throughout our interaction. Lymphatic: No cervical or submandibular lymphadenopathy  LABS:  EKG/XRAY:   Primary read interpreted by [facility]. [name] at [facility].  ASSESSMENT/PLAN: Patient seen in the emergency room at [facility] diagnosed with flu and hospital early infiltrate. Currently on a Z-Pak. He is feeling markedly better with his fever resolved and decreased cough. No change in treatment plan. He is advised to finish out his Zithromax.I personally performed the services described in this documentation, which was scribed in my  presence. The recorded information has been reviewed and is accurate.    Gross sideeffects, risk and benefits, and alternatives of medications d/w patient. Patient is aware that all medications have potential sideeffects and we are unable to predict every sideeffect or drug-drug interaction that may occur.  [name] MD 04/09/2015 12:02 PM

## 2015-06-10 DIAGNOSIS — H40013 Open angle with borderline findings, low risk, bilateral: Secondary | ICD-10-CM | POA: Diagnosis not present

## 2015-06-10 DIAGNOSIS — H04123 Dry eye syndrome of bilateral lacrimal glands: Secondary | ICD-10-CM | POA: Diagnosis not present

## 2015-06-10 DIAGNOSIS — H43813 Vitreous degeneration, bilateral: Secondary | ICD-10-CM | POA: Diagnosis not present

## 2015-06-18 ENCOUNTER — Encounter: Payer: Self-pay | Admitting: Emergency Medicine

## 2015-06-18 ENCOUNTER — Other Ambulatory Visit: Payer: Self-pay | Admitting: Emergency Medicine

## 2015-06-18 ENCOUNTER — Ambulatory Visit (INDEPENDENT_AMBULATORY_CARE_PROVIDER_SITE_OTHER): Payer: Medicare Other | Admitting: Emergency Medicine

## 2015-06-18 VITALS — BP 130/84 | HR 75 | Temp 97.9°F | Resp 16 | Ht 74.0 in | Wt 206.4 lb

## 2015-06-18 DIAGNOSIS — M159 Polyosteoarthritis, unspecified: Secondary | ICD-10-CM

## 2015-06-18 DIAGNOSIS — R635 Abnormal weight gain: Secondary | ICD-10-CM

## 2015-06-18 DIAGNOSIS — M150 Primary generalized (osteo)arthritis: Secondary | ICD-10-CM | POA: Diagnosis not present

## 2015-06-18 DIAGNOSIS — H9193 Unspecified hearing loss, bilateral: Secondary | ICD-10-CM

## 2015-06-18 DIAGNOSIS — Z91048 Other nonmedicinal substance allergy status: Secondary | ICD-10-CM | POA: Diagnosis not present

## 2015-06-18 DIAGNOSIS — F329 Major depressive disorder, single episode, unspecified: Secondary | ICD-10-CM | POA: Diagnosis not present

## 2015-06-18 DIAGNOSIS — B009 Herpesviral infection, unspecified: Secondary | ICD-10-CM | POA: Diagnosis not present

## 2015-06-18 DIAGNOSIS — L608 Other nail disorders: Secondary | ICD-10-CM

## 2015-06-18 DIAGNOSIS — L609 Nail disorder, unspecified: Secondary | ICD-10-CM | POA: Diagnosis not present

## 2015-06-18 DIAGNOSIS — M2042 Other hammer toe(s) (acquired), left foot: Secondary | ICD-10-CM

## 2015-06-18 DIAGNOSIS — E785 Hyperlipidemia, unspecified: Secondary | ICD-10-CM | POA: Diagnosis not present

## 2015-06-18 DIAGNOSIS — M2041 Other hammer toe(s) (acquired), right foot: Secondary | ICD-10-CM | POA: Diagnosis not present

## 2015-06-18 DIAGNOSIS — Z1211 Encounter for screening for malignant neoplasm of colon: Secondary | ICD-10-CM

## 2015-06-18 DIAGNOSIS — Z9109 Other allergy status, other than to drugs and biological substances: Secondary | ICD-10-CM

## 2015-06-18 DIAGNOSIS — F32A Depression, unspecified: Secondary | ICD-10-CM

## 2015-06-18 DIAGNOSIS — Z0000 Encounter for general adult medical examination without abnormal findings: Secondary | ICD-10-CM

## 2015-06-18 DIAGNOSIS — K219 Gastro-esophageal reflux disease without esophagitis: Secondary | ICD-10-CM

## 2015-06-18 LAB — CBC WITH DIFFERENTIAL/PLATELET
BASOS PCT: 1 %
Basophils Absolute: 45 cells/uL (ref 0–200)
EOS ABS: 540 {cells}/uL — AB (ref 15–500)
Eosinophils Relative: 12 %
HEMATOCRIT: 39.3 % (ref 38.5–50.0)
HEMOGLOBIN: 13.2 g/dL (ref 13.2–17.1)
LYMPHS ABS: 1350 {cells}/uL (ref 850–3900)
Lymphocytes Relative: 30 %
MCH: 29.9 pg (ref 27.0–33.0)
MCHC: 33.6 g/dL (ref 32.0–36.0)
MCV: 88.9 fL (ref 80.0–100.0)
MONO ABS: 450 {cells}/uL (ref 200–950)
MPV: 10 fL (ref 7.5–12.5)
Monocytes Relative: 10 %
NEUTROS ABS: 2115 {cells}/uL (ref 1500–7800)
Neutrophils Relative %: 47 %
Platelets: 249 10*3/uL (ref 140–400)
RBC: 4.42 MIL/uL (ref 4.20–5.80)
RDW: 14.4 % (ref 11.0–15.0)
WBC: 4.5 10*3/uL (ref 3.8–10.8)

## 2015-06-18 LAB — LIPID PANEL
CHOLESTEROL: 172 mg/dL (ref 125–200)
HDL: 96 mg/dL (ref >=40)
LDL Cholesterol: 69 mg/dL (ref <130)
TRIGLYCERIDES: 34 mg/dL (ref <150)
Total CHOL/HDL Ratio: 1.8 Ratio (ref <=5.0)
VLDL: 7 mg/dL (ref <30)

## 2015-06-18 LAB — POCT URINALYSIS DIP (MANUAL ENTRY)
BILIRUBIN UA: NEGATIVE
BILIRUBIN UA: NEGATIVE
Blood, UA: NEGATIVE
Glucose, UA: NEGATIVE
LEUKOCYTES UA: NEGATIVE
Nitrite, UA: NEGATIVE
PH UA: 6
Protein Ur, POC: NEGATIVE
SPEC GRAV UA: 1.01
Urobilinogen, UA: 0.2

## 2015-06-18 LAB — COMPLETE METABOLIC PANEL WITH GFR
ALT: 21 U/L (ref 9–46)
AST: 25 U/L (ref 10–35)
Albumin: 4 g/dL (ref 3.6–5.1)
Alkaline Phosphatase: 39 U/L — ABNORMAL LOW (ref 40–115)
BILIRUBIN TOTAL: 1.4 mg/dL — AB (ref 0.2–1.2)
BUN: 20 mg/dL (ref 7–25)
CALCIUM: 9.1 mg/dL (ref 8.6–10.3)
CHLORIDE: 105 mmol/L (ref 98–110)
CO2: 26 mmol/L (ref 20–31)
CREATININE: 0.87 mg/dL (ref 0.70–1.25)
GFR, Est African American: >89 mL/min (ref >=60)
GFR, Est Non African American: 89 mL/min (ref >=60)
Glucose, Bld: 86 mg/dL (ref 65–99)
Potassium: 4.4 mmol/L (ref 3.5–5.3)
Sodium: 141 mmol/L (ref 135–146)
TOTAL PROTEIN: 6.6 g/dL (ref 6.1–8.1)

## 2015-06-18 LAB — POC MICROSCOPIC URINALYSIS (UMFC): MUCUS RE: ABSENT

## 2015-06-18 LAB — TSH: TSH: 1.12 m[IU]/L (ref 0.40–4.50)

## 2015-06-18 MED ORDER — VALACYCLOVIR HCL 1 G PO TABS
ORAL_TABLET | ORAL | Status: DC
Start: 2015-06-18 — End: 2016-02-09

## 2015-06-18 MED ORDER — FLUTICASONE PROPIONATE 50 MCG/ACT NA SUSP: NASAL | Status: DC

## 2015-06-18 MED ORDER — MONTELUKAST SODIUM 10 MG PO TABS: 10.0000 mg | ORAL_TABLET | Freq: Every day | ORAL | Status: DC

## 2015-06-18 NOTE — Progress Notes (Signed)
By signing my name below, I, Jonathan Garrison, attest that this documentation has been prepared under the direction and in the presence of Jonathan Jordan, MD. Electronically Signed: Judithe Garrison, ER Scribe. 06/18/2015. 10:10 AM.  Chief Complaint:  Chief Complaint  Patient presents with  . Annual Exam  . Medication Refill    Flonase   HPI: Jonathan Garrison is a 70 y.o. male with a past hx of tongue cancer who reports to [facility] today reporting for a full physical. His girlfriend has esophogeal cancer and a past hx of esophagectomy on year ago, and since the procedure she has been nauseous. He has not been thinking about his own health much due to his girlfriends health problems. He has been feeling somewhat emotional due to his girlfriends health issues. He has gained some weight due to reduced exercise since he has been caring for his girlfriend. He would like to go back to the ear doctor he saw previously because he is noting some hearing loss. He had a recent visit with his HENT oncologist, and everything was normal. He sees that doctor every six months.   He is also complaining of bilateral hammer toes with associated foot pain, worse in left foot. He continues to have some lower back pain. He would like to see an orthopedist for theses issues.   He is also complaining of continued pain under his finger nails and would like to see Dr. Kayleen Garrison.  Past Medical History  Diagnosis Date  . ADD (attention deficit disorder) without hyperactivity   . 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 Eczema   . Lichen planus   . Allergy   . Polio age 21  . Arthralgia     many joints, managed with vitamins/herbs and ibuprofen  . Colitis 1986    single attack (believes was ulcerative)  . Pleurisy 1984  . Chest pain   . Umbilical hernia   . Anxiety   . Depression   . GERD (gastroesophageal reflux disease)   . Neuromuscular disorder ([employer])     POLIO AGE 48  . Esophageal stricture   .  Hemorrhoids    Past Surgical History  Procedure Laterality Date  . Leg surgery  when in 6th grade    L leg, bone spur removal (just above knee, laterally)  . Tongue surgery      for tongue cancer  . Colonoscopy     Social History   Social History  . Marital Status: Divorced    Spouse Name: N/A  . Number of Children: N/A  . Years of Education: N/A   Occupational History  . 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 Smoking status: Former Smoker    Types: Pipe, Cigarettes    Quit date: 01/25/1976  . Smokeless tobacco: Never Used     Comment: Quit smoking 35 years ago  . Alcohol Use: 0.0 oz/week    0 Standard drinks or equivalent per week     Comment: socially 1-2 times per month  . Drug Use: No  . Sexual Activity: Not Asked   Other Topics Concern  . None   Social History Narrative   Family History  Problem Relation Age of Onset  . Hypertension Mother   . Diabetes Mother   . Hyperlipidemia Mother   . Hypertension Father   . Benign prostatic hyperplasia Father   . Heart disease Father   .  Lichen planus Sister   . Cancer Sister     ?spot on chest?  . Diabetes Maternal Grandfather    Allergies  Allergen Reactions  . Penicillins     REACTION: ?  . Sulfa Antibiotics Other (See Comments)    unknown   Prior to Admission medications   Medication Sig Start Date End Date Taking? Authorizing Provider  b complex vitamins tablet Take 0.5 tablets by mouth daily.      Historical Provider, MD  Biotin 5000 MCG CAPS Take 1 capsule by mouth 2 (two) times daily.    Historical Provider, MD  Calcium-Magnesium-Zinc [phone] MG TABS Take by mouth. 1/2 tablet daily    Historical Provider, MD  chlorpheniramine (CHLOR-TRIMETON) 4 MG tablet Take 2 mg by mouth every 6 (six) hours as needed.     Historical Provider, MD  Cholecalciferol (VITAMIN D3) 2000 UNITS capsule Take 2,000 Units by mouth daily.    Historical Provider, MD  fluticasone  (FLONASE) 50 MCG/ACT nasal spray PLACE 2 SPRAYS INTO BOTH NOSTRILS DAILY "NO MORE REFILLS WITHOUT OV" 04/09/15   [name], MD  glucosamine-chondroitin 500-400 MG tablet Take 1 tablet by mouth 2 (two) times daily.      Historical Provider, MD  meloxicam (MOBIC) 15 MG tablet 15 mg as needed.  03/25/13   Historical Provider, MD  montelukast (SINGULAIR) 10 MG tablet Take 1 tablet (10 mg total) by mouth at bedtime. 12/19/14   [name], MD  Multiple Vitamins-Minerals (MULTIVITAMIN WITH MINERALS) tablet Take 1 tablet by mouth daily.      Historical Provider, MD  NON FORMULARY ALJ -herbal decongestant  Takes as needed    Historical Provider, MD  omeprazole (PRILOSEC) 40 MG capsule TAKE 1 CAPSULE BY MOUTH ONCE DAILY. TAKE 1/2 HOUR BEFORE A MEAL 08/04/14   Irene Shipper, MD  sildenafil (REVATIO) 20 MG tablet Take 2-3 tablets one hour prior to intercourse 12/19/14   [name], MD  sildenafil (VIAGRA) 100 MG tablet Take 0.5-1 tablets (50-100 mg total) by mouth daily as needed for erectile dysfunction. Patient not taking: Reported on 04/09/2015 09/22/14   [name], MD  valACYclovir (VALTREX) 1000 MG tablet Take 1 tablet twice a day for 3 days Patient not taking: Reported on 04/09/2015 01/22/15   [name], MD  Zn-Pyg Afri-Nettle-Saw Palmet (SAW PALMETTO COMPLEX PO) Take 1 tablet by mouth 2 (two) times daily.    Historical Provider, MD     ROS: The patient denies fevers, chills, night sweats, unintentional weight loss, chest pain, palpitations, wheezing, dyspnea on exertion, nausea, vomiting, abdominal pain, dysuria, hematuria, melena, numbness, weakness, or tingling.   All other systems have been reviewed and were otherwise negative with the exception of those mentioned in the HPI and as above.    PHYSICAL EXAM: Filed Vitals:   06/18/15 0928  BP: 130/84  Pulse: 75  Temp: 97.9 F (36.6 C)  Resp: 16   Body mass index is 26.49 kg/(m^2).   General: Alert, no acute distress HEENT:   Normocephalic, atraumatic, oropharynx patent. Significant cerumen in right ear. Tongue has no obvious lesions. No cervical adenopathy. Eye: [name] [facility] Cardiovascular:  Regular rate and rhythm, no rubs murmurs or gallops.  No Carotid bruits, radial pulse intact. No pedal edema.  Respiratory: Clear to auscultation bilaterally.  No wheezes, rales, or rhonchi.  No cyanosis, no use of accessory musculature Abdominal: No organomegaly, abdomen is soft and non-tender, positive bowel sounds.  No masses. Abdomen with 1cm umbilical  hernia.  Musculoskeletal: Gait intact. No edema, tenderness. Hammer toe deformity of right foot. Resolving subungual hematoma left great toe. 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 Skin: No rashes. A few scaly red areas on the top of head.  Neurologic: Facial musculature symmetric.  Psychiatric: Patient acts appropriately throughout our interaction. Lymphatic: No cervical or submandibular lymphadenopathy Anorectal: Normal prostate without nodularity.  LABS:   EKG/XRAY:   Primary read interpreted by Dr. [name] at [facility].   ASSESSMENT/PLAN: Patient's biggest issue is feeling sad and depressed regarding his partners health. She is dealing with esophageal cancer and weight loss as well as renal lesions and he is concerned about this. He spends half of his time here in half with her at her home. His medical problems have been relatively stable. Referral made to Dr. Thornell Mule regarding his hearing loss. Referral made to dermatology regarding his nail deformity. 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 He has been cleared by the cardiologist read regarding cardiac issues. He has a GI specialist he sees regularly.I personally performed the services described in this documentation, which was scribed in my presence. The recorded information has been reviewed and is accurate.   Gross sideeffects, risk and benefits, and  alternatives of medications d/w patient. Patient is aware that all medications have potential sideeffects and we are unable to predict every sideeffect or drug-drug interaction that may occur.  [name] MD 06/18/2015 10:10 AM

## 2015-06-18 NOTE — Patient Instructions (Addendum)
Please let me know if you need referral to a counselor to discuss your symptoms of depression. I have refilled her medications. I have made referrals to dermatology, ear specialist, and orthopedics.    IF you received an x-ray today, you will receive an invoice from [facility]. Please contact [facility] at [phone] with questions or concerns regarding your invoice.   IF you received labwork today, you will receive an invoice from [employer]. Please contact [facility] at [phone] with questions or concerns regarding your invoice.   Our billing staff will not be able to assist you with questions regarding bills from these companies.  You will be contacted with the lab results as soon as they are available. The fastest way to get your results is to activate your My Chart account. Instructions are located on the last page of this paperwork. If you have not heard from [place] regarding the results in 2 weeks, please contact this office.    Health Maintenance, Male A healthy lifestyle and preventative care can promote health and wellness.  Maintain regular health, dental, and eye exams.  Eat a healthy diet. Foods like vegetables, fruits, whole grains, low-fat dairy products, and lean protein foods contain the nutrients you need and are low in calories. Decrease your intake of foods high in solid fats, added sugars, and salt. Get information about a proper diet from your health care provider, if necessary.  Regular physical exercise is one of the most important things you can do for your health. Most adults should get at least 150 minutes of moderate-intensity exercise (any activity that increases your heart rate and causes you to sweat) each week. In addition, most adults need muscle-strengthening exercises on 2 or more days a week.   Maintain a healthy weight. The body mass index (BMI) is a screening tool to identify possible weight problems. It  provides an estimate of body fat based on height and weight. Your health care provider can find your BMI and can help you achieve or maintain a healthy weight. For males 20 years and older:  A BMI below 18.5 is considered underweight.  A BMI of 18.5 to 24.9 is normal.  A BMI of 25 to 29.9 is considered overweight.  A BMI of 30 and above is considered obese.  Maintain normal blood lipids and cholesterol by exercising and minimizing your intake of saturated fat. Eat a balanced diet with plenty of fruits and vegetables. Blood tests for lipids and cholesterol should begin at age 23 and be repeated every 5 years. If your lipid or cholesterol levels are high, you are over age 52, or you are at high risk for heart disease, you may need your cholesterol levels checked more frequently.Ongoing high lipid and cholesterol levels should be treated with medicines if diet and exercise are not working.  If you smoke, find out from your health care provider how to quit. If you do not use tobacco, do not start.  Lung cancer screening is recommended for adults aged 24-80 years who are at high risk for developing lung cancer because of a history of smoking. A yearly low-dose CT scan of the lungs is recommended for people who have at least a 30-pack-year history of smoking and are current smokers or have quit within the past 15 years. A pack year of smoking is smoking an average of 1 pack of cigarettes a day for 1 year (for example, a 30-pack-year history of smoking could mean smoking 1 pack a  day for 30 years or 2 packs a day for 15 years). Yearly screening should continue until the smoker has stopped smoking for at least 15 years. Yearly screening should be stopped for people who develop a health problem that would prevent them from having lung cancer treatment.  If you choose to drink alcohol, do not have more than 2 drinks per day. One drink is considered to be 12 oz (360 mL) of beer, 5 oz (150 mL) of wine, or 1.5  oz (45 mL) of liquor.  Avoid the use of street drugs. Do not share needles with anyone. Ask for help if you need support or instructions about stopping the use of drugs.  High blood pressure causes heart disease and increases the risk of stroke. High blood pressure is more likely to develop in:  People who have blood pressure in the end of the normal range (100-139/85-89 mm Hg).  People who are overweight or obese.  People who are African American.  If you are 38-102 years of age, have your blood pressure checked every 3-5 years. If you are 16 years of age or older, have your blood pressure checked every year. You should have your blood pressure measured twice--once when you are at a hospital or clinic, and once when you are not at a hospital or clinic. Record the average of the two measurements. To check your blood pressure when you are not at a hospital or clinic, you can use:  An automated blood pressure machine at a pharmacy.  A home blood pressure monitor.  If you are 27-25 years old, ask your health care provider if you should take aspirin to prevent heart disease.  Diabetes screening involves taking a blood sample to check your fasting blood sugar level. This should be done once every 3 years after age 37 if you are at a normal weight and without risk factors for diabetes. Testing should be considered at a younger age or be carried out more frequently if you are overweight and have at least 1 risk factor for diabetes.  Colorectal cancer can be detected and often prevented. Most routine colorectal cancer screening begins at the age of 23 and continues through age 49. However, your health care provider may recommend screening at an earlier age if you have risk factors for colon cancer. On a yearly basis, your health care provider may provide home test kits to check for hidden blood in the stool. A small camera at the end of a tube may be used to directly examine the colon (sigmoidoscopy or  colonoscopy) to detect the earliest forms of colorectal cancer. Talk to your health care provider about this at age 92 when routine screening begins. A direct exam of the colon should be repeated every 5-10 years through age 70, unless early forms of precancerous polyps or small growths are found.  People who are at an increased risk for hepatitis B should be screened for this virus. You are considered at high risk for hepatitis B if:  You were born in a country where hepatitis B occurs often. Talk with your health care provider about which countries are considered high risk.  Your parents were born in a high-risk country and you have not received a shot to protect against hepatitis B (hepatitis B vaccine).  You have HIV or AIDS.  You use needles to inject street drugs.  You live with, or have sex with, someone who has hepatitis B.  You are a man who has  sex with other men (MSM).  You get hemodialysis treatment.  You take certain medicines for conditions like cancer, organ transplantation, and autoimmune conditions.  Hepatitis C blood testing is recommended for all people born from [birth year] through 1965 and any individual with known risk factors for hepatitis C.  Healthy men should no longer receive prostate-specific antigen (PSA) blood tests as part of routine cancer screening. Talk to your health care provider about prostate cancer screening.  Testicular cancer screening is not recommended for adolescents or adult males who have no symptoms. Screening includes self-exam, a health care provider exam, and other screening tests. Consult with your health care provider about any symptoms you have or any concerns you have about testicular cancer.  Practice safe sex. Use condoms and avoid high-risk sexual practices to reduce the spread of sexually transmitted infections (STIs).  You should be screened for STIs, including gonorrhea and chlamydia if:  You are sexually active and are younger than  24 years.  You are older than 24 years, and your health care provider tells you that you are at risk for this type of infection.  Your sexual activity has changed since you were last screened, and you are at an increased risk for chlamydia or gonorrhea. Ask your health care provider if you are at risk.  If you are at risk of being infected with HIV, it is recommended that you take a prescription medicine daily to prevent HIV infection. This is called pre-exposure prophylaxis (PrEP). You are considered at risk if:  You are a man who has sex with other men (MSM).  You are a heterosexual man who is sexually active with multiple partners.  You take drugs by injection.  You are sexually active with a partner who has HIV.  Talk with your health care provider about whether you are at high risk of being infected with HIV. If you choose to begin PrEP, you should first be tested for HIV. You should then be tested every 3 months for as long as you are taking PrEP.  Use sunscreen. Apply sunscreen liberally and repeatedly throughout the day. You should seek shade when your shadow is shorter than you. Protect yourself by wearing long sleeves, pants, a wide-brimmed hat, and sunglasses year round whenever you are outdoors.  Tell your health care provider of new moles or changes in moles, especially if there is a change in shape or color. Also, tell your health care provider if a mole is larger than the size of a pencil eraser.  A one-time screening for abdominal aortic aneurysm (AAA) and surgical repair of large AAAs by ultrasound is recommended for men aged 42-75 years who are current or former smokers.  Stay current with your vaccines (immunizations).   This information is not intended to replace advice given to you by your health care provider. Make sure you discuss any questions you have with your health care provider.   Document Released: 07/09/2007 Document Revised: 01/31/2014 Document Reviewed:  06/07/2010 Elsevier Interactive Patient Education [employer].

## 2015-06-19 LAB — BILIRUBIN, FRACTIONATED(TOT/DIR/INDIR)
BILIRUBIN DIRECT: 0.3 mg/dL — AB (ref <=0.2)
BILIRUBIN INDIRECT: 1.1 mg/dL (ref 0.2–1.2)
BILIRUBIN TOTAL: 1.4 mg/dL — AB (ref 0.2–1.2)

## 2015-06-19 LAB — PSA: PSA: 2.68 ng/mL (ref <=4.00)

## 2015-07-03 ENCOUNTER — Encounter: Payer: Self-pay | Admitting: Emergency Medicine

## 2015-07-03 ENCOUNTER — Encounter: Payer: Self-pay | Admitting: Radiology

## 2015-07-06 ENCOUNTER — Other Ambulatory Visit: Payer: Self-pay | Admitting: Emergency Medicine

## 2015-07-06 DIAGNOSIS — Q667 Congenital pes cavus: Secondary | ICD-10-CM | POA: Diagnosis not present

## 2015-07-06 DIAGNOSIS — M542 Cervicalgia: Secondary | ICD-10-CM | POA: Diagnosis not present

## 2015-07-06 DIAGNOSIS — R17 Unspecified jaundice: Secondary | ICD-10-CM

## 2015-07-06 DIAGNOSIS — R972 Elevated prostate specific antigen [PSA]: Secondary | ICD-10-CM

## 2015-07-06 DIAGNOSIS — R945 Abnormal results of liver function studies: Secondary | ICD-10-CM

## 2015-07-06 DIAGNOSIS — M545 Low back pain: Secondary | ICD-10-CM | POA: Diagnosis not present

## 2015-07-06 DIAGNOSIS — R7989 Other specified abnormal findings of blood chemistry: Secondary | ICD-10-CM

## 2015-07-14 DIAGNOSIS — M542 Cervicalgia: Secondary | ICD-10-CM | POA: Diagnosis not present

## 2015-07-14 DIAGNOSIS — M545 Low back pain: Secondary | ICD-10-CM | POA: Diagnosis not present

## 2015-07-19 ENCOUNTER — Encounter: Payer: Self-pay | Admitting: Emergency Medicine

## 2015-07-22 ENCOUNTER — Ambulatory Visit
Admission: RE | Admit: 2015-07-22 | Discharge: 2015-07-22 | Disposition: A | Discharge status: Home / Self Care | Payer: Medicare Other | Source: Ambulatory Visit | Attending: Emergency Medicine | Admitting: Emergency Medicine

## 2015-07-22 DIAGNOSIS — R17 Unspecified jaundice: Secondary | ICD-10-CM

## 2015-07-22 DIAGNOSIS — R972 Elevated prostate specific antigen [PSA]: Secondary | ICD-10-CM

## 2015-07-22 DIAGNOSIS — N281 Cyst of kidney, acquired: Secondary | ICD-10-CM | POA: Diagnosis not present

## 2015-07-22 DIAGNOSIS — R945 Abnormal results of liver function studies: Secondary | ICD-10-CM

## 2015-07-22 DIAGNOSIS — R7989 Other specified abnormal findings of blood chemistry: Secondary | ICD-10-CM

## 2015-07-22 MED ORDER — IOPAMIDOL (ISOVUE-300) INJECTION 61%
125.0000 mL | Freq: Once | INTRAVENOUS | Status: AC | PRN
  Administered 2015-07-22: 125 mL via INTRAVENOUS

## 2015-07-24 DIAGNOSIS — L609 Nail disorder, unspecified: Secondary | ICD-10-CM | POA: Diagnosis not present

## 2015-07-24 DIAGNOSIS — B078 Other viral warts: Secondary | ICD-10-CM | POA: Diagnosis not present

## 2015-07-24 DIAGNOSIS — L603 Nail dystrophy: Secondary | ICD-10-CM | POA: Diagnosis not present

## 2015-07-24 DIAGNOSIS — L570 Actinic keratosis: Secondary | ICD-10-CM | POA: Diagnosis not present

## 2015-07-31 ENCOUNTER — Telehealth: Payer: Self-pay

## 2015-07-31 NOTE — Telephone Encounter (Signed)
Patient stated he is returning phone call concerning results. [phone]

## 2015-08-03 NOTE — Telephone Encounter (Signed)
Called pt, advised CT results. Pt understood.

## 2015-08-05 ENCOUNTER — Encounter: Payer: Self-pay | Admitting: *Deleted

## 2015-08-20 ENCOUNTER — Other Ambulatory Visit: Payer: Self-pay | Admitting: Internal Medicine

## 2015-08-24 DIAGNOSIS — L723 Sebaceous cyst: Secondary | ICD-10-CM | POA: Diagnosis not present

## 2015-08-24 DIAGNOSIS — L089 Local infection of the skin and subcutaneous tissue, unspecified: Secondary | ICD-10-CM | POA: Diagnosis not present

## 2015-08-25 DIAGNOSIS — R9721 Rising PSA following treatment for malignant neoplasm of prostate: Secondary | ICD-10-CM | POA: Diagnosis not present

## 2015-08-25 DIAGNOSIS — N5201 Erectile dysfunction due to arterial insufficiency: Secondary | ICD-10-CM | POA: Diagnosis not present

## 2015-09-02 DIAGNOSIS — D239 Other benign neoplasm of skin, unspecified: Secondary | ICD-10-CM | POA: Diagnosis not present

## 2015-09-02 DIAGNOSIS — L821 Other seborrheic keratosis: Secondary | ICD-10-CM | POA: Diagnosis not present

## 2015-09-02 DIAGNOSIS — L723 Sebaceous cyst: Secondary | ICD-10-CM | POA: Diagnosis not present

## 2015-09-05 ENCOUNTER — Encounter: Payer: Self-pay | Admitting: Emergency Medicine

## 2015-09-15 DIAGNOSIS — H903 Sensorineural hearing loss, bilateral: Secondary | ICD-10-CM | POA: Diagnosis not present

## 2015-09-20 ENCOUNTER — Encounter: Payer: Self-pay | Admitting: Emergency Medicine

## 2015-10-06 DIAGNOSIS — H903 Sensorineural hearing loss, bilateral: Secondary | ICD-10-CM | POA: Diagnosis not present

## 2015-11-27 ENCOUNTER — Telehealth: Payer: Self-pay | Admitting: Emergency Medicine

## 2015-11-27 NOTE — Telephone Encounter (Signed)
That is fine 

## 2015-11-27 NOTE — Telephone Encounter (Signed)
Pt called in to be established w/ Dr. Lorelei Pont referred by Dr [name]. Pt says that he has a few acute concerns and would like to know if it's possible to come in sooner than available?     Please advise is it okay to put 2 slots together to schedule pt sooner?

## 2015-12-01 NOTE — Telephone Encounter (Signed)
Left vm for pt to call our office to schedule visits.

## 2015-12-02 ENCOUNTER — Encounter: Payer: Self-pay | Admitting: Family Medicine

## 2015-12-02 NOTE — Telephone Encounter (Signed)
Patient is scheduled for 12/09/2015 with Dr. Lorelei Pont.

## 2015-12-09 ENCOUNTER — Encounter: Payer: Self-pay | Admitting: Family Medicine

## 2015-12-09 ENCOUNTER — Ambulatory Visit (INDEPENDENT_AMBULATORY_CARE_PROVIDER_SITE_OTHER): Payer: Medicare Other | Admitting: Family Medicine

## 2015-12-09 VITALS — BP 132/82 | HR 89 | Temp 98.2°F | Ht 74.0 in | Wt 209.4 lb

## 2015-12-09 DIAGNOSIS — Z23 Encounter for immunization: Secondary | ICD-10-CM | POA: Diagnosis not present

## 2015-12-09 DIAGNOSIS — R972 Elevated prostate specific antigen [PSA]: Secondary | ICD-10-CM | POA: Diagnosis not present

## 2015-12-09 DIAGNOSIS — H539 Unspecified visual disturbance: Secondary | ICD-10-CM | POA: Diagnosis not present

## 2015-12-09 DIAGNOSIS — R17 Unspecified jaundice: Secondary | ICD-10-CM

## 2015-12-09 NOTE — Progress Notes (Signed)
[place] [place] at [facility] [address] [place], [place] [address] [phone]  Date:  12/09/2015   Name:  Kathleen Prescott   DOB:  12-19-1945   MRN:  [id number]  PCP:  Lamar Blinks, MD    Chief Complaint: Establish Care (Pt here to est care. Former pt of Dr. [name]. Would like flu and pneumonia vaccines. Will need recheck on PSA and bilirubin labs today. c/o of occ seeing golden/yelllow spots )   History of Present Illness:  Marquist Dumaine is a 70 y.o. very pleasant male patient who presents with the following:  Here today as a new patient- partial HPI from last visit with Dr. [name] in May  Dois Billing is a 70 y.o. male with a past hx of tongue cancer who reports to [facility] today reporting for a full physical. His girlfriend has esophogeal cancer and a past hx of esophagectomy on year ago, and since the procedure she has been nauseous. He has not been thinking about his own health much due to his girlfriends health problems. He has been feeling somewhat emotional due to his girlfriends health issues. He has gained some weight due to reduced exercise since he has been caring for his girlfriend. He would like to go back to the ear doctor he saw previously because he is noting some hearing loss. He had a recent visit with his HENT oncologist, and everything was normal. He sees that doctor every six months.   He is generally feeling well.   He notes that last week he had a couple of instances where he had to yell over some noise while trying to talk to people in a noise club.  This seemed to trigger a feeling like he might pass out- felt lightheaded and saw yellow spots in front of his eyes/.   This went away in a few moments when he sat down and put his head between his knees.  He did not have any other neurological sx such as slurred speech, numbness or weakness at that time He did take a 5 mile hike last week without any trouble so he does not  suspect a heart problem He also had his eyes examined within the last year   He has a history of tongue cancer but is now cancer free- he is followed at [facility]- dentistry clinic now for   His most recent PSA was elevated- increased velocity- at last check.  He would like to repeat this today. He was seen at [facility] urology but felt like he was being pushed towards a bx- he would prefer to seek a second opinion prior to doing anything further  We will check his PSA today He also had mild elevation of his bilirubin at his last labs- had a CT abd/ pelvis that was normal  He has not had elevated bili in the past.  Will repeat for him today   BP Readings from Last 3 Encounters:  12/09/15 138/67  06/18/15 130/84  04/09/15 124/77    Lab Results  Component Value Date   PSA 2.68 06/18/2015   PSA 1.11 04/15/2014   PSA 1.09 04/09/2013    Patient Active Problem List   Diagnosis Date Noted  . Vitamin B12 deficiency anemia 06/09/2014  . Chest pain 03/13/2013  . Depression with anxiety 08/26/2011  . Overweight (BMI 25.0-29.9) 08/26/2011  . Joint pain 08/26/2011  . 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 Oral cancer ([place]) 07/09/2010  Past Medical History:  Diagnosis Date  . ADD (attention deficit disorder) without hyperactivity   . Allergy   . Anxiety   . Arthralgia    many joints, managed with vitamins/herbs and ibuprofen  . Chest pain   . Colitis 1986   single attack (believes was ulcerative)  . Depression   . Eczema   . Esophageal stricture   . GERD (gastroesophageal reflux disease)   . Hemorrhoids   . Lichen planus   . Neuromuscular disorder ([place])    POLIO AGE 70  . 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 Pleurisy 1984  . Polio age 33  . Umbilical hernia     Past Surgical History:  Procedure Laterality Date  . COLONOSCOPY    . LEG SURGERY  when in 6th grade   L leg, bone spur removal (just above knee, laterally)  . TONGUE SURGERY     for  tongue cancer    Social History  Substance Use Topics  . Smoking status: Former Smoker    Types: Pipe, Cigarettes    Quit date: 01/25/1976  . Smokeless tobacco: Never Used     Comment: Quit smoking 35 years ago  . Alcohol use 0.0 oz/week     Comment: socially 1-2 times per month    Family History  Problem Relation Age of Onset  . Hypertension Mother   . Diabetes Mother   . Hyperlipidemia Mother   . Hypertension Father   . Benign prostatic hyperplasia Father   . Heart disease Father   . Lichen planus Sister   . Cancer Sister     ?spot on chest?  . Diabetes Maternal Grandfather     Allergies  Allergen Reactions  . Penicillins     REACTION: ?  . Sulfa Antibiotics Other (See Comments)    unknown    Medication list has been reviewed and updated.  Current Outpatient Prescriptions on File Prior to Visit  Medication Sig Dispense Refill  . b complex vitamins tablet Take 0.5 tablets by mouth daily.      . Biotin 5000 MCG CAPS Take 1 capsule by mouth 2 (two) times daily.    . Calcium-Magnesium-Zinc 167-83-8 MG TABS Take by mouth. 1/2 tablet daily    . chlorpheniramine (CHLOR-TRIMETON) 4 MG tablet Take 2 mg by mouth every 6 (six) hours as needed.     . Cholecalciferol (VITAMIN D3) 2000 UNITS capsule Take 2,000 Units by mouth daily.    . fluticasone (FLONASE) 50 MCG/ACT nasal spray PLACE 2 SPRAYS INTO BOTH NOSTRILS DAILY 16 g 11  . glucosamine-chondroitin 500-400 MG tablet Take 1 tablet by mouth 2 (two) times daily.      . meloxicam (MOBIC) 15 MG tablet 15 mg as needed.     . montelukast (SINGULAIR) 10 MG tablet Take 1 tablet (10 mg total) by mouth at bedtime. 30 tablet 11  . Multiple Vitamins-Minerals (MULTIVITAMIN WITH MINERALS) tablet Take 1 tablet by mouth daily.      . NON FORMULARY ALJ -herbal decongestant  Takes as needed    . omeprazole (PRILOSEC) 40 MG capsule TAKE 1 CAPSULE BY MOUTH EVERY DAY, TAKE 30 MINUTES BEFORE A MEAL 30 capsule 3  . sildenafil (REVATIO) 20 MG  tablet Take 2-3 tablets one hour prior to intercourse 50 tablet 5  . sildenafil (VIAGRA) 100 MG tablet Take 0.5-1 tablets (50-100 mg total) by mouth daily as needed for erectile dysfunction. 5 tablet 5  . valACYclovir (VALTREX) 1000 MG  tablet Take 1 tablet twice a day for 3 days 30 tablet 2  . Zn-Pyg Afri-Nettle-Saw Palmet (SAW PALMETTO COMPLEX PO) Take 1 tablet by mouth 2 (two) times daily.     No current facility-administered medications on file prior to visit.     Review of Systems:  As per HPI- otherwise negative.   Physical Examination: Vitals:   12/09/15 1532  BP: 138/67  Pulse: 89  Temp: 98.2 F (36.8 C)   Vitals:   12/09/15 1532  Weight: 209 lb 6.4 oz (95 kg)  Height: 6\' 2"  (1.88 m)   Body mass index is 26.89 kg/m. Ideal Body Weight: Weight in (lb) to have BMI = 25: 194.3  GEN: WDWN, NAD, Non-toxic, A & O x 3, looks well HEENT: Atraumatic, Normocephalic. Neck supple. No masses, No LAD.  Bilateral TM wnl, oropharynx normal.  PEERL,EOMI.   Ears and Nose: No external deformity. CV: RRR, No M/G/R. No JVD. No thrill. No extra heart sounds. PULM: CTA B, no wheezes, crackles, rhonchi. No retractions. No resp. distress. No accessory muscle use. ABD: S, NT, ND, +BS. No rebound. No HSM. EXTR: No c/c/e NEURO Normal gait.  PSYCH: Normally interactive. Conversant. Not depressed or anxious appearing.  Calm demeanor.    Assessment and Plan: Increased prostate specific antigen (PSA) velocity - Plan: PSA, Ambulatory referral to Urology  Elevated bilirubin - Plan: Hepatic function panel  Immunization due - Plan: Pneumococcal polysaccharide vaccine 23-valent greater than or equal to 2yo subcutaneous/IM  Encounter for immunization - Plan: Flu vaccine HIGH DOSE PF  Vision changes  He is due for a flu shot and pneumovax today- will give both He would like to see a [facility]rologist at [facility]- will repeat PSA and did referral today Repeat LFTS today He relates what sound like  pre-syncopal episodes while trying to shout over loud music within the last couple of weeks.  For the time being he prefers to see if this continues as opposed to starting a neurological or cardiac w/u which I think is reasonable.  He will let me know if these or other symptoms continue to trouble him  Will plan further follow- up pending labs.    Signed Lamar Blinks, MD

## 2015-12-09 NOTE — Progress Notes (Signed)
Pre visit review using our clinic review tool, if applicable. No additional management support is needed unless otherwise documented below in the visit note. 

## 2015-12-09 NOTE — Patient Instructions (Signed)
We will check on your PSA and bilirubin today- I'll be in touch with these results asap I have asked referrals to place a referral with Dr. Francesca Jewett at [facility] urology for you; if you would like to give them a call in a few days you can schedule an appointment at your convenience.   [address]  [place], [place] [address]   Phone:  [phone]  You got your flu shot and 2nd pneumonia (pneumovax 23) today; you are all done with pneumonia vaccines!   Continue to stay active and exercise; weight lifting can help you to maintain your muscle mass and metabolism with age.    It was nice to see you today- let's plan to meet again in the spring- your physical is due in May 2018

## 2015-12-10 DIAGNOSIS — K1379 Other lesions of oral mucosa: Secondary | ICD-10-CM | POA: Diagnosis not present

## 2015-12-10 LAB — HEPATIC FUNCTION PANEL
ALBUMIN: 4.5 g/dL (ref 3.5–5.2)
ALT: 23 U/L (ref 0–53)
AST: 27 U/L (ref 0–37)
Alkaline Phosphatase: 54 U/L (ref 39–117)
Bilirubin, Direct: 0.2 mg/dL (ref 0.0–0.3)
Total Bilirubin: 0.9 mg/dL (ref 0.2–1.2)
Total Protein: 6.9 g/dL (ref 6.0–8.3)

## 2015-12-10 LAB — PSA: PSA: 1.26 ng/mL (ref 0.10–4.00)

## 2015-12-21 DIAGNOSIS — H00022 Hordeolum internum right lower eyelid: Secondary | ICD-10-CM | POA: Diagnosis not present

## 2015-12-24 ENCOUNTER — Other Ambulatory Visit: Payer: Self-pay | Admitting: Internal Medicine

## 2015-12-25 ENCOUNTER — Other Ambulatory Visit: Payer: Self-pay | Admitting: Internal Medicine

## 2015-12-25 MED ORDER — OMEPRAZOLE 40 MG PO CPDR: DELAYED_RELEASE_CAPSULE | ORAL | 1 refills | Status: DC

## 2015-12-25 NOTE — Telephone Encounter (Signed)
Rx sent 

## 2016-01-07 ENCOUNTER — Ambulatory Visit (INDEPENDENT_AMBULATORY_CARE_PROVIDER_SITE_OTHER): Payer: Medicare Other | Admitting: Urgent Care

## 2016-01-07 VITALS — BP 140/92 | HR 75 | Temp 98.3°F | Resp 17 | Ht 74.0 in | Wt 210.0 lb

## 2016-01-07 DIAGNOSIS — R21 Rash and other nonspecific skin eruption: Secondary | ICD-10-CM

## 2016-01-07 DIAGNOSIS — K121 Other forms of stomatitis: Secondary | ICD-10-CM

## 2016-01-07 MED ORDER — HYDROCORTISONE 1 % EX CREA
1.0000 | TOPICAL_CREAM | Freq: Two times a day (BID) | CUTANEOUS | 0 refills | Status: DC
Start: 2016-01-07 — End: 2017-09-20

## 2016-01-07 MED ORDER — KETOCONAZOLE 2 % EX CREA: 1.0000 "application " | TOPICAL_CREAM | Freq: Every day | CUTANEOUS | 0 refills | Status: DC

## 2016-01-07 NOTE — Progress Notes (Signed)
    MRN: [id number] DOB: 1945/11/13  Subjective:   Jonathan Garrison is a 70 y.o. male presenting for chief complaint of Rash (On buttocks. x1week. Has tried antifungal and zinc. )  Reports 1 week history of pruritic rash that has started stinging. He has tried using clotrimazole with a steroid cream without any relief. States that he is using a blow drier over the area. Denies fever, penile discharge, dysuria, hematuria, urinary frequency. He has a history of external hemorrhoids which he is also concerned about. He would like [place] to examine his tongue as well since he has a history of tongue cancer, s/p multiple tongue surgeries. He does have a f/u appointment scheduled.  Jonathan Garrison has a current medication list which includes the following prescription(s): aspirin, b complex vitamins, biotin, calcium-magnesium-zinc, chlorpheniramine, vitamin d3, clotrimazole, fluticasone, glucosamine-chondroitin, meloxicam, montelukast, multivitamin with minerals, NON FORMULARY, omeprazole, sildenafil, sildenafil, triamcinolone lotion, valacyclovir, zinc oxide, and zn-pyg afri-nettle-saw palmet. Also is allergic to penicillins and sulfa antibiotics.  Jonathan Garrison  has a past medical history of ADD (attention deficit disorder) without hyperactivity; Allergy; Anxiety; Arthralgia; Chest pain; Colitis (1986); Depression; Eczema; Esophageal stricture; GERD (gastroesophageal reflux disease); Hemorrhoids; Lichen planus; Neuromuscular disorder ([place]); Oral cancer ([place]) (05/2010); Pleurisy (1984); Polio (age 59); and Umbilical hernia. Also  has a past surgical history that includes Leg Surgery (when in 6th grade); Tongue surgery; and Colonoscopy.   Objective:   Vitals: BP (!) 140/92 (BP Location: Left Arm, Patient Position: Sitting, Cuff Size: Normal)   Pulse 75   Temp 98.3 F (36.8 C) (Oral)   Resp 17   Ht 6\' 2"  (1.88 m)   Wt 210 lb (95.3 kg)   SpO2 97%   BMI 26.96 kg/m   Physical Exam  Constitutional: He is oriented to  person, place, and time. He appears well-developed and well-nourished.  HENT:  Left tongue with well healed scar tissue s/p cancer surgery.  Cardiovascular: Normal rate.   Pulmonary/Chest: Effort normal.  Genitourinary:     Neurological: He is alert and oriented to person, place, and time.   Skin KOH was negative.  Assessment and Plan :   This case was precepted with Dr. [name].   1. Rash and nonspecific skin eruption - Start ketoconazole cream for 2 weeks with hydrocortisone 1%. RTC if rash worsens or does not resolve.  2. Mouth ulcers - Follow up with oncology, patient has a visit coming up for recheck.  Jaynee Eagles, PA-C [facility] [phone] 01/07/2016 5:12 PM

## 2016-01-07 NOTE — Patient Instructions (Addendum)
Apply ketoconazole cream 2% twice daily for the next 2 weeks. RTC if your rash worsens significantly.    IF you received an x-ray today, you will receive an invoice from [facility]. Please contact [facility] at [phone] with questions or concerns regarding your invoice.   IF you received labwork today, you will receive an invoice from [facility]. Please contact [facility] at [phone] with questions or concerns regarding your invoice.   Our billing staff will not be able to assist you with questions regarding bills from these companies.  You will be contacted with the lab results as soon as they are available. The fastest way to get your results is to activate your My Chart account. Instructions are located on the last page of this paperwork. If you have not heard from [place] regarding the results in 2 weeks, please contact this office.

## 2016-02-09 ENCOUNTER — Ambulatory Visit (INDEPENDENT_AMBULATORY_CARE_PROVIDER_SITE_OTHER): Payer: Medicare Other | Admitting: Internal Medicine

## 2016-02-09 ENCOUNTER — Encounter: Payer: Self-pay | Admitting: Internal Medicine

## 2016-02-09 VITALS — BP 124/70 | HR 80 | Ht 74.0 in | Wt 211.0 lb

## 2016-02-09 DIAGNOSIS — K219 Gastro-esophageal reflux disease without esophagitis: Secondary | ICD-10-CM

## 2016-02-09 DIAGNOSIS — K222 Esophageal obstruction: Secondary | ICD-10-CM

## 2016-02-09 DIAGNOSIS — K429 Umbilical hernia without obstruction or gangrene: Secondary | ICD-10-CM | POA: Diagnosis not present

## 2016-02-09 MED ORDER — OMEPRAZOLE 40 MG PO CPDR: DELAYED_RELEASE_CAPSULE | ORAL | 11 refills | Status: DC

## 2016-02-09 NOTE — Patient Instructions (Signed)
Please follow up in one year 

## 2016-02-11 ENCOUNTER — Encounter: Payer: Self-pay | Admitting: Internal Medicine

## 2016-02-11 NOTE — Progress Notes (Signed)
HISTORY OF PRESENT ILLNESS:  Jonathan Garrison is a 71 y.o. 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 He was last evaluated 08/04/2014. See that dictation for details. He presents today for routine follow-up of his GERD as well as a request to evaluate his periumbilical region and recommendations for possible symptomatic hemorrhoids. First, the patient reports being compliant with his daily omeprazole 40 mg. He denies reflux symptoms. No recurrent dysphagia except for one occasion with a large pill. Next, the reported problems with some transient rectal irritation and minor bleeding. Previous colonoscopy in 2012 negative. Finally, he requests that evaluation of small periumbilical defect which causes trivial soreness times. Of note. CT scan of the abdomen and pelvis with contrast 07/22/2015 was unremarkable. Blood work from May 2017 was unremarkable. Previous testing for celiac disease was negative.  REVIEW OF SYSTEMS:  All non-GI ROS negative except for allergies, anxiety, arthralgia  Past Medical History:  Diagnosis Date  . ADD (attention deficit disorder) without hyperactivity   . Allergy   . Anxiety   . Arthralgia    many joints, managed with vitamins/herbs and ibuprofen  . Chest pain   . Colitis 1986   single attack (believes was ulcerative)  . Depression   . Eczema   . Esophageal stricture   . GERD (gastroesophageal reflux disease)   . Hemorrhoids   . Lichen planus   . Neuromuscular disorder ([place])    POLIO AGE 39  . 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 Pleurisy 1984  . Polio age 35  . Umbilical hernia     Past Surgical History:  Procedure Laterality Date  . COLONOSCOPY    . LEG SURGERY  when in 6th grade   L leg, bone spur removal (just above knee, laterally)  . TONGUE SURGERY     for tongue cancer    Social  History Jonathan Garrison  reports that he quit smoking about 40 years ago. His smoking use included Pipe and Cigarettes. He has never used smokeless tobacco. He reports that he drinks about 4.2 - 8.4 oz of alcohol per week . He reports that he does not use drugs.  family history includes Benign prostatic hyperplasia in his father; Cancer in his sister; Diabetes in his maternal grandfather and mother; Heart disease in his father; Hyperlipidemia in his mother; Hypertension in his father and mother; Lichen planus in his sister.  Allergies  Allergen Reactions  . Penicillins     REACTION: ?  . Sulfa Antibiotics Other (See Comments)    unknown       PHYSICAL EXAMINATION: Vital signs: BP 124/70   Pulse 80   Ht 6\' 2"  (1.88 m)   Wt 211 lb (95.7 kg)   BMI 27.09 kg/m   Constitutional: generally well-appearing, no acute distress Psychiatric: alert and oriented x3, cooperative Eyes: extraocular movements intact, anicteric, conjunctiva pink Mouth: oral pharynx moist, no lesions Neck: supple no lymphadenopathy Cardiovascular: heart regular rate and rhythm, no murmur Lungs: clear to auscultation bilaterally Abdomen: soft, nontender, nondistended, no obvious ascites, no peritoneal signs, normal bowel sounds, no organomegaly. Slight defect in the superior portion of umbilicus without true hernia Rectal: Omitted Extremities: no clubbing cyanosis or lower extremity edema bilaterally Skin: no lesions on visible extremities Neuro: No focal deficits. No asterixis.   ASSESSMENT: #1. GERD complicated by severe erosive esophagitis and peptic stricture. Asymptomatic post dilation July  2015 on chronic PPI #2. Slight periumbilical defect without true hernia #3. Transient rectal discomfort. Resolved #4. Index colonoscopy 2012 negative   PLAN:  #1. Reflux precautions #2. Continue daily PPI #3. Re-present for recurrent dysphagia. Advised #4. Reassured regarding Jonathan Garrison umbilicus region #5. Recommended  daily fiber supplementation with Metamucil #6. Routine GI follow-up one year #7. Repeat screening colonoscopy 2022

## 2016-03-05 IMAGING — CR DG HIP (WITH OR WITHOUT PELVIS) 2-3V*L*
3 series · 3 of 3 positions shown · non-contrast
Comparison: None.

CLINICAL DATA: Left hip pain.

EXAM:
LEFT HIP (WITH PELVIS) 2-3 VIEWS

[AP]
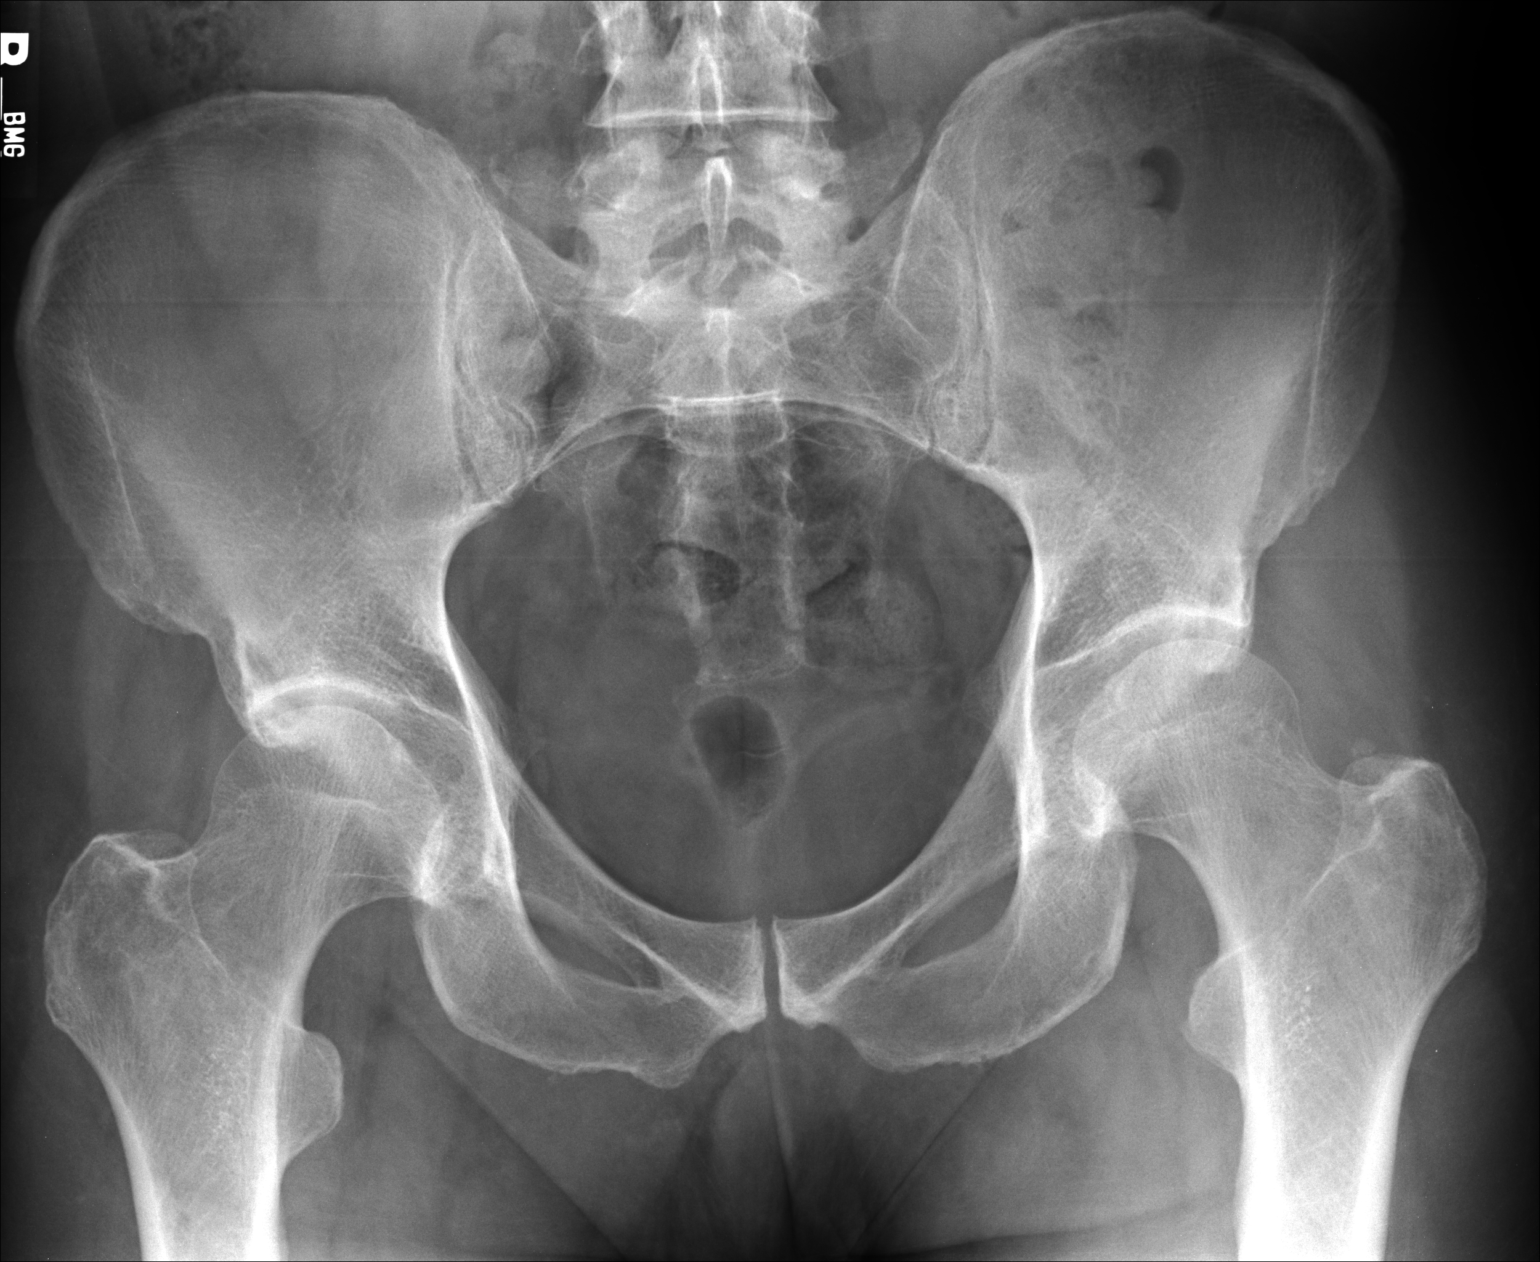

[lateral (1 of 2)]
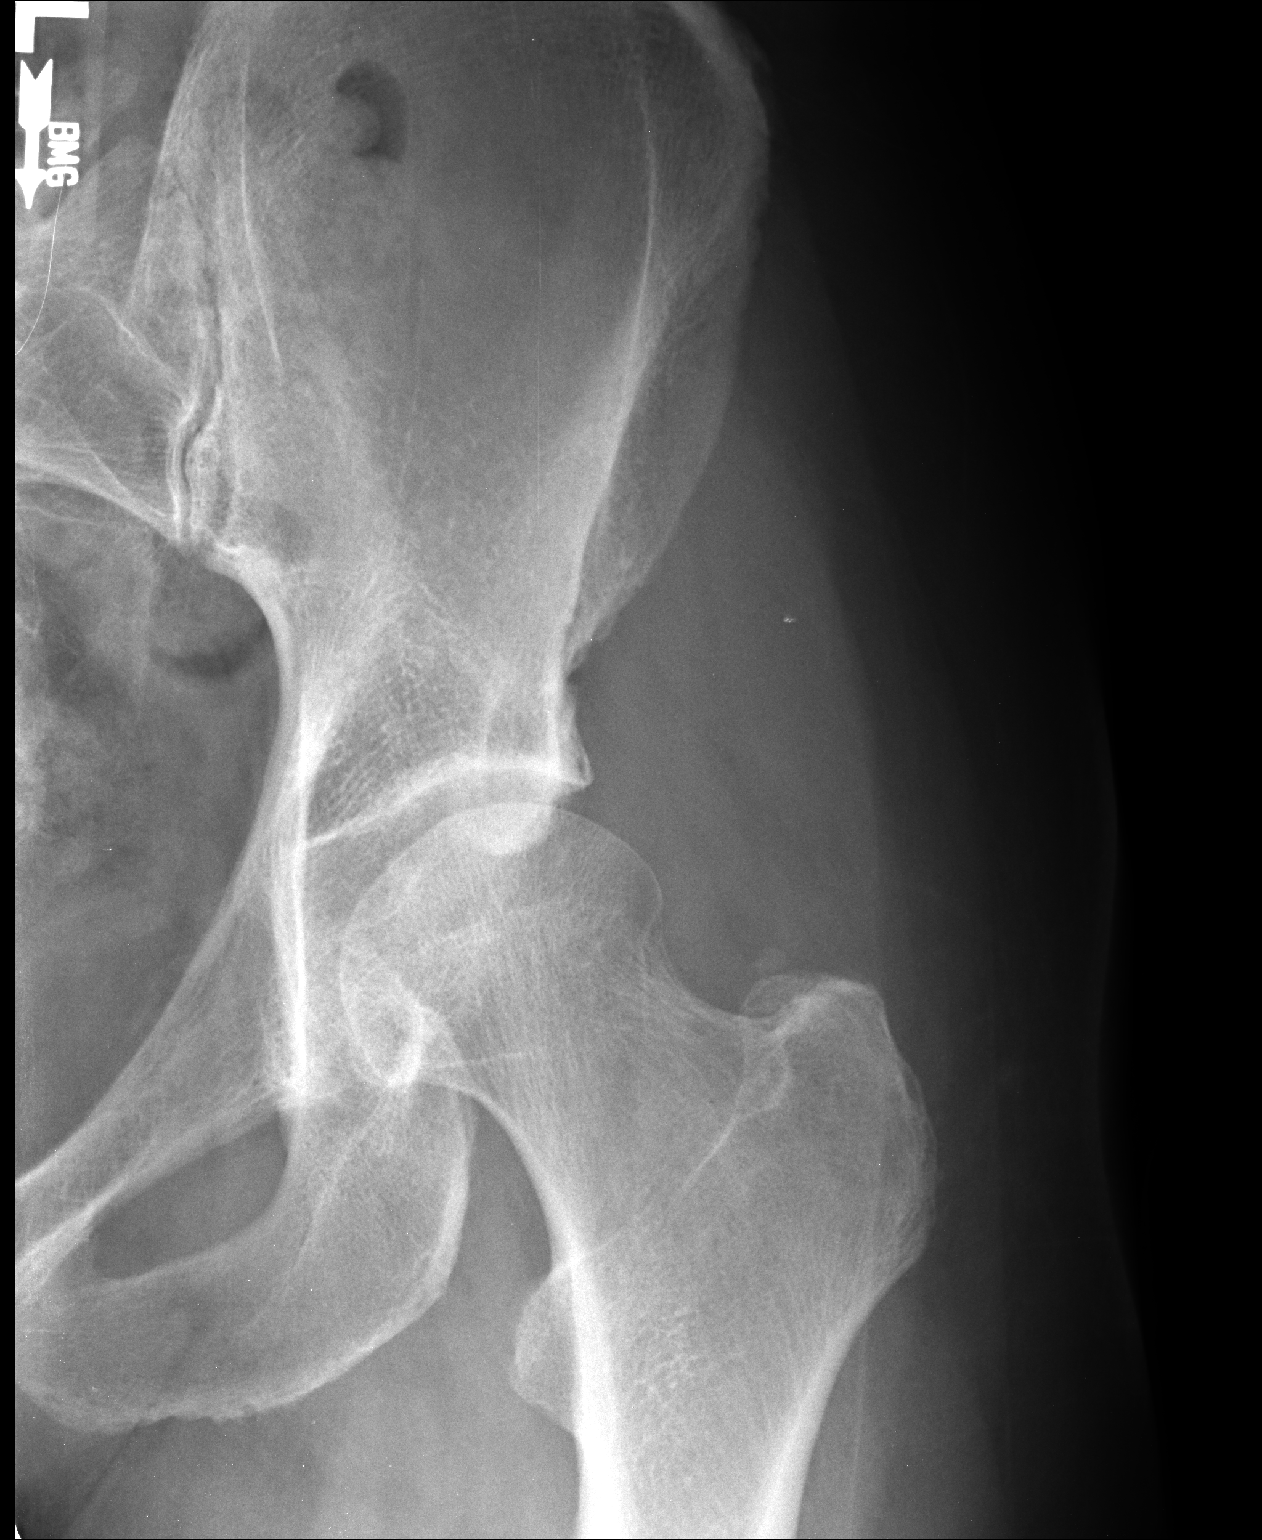

[lateral (2 of 2)]
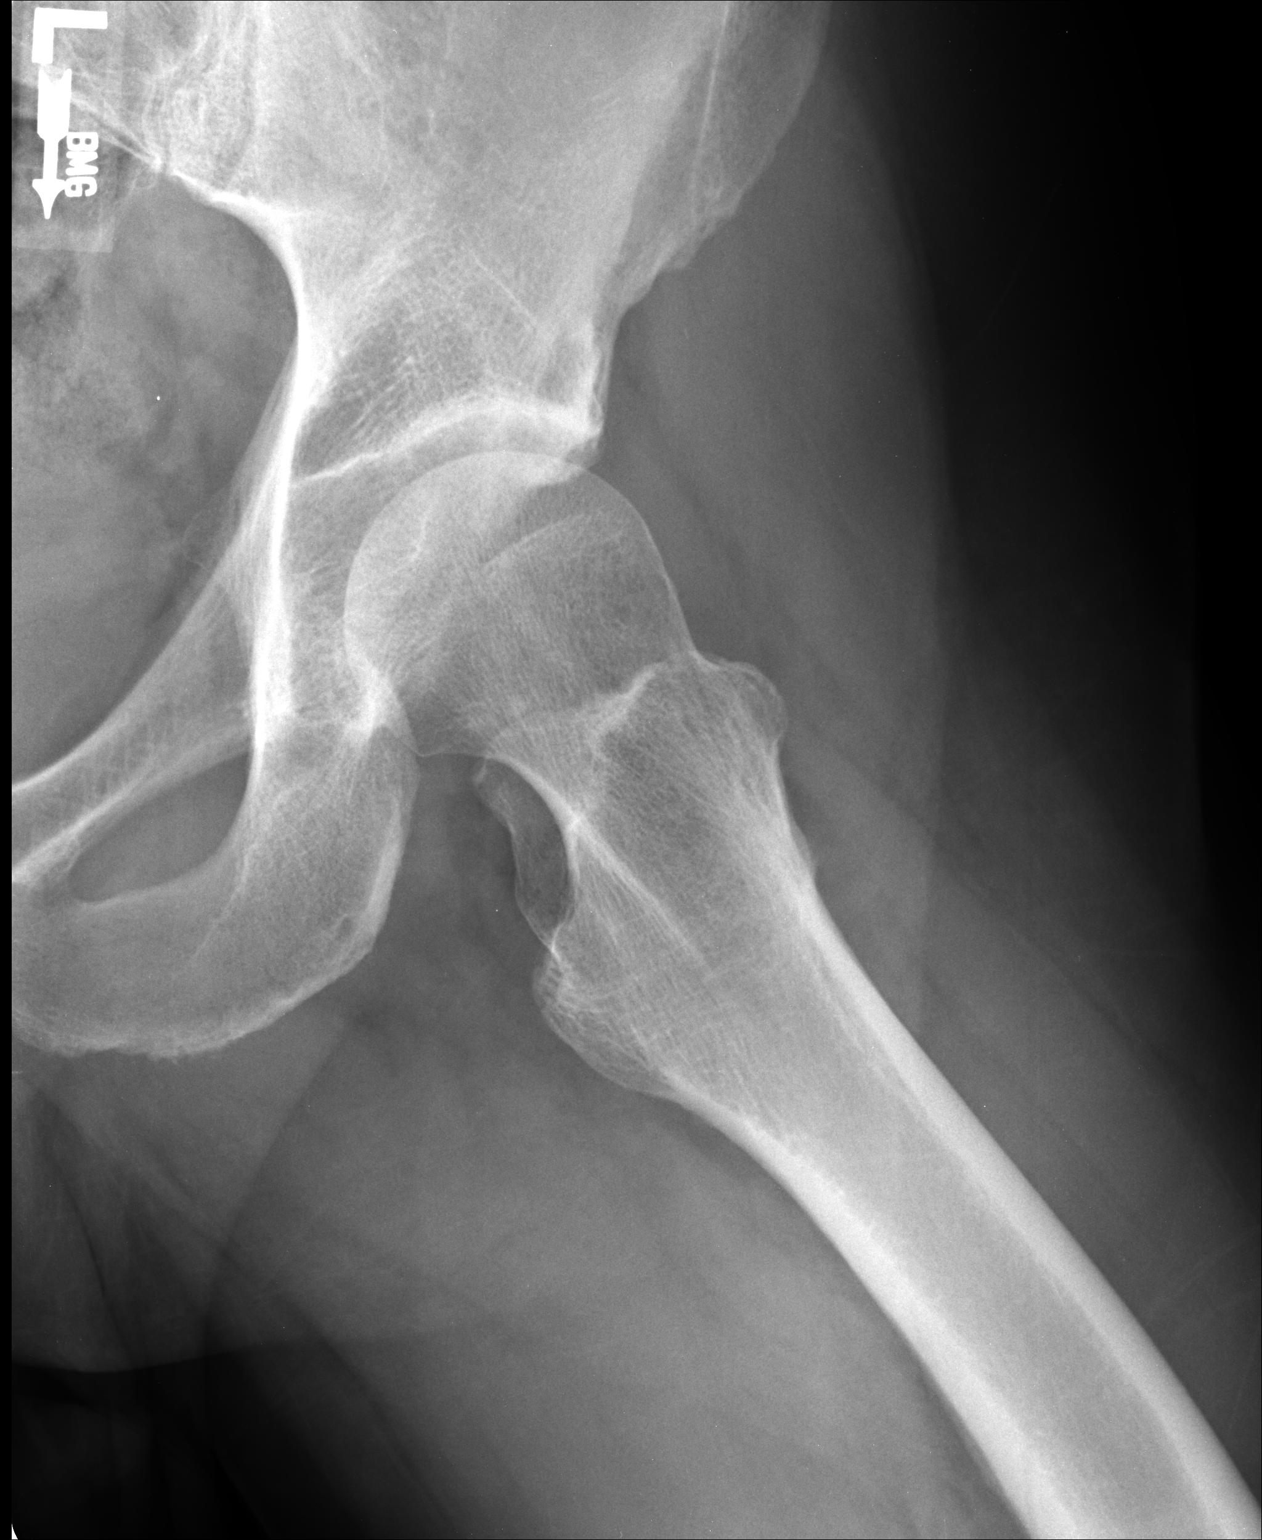

[3 of 3 positions shown; findings below may reference images not displayed]

FINDINGS: Early spurring in the hips bilaterally. Joint spaces are maintained.
Femoral heads are unremarkable. No fracture, subluxation or
dislocation. SI joints are symmetric and unremarkable.
IMPRESSION: Early degenerative spurring.  No acute bony abnormality.

## 2016-03-16 DIAGNOSIS — K1230 Oral mucositis (ulcerative), unspecified: Secondary | ICD-10-CM | POA: Diagnosis not present

## 2016-03-17 DIAGNOSIS — K1230 Oral mucositis (ulcerative), unspecified: Secondary | ICD-10-CM | POA: Diagnosis not present

## 2016-03-23 DIAGNOSIS — L219 Seborrheic dermatitis, unspecified: Secondary | ICD-10-CM | POA: Diagnosis not present

## 2016-03-23 DIAGNOSIS — L570 Actinic keratosis: Secondary | ICD-10-CM | POA: Diagnosis not present

## 2016-03-23 DIAGNOSIS — L821 Other seborrheic keratosis: Secondary | ICD-10-CM | POA: Diagnosis not present

## 2016-03-23 DIAGNOSIS — L080 Pyoderma: Secondary | ICD-10-CM | POA: Diagnosis not present

## 2016-03-23 DIAGNOSIS — D492 Neoplasm of unspecified behavior of bone, soft tissue, and skin: Secondary | ICD-10-CM | POA: Diagnosis not present

## 2016-06-24 ENCOUNTER — Telehealth: Payer: Self-pay | Admitting: Family Medicine

## 2016-06-24 NOTE — Telephone Encounter (Signed)
Relation to HU:DJSH Call back number:[phone]   Reason for call:  Patient last seen 12/09/15 as per AVS: It was nice to see you today- let's plan to meet again in the spring- your physical is due in May 2018.   Patient called to confirm appointment today and I explained to patient due to he's insurance he's entitled to a medicare wellness appointment only due to medicare being he's primary insurance. Patient was admant about having only he's "physical" with PCP and declined "medicare wellness" appointment, please advise or contact patient directly to add more insight.

## 2016-06-27 NOTE — Telephone Encounter (Signed)
Called patient and left message to return call

## 2016-06-29 ENCOUNTER — Encounter: Payer: Self-pay | Admitting: Family Medicine

## 2016-06-29 ENCOUNTER — Ambulatory Visit (INDEPENDENT_AMBULATORY_CARE_PROVIDER_SITE_OTHER): Payer: Medicare Other | Admitting: Family Medicine

## 2016-06-29 VITALS — BP 152/84 | HR 104 | Temp 99.9°F | Ht 73.25 in | Wt 204.8 lb

## 2016-06-29 DIAGNOSIS — Z9109 Other allergy status, other than to drugs and biological substances: Secondary | ICD-10-CM | POA: Diagnosis not present

## 2016-06-29 DIAGNOSIS — R972 Elevated prostate specific antigen [PSA]: Secondary | ICD-10-CM | POA: Diagnosis not present

## 2016-06-29 DIAGNOSIS — Z8639 Personal history of other endocrine, nutritional and metabolic disease: Secondary | ICD-10-CM

## 2016-06-29 DIAGNOSIS — M542 Cervicalgia: Secondary | ICD-10-CM

## 2016-06-29 DIAGNOSIS — N529 Male erectile dysfunction, unspecified: Secondary | ICD-10-CM | POA: Diagnosis not present

## 2016-06-29 DIAGNOSIS — J40 Bronchitis, not specified as acute or chronic: Secondary | ICD-10-CM | POA: Diagnosis not present

## 2016-06-29 DIAGNOSIS — E785 Hyperlipidemia, unspecified: Secondary | ICD-10-CM

## 2016-06-29 DIAGNOSIS — Z0000 Encounter for general adult medical examination without abnormal findings: Secondary | ICD-10-CM

## 2016-06-29 DIAGNOSIS — Z131 Encounter for screening for diabetes mellitus: Secondary | ICD-10-CM

## 2016-06-29 DIAGNOSIS — Z13220 Encounter for screening for lipoid disorders: Secondary | ICD-10-CM

## 2016-06-29 MED ORDER — MELOXICAM 15 MG PO TABS: 15.0000 mg | ORAL_TABLET | Freq: Every day | ORAL | 2 refills | Status: DC

## 2016-06-29 MED ORDER — SILDENAFIL CITRATE 20 MG PO TABS: ORAL_TABLET | ORAL | 5 refills | Status: DC

## 2016-06-29 MED ORDER — DOXYCYCLINE HYCLATE 100 MG PO CAPS: 100.0000 mg | ORAL_CAPSULE | Freq: Two times a day (BID) | ORAL | 0 refills | Status: DC

## 2016-06-29 MED ORDER — FLUTICASONE PROPIONATE 50 MCG/ACT NA SUSP: NASAL | 11 refills | Status: DC

## 2016-06-29 NOTE — Patient Instructions (Signed)
It was nice to see you today!  We are going to treat you for bronchitis with doxycycline (antibiotic)- take this twice a day for 10 days. Please let me know if your fever persists or if you are not feeling much better soon Please come in for fasting labs as planned and I will be in touch with your results We will order cologuard for you- please check with your insurance company about coverage for this service and also the Shingrix (shingles vaccine- 2 shot series)

## 2016-06-29 NOTE — Progress Notes (Addendum)
[facility] [address], [place], [place] [address] [phone]  Date:  06/29/2016   Name:  Jonathan Garrison   DOB:  04-06-1945   MRN:  [id number]  PCP:  Darreld Mclean, MD    Chief Complaint: Annual Exam   History of Present Illness:  Jonathan Garrison is a 71 y.o. very pleasant male patient who presents with the following:  Here today for a CPE However he is also feeling a bit ill- thinks that he may have a respiratory infection and is noted to have a low grade fever A week ago he noted a cough and his ears felt stuffy Ears feel better.  He had a little nasal and sinus congestion but this is now resolved He has noted the cough still- the cough is not generally productive but his chest does feel congtested Temp of 99.9 is the highest his temp has been  He was in the ER at [facility] 03/2015 with a similar illness and it sounds like he was treated for pneumonia with azithromycin His GF lives in [place]; they split time between his place here and her place near [place].  He hopes that they will move in together soon He is now retired from his long tem teaching job at [employer] from Last 3 Encounters:  06/29/16 204 lb 12.8 oz (92.9 kg)  02/09/16 211 lb (95.7 kg)  01/07/16 210 lb (95.3 kg)   He will come in tomorrow for fasting labs We have been monitoring his PSA- it had gone up from his baseline last may- repeat in November was much better.   Will repeat for him today  He would like to have his T level checked with his labs tomorrow which is fine History of low iron- we will check his feritin  He had a colonoscopy in 2012- he would like to do a cologuard test.   He did have zostavax and would be interested in getting shingrix  He has medicare with [employer]  He would like to see Dr. Ron Agee at [facility] for a history of degenerative spine diease   He needs a refill of his revatio which he uses for ED  BP Readings  from Last 3 Encounters:  06/29/16 (!) 152/84  02/09/16 124/70  01/07/16 (!) 140/92      Patient Active Problem List   Diagnosis Date Noted  . Vitamin B12 deficiency anemia 06/09/2014  . Chest pain 03/13/2013  . Depression with anxiety 08/26/2011  . Overweight (BMI 25.0-29.9) 08/26/2011  . Joint pain 08/26/2011  . 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 Oral cancer ([place]) 07/09/2010    Past Medical History:  Diagnosis Date  . ADD (attention deficit disorder) without hyperactivity   . Allergy   . Anxiety   . Arthralgia    many joints, managed with vitamins/herbs and ibuprofen  . Chest pain   . Colitis 1986   single attack (believes was ulcerative)  . Depression   . Eczema   . Esophageal stricture   . GERD (gastroesophageal reflux disease)   . Hemorrhoids   . Lichen planus   . Neuromuscular disorder ([place])    POLIO AGE 55  . 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 Pleurisy 1984  . Polio age 41  . Umbilical hernia     Past Surgical History:  Procedure Laterality Date  . COLONOSCOPY    . LEG  SURGERY  when in 6th grade   L leg, bone spur removal (just above knee, laterally)  . TONGUE SURGERY     for tongue cancer    Social History  Substance Use Topics  . Smoking status: Former Smoker    Types: Pipe, Cigarettes    Quit date: 01/25/1976  . Smokeless tobacco: Never Used     Comment: Quit smoking 35 years ago  . Alcohol use 4.2 - 8.4 oz/week    7 - 14 Glasses of wine per week    Family History  Problem Relation Age of Onset  . Hypertension Mother   . Diabetes Mother   . Hyperlipidemia Mother   . Hypertension Father   . Benign prostatic hyperplasia Father   . Heart disease Father   . Lichen planus Sister   . Cancer Sister        ?spot on chest? contained  . Diabetes Maternal Grandfather   . Colon cancer Neg Hx   . Esophageal cancer Neg Hx   . Stomach cancer Neg Hx   . Pancreatic cancer Neg Hx   . Liver disease Neg Hx      Allergies  Allergen Reactions  . Penicillins     REACTION: ?  . Sulfa Antibiotics Other (See Comments)    unknown    Medication list has been reviewed and updated.  Current Outpatient Prescriptions on File Prior to Visit  Medication Sig Dispense Refill  . aspirin 81 MG tablet Take 81 mg by mouth daily.    Marland Kitchen b complex vitamins tablet Take 0.5 tablets by mouth daily.      . Biotin 5000 MCG CAPS Take 1 capsule by mouth 2 (two) times daily.    . Calcium-Magnesium-Zinc 167-83-8 MG TABS Take by mouth. 1/2 tablet daily    . chlorpheniramine (CHLOR-TRIMETON) 4 MG tablet Take 2 mg by mouth every 6 (six) hours as needed.     . Cholecalciferol (VITAMIN D3) 2000 UNITS capsule Take 2,000 Units by mouth daily.    . clotrimazole (LOTRIMIN) 1 % cream Apply 1 application topically 2 (two) times daily.    . fluticasone (FLONASE) 50 MCG/ACT nasal spray PLACE 2 SPRAYS INTO BOTH NOSTRILS DAILY 16 g 11  . glucosamine-chondroitin 500-400 MG tablet Take 1 tablet by mouth 2 (two) times daily.      . hydrocortisone cream 1 % Apply 1 application topically 2 (two) times daily. 30 g 0  . ketoconazole (NIZORAL) 2 % cream Apply 1 application topically daily. 15 g 0  . meloxicam (MOBIC) 15 MG tablet 15 mg as needed.     . Multiple Vitamins-Minerals (MULTIVITAMIN WITH MINERALS) tablet Take 1 tablet by mouth daily.      . NON FORMULARY ALJ -herbal decongestant  Takes as needed    . omeprazole (PRILOSEC) 40 MG capsule TAKE 1 CAPSULE BY MOUTH EVERY DAY, TAKE 30 MINUTES BEFORE A MEAL 30 capsule 11  . sildenafil (REVATIO) 20 MG tablet Take 2-3 tablets one hour prior to intercourse 50 tablet 5  . sildenafil (VIAGRA) 100 MG tablet Take 0.5-1 tablets (50-100 mg total) by mouth daily as needed for erectile dysfunction. 5 tablet 5  . triamcinolone lotion (KENALOG) 0.1 % Apply 1 application topically 3 (three) times daily.    Marland Kitchen zinc oxide (BALMEX) 11.3 % CREA cream Apply 1 application topically 2 (two) times daily.    Marland Kitchen  Zn-Pyg Afri-Nettle-Saw Palmet (SAW PALMETTO COMPLEX PO) Take 1 tablet by mouth 2 (two) times daily.  No current facility-administered medications on file prior to visit.     Review of Systems:  As per HPI- otherwise negative. No CP or SOB with exercise No rash    Physical Examination: Vitals:   06/29/16 1320  BP: (!) 152/84  Pulse: (!) 104  Temp: 99.9 F (37.7 C)   Vitals:   06/29/16 1320  Weight: 204 lb 12.8 oz (92.9 kg)  Height: 6' 1.25" (1.861 m)   Body mass index is 26.84 kg/m. Ideal Body Weight: Weight in (lb) to have BMI = 25: 190.4  GEN: WDWN, NAD, Non-toxic, A & O x 3, looks well, minimal overweight HEENT: Atraumatic, Normocephalic. Neck supple. No masses, No LAD.  Bilateral TM wnl, oropharynx normal.  PEERL,EOMI.   Ears and Nose: No external deformity.   CV: RRR, No M/G/R. No JVD. No thrill. No extra heart sounds. PULM: no wheezes or crackles, but he does have ronchi in bases. No retractions. No resp. distress. No accessory muscle use. ABD: S, NT, ND, +BS. No rebound. No HSM.  Tiny umbilical hernia EXTR: No c/c/e NEURO Normal gait.  PSYCH: Normally interactive. Conversant. Not depressed or anxious appearing.  Calm demeanor.  No external hemorrhoids, normal DRE   Assessment and Plan: Physical exam  Bronchitis - Plan: doxycycline (VIBRAMYCIN) 100 MG capsule  Increased prostate specific antigen (PSA) velocity - Plan: Testosterone Total,Free,Bio, Males, PSA  Screening for diabetes mellitus - Plan: Comprehensive metabolic panel  Screening for hyperlipidemia - Plan: Lipid panel  History of iron deficiency - Plan: Ferritin, CBC  Dyslipidemia - Plan: Lipid panel  Environmental allergies - Plan: fluticasone (FLONASE) 50 MCG/ACT nasal spray  Neck pain - Plan: meloxicam (MOBIC) 15 MG tablet, Ambulatory referral to Orthopedic Surgery  Erectile dysfunction, unspecified erectile dysfunction type - Plan: sildenafil (REVATIO) 20 MG tablet  Here today for a  CPE Discussed screening labs- ordered cologuard for him Will plan further follow- up pending labs. He is also ill today- will treat for bronchitis with doxycycline Asked him to let me know if not improving soon BP is a bit high today- it is normally ok.  Will continue to monitor  BP Readings from Last 3 Encounters:  06/29/16 (!) 152/84  02/09/16 124/70  01/07/16 (!) 140/92   Signed Lamar Blinks, MD   Received his labs 6/10- will send message to pt    Chemistry      Component Value Date/Time   NA 140 06/30/2016 0904   K 4.7 06/30/2016 0904   CL 105 06/30/2016 0904   CO2 29 06/30/2016 0904   BUN 26 (H) 06/30/2016 0904   CREATININE 0.89 06/30/2016 0904   CREATININE 0.87 06/18/2015 1018      Component Value Date/Time   CALCIUM 9.6 06/30/2016 0904   ALKPHOS 47 06/30/2016 0904   AST 24 06/30/2016 0904   ALT 19 06/30/2016 0904   BILITOT 1.5 (H) 06/30/2016 0904     Lipids:    Component Value Date/Time   CHOL 156 06/30/2016 0904   TRIG 36.0 06/30/2016 0904   HDL 72.20 06/30/2016 0904   VLDL 7.2 06/30/2016 0904   CHOLHDL 2 06/30/2016 0904   Lab Results  Component Value Date   WBC 10.0 06/30/2016   HGB 13.5 06/30/2016   HCT 40.2 06/30/2016   MCV 91.4 06/30/2016   PLT 241.0 06/30/2016    Lab Results  Component Value Date   PSA 1.06 06/30/2016   PSA 1.26 12/09/2015   PSA 2.68 06/18/2015   PSA looks fine

## 2016-06-30 ENCOUNTER — Other Ambulatory Visit (INDEPENDENT_AMBULATORY_CARE_PROVIDER_SITE_OTHER): Payer: Medicare Other

## 2016-06-30 ENCOUNTER — Other Ambulatory Visit: Payer: Self-pay | Admitting: Emergency Medicine

## 2016-06-30 DIAGNOSIS — Z131 Encounter for screening for diabetes mellitus: Secondary | ICD-10-CM

## 2016-06-30 DIAGNOSIS — Z13220 Encounter for screening for lipoid disorders: Secondary | ICD-10-CM | POA: Diagnosis not present

## 2016-06-30 DIAGNOSIS — R972 Elevated prostate specific antigen [PSA]: Secondary | ICD-10-CM | POA: Diagnosis not present

## 2016-06-30 DIAGNOSIS — Z8639 Personal history of other endocrine, nutritional and metabolic disease: Secondary | ICD-10-CM | POA: Diagnosis not present

## 2016-06-30 DIAGNOSIS — Z0000 Encounter for general adult medical examination without abnormal findings: Secondary | ICD-10-CM

## 2016-06-30 DIAGNOSIS — E785 Hyperlipidemia, unspecified: Secondary | ICD-10-CM

## 2016-06-30 LAB — COMPREHENSIVE METABOLIC PANEL
ALBUMIN: 4.2 g/dL (ref 3.5–5.2)
ALK PHOS: 47 U/L (ref 39–117)
ALT: 19 U/L (ref 0–53)
AST: 24 U/L (ref 0–37)
BILIRUBIN TOTAL: 1.5 mg/dL — AB (ref 0.2–1.2)
BUN: 26 mg/dL — ABNORMAL HIGH (ref 6–23)
CHLORIDE: 105 meq/L (ref 96–112)
CO2: 29 mEq/L (ref 19–32)
CREATININE: 0.89 mg/dL (ref 0.40–1.50)
Calcium: 9.6 mg/dL (ref 8.4–10.5)
GFR: 89.84 mL/min (ref >60.00)
Glucose, Bld: 118 mg/dL — ABNORMAL HIGH (ref 70–99)
Potassium: 4.7 mEq/L (ref 3.5–5.1)
Sodium: 140 mEq/L (ref 135–145)
TOTAL PROTEIN: 6.8 g/dL (ref 6.0–8.3)

## 2016-06-30 LAB — POC URINALSYSI DIPSTICK (AUTOMATED)
BILIRUBIN UA: NEGATIVE
Blood, UA: NEGATIVE
GLUCOSE UA: NEGATIVE
LEUKOCYTES UA: NEGATIVE
NITRITE UA: NEGATIVE
Spec Grav, UA: >=1.03 — AB (ref 1.010–1.025)
UROBILINOGEN UA: 0.2 U/dL
pH, UA: 6 (ref 5.0–8.0)

## 2016-06-30 LAB — LIPID PANEL
CHOLESTEROL: 156 mg/dL (ref 0–200)
HDL: 72.2 mg/dL (ref >39.00)
LDL Cholesterol: 77 mg/dL (ref 0–99)
NonHDL: 84.19
Total CHOL/HDL Ratio: 2
Triglycerides: 36 mg/dL (ref 0.0–149.0)
VLDL: 7.2 mg/dL (ref 0.0–40.0)

## 2016-06-30 LAB — CBC
HEMATOCRIT: 40.2 % (ref 39.0–52.0)
HEMOGLOBIN: 13.5 g/dL (ref 13.0–17.0)
MCHC: 33.5 g/dL (ref 30.0–36.0)
MCV: 91.4 fl (ref 78.0–100.0)
PLATELETS: 241 10*3/uL (ref 150.0–400.0)
RBC: 4.4 Mil/uL (ref 4.22–5.81)
RDW: 13.7 % (ref 11.5–15.5)
WBC: 10 10*3/uL (ref 4.0–10.5)

## 2016-06-30 LAB — PSA: PSA: 1.06 ng/mL (ref 0.10–4.00)

## 2016-06-30 LAB — FERRITIN: FERRITIN: 76.9 ng/mL (ref 22.0–322.0)

## 2016-07-01 LAB — TESTOSTERONE TOTAL,FREE,BIO, MALES
Albumin: 4 g/dL (ref 3.6–5.1)
Sex Hormone Binding: 47 nmol/L (ref 22–77)
TESTOSTERONE BIOAVAILABLE: 52.9 ng/dL — AB (ref 110.0–575.0)
Testosterone, Free: 28.8 pg/mL — ABNORMAL LOW (ref 46.0–224.0)
Testosterone: 299 ng/dL (ref 250–827)

## 2016-07-03 ENCOUNTER — Encounter: Payer: Self-pay | Admitting: Family Medicine

## 2016-07-04 ENCOUNTER — Ambulatory Visit (HOSPITAL_BASED_OUTPATIENT_CLINIC_OR_DEPARTMENT_OTHER)
Admission: RE | Admit: 2016-07-04 | Discharge: 2016-07-04 | Disposition: A | Discharge status: Home / Self Care | Payer: Medicare Other | Source: Ambulatory Visit | Attending: Family Medicine | Admitting: Family Medicine

## 2016-07-04 ENCOUNTER — Ambulatory Visit (INDEPENDENT_AMBULATORY_CARE_PROVIDER_SITE_OTHER): Payer: Medicare Other | Admitting: Family Medicine

## 2016-07-04 ENCOUNTER — Encounter: Payer: Self-pay | Admitting: Family Medicine

## 2016-07-04 VITALS — BP 130/76 | HR 91 | Temp 98.2°F | Resp 16 | Ht 73.25 in | Wt 205.4 lb

## 2016-07-04 DIAGNOSIS — J209 Acute bronchitis, unspecified: Secondary | ICD-10-CM

## 2016-07-04 DIAGNOSIS — R05 Cough: Secondary | ICD-10-CM | POA: Diagnosis not present

## 2016-07-04 MED ORDER — ALBUTEROL SULFATE 108 (90 BASE) MCG/ACT IN AEPB: 2.0000 | INHALATION_SPRAY | Freq: Four times a day (QID) | RESPIRATORY_TRACT | 1 refills | Status: DC | PRN

## 2016-07-04 MED ORDER — PREDNISONE 10 MG PO TABS: ORAL_TABLET | ORAL | 0 refills | Status: DC

## 2016-07-04 MED ORDER — PROMETHAZINE-DM 6.25-15 MG/5ML PO SYRP: ORAL_SOLUTION | ORAL | 0 refills | Status: DC

## 2016-07-04 NOTE — Patient Instructions (Signed)

## 2016-07-04 NOTE — Progress Notes (Signed)
Patient ID: Jonathan Garrison, male   DOB: 13-Dec-1945, 71 y.o.   MRN: [id number]     Subjective:  I acted as a Education administrator for Dr. Carollee Garrison.  Jonathan Garrison, [name]   Patient ID: Jonathan Garrison, male    DOB: Dec 16, 1945, 71 y.o.   MRN: [id number]  Chief Complaint  Patient presents with  . Follow-up    bronchitis    HPI  Patient is in today for follow up bronchitis.   He states that his cough is a pinch better.  He has noticed some wheezing.  His temp in the mornings have been running 99.5 or 99.6 and in the evening it would go up to about 100.6.  He is on day 6 of taking doxycyline.  He is feeling better today.    Patient Care Team: Garrison, Jonathan Filler, MD as PCP - General (Family Medicine) Jonathan Shipper, MD (Gastroenterology) Jonathan Bob, MD (Orthopedic Surgery)   Past Medical History:  Diagnosis Date  . ADD (attention deficit disorder) without hyperactivity   . Allergy   . Anxiety   . Arthralgia    many joints, managed with vitamins/herbs and ibuprofen  . Chest pain   . Colitis 1986   single attack (believes was ulcerative)  . Depression   . Eczema   . Esophageal stricture   . GERD (gastroesophageal reflux disease)   . Hemorrhoids   . Lichen planus   . Neuromuscular disorder ([place])    POLIO AGE 34  . 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 Pleurisy 1984  . Polio age 60  . Umbilical hernia     Past Surgical History:  Procedure Laterality Date  . COLONOSCOPY    . LEG SURGERY  when in 6th grade   L leg, bone spur removal (just above knee, laterally)  . TONGUE SURGERY     for tongue cancer    Family History  Problem Relation Age of Onset  . Hypertension Mother   . Diabetes Mother   . Hyperlipidemia Mother   . Hypertension Father   . Benign prostatic hyperplasia Father   . Heart disease Father   . Lichen planus Sister   . Cancer Sister        ?spot on chest? contained  . Diabetes Maternal Grandfather   . Colon cancer Neg Hx   . Esophageal cancer Neg Hx     . Stomach cancer Neg Hx   . Pancreatic cancer Neg Hx   . Liver disease Neg Hx     Social History   Social History  . Marital status: Divorced    Spouse name: N/A  . Number of children: N/A  . Years of education: N/A   Occupational History  . 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 Smoking status: Former Smoker    Types: Pipe, Cigarettes    Quit date: 01/25/1976  . Smokeless tobacco: Never Used     Comment: Quit smoking 35 years ago  . Alcohol use 4.2 - 8.4 oz/week    7 - 14 Glasses of wine per week  . Drug use: No  . Sexual activity: Not on file   Other Topics Concern  . Not on file   Social History Narrative  . No narrative on file    Outpatient Medications Prior to Visit  Medication Sig Dispense Refill  . aspirin 81 MG tablet Take 81 mg by mouth daily.    Marland Kitchen b complex  vitamins tablet Take 0.5 tablets by mouth daily.      . Biotin 5000 MCG CAPS Take 1 capsule by mouth 2 (two) times daily.    . Calcium-Magnesium-Zinc 167-83-8 MG TABS Take by mouth. 1/2 tablet daily    . chlorpheniramine (CHLOR-TRIMETON) 4 MG tablet Take 2 mg by mouth every 6 (six) hours as needed.     . Cholecalciferol (VITAMIN D3) 2000 UNITS capsule Take 2,000 Units by mouth daily.    . clotrimazole (LOTRIMIN) 1 % cream Apply 1 application topically 2 (two) times daily.    Marland Kitchen doxycycline (VIBRAMYCIN) 100 MG capsule Take 1 capsule (100 mg total) by mouth 2 (two) times daily. 20 capsule 0  . fluticasone (FLONASE) 50 MCG/ACT nasal spray PLACE 2 SPRAYS INTO BOTH NOSTRILS DAILY 16 g 11  . glucosamine-chondroitin 500-400 MG tablet Take 1 tablet by mouth 2 (two) times daily.      . hydrocortisone cream 1 % Apply 1 application topically 2 (two) times daily. 30 g 0  . ketoconazole (NIZORAL) 2 % cream Apply 1 application topically daily. 15 g 0  . meloxicam (MOBIC) 15 MG tablet Take 1 tablet (15 mg total) by mouth daily. As needed for joint and muscle pain 90 tablet 2   . Multiple Vitamins-Minerals (MULTIVITAMIN WITH MINERALS) tablet Take 1 tablet by mouth daily.      . NON FORMULARY ALJ -herbal decongestant  Takes as needed    . omeprazole (PRILOSEC) 40 MG capsule TAKE 1 CAPSULE BY MOUTH EVERY DAY, TAKE 30 MINUTES BEFORE A MEAL 30 capsule 11  . sildenafil (REVATIO) 20 MG tablet Take 2-3 tablets one hour prior to intercourse 60 tablet 5  . sildenafil (VIAGRA) 100 MG tablet Take 0.5-1 tablets (50-100 mg total) by mouth daily as needed for erectile dysfunction. 5 tablet 5  . triamcinolone lotion (KENALOG) 0.1 % Apply 1 application topically 3 (three) times daily.    Marland Kitchen zinc oxide (BALMEX) 11.3 % CREA cream Apply 1 application topically 2 (two) times daily.    Marland Kitchen Zn-Pyg Afri-Nettle-Saw Palmet (SAW PALMETTO COMPLEX PO) Take 1 tablet by mouth 2 (two) times daily.     No facility-administered medications prior to visit.     Allergies  Allergen Reactions  . Penicillins     REACTION: ?  . Sulfa Antibiotics Other (See Comments)    unknown    Review of Systems  Constitutional: Negative for fever and malaise/fatigue.  HENT: Negative for congestion.   Eyes: Negative for blurred vision.  Respiratory: Positive for cough and wheezing. Negative for shortness of breath.   Cardiovascular: Negative for chest pain, palpitations and leg swelling.  Gastrointestinal: Negative for vomiting.  Musculoskeletal: Negative for back pain.  Skin: Negative for rash.  Neurological: Negative for loss of consciousness and headaches.       Objective:    Physical Exam  Constitutional: He is oriented to person, place, and time. Vital signs are normal. He appears well-developed and well-nourished. He is sleeping.  HENT:  Head: Normocephalic and atraumatic.  Mouth/Throat: Oropharynx is clear and moist.  Eyes: EOM are normal. Pupils are equal, round, and reactive to light.  Neck: Normal range of motion. Neck supple. No thyromegaly present.  Cardiovascular: Normal rate and regular  rhythm.   No murmur heard. Pulmonary/Chest: Effort normal. No respiratory distress. He has decreased breath sounds. He has wheezes. He has no rales. He exhibits no tenderness.  Musculoskeletal: He exhibits no edema or tenderness.  Neurological: He is alert and oriented to person,  place, and time.  Skin: Skin is warm and dry.  Psychiatric: He has a normal mood and affect. His behavior is normal. Judgment and thought content normal.  Nursing note and vitals reviewed.   BP 130/76 (BP Location: Left Arm, Cuff Size: Normal)   Pulse 91   Temp 98.2 F (36.8 C) (Oral)   Resp 16   Ht 6' 1.25" (1.861 m)   Wt 205 lb 6.4 oz (93.2 kg)   SpO2 97%   BMI 26.91 kg/m  Wt Readings from Last 3 Encounters:  07/04/16 205 lb 6.4 oz (93.2 kg)  06/29/16 204 lb 12.8 oz (92.9 kg)  02/09/16 211 lb (95.7 kg)   BP Readings from Last 3 Encounters:  07/04/16 130/76  06/29/16 (!) 152/84  02/09/16 124/70     Immunization History  Administered Date(s) Administered  . Influenza, High Dose Seasonal PF 12/09/2015  . Influenza,inj,Quad PF,36+ Mos 12/19/2014  . Influenza-Unspecified 11/07/2013  . Pneumococcal Conjugate-13 04/15/2014  . Pneumococcal Polysaccharide-23 12/09/2015  . Tdap 01/09/2007    Health Maintenance  Topic Date Due  . INFLUENZA VACCINE  08/24/2016  . TETANUS/TDAP  01/08/2017  . COLONOSCOPY  03/01/2020  . Hepatitis C Screening  Completed  . PNA vac Low Risk Adult  Completed    Lab Results  Component Value Date   WBC 10.0 06/30/2016   HGB 13.5 06/30/2016   HCT 40.2 06/30/2016   PLT 241.0 06/30/2016   GLUCOSE 118 (H) 06/30/2016   CHOL 156 06/30/2016   TRIG 36.0 06/30/2016   HDL 72.20 06/30/2016   LDLCALC 77 06/30/2016   ALT 19 06/30/2016   AST 24 06/30/2016   NA 140 06/30/2016   K 4.7 06/30/2016   CL 105 06/30/2016   CREATININE 0.89 06/30/2016   BUN 26 (H) 06/30/2016   CO2 29 06/30/2016   TSH 1.12 06/18/2015   PSA 1.06 06/30/2016    Lab Results  Component Value Date    TSH 1.12 06/18/2015   Lab Results  Component Value Date   WBC 10.0 06/30/2016   HGB 13.5 06/30/2016   HCT 40.2 06/30/2016   MCV 91.4 06/30/2016   PLT 241.0 06/30/2016   Lab Results  Component Value Date   NA 140 06/30/2016   K 4.7 06/30/2016   CO2 29 06/30/2016   GLUCOSE 118 (H) 06/30/2016   BUN 26 (H) 06/30/2016   CREATININE 0.89 06/30/2016   BILITOT 1.5 (H) 06/30/2016   ALKPHOS 47 06/30/2016   AST 24 06/30/2016   ALT 19 06/30/2016   PROT 6.8 06/30/2016   ALBUMIN 4.2 06/30/2016   ALBUMIN 4.0 06/30/2016   CALCIUM 9.6 06/30/2016   GFR 89.84 06/30/2016   Lab Results  Component Value Date   CHOL 156 06/30/2016   Lab Results  Component Value Date   HDL 72.20 06/30/2016   Lab Results  Component Value Date   LDLCALC 77 06/30/2016   Lab Results  Component Value Date   TRIG 36.0 06/30/2016   Lab Results  Component Value Date   CHOLHDL 2 06/30/2016   No results found for: HGBA1C       Assessment & Plan:   Problem List Items Addressed This Visit    None    Visit Diagnoses    Acute bronchitis, unspecified organism    -  Primary   Relevant Medications   predniSONE (DELTASONE) 10 MG tablet   Albuterol Sulfate (PROAIR RESPICLICK) 742 (90 Base) MCG/ACT AEPB   promethazine-dextromethorphan (PROMETHAZINE-DM) 6.25-15 MG/5ML syrup   Other Relevant  Orders   DG Chest 2 View      Cont doxycycline Get xray today-- we will call pt later with results   I am having Mr. Grantz start on predniSONE, Albuterol Sulfate, and promethazine-dextromethorphan. I am also having him maintain his glucosamine-chondroitin, multivitamin with minerals, b complex vitamins, chlorpheniramine, Zn-Pyg Afri-Nettle-Saw Palmet (SAW PALMETTO COMPLEX PO), Vitamin D3, Biotin, Calcium-Magnesium-Zinc, NON FORMULARY, sildenafil, aspirin, clotrimazole, triamcinolone lotion, zinc oxide, ketoconazole, hydrocortisone cream, omeprazole, doxycycline, fluticasone, meloxicam, and sildenafil.  Meds ordered this  encounter  Medications  . predniSONE (DELTASONE) 10 MG tablet    Sig: TAKE 3 TABLETS PO QD FOR 3 DAYS THEN TAKE 2 TABLETS PO QD FOR 3 DAYS THEN TAKE 1 TABLET PO QD FOR 3 DAYS THEN TAKE 1/2 TAB PO QD FOR 3 DAYS    Dispense:  20 tablet    Refill:  0  . Albuterol Sulfate (PROAIR RESPICLICK) 591 (90 Base) MCG/ACT AEPB    Sig: Inhale 2 puffs into the lungs 4 (four) times daily as needed.    Dispense:  1 each    Refill:  1  . promethazine-dextromethorphan (PROMETHAZINE-DM) 6.25-15 MG/5ML syrup    Sig: 1-2 tsp po qhs prn cough    Dispense:  118 mL    Refill:  0    CMA served as scribe during this visit. History, Physical and Plan performed by medical provider. Documentation and orders reviewed and attested to.  Ann Held, DO

## 2016-08-11 ENCOUNTER — Telehealth: Payer: Self-pay | Admitting: Family Medicine

## 2016-08-11 ENCOUNTER — Encounter: Payer: Self-pay | Admitting: Emergency Medicine

## 2016-08-11 NOTE — Telephone Encounter (Signed)
Pt called in to request to have his labs mailed to him at home at the address on file.

## 2016-08-11 NOTE — Telephone Encounter (Signed)
Most recent lab results mailed to patient's home address per pt request.

## 2016-10-08 DIAGNOSIS — R350 Frequency of micturition: Secondary | ICD-10-CM | POA: Diagnosis not present

## 2016-10-11 ENCOUNTER — Encounter: Payer: Self-pay | Admitting: Family Medicine

## 2016-10-15 NOTE — Progress Notes (Signed)
[facility] [address], [place], [place] [address] [phone]  Date:  10/17/2016   Name:  Jonathan Garrison   DOB:  Mar 07, 1945   MRN:  [id number]  PCP:  Darreld Mclean, MD    Chief Complaint: Weight Loss and Fever (comes and goes )   History of Present Illness:  Jonathan Garrison is a 71 y.o. very pleasant male patient who presents with the following:  History of ADD, B12 def, depression Here today for a recheck visit- I last saw him in June for his CPE, labs looked good at that time Here today with concern of weight loss and fever  Wt Readings from Last 3 Encounters:  10/17/16 201 lb (91.2 kg)  07/04/16 205 lb 6.4 oz (93.2 kg)  06/29/16 204 lb 12.8 oz (92.9 kg)   About 10 days ago he was seen at a [facility] in [place]- he was dx with a UTI and startred on bactrim.  However he reports that his urine culture was actually negative when it came back.    He also notes that his temp has been fluctuating over the last 6 weeks or so.  He will check his temp and it may be up to 99.5, 100.1.  His highest measured temp has been 100.7  He may have a temp for 2-3 days, then be afebrile for 3-4 days, alternating He has noted some mild headache He has not noted any particular joint pains, he does take ibuprofen and glucosamine on a regular basis for arthritis at baseline  The urinary frequency that triggered his visit to the ER is resolved  He has not noted any cough He is still taking the antibiotic - he was given 2 weeks worth of septra.  He has a sulfa allergy listed but has not had any reaction to this drug. His sulfa allergy was something he was told about by his mom, he did not have any recollection of it himself  He has not noted any rash.  Slight nausea on occasion but no vomiting No international travel He has pulled off several ticks this summer but non seemed to be attached for long enough to become  engorged   Lab Results  Component Value Date   PSA 1.06 06/30/2016   PSA 1.26 12/09/2015   PSA 2.68 06/18/2015     Patient Active Problem List   Diagnosis Date Noted  . Vitamin B12 deficiency anemia 06/09/2014  . Chest pain 03/13/2013  . Depression with anxiety 08/26/2011  . Overweight (BMI 25.0-29.9) 08/26/2011  . Joint pain 08/26/2011  . 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 Oral cancer ([place]) 07/09/2010    Past Medical History:  Diagnosis Date  . ADD (attention deficit disorder) without hyperactivity   . Allergy   . Anxiety   . Arthralgia    many joints, managed with vitamins/herbs and ibuprofen  . Chest pain   . Colitis 1986   single attack (believes was ulcerative)  . Depression   . Eczema   . Esophageal stricture   . GERD (gastroesophageal reflux disease)   . Hemorrhoids   . Lichen planus   . Neuromuscular disorder ([place])    POLIO AGE 4  . 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 Pleurisy 1984  . Polio age 63  . Umbilical hernia     Past Surgical History:  Procedure Laterality Date  . COLONOSCOPY    .  LEG SURGERY  when in 6th grade   L leg, bone spur removal (just above knee, laterally)  . TONGUE SURGERY     for tongue cancer    Social History  Substance Use Topics  . Smoking status: Former Smoker    Types: Pipe, Cigarettes    Quit date: 01/25/1976  . Smokeless tobacco: Never Used     Comment: Quit smoking 35 years ago  . Alcohol use 4.2 - 8.4 oz/week    7 - 14 Glasses of wine per week    Family History  Problem Relation Age of Onset  . Hypertension Mother   . Diabetes Mother   . Hyperlipidemia Mother   . Hypertension Father   . Benign prostatic hyperplasia Father   . Heart disease Father   . Lichen planus Sister   . Cancer Sister        ?spot on chest? contained  . Diabetes Maternal Grandfather   . Colon cancer Neg Hx   . Esophageal cancer Neg Hx   . Stomach cancer Neg Hx   . Pancreatic cancer Neg Hx   . Liver  disease Neg Hx     Allergies  Allergen Reactions  . Penicillins     REACTION: ?  . Sulfa Antibiotics Other (See Comments)    unknown    Medication list has been reviewed and updated.  Current Outpatient Prescriptions on File Prior to Visit  Medication Sig Dispense Refill  . aspirin 81 MG tablet Take 81 mg by mouth daily.    Marland Kitchen b complex vitamins tablet Take 0.5 tablets by mouth daily.      . Biotin 5000 MCG CAPS Take 1 capsule by mouth 2 (two) times daily.    . Calcium-Magnesium-Zinc 167-83-8 MG TABS Take by mouth. 1/2 tablet daily    . chlorpheniramine (CHLOR-TRIMETON) 4 MG tablet Take 2 mg by mouth every 6 (six) hours as needed.     . Cholecalciferol (VITAMIN D3) 2000 UNITS capsule Take 2,000 Units by mouth daily.    . clotrimazole (LOTRIMIN) 1 % cream Apply 1 application topically 2 (two) times daily.    . fluticasone (FLONASE) 50 MCG/ACT nasal spray PLACE 2 SPRAYS INTO BOTH NOSTRILS DAILY 16 g 11  . glucosamine-chondroitin 500-400 MG tablet Take 1 tablet by mouth 2 (two) times daily.      . hydrocortisone cream 1 % Apply 1 application topically 2 (two) times daily. 30 g 0  . ketoconazole (NIZORAL) 2 % cream Apply 1 application topically daily. 15 g 0  . meloxicam (MOBIC) 15 MG tablet Take 1 tablet (15 mg total) by mouth daily. As needed for joint and muscle pain 90 tablet 2  . Multiple Vitamins-Minerals (MULTIVITAMIN WITH MINERALS) tablet Take 1 tablet by mouth daily.      . NON FORMULARY ALJ -herbal decongestant  Takes as needed    . omeprazole (PRILOSEC) 40 MG capsule TAKE 1 CAPSULE BY MOUTH EVERY DAY, TAKE 30 MINUTES BEFORE A MEAL 30 capsule 11  . sildenafil (REVATIO) 20 MG tablet Take 2-3 tablets one hour prior to intercourse 60 tablet 5  . sildenafil (VIAGRA) 100 MG tablet Take 0.5-1 tablets (50-100 mg total) by mouth daily as needed for erectile dysfunction. 5 tablet 5  . triamcinolone lotion (KENALOG) 0.1 % Apply 1 application topically 3 (three) times daily.    Marland Kitchen zinc  oxide (BALMEX) 11.3 % CREA cream Apply 1 application topically 2 (two) times daily.    Marland Kitchen Zn-Pyg Afri-Nettle-Saw Palmet (SAW PALMETTO COMPLEX  PO) Take 1 tablet by mouth 2 (two) times daily.    . Albuterol Sulfate (PROAIR RESPICLICK) 536 (90 Base) MCG/ACT AEPB Inhale 2 puffs into the lungs 4 (four) times daily as needed. (Patient not taking: Reported on 10/17/2016) 1 each 1   No current facility-administered medications on file prior to visit.     Review of Systems:  As per HPI- otherwise negative.   Physical Examination: Vitals:   10/17/16 1403  BP: 136/80  Pulse: 85  Temp: 98.7 F (37.1 C)  SpO2: 98%   Vitals:   10/17/16 1403  Weight: 201 lb (91.2 kg)   Body mass index is 26.34 kg/m. Ideal Body Weight:    GEN: WDWN, NAD, Non-toxic, A & O x 3, looks well, mild overweight  HEENT: Atraumatic, Normocephalic. Neck supple. No masses, No LAD.  Bilateral TM wnl, oropharynx normal.  PEERL,EOMI.   Ears and Nose: No external deformity. CV: RRR, No M/G/R. No JVD. No thrill. No extra heart sounds. PULM: CTA B, no wheezes, crackles, rhonchi. No retractions. No resp. distress. No accessory muscle use. ABD: S, NT, ND, +BS. No rebound. No HSM. EXTR: No c/c/e NEURO Normal gait.  PSYCH: Normally interactive. Conversant. Not depressed or anxious appearing.  Calm demeanor.    Assessment and Plan: Fever of unknown origin - Plan: CBC, Comprehensive metabolic panel, TSH, Sedimentation rate, DG Chest 2 View, B. [place]dorfi Antibodies by WB  Need for influenza vaccination - Plan: Flu vaccine HIGH DOSE PF  Chronic neck pain - Plan: Ambulatory referral to Orthopedic Surgery  Here today with low grade fevers off an on for the last 6 weeks. He was seen at an UC about 10 days ago and treated with septra for a presumed UTI- however he has continued to have these fevers.  Will look for any other cause of his fevers as above.  He is tolerating septra without any problems so he is likely not truly  allergic  Signed Lamar Blinks, MD  Received his CXR and gave him a call.  Will dc his bactrim and change to azithromycin for apparent CAP He will come in for a repeat CRX in 3-4 weeks Await other labs and will be in touch with him asap Ordered repeat CXR for him to do as a radiology visit only   Dg Chest 2 View  Result Date: 10/17/2016 CLINICAL DATA:  Fever of unknown origin for the past 6 weeks with no other complaints. Former smoker. History of a oral malignancy. EXAM: CHEST  2 VIEW COMPARISON:  PA and lateral chest x-ray of July 04, 2016. FINDINGS: The lungs are well-expanded. There is new infiltrate inferiorly in the right upper lobe. The left lung is clear. There is no pleural effusion. The heart and pulmonary vascularity are normal. There is calcification in the wall of the aortic arch. The bony thorax exhibits no acute abnormality. IMPRESSION: Infiltrate in the posterior inferior aspect of the right upper lobe worrisome for pneumonia. Probable underlying chronic bronchitic changes. Follow-up radiographs in 2-3 weeks are recommended to assure complete clearing and exclude malignancy. Thoracic aortic atherosclerosis. Electronically Signed   By: David  Martinique M.D.   On: 10/17/2016 15:13

## 2016-10-17 ENCOUNTER — Ambulatory Visit (HOSPITAL_BASED_OUTPATIENT_CLINIC_OR_DEPARTMENT_OTHER)
Admission: RE | Admit: 2016-10-17 | Discharge: 2016-10-17 | Disposition: A | Discharge status: Home / Self Care | Payer: Medicare Other | Source: Ambulatory Visit | Attending: Family Medicine | Admitting: Family Medicine

## 2016-10-17 ENCOUNTER — Ambulatory Visit (INDEPENDENT_AMBULATORY_CARE_PROVIDER_SITE_OTHER): Payer: Medicare Other | Admitting: Family Medicine

## 2016-10-17 ENCOUNTER — Encounter: Payer: Self-pay | Admitting: Family Medicine

## 2016-10-17 VITALS — BP 136/80 | HR 85 | Temp 98.7°F | Wt 201.0 lb

## 2016-10-17 DIAGNOSIS — R509 Fever, unspecified: Secondary | ICD-10-CM

## 2016-10-17 DIAGNOSIS — G8929 Other chronic pain: Secondary | ICD-10-CM | POA: Diagnosis not present

## 2016-10-17 DIAGNOSIS — I700 Atherosclerosis of aorta: Secondary | ICD-10-CM | POA: Diagnosis not present

## 2016-10-17 DIAGNOSIS — Z23 Encounter for immunization: Secondary | ICD-10-CM | POA: Diagnosis not present

## 2016-10-17 DIAGNOSIS — M542 Cervicalgia: Secondary | ICD-10-CM | POA: Diagnosis not present

## 2016-10-17 DIAGNOSIS — R918 Other nonspecific abnormal finding of lung field: Secondary | ICD-10-CM | POA: Insufficient documentation

## 2016-10-17 DIAGNOSIS — J189 Pneumonia, unspecified organism: Secondary | ICD-10-CM | POA: Diagnosis not present

## 2016-10-17 MED ORDER — AZITHROMYCIN 250 MG PO TABS: ORAL_TABLET | ORAL | 0 refills | Status: DC

## 2016-10-17 NOTE — Patient Instructions (Signed)
It was good to see you today- I will be in touch with your labs and chest x-ray asap  Please continue to take and record your temperature 1-2x a day until we find the source of your temperatures

## 2016-10-18 LAB — CBC
HCT: 40 % (ref 39.0–52.0)
Hemoglobin: 13.4 g/dL (ref 13.0–17.0)
MCHC: 33.5 g/dL (ref 30.0–36.0)
MCV: 92.6 fl (ref 78.0–100.0)
PLATELETS: 400 10*3/uL (ref 150.0–400.0)
RBC: 4.32 Mil/uL (ref 4.22–5.81)
RDW: 13.5 % (ref 11.5–15.5)
WBC: 4.4 10*3/uL (ref 4.0–10.5)

## 2016-10-18 LAB — COMPREHENSIVE METABOLIC PANEL
ALBUMIN: 4.3 g/dL (ref 3.5–5.2)
ALT: 20 U/L (ref 0–53)
AST: 28 U/L (ref 0–37)
Alkaline Phosphatase: 55 U/L (ref 39–117)
BUN: 20 mg/dL (ref 6–23)
CALCIUM: 9.7 mg/dL (ref 8.4–10.5)
CO2: 30 meq/L (ref 19–32)
CREATININE: 1.04 mg/dL (ref 0.40–1.50)
Chloride: 101 mEq/L (ref 96–112)
GFR: 75 mL/min (ref >60.00)
Glucose, Bld: 83 mg/dL (ref 70–99)
Potassium: 4.8 mEq/L (ref 3.5–5.1)
Sodium: 137 mEq/L (ref 135–145)
Total Bilirubin: 0.4 mg/dL (ref 0.2–1.2)
Total Protein: 7.1 g/dL (ref 6.0–8.3)

## 2016-10-18 LAB — SEDIMENTATION RATE: SED RATE: 11 mm/h (ref 0–20)

## 2016-10-18 LAB — TSH: TSH: 1.06 u[IU]/mL (ref 0.35–4.50)

## 2016-10-19 LAB — B. BURGDORFI ANTIBODIES BY WB
B BURGDORFERI IGG ABS (IB): NEGATIVE
B BURGDORFERI IGM ABS (IB): NEGATIVE
LYME DISEASE 23 KD IGM: NONREACTIVE
LYME DISEASE 28 KD IGG: NONREACTIVE
LYME DISEASE 30 KD IGG: NONREACTIVE
LYME DISEASE 66 KD IGG: NONREACTIVE
LYME DISEASE 93 KD IGG: NONREACTIVE
Lyme Disease 18 kD IgG: NONREACTIVE
Lyme Disease 23 kD IgG: NONREACTIVE
Lyme Disease 39 kD IgG: NONREACTIVE
Lyme Disease 39 kD IgM: NONREACTIVE
Lyme Disease 41 kD IgG: NONREACTIVE
Lyme Disease 41 kD IgM: NONREACTIVE
Lyme Disease 45 kD IgG: NONREACTIVE
Lyme Disease 58 kD IgG: NONREACTIVE

## 2016-10-25 DIAGNOSIS — D229 Melanocytic nevi, unspecified: Secondary | ICD-10-CM | POA: Diagnosis not present

## 2016-10-25 DIAGNOSIS — L270 Generalized skin eruption due to drugs and medicaments taken internally: Secondary | ICD-10-CM | POA: Diagnosis not present

## 2016-10-25 DIAGNOSIS — L570 Actinic keratosis: Secondary | ICD-10-CM | POA: Diagnosis not present

## 2016-11-04 ENCOUNTER — Encounter: Payer: Self-pay | Admitting: Family Medicine

## 2016-11-04 DIAGNOSIS — R300 Dysuria: Secondary | ICD-10-CM

## 2016-11-10 ENCOUNTER — Encounter: Payer: Self-pay | Admitting: Family Medicine

## 2016-11-10 ENCOUNTER — Ambulatory Visit (HOSPITAL_BASED_OUTPATIENT_CLINIC_OR_DEPARTMENT_OTHER)
Admission: RE | Admit: 2016-11-10 | Discharge: 2016-11-10 | Disposition: A | Discharge status: Home / Self Care | Payer: Medicare Other | Source: Ambulatory Visit | Attending: Family Medicine | Admitting: Family Medicine

## 2016-11-10 ENCOUNTER — Other Ambulatory Visit (INDEPENDENT_AMBULATORY_CARE_PROVIDER_SITE_OTHER): Payer: Medicare Other

## 2016-11-10 DIAGNOSIS — R300 Dysuria: Secondary | ICD-10-CM

## 2016-11-10 DIAGNOSIS — J189 Pneumonia, unspecified organism: Secondary | ICD-10-CM | POA: Insufficient documentation

## 2016-11-11 LAB — URINALYSIS, MICROSCOPIC ONLY

## 2016-11-24 DIAGNOSIS — M5032 Other cervical disc degeneration, mid-cervical region, unspecified level: Secondary | ICD-10-CM | POA: Diagnosis not present

## 2016-11-24 DIAGNOSIS — M545 Low back pain: Secondary | ICD-10-CM | POA: Diagnosis not present

## 2016-11-24 DIAGNOSIS — M542 Cervicalgia: Secondary | ICD-10-CM | POA: Diagnosis not present

## 2016-11-24 DIAGNOSIS — M47816 Spondylosis without myelopathy or radiculopathy, lumbar region: Secondary | ICD-10-CM | POA: Diagnosis not present

## 2016-12-13 DIAGNOSIS — M545 Low back pain: Secondary | ICD-10-CM | POA: Diagnosis not present

## 2016-12-13 DIAGNOSIS — M5030 Other cervical disc degeneration, unspecified cervical region: Secondary | ICD-10-CM | POA: Diagnosis not present

## 2016-12-13 DIAGNOSIS — M542 Cervicalgia: Secondary | ICD-10-CM | POA: Diagnosis not present

## 2016-12-13 DIAGNOSIS — M2560 Stiffness of unspecified joint, not elsewhere classified: Secondary | ICD-10-CM | POA: Diagnosis not present

## 2016-12-27 DIAGNOSIS — D229 Melanocytic nevi, unspecified: Secondary | ICD-10-CM | POA: Diagnosis not present

## 2016-12-27 DIAGNOSIS — D492 Neoplasm of unspecified behavior of bone, soft tissue, and skin: Secondary | ICD-10-CM | POA: Diagnosis not present

## 2016-12-27 DIAGNOSIS — L821 Other seborrheic keratosis: Secondary | ICD-10-CM | POA: Diagnosis not present

## 2016-12-27 DIAGNOSIS — L570 Actinic keratosis: Secondary | ICD-10-CM | POA: Diagnosis not present

## 2017-01-06 ENCOUNTER — Encounter: Payer: Self-pay | Admitting: Family Medicine

## 2017-01-10 MED ORDER — DOXYCYCLINE HYCLATE 100 MG PO CAPS: 100.0000 mg | ORAL_CAPSULE | Freq: Two times a day (BID) | ORAL | 0 refills | Status: DC

## 2017-01-10 NOTE — Addendum Note (Signed)
Addended by: Lamar Blinks C on: 01/10/2017 12:10 PM   Modules accepted: Orders

## 2017-01-12 ENCOUNTER — Encounter: Payer: Self-pay | Admitting: Family Medicine

## 2017-01-12 ENCOUNTER — Ambulatory Visit (INDEPENDENT_AMBULATORY_CARE_PROVIDER_SITE_OTHER): Payer: Medicare Other | Admitting: Family Medicine

## 2017-01-12 VITALS — BP 122/70 | HR 92 | Temp 98.2°F | Resp 16 | Ht 73.0 in | Wt 194.6 lb

## 2017-01-12 DIAGNOSIS — I6523 Occlusion and stenosis of bilateral carotid arteries: Secondary | ICD-10-CM | POA: Diagnosis not present

## 2017-01-12 DIAGNOSIS — F988 Other specified behavioral and emotional disorders with onset usually occurring in childhood and adolescence: Secondary | ICD-10-CM

## 2017-01-12 DIAGNOSIS — S61402A Unspecified open wound of left hand, initial encounter: Secondary | ICD-10-CM

## 2017-01-12 DIAGNOSIS — Z23 Encounter for immunization: Secondary | ICD-10-CM

## 2017-01-12 MED ORDER — AMPHETAMINE-DEXTROAMPHETAMINE 20 MG PO TABS: 20.0000 mg | ORAL_TABLET | Freq: Two times a day (BID) | ORAL | 0 refills | Status: DC

## 2017-01-12 NOTE — Patient Instructions (Addendum)
Good to see you today- you got your tetanus booster today.  This is good for the next 10 years Please start back on your adderall- 5 or 10 mg once or twice a day as needed Let me know if any problems with this medication   We will arrange for a repeat ultrasound of your carotid arteries in the next couple of weeks   Good luck with getting your place cleaned out- hang in there!

## 2017-01-12 NOTE — Progress Notes (Signed)
[place] at [facility] [address], [place], [place] [address] [phone]  Date:  01/12/2017   Name:  Jonathan Garrison   DOB:  Jun 09, 1945   MRN:  [id number]  PCP:  Darreld Mclean, MD    Chief Complaint: No chief complaint on file.   History of Present Illness:  Jonathan Garrison is a 71 y.o. very pleasant male patient who presents with the following:  Follow-up visit today He had contacted me over mychart earlier this month: although i function well in the outside world, i have had and continue to have a problem with hoarding, clutter, ADD, and OCD. inside my house is a shambles and with my conditions mentioned i cannot make real progress cleaning it out. this situation is affection my mental, emotional, and physical health.  i had my first session with my new psychologist Dr. Mel Almond of [facility] today, and we agreed that i should go back on generic Adderal = "D-Amphetamine Salt combo 20 mg Tab," quantity 60. i gave up the Adderal, which i actually divide in half, when i retired 5 years back, but i need it again to dig myself out.  please check my past records and you will see that is the dosage i used to have prescribed and that i have a strong heart indicated by a stress test a couple of years back.    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 However as above he has noted hoarding tendencies getting worse, and he does not feel up to handling all of his tasks.  He really wants to clean out  his home but cannot get started.  He wonders if I can put him back on adderall, he has been off this for about 5 years.  He tolerated fine back when he was taking it- no issues with significant SE noted  He did have a normal stress back in 2015 and has no concerns about his   He is seeing a counselor and hopes that she will be able to help continue to guide him towards better mental health.  He has rented a storage unit and hopes to start cleaning out his home which is a start  He needs a tetanus booster today- his shot is 70 years old, and he did cut his left thumb a few days ago   He had a carotid US 3 years ago, and did have a bit of plaque - he was not sure if he was meant to repeat this and would like to have a repeat screening if ok   BP Readings from Last 3 Encounters:  01/12/17 (!) 146/69  10/17/16 136/80  07/04/16 130/76    Patient Active Problem List   Diagnosis Date Noted  . Vitamin B12 deficiency anemia 06/09/2014  . Chest pain 03/13/2013  . Depression with anxiety 08/26/2011  . Overweight (BMI 25.0-29.9) 08/26/2011  . Joint pain 08/26/2011  . ADD (attention deficit disorder)  07/09/2010  . 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 ADD (attention deficit disorder) without hyperactivity   . Allergy   . Anxiety   . Arthralgia    many joints, managed with vitamins/herbs and ibuprofen  . Chest pain   . Colitis 1986   single attack (believes was ulcerative)  . Depression   . Eczema   . Esophageal stricture   . GERD (gastroesophageal reflux disease)   . Hemorrhoids   . Lichen planus   . Neuromuscular disorder ([place])    POLIO AGE 54  . 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 Pleurisy 1984  . Polio age 35  . Umbilical hernia     Past Surgical History:  Procedure Laterality Date  . COLONOSCOPY    . LEG SURGERY  when in 6th grade   L leg, bone spur removal (just above knee, laterally)  . TONGUE SURGERY     for  tongue cancer    Social History   Tobacco Use  . Smoking status: Former Smoker    Types: Pipe, Cigarettes    Last attempt to quit: 01/25/1976    Years since quitting: 40.9  . Smokeless tobacco: Never Used  . Tobacco comment: Quit smoking 35 years ago  Substance Use Topics  . Alcohol use: Yes    Alcohol/week: 4.2 - 8.4 oz    Types: 7 - 14 Glasses of wine per week  . Drug use: No    Family History  Problem Relation Age of Onset  . Hypertension Mother   . Diabetes Mother   . Hyperlipidemia Mother   . Hypertension Father   . Benign prostatic hyperplasia Father   . Heart disease Father   . Lichen planus Sister   . Cancer Sister        ?spot on chest? contained  . Diabetes Maternal Grandfather   . Colon cancer Neg Hx   . Esophageal cancer Neg Hx   . Stomach cancer Neg Hx   . Pancreatic cancer Neg Hx   . Liver disease Neg Hx     Allergies  Allergen Reactions  . Penicillins     REACTION: ?  . Sulfa Antibiotics Other (See Comments)    unknown    Medication list has been reviewed and updated.  Current Outpatient Medications on File Prior to Visit  Medication Sig Dispense Refill  . Albuterol Sulfate (PROAIR RESPICLICK) 431 (90 Base) MCG/ACT AEPB Inhale 2 puffs into the lungs 4 (four) times daily as needed. (Patient not taking: Reported on 10/17/2016) 1 each 1  . aspirin 81 MG tablet Take 81 mg by mouth daily.    Marland Kitchen b complex vitamins tablet Take 0.5 tablets by mouth daily.      . Biotin 5000 MCG CAPS Take 1 capsule by mouth 2 (two) times daily.    . Calcium-Magnesium-Zinc 167-83-8 MG TABS Take by mouth. 1/2 tablet daily    . chlorpheniramine (CHLOR-TRIMETON) 4 MG tablet Take 2 mg by mouth every 6 (six) hours as needed.     . Cholecalciferol (VITAMIN D3) 2000 UNITS capsule Take 2,000 Units by mouth daily.    . clotrimazole (LOTRIMIN) 1 % cream Apply 1 application topically 2 (two) times daily.    Marland Kitchen doxycycline (VIBRAMYCIN) 100 MG capsule Take 1 capsule (100 mg total) by  mouth 2 (two) times daily. 20 capsule 0  . fluticasone (FLONASE) 50 MCG/ACT nasal spray PLACE 2 SPRAYS INTO BOTH  NOSTRILS DAILY 16 g 11  . glucosamine-chondroitin 500-400 MG tablet Take 1 tablet by mouth 2 (two) times daily.      . hydrocortisone cream 1 % Apply 1 application topically 2 (two) times daily. 30 g 0  . ketoconazole (NIZORAL) 2 % cream Apply 1 application topically daily. 15 g 0  . meloxicam (MOBIC) 15 MG tablet Take 1 tablet (15 mg total) by mouth daily. As needed for joint and muscle pain 90 tablet 2  . Multiple Vitamins-Minerals (MULTIVITAMIN WITH MINERALS) tablet Take 1 tablet by mouth daily.      . NON FORMULARY ALJ -herbal decongestant  Takes as needed    . omeprazole (PRILOSEC) 40 MG capsule TAKE 1 CAPSULE BY MOUTH EVERY DAY, TAKE 30 MINUTES BEFORE A MEAL 30 capsule 11  . sildenafil (REVATIO) 20 MG tablet Take 2-3 tablets one hour prior to intercourse 60 tablet 5  . sildenafil (VIAGRA) 100 MG tablet Take 0.5-1 tablets (50-100 mg total) by mouth daily as needed for erectile dysfunction. 5 tablet 5  . triamcinolone lotion (KENALOG) 0.1 % Apply 1 application topically 3 (three) times daily.    Marland Kitchen zinc oxide (BALMEX) 11.3 % CREA cream Apply 1 application topically 2 (two) times daily.    Marland Kitchen Zn-Pyg Afri-Nettle-Saw Palmet (SAW PALMETTO COMPLEX PO) Take 1 tablet by mouth 2 (two) times daily.     No current facility-administered medications on file prior to visit.     Review of Systems:  As per HPI- otherwise negative. No fever or chills No CP or SOB   Physical Examination: Vitals:   01/12/17 1315  BP: (!) 146/69  Pulse: 92  Resp: 16  Temp: 98.2 F (36.8 C)  SpO2: 99%   Vitals:   01/12/17 1315  Weight: 194 lb 9.6 oz (88.3 kg)  Height: 6\' 1"  (1.854 m)   Body mass index is 25.67 kg/m. Ideal Body Weight: Weight in (lb) to have BMI = 25: 189.1  GEN: WDWN, NAD, Non-toxic, A & O x 3, normal weight, looks well HEENT: Atraumatic, Normocephalic. Neck supple. No masses,  No LAD. Ears and Nose: No external deformity. CV: RRR, No M/G/R. No JVD. No thrill. No extra heart sounds. PULM: CTA B, no wheezes, crackles, rhonchi. No retractions. No resp. distress. No accessory muscle use. ABD: S, NT, ND, +BS. No rebound. No HSM. EXTR: No c/c/e NEURO Normal gait.  PSYCH: Normally interactive. Conversant. Not depressed or anxious appearing.  Calm demeanor.  Wound on left thumb- recent, but healing,  No need to suture   Assessment and Plan: Attention deficit disorder (ADD) without hyperactivity - Plan: amphetamine-dextroamphetamine (ADDERALL) 20 MG tablet  Open wound of left hand without foreign body, unspecified wound type, initial encounter - Plan: Td vaccine greater than or equal to 7yo preservative free IM  Carotid stenosis, asymptomatic, bilateral - Plan: US Carotid Duplex Bilateral, CANCELED: US Carotid Duplex Bilateral  Here today to discuss going back on adderall- he used this in the past, stopped it about 5 years ago.  However, he feels like he would like to go back on this medication now which is fine  Will rx 20 BID which was his max dose in the past, but he plans to start with 5-10 MG BID and titrate up as needed Boosted tetanus today He was told that he had some carotid stenosis in the past and is not sure if this needs to be rechecked.  Will order an [place] for him   Signed Lamar Blinks, MD

## 2017-02-24 ENCOUNTER — Other Ambulatory Visit: Payer: Self-pay | Admitting: Internal Medicine

## 2017-02-24 NOTE — Telephone Encounter (Signed)
Patient states he needs an emergency refill of medication omeprazole sent to [facility]. Pt states that if he needs to schedule ov to just call him back and let him know.

## 2017-03-08 ENCOUNTER — Encounter: Payer: Self-pay | Admitting: Internal Medicine

## 2017-03-08 ENCOUNTER — Telehealth: Payer: Self-pay | Admitting: Internal Medicine

## 2017-03-08 ENCOUNTER — Encounter: Payer: Self-pay | Admitting: Family Medicine

## 2017-03-09 MED ORDER — OMEPRAZOLE 40 MG PO CPDR: DELAYED_RELEASE_CAPSULE | ORAL | 0 refills | Status: DC

## 2017-03-09 NOTE — Telephone Encounter (Signed)
Refilled 90 days worth of Omeprazole.  Patient needs to keep upcoming appointment for further refills

## 2017-03-28 ENCOUNTER — Ambulatory Visit
Admission: RE | Admit: 2017-03-28 | Discharge: 2017-03-28 | Disposition: A | Discharge status: Home / Self Care | Payer: Medicare Other | Source: Ambulatory Visit | Attending: Family Medicine | Admitting: Family Medicine

## 2017-03-28 DIAGNOSIS — I6523 Occlusion and stenosis of bilateral carotid arteries: Secondary | ICD-10-CM

## 2017-03-28 DIAGNOSIS — I6521 Occlusion and stenosis of right carotid artery: Secondary | ICD-10-CM | POA: Diagnosis not present

## 2017-03-30 ENCOUNTER — Encounter: Payer: Self-pay | Admitting: Family Medicine

## 2017-04-07 DIAGNOSIS — H40013 Open angle with borderline findings, low risk, bilateral: Secondary | ICD-10-CM | POA: Diagnosis not present

## 2017-04-19 NOTE — Progress Notes (Signed)
[place] at [facility] [address] [place], [place] [address] [phone]  Date:  04/20/2017   Name:  Jonathan Garrison   DOB:  08-Aug-1945   MRN:  [id number]  PCP:  Darreld Mclean, MD    Chief Complaint: Follow-up (Pt here for 3 month f/u visit. )   History of Present Illness:  Jonathan Garrison is a 72 y.o. 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 However as above he has noted hoarding tendencies getting worse, and he does not feel up to handling all of his tasks.  He really wants to clean out his home but cannot get started.  He wonders if I can put him back on adderall, he has been off this for about 5 years.  He tolerated fine back when he was taking it- no issues with significant SE noted He did have a normal stress back in 2015 and has no concerns about his  He is seeing a counselor and hopes that she will be able to help continue to guide him towards better mental health.  He has rented a storage unit and hopes to start cleaning out his home which is a start  We put him on 20 mg of adderall BID- he was to start on 10 qd or BID and titrate up as needed.  We also repeated a carotid US for him which looked fine  He is taking 10 mg of adderall once in the morning, but he is not sure if this is really helping to motivate him to get his house organized.  He has not really made any progress as of yet. He wonders about going up to 20 mg, but is concerned and wanted to check with me first  [place]: not able to get into system to review  BP Readings from Last 3 Encounters:  04/20/17 140/82  01/12/17 122/70  10/17/16 136/80   He needs [place] to look at his  ears and throat today- he has felt like he might have a cold for the last 4-5 days No fever Mild cough  Does not feel all that bad but wants me to check him  He wonders about his baby aspirin- ?should he continue to take it He uses flonase for allergies- wonders if this is ok He is using meloxicam- 15 mg, for various aches and pains   His last CPE was in June- he would like to schedule this soon  He also has questions about his long standing hammer toes on his right foot and would like a podiatry referral   He sees Henrene Pastor for his GI issues- will see him soon   He uses sildenafil and this is helpful for him Patient Active Problem List   Diagnosis Date Noted  . Vitamin B12 deficiency anemia 06/09/2014  . Chest pain 03/13/2013  . Depression with anxiety 08/26/2011  . Overweight (BMI 25.0-29.9) 08/26/2011  . Joint pain 08/26/2011  . 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 ADD (attention deficit disorder) without hyperactivity   . Allergy   . Anxiety   .  Arthralgia    many joints, managed with vitamins/herbs and ibuprofen  . Chest pain   . Colitis 1986   single attack (believes was ulcerative)  . Depression   . Eczema   . Esophageal stricture   . GERD (gastroesophageal reflux disease)   . Hemorrhoids   . Lichen planus   . Neuromuscular disorder ([place])    POLIO AGE 67  . 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 Pleurisy 1984  . Polio age 41  . Umbilical hernia     Past Surgical History:  Procedure Laterality Date  . COLONOSCOPY    . LEG SURGERY  when in 6th grade   L leg, bone spur removal (just above knee, laterally)  . TONGUE SURGERY     for tongue cancer    Social History   Tobacco Use  . Smoking status: Former Smoker    Types: Pipe, Cigarettes    Last attempt to quit: 01/25/1976    Years since quitting: 41.2  . Smokeless tobacco: Never Used  . Tobacco comment: Quit  smoking 35 years ago  Substance Use Topics  . Alcohol use: Yes    Alcohol/week: 4.2 - 8.4 oz    Types: 7 - 14 Glasses of wine per week  . Drug use: No    Family History  Problem Relation Age of Onset  . Hypertension Mother   . Diabetes Mother   . Hyperlipidemia Mother   . Hypertension Father   . Benign prostatic hyperplasia Father   . Heart disease Father   . Lichen planus Sister   . Cancer Sister        ?spot on chest? contained  . Diabetes Maternal Grandfather   . Colon cancer Neg Hx   . Esophageal cancer Neg Hx   . Stomach cancer Neg Hx   . Pancreatic cancer Neg Hx   . Liver disease Neg Hx     Allergies  Allergen Reactions  . Penicillins     REACTION: ?  . Sulfa Antibiotics Other (See Comments)    unknown    Medication list has been reviewed and updated.  Current Outpatient Medications on File Prior to Visit  Medication Sig Dispense Refill  . Albuterol Sulfate (PROAIR RESPICLICK) 811 (90 Base) MCG/ACT AEPB Inhale 2 puffs into the lungs 4 (four) times daily as needed. (Patient not taking: Reported on 10/17/2016) 1 each 1  . amphetamine-dextroamphetamine (ADDERALL) 20 MG tablet Take 1 tablet (20 mg total) by mouth 2 (two) times daily. 60 tablet 0  . aspirin 81 MG tablet Take 81 mg by mouth daily.    Marland Kitchen b complex vitamins tablet Take 0.5 tablets by mouth daily.      . Biotin 5000 MCG CAPS Take 1 capsule by mouth 2 (two) times daily.    . Calcium-Magnesium-Zinc 167-83-8 MG TABS Take by mouth. 1/2 tablet daily    . chlorpheniramine (CHLOR-TRIMETON) 4 MG tablet Take 2 mg by mouth every 6 (six) hours as needed.     . Cholecalciferol (VITAMIN D3) 2000 UNITS capsule Take 2,000 Units by mouth daily.    . clotrimazole (LOTRIMIN) 1 % cream Apply 1 application topically 2 (two) times daily.    Marland Kitchen doxycycline (VIBRAMYCIN) 100 MG capsule Take 1 capsule (100 mg total) by mouth 2 (two) times daily. 20 capsule 0  . fluticasone (FLONASE) 50 MCG/ACT nasal spray PLACE 2 SPRAYS INTO BOTH  NOSTRILS DAILY 16 g 11  . glucosamine-chondroitin 500-400 MG tablet Take 1  tablet by mouth 2 (two) times daily.      . hydrocortisone cream 1 % Apply 1 application topically 2 (two) times daily. 30 g 0  . ketoconazole (NIZORAL) 2 % cream Apply 1 application topically daily. 15 g 0  . meloxicam (MOBIC) 15 MG tablet Take 1 tablet (15 mg total) by mouth daily. As needed for joint and muscle pain 90 tablet 2  . Multiple Vitamins-Minerals (MULTIVITAMIN WITH MINERALS) tablet Take 1 tablet by mouth daily.      . NON FORMULARY ALJ -herbal decongestant  Takes as needed    . omeprazole (PRILOSEC) 40 MG capsule Take one capsule by mouth 30 minutes before a meal. 90 capsule 0  . sildenafil (REVATIO) 20 MG tablet Take 2-3 tablets one hour prior to intercourse 60 tablet 5  . sildenafil (VIAGRA) 100 MG tablet Take 0.5-1 tablets (50-100 mg total) by mouth daily as needed for erectile dysfunction. 5 tablet 5  . triamcinolone lotion (KENALOG) 0.1 % Apply 1 application topically 3 (three) times daily.    Marland Kitchen zinc oxide (BALMEX) 11.3 % CREA cream Apply 1 application topically 2 (two) times daily.    Marland Kitchen Zn-Pyg Afri-Nettle-Saw Palmet (SAW PALMETTO COMPLEX PO) Take 1 tablet by mouth 2 (two) times daily.     No current facility-administered medications on file prior to visit.     Review of Systems:  As per HPI- otherwise negative.   Physical Examination: Vitals:   04/20/17 1407  BP: 140/82  Pulse: 87  Temp: 98.7 F (37.1 C)  SpO2: 97%   Vitals:   04/20/17 1407  Weight: 202 lb 3.2 oz (91.7 kg)  Height: 6\' 2"  (1.88 m)   Body mass index is 25.96 kg/m. Ideal Body Weight: Weight in (lb) to have BMI = 25: 194.3  GEN: WDWN, NAD, Non-toxic, A & O x 3, normal weight, looks well HEENT: Atraumatic, Normocephalic. Neck supple. No masses, No LAD.  Bilateral TM wnl, oropharynx normal.  PEERL,EOMI.   Ears and Nose: No external deformity. CV: RRR, No M/G/R. No JVD. No thrill. No extra heart sounds. PULM: CTA B,  no wheezes, crackles, rhonchi. No retractions. No resp. distress. No accessory muscle use. ABD: S, NT, ND EXTR: No c/c/e NEURO Normal gait.  PSYCH: Normally interactive. Conversant. Not depressed or anxious appearing.  Calm demeanor.  He has hammer toes on his right foot, and also points out a few more concerns about his feet- areas that seem to be less well padded then they used to be and that get sore more easily   Assessment and Plan: Attention deficit disorder (ADD) without hyperactivity - Plan: amphetamine-dextroamphetamine (ADDERALL) 20 MG tablet  Hammer toe of right foot - Plan: Ambulatory referral to Podiatry  Viral URI  Encounter for medication review  Here today for a recheck visit Referral to podiatry to address concerns about his feet Refilled his adderall- he will try taking 20 mg and see how he does Appears to have a viral URI- discussed supportive care, he will let me know if not better [name] does have an anxiety disorder and tends to have a lot of questions and concerns.  Addressed as many as I was able to today, asked him to make another appt to discuss any further issues but he feels like he will be ok until his CPE coming up later on this year  Signed Lamar Blinks, MD

## 2017-04-20 ENCOUNTER — Ambulatory Visit (INDEPENDENT_AMBULATORY_CARE_PROVIDER_SITE_OTHER): Payer: Medicare Other | Admitting: Family Medicine

## 2017-04-20 VITALS — BP 122/82 | HR 87 | Temp 98.7°F | Ht 74.0 in | Wt 202.2 lb

## 2017-04-20 DIAGNOSIS — M2041 Other hammer toe(s) (acquired), right foot: Secondary | ICD-10-CM | POA: Diagnosis not present

## 2017-04-20 DIAGNOSIS — Z79899 Other long term (current) drug therapy: Secondary | ICD-10-CM | POA: Diagnosis not present

## 2017-04-20 DIAGNOSIS — J069 Acute upper respiratory infection, unspecified: Secondary | ICD-10-CM

## 2017-04-20 DIAGNOSIS — I6523 Occlusion and stenosis of bilateral carotid arteries: Secondary | ICD-10-CM

## 2017-04-20 DIAGNOSIS — F988 Other specified behavioral and emotional disorders with onset usually occurring in childhood and adolescence: Secondary | ICD-10-CM

## 2017-04-20 MED ORDER — AMPHETAMINE-DEXTROAMPHETAMINE 20 MG PO TABS: 20.0000 mg | ORAL_TABLET | Freq: Two times a day (BID) | ORAL | 0 refills | Status: DC

## 2017-04-20 NOTE — Patient Instructions (Addendum)
We will refer you to podiatry- [facility]- to look at your foot and hammer toes  I refilled your adderall  Will see you for a physical over the summer

## 2017-04-22 ENCOUNTER — Encounter: Payer: Self-pay | Admitting: Family Medicine

## 2017-04-25 ENCOUNTER — Encounter: Payer: Self-pay | Admitting: Internal Medicine

## 2017-04-25 ENCOUNTER — Ambulatory Visit (INDEPENDENT_AMBULATORY_CARE_PROVIDER_SITE_OTHER): Payer: Medicare Other | Admitting: Internal Medicine

## 2017-04-25 VITALS — BP 124/80 | HR 68 | Ht 73.5 in | Wt 200.4 lb

## 2017-04-25 DIAGNOSIS — K222 Esophageal obstruction: Secondary | ICD-10-CM | POA: Diagnosis not present

## 2017-04-25 DIAGNOSIS — K219 Gastro-esophageal reflux disease without esophagitis: Secondary | ICD-10-CM

## 2017-04-25 DIAGNOSIS — I6523 Occlusion and stenosis of bilateral carotid arteries: Secondary | ICD-10-CM | POA: Diagnosis not present

## 2017-04-25 DIAGNOSIS — R110 Nausea: Secondary | ICD-10-CM | POA: Diagnosis not present

## 2017-04-25 DIAGNOSIS — K429 Umbilical hernia without obstruction or gangrene: Secondary | ICD-10-CM | POA: Diagnosis not present

## 2017-04-25 MED ORDER — OMEPRAZOLE 40 MG PO CPDR: DELAYED_RELEASE_CAPSULE | ORAL | 3 refills | Status: DC

## 2017-04-25 NOTE — Patient Instructions (Signed)
Please follow up in one year 

## 2017-04-25 NOTE — Progress Notes (Signed)
HISTORY OF PRESENT ILLNESS:  Jonathan Garrison is a 72 y.o. male Followed in this office for erosive esophagitis complicated by peptic stricture requiring esophageal dilation, iron deficiency anemia secondary to the same, and colon cancer screening. He was last evaluated in this office 02/09/2016. See that dictation. He presents today for routine follow-up. He continues on omeprazole 40 mg in the morning. For the most part he is asymptomatic on medication. When he has rare breakthrough symptoms he will take an extra omeprazole. He denies any problems with dysphagia except for large pills. No trouble with food. No lower GI complaints. He wants me to assess his umbilical hernia. Review of blood work from September 2018 was unremarkable. Last CBC with hemoglobin 13.5 and normal MCV. His GI review of systems is only remarkable for transient nausea in the morning which has resolved  REVIEW OF SYSTEMS:  All non-GI ROS negative unless otherwise stated in the history of present illness except for arthritis, back pain, anxiety, depression, hearing problems, muscle cramps, skin rash, urinary leakage,  Past Medical History:  Diagnosis Date  . ADD (attention deficit disorder) without hyperactivity   . Allergy   . Anxiety   . Arthralgia    many joints, managed with vitamins/herbs and ibuprofen  . Chest pain   . Colitis 1986   single attack (believes was ulcerative)  . Depression   . Eczema   . Esophageal stricture   . GERD (gastroesophageal reflux disease)   . Hemorrhoids   . Lichen planus   . Neuromuscular disorder ([place])    POLIO AGE 48  . 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 Pleurisy 1984  . Polio age 32  . Umbilical hernia     Past Surgical History:  Procedure Laterality Date  . COLONOSCOPY    . LEG SURGERY  when in 6th grade   L leg, bone spur removal (just above knee, laterally)  . TONGUE SURGERY     for tongue cancer    Social History Kenai Fluegel  reports that he  quit smoking about 41 years ago. His smoking use included pipe and cigarettes. He has never used smokeless tobacco. He reports that he drinks about 4.2 - 8.4 oz of alcohol per week. He reports that he does not use drugs.  family history includes Benign prostatic hyperplasia in his father; Cancer in his sister; Diabetes in his maternal grandfather and mother; Heart disease in his father; Hyperlipidemia in his mother; Hypertension in his father and mother; Lichen planus in his sister.  Allergies  Allergen Reactions  . Penicillins     REACTION: ?  . Sulfa Antibiotics Other (See Comments)    unknown       PHYSICAL EXAMINATION: Vital signs: BP 124/80   Pulse 68   Ht 6' 1.5" (1.867 m)   Wt 200 lb 6.4 oz (90.9 kg)   SpO2 99%   BMI 26.08 kg/m   Constitutional: generally well-appearing, no acute distress Psychiatric: alert and oriented x3, cooperative Eyes: extraocular movements intact, anicteric, conjunctiva pink Mouth: oral pharynx moist, no lesions Neck: supple no lymphadenopathy Cardiovascular: heart regular rate and rhythm, no murmur Lungs: clear to auscultation bilaterally Abdomen: soft, nontender, nondistended, no obvious ascites, no peritoneal signs, normal bowel sounds, no organomegaly. Small . Umbilical defect without true hernia Rectal:omitted Extremities: no clubbing, cyanosis, or lower extremity edema bilaterally Skin: no lesions on visible extremities Neuro: No focal deficits. Cranial nerves intact  ASSESSMENT:  #1. GERD, complicated by erosive esophagitis  and peptic stricture. For the most part asymptomatic post dilation on PPI #2. Small periumbilical defect without true hernia #3. Screening colonoscopy 2012 negative #4. Transient nausea. Observe for now  PLAN:  #1. Reflux precautions #2. Continue omeprazole 40 mg daily #3. Routine office follow-up in 1 year. Contact the office in the interim for questions or problems #4. Routine follow-up screening colonoscopy  around 2022   25 minutes spent face-to-face with the patient. Greater than 50% a time use for counseling regarding his chronic GERD, its complicated features, and answering his questions regarding medical therapy, the need for follow-up endoscopy, and plans regarding transient nausea

## 2017-05-24 ENCOUNTER — Ambulatory Visit (INDEPENDENT_AMBULATORY_CARE_PROVIDER_SITE_OTHER): Payer: Medicare Other

## 2017-05-24 ENCOUNTER — Encounter: Payer: Self-pay | Admitting: Podiatry

## 2017-05-24 ENCOUNTER — Ambulatory Visit (INDEPENDENT_AMBULATORY_CARE_PROVIDER_SITE_OTHER): Payer: Medicare Other | Admitting: Podiatry

## 2017-05-24 DIAGNOSIS — M2041 Other hammer toe(s) (acquired), right foot: Secondary | ICD-10-CM

## 2017-05-29 NOTE — Progress Notes (Signed)
   HPI: 72 year old male presenting today as a new patient with a chief complaint of hammertoes of the right foot that have been present for several years. He also reports pain to the arch of the right foot as well as a callus to the right fourth toe. He has been taking Meloxicam for treatment with some relief. There are no modifying factors noted. 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 Patient is here for further evaluation and treatment.   Past Medical History:  Diagnosis Date  . ADD (attention deficit disorder) without hyperactivity   . Allergy   . Anxiety   . Arthralgia    many joints, managed with vitamins/herbs and ibuprofen  . Chest pain   . Colitis 1986   single attack (believes was ulcerative)  . Depression   . Eczema   . Esophageal stricture   . GERD (gastroesophageal reflux disease)   . Hemorrhoids   . Lichen planus   . Neuromuscular disorder ([place])    POLIO AGE 77  . 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 Pleurisy 1984  . Polio age 33  . Umbilical hernia       Objective: Physical Exam General: The patient is alert and oriented x3 in no acute distress.  Dermatology: Skin is cool, dry and supple bilateral lower extremities. Negative for open lesions or macerations.  Vascular: Palpable pedal pulses bilaterally. No edema or erythema noted. Capillary refill within normal limits.  Neurological: Epicritic and protective threshold grossly intact bilaterally.   Musculoskeletal Exam: All pedal and ankle joints range of motion within normal limits bilateral. Muscle strength 5/5 in all groups bilateral. Hammertoe contracture deformity noted to digits 2-5 of the right foot  Radiographic Exam: Hammertoe contracture deformity noted to the interphalangeal joints and MPJ of the respective hammertoe digits mentioned on clinical musculoskeletal exam.     Assessment: 1. Hammertoes 2-5 right    Plan of Care:  1. Patient  evaluated. X-Rays reviewed.  2. Recommended good shoe gear.  3. Recommended OTC insoles.  4. Return to clinic as needed.     Edrick Kins, DPM [facility]  Dr. Edrick Kins, DPM    [address], [place] [address]                Office [phone]  Fax [phone]

## 2017-06-13 ENCOUNTER — Encounter: Payer: Self-pay | Admitting: Family Medicine

## 2017-06-20 DIAGNOSIS — L608 Other nail disorders: Secondary | ICD-10-CM | POA: Diagnosis not present

## 2017-06-20 DIAGNOSIS — M6740 Ganglion, unspecified site: Secondary | ICD-10-CM | POA: Diagnosis not present

## 2017-06-20 DIAGNOSIS — L603 Nail dystrophy: Secondary | ICD-10-CM | POA: Diagnosis not present

## 2017-06-20 DIAGNOSIS — M7130 Other bursal cyst, unspecified site: Secondary | ICD-10-CM | POA: Diagnosis not present

## 2017-06-27 DIAGNOSIS — L439 Lichen planus, unspecified: Secondary | ICD-10-CM | POA: Diagnosis not present

## 2017-06-27 DIAGNOSIS — L570 Actinic keratosis: Secondary | ICD-10-CM | POA: Diagnosis not present

## 2017-07-03 ENCOUNTER — Telehealth: Payer: Self-pay | Admitting: *Deleted

## 2017-07-03 NOTE — Telephone Encounter (Signed)
Received Cologuard Order Cancellation: [id number]; order has been Cancelled because it has been Inactive, and has exceeded the 365 days from the Initial order; forwarded to provider/SLS 06/10

## 2017-07-05 ENCOUNTER — Telehealth: Payer: Self-pay | Admitting: *Deleted

## 2017-07-05 NOTE — Telephone Encounter (Signed)
Received Cologuard Order Cancellation: [id number]; order has been Cancelled because it has been Inactive, and has exceeded the 365 days from the Initial order; forwarded to provider/SLS 06/12

## 2017-07-11 NOTE — Progress Notes (Addendum)
Jonathan Garrison at [facility] [address] [place], [place] [address] [phone]  Date:  07/13/2017   Name:  Jonathan Garrison   DOB:  Nov 06, 1945   MRN:  [id number]  PCP:  Jonathan Mclean, MD    Chief Complaint: Annual Exam   History of Present Illness:  Jonathan Garrison is a 72 y.o. 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 However as above he has noted hoarding tendencies getting worse, and he does not feel up to handling all of his tasks.He really wants to clean out his home but cannot get started.He wonders if I can put him back on adderall, he has been off this for about 5 years. He tolerated fine back when he was taking it- no issues with significant SE noted He did have a normal stress back in 2015 and has no concerns about his  He is seeing a counselor and hopes that she will be able to help continue to guide him towards better mental health. He has rented a storage unit and hopes to start cleaning out his home which is a start We put him on 20 mg of adderall BID- he was to start on 10 qd or BID and titrate up as needed.  We also repeated a carotid US for him which looked fine He is taking 10 mg of adderall once in the morning, but he is not sure if this is really helping to motivate him to get his house organized.  He has not really made any progress as of yet. He wonders about going up to 20 mg, but is concerned and wanted to check with me first///////////////////////////////////////////////////////////  We then saw him again in March-  Referral to podiatry to address concerns about his feet Refilled his adderall- he will try taking 20 mg and see how he does Appears to have a viral URI-  discussed supportive care, he will let me know if not better Jonathan Garrison does have an anxiety disorder and tends to have a lot of questions and concerns.  Addressed as many as I was able to today, asked him to make another appt to discuss any further issues but he feels like he will be ok until his CPE coming up later on this year  Labs: due today- he is fasting today for his labs  Colon: 2012- good for another couple of years here  Immun: up to date on tetanus and pneumonia vaccines. Discussed shingrix with him- he is hoping to have this done by his pharmacy and is on their list for when they have supply available  His derm is Jonathan Garrison - they think that he might have lichen planus.  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  No recurrence noted  He is using fluocinonide gel on his recently noted mouth lesions and they seem to be healing.  This is  reassuring that this does not represent a recurrent cancer, but he does have short term DDS follow-up in any case He has changed to meloxicam from ibuprofen due to concern that ibuprofen could worsen lichen planus  He is going to see Jonathan Garrison and also his dentist at [facility] next week for a recheck visit   He has noted hip pains- more so on the left.  Getting worse over the last 3-6 months.  NKI He is still able to be quite active, but the pain is waking him up at night quite a bit  He does have an orthopedist who he can see, would like to get plain films today if we can  Pt also sent me the following mychart message:  re my previous message about a possible systemic issue that could relate to tongue ulcerations,   deteriorating fingernails, and crusts on my scalp... my long-time  dermatologist Jonathan Garrison and i   theorize it could be Lichen Planus... and so i think we should test for that.     my recent dermatologist Jonathan Garrison wondered about Pemphigus... is there a test ?   also Jonathan Garrison prescribed Fluocinonide Gel to be used on my tongue ulcerations.     i've stopped using Ibuprofen because that can be a trigger for Lichen Planus;   instead i'm using Meloxicam in the AM, plus baby Aspirin, and Naproxen sodium in the late PM.     also, FYI, i started using Fluorouracil Cream to treat pre-cancerous Keratoses on scalp and face.     i should mention that my hips are giving me some pain, esp. the left one;   i wake in the night with a deep ache sometimes. some tenderness in shoulders too.     He continues to work with his therapist and feels like his ability to concentrate, motivate and get things done is better,  He is taking adderall 10 mg once a day now- may be increasing to BID in the near future His mood has been satisfactory Lab Results  Component Value Date   PSA 1.35 07/13/2017   PSA 1.06 06/30/2016   PSA 1.26 12/09/2015   He is taking adderall 10 mg once a day only  Patient Active Problem List   Diagnosis Date Noted  . Osteopenia 08/01/2017  . Vitamin B12 deficiency anemia 06/09/2014  . Chest pain 03/13/2013  . Depression with anxiety 08/26/2011  . Overweight (BMI 25.0-29.9) 08/26/2011  . Joint pain 08/26/2011  . 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 Oral cancer ([place]) 07/09/2010    Past Medical History:  Diagnosis Date  . ADD (attention deficit disorder) without hyperactivity   . Allergy   . Anxiety   . Arthralgia    many joints, managed with vitamins/herbs and ibuprofen  . Chest pain   . Colitis 1986   single attack (believes was ulcerative)  . Depression   . Eczema   . Esophageal stricture   . GERD (gastroesophageal reflux disease)   . Hemorrhoids   . Lichen planus   . Neuromuscular disorder ([place])    POLIO AGE 74  .  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 Pleurisy 1984  . Polio age 75  . Umbilical hernia     Past Surgical History:  Procedure Laterality Date  . COLONOSCOPY    . LEG SURGERY  when in 6th grade   L leg, bone spur removal (just above knee, laterally)  . TONGUE SURGERY  for tongue cancer    Social History   Tobacco Use  . Smoking status: Former Smoker    Types: Pipe, Cigarettes    Last attempt to quit: 01/25/1976    Years since quitting: 41.6  . Smokeless tobacco: Never Used  . Tobacco comment: Quit smoking 35 years ago  Substance Use Topics  . Alcohol use: Yes    Alcohol/week: 4.2 - 8.4 oz    Types: 7 - 14 Glasses of wine per week  . Drug use: No    Family History  Problem Relation Age of Onset  . Hypertension Mother   . Diabetes Mother   . Hyperlipidemia Mother   . Hypertension Father   . Benign prostatic hyperplasia Father   . Heart disease Father   . Lichen planus Sister   . Cancer Sister        ?spot on chest? contained  . Diabetes Maternal Grandfather   . Colon cancer Neg Hx   . Esophageal cancer Neg Hx   . Stomach cancer Neg Hx   . Pancreatic cancer Neg Hx   . Liver disease Neg Hx     Allergies  Allergen Reactions  . Penicillins     REACTION: ?  . Sulfa Antibiotics Other (See Comments)    unknown    Medication list has been reviewed and updated.  Current Outpatient Medications on File Prior to Visit  Medication Sig Dispense Refill  . aspirin 81 MG tablet Take 81 mg by mouth daily.    Marland Kitchen b complex vitamins tablet Take 0.5 tablets by mouth daily.      . Biotin 5000 MCG CAPS Take 1 capsule by mouth 2 (two) times daily.    . Calcium-Magnesium-Zinc 167-83-8 MG TABS Take by mouth. 1/2 tablet daily    . chlorpheniramine (CHLOR-TRIMETON) 4 MG tablet Take 2 mg by mouth every 6 (six) hours as needed.     . Cholecalciferol (VITAMIN D3) 2000 UNITS capsule Take 2,000 Units by mouth daily.    . clotrimazole (LOTRIMIN) 1 % cream Apply 1  application topically 2 (two) times daily.    Marland Kitchen glucosamine-chondroitin 500-400 MG tablet Take 1 tablet by mouth 2 (two) times daily.      . hydrocortisone cream 1 % Apply 1 application topically 2 (two) times daily. 30 g 0  . ketoconazole (NIZORAL) 2 % cream Apply 1 application topically daily. 15 g 0  . meloxicam (MOBIC) 15 MG tablet Take 1 tablet (15 mg total) by mouth daily. As needed for joint and muscle pain 90 tablet 2  . Multiple Vitamins-Minerals (MULTIVITAMIN WITH MINERALS) tablet Take 1 tablet by mouth daily.      . NON FORMULARY ALJ -herbal decongestant  Takes as needed    . omeprazole (PRILOSEC) 40 MG capsule Take one capsule by mouth 30 minutes before a meal. 90 capsule 3  . sildenafil (REVATIO) 20 MG tablet Take 2-3 tablets one hour prior to intercourse 60 tablet 5  . sildenafil (VIAGRA) 100 MG tablet Take 0.5-1 tablets (50-100 mg total) by mouth daily as needed for erectile dysfunction. 5 tablet 5  . triamcinolone lotion (KENALOG) 0.1 % Apply 1 application topically 3 (three) times daily.    Marland Kitchen zinc oxide (BALMEX) 11.3 % CREA cream Apply 1 application topically 2 (two) times daily.    Marland Kitchen Zn-Pyg Afri-Nettle-Saw Palmet (SAW PALMETTO COMPLEX PO) Take 1 tablet by mouth 2 (two) times daily.     No current facility-administered medications on file prior to visit.  Review of Systems:  As per HPI- otherwise negative. He is physically active, no CP or SOB with exercise   Physical Examination: Vitals:   07/13/17 1009  BP: 132/80  Pulse: 83  Resp: 18  Temp: 98.1 F (36.7 C)  SpO2: 98%   Vitals:   07/13/17 1009  Weight: 199 lb 9.6 oz (90.5 kg)  Height: 6\' 2"  (1.88 m)   Body mass index is 25.63 kg/m. Ideal Body Weight: Weight in (lb) to have BMI = 25: 194.3  GEN: WDWN, NAD, Non-toxic, A & O x 3, looks well, tall build, normal weight HEENT: Atraumatic, Normocephalic. Neck supple. No masses, No LAD.  Bilateral TM wnl, oropharynx normal except he does Garrison out on ulcer  on the left side of his tongue.  PEERL,EOMI.   Ears and Nose: No external deformity. CV: RRR, No M/G/R. No JVD. No thrill. No extra heart sounds. PULM: CTA B, no wheezes, crackles, rhonchi. No retractions. No resp. distress. No accessory muscle use. ABD: S, NT, ND, +BS. No rebound. No HSM. EXTR: No c/c/e NEURO Normal gait.  PSYCH: Normally interactive. Conversant. Not depressed or anxious appearing.  Calm demeanor.    Assessment and Plan: Osteopenia determined by x-ray - Plan: DG Bone Density  Screening for diabetes mellitus - Plan: Comprehensive metabolic panel, Hemoglobin A1c  Increased prostate specific antigen (PSA) velocity - Plan: PSA  Screening for hyperlipidemia - Plan: Lipid panel  Screening for deficiency anemia - Plan: CBC  Pain of both hip joints - Plan: DG HIP UNILAT W OR W/O PELVIS 2-3 VIEWS LEFT, DG HIP UNILAT W OR W/O PELVIS 2-3 VIEWS RIGHT  Attention deficit disorder (ADD) without hyperactivity - Plan: amphetamine-dextroamphetamine (ADDERALL) 10 MG tablet  Elevated glucose - Plan: Hemoglobin A1c  Medication monitoring encounter - Plan: CBC  Environmental allergies - Plan: fluticasone (FLONASE) 50 MCG/ACT nasal spray  Osteopenia of both hips - Plan: DG Bone Density  CPE today Screening labs pending as above Plain films of his hipss Refilled adderall at 10 mg , 60 which should last at lease one month Obtain plain hip films today  Signed Lamar Blinks, MD  Received his x-rays as follows: Dg Bone Density  Result Date: 07/31/2017 EXAM: DUAL X-RAY ABSORPTIOMETRY (DXA) FOR BONE MINERAL DENSITY IMPRESSION: Owen C Oryn Casanova Your patient Aron Inge completed a BMD test on 07/31/2017 using the [place] (analysis version: 16.SP2) manufactured by [employer]. The following summarizes the results of our evaluation. PATIENT: Name: Keagon, Glascoe Patient ID: [id number] Birth Date: 04-Jul-1945 Height: 72.0 in. Gender: Male Measured: 07/31/2017 Weight: 198.8  lbs. Indications: Advanced Age, Caucasian, History of Osteopenia, Previous Tobacco User Fractures: Treatments: Calcium, Multivitamin, Vitamin D ASSESSMENT: The BMD measured at Femur Neck Left is 0.889 g/cm2 with a T-score of -1.1. This patient is considered osteopenic according to [facility]) criteria. Site Region Measured Date Measured Age WHO YA BMD Classification T-score AP Spine L1-L4 07/31/2017 70.9 Normal 2.9 1.533 g/cm2 DualFemur Neck Left 07/31/2017 70.9 years Osteopenia -1.1 0.889 g/cm2 World Health Organization [facility]) criteria for post-menopausal, Caucasian Women: Normal       T-score at or above -1 SD Osteopenia   T-score between -1 and -2.5 SD Osteoporosis T-score at or below -2.5 SD RECOMMENDATION: 1. All patients should optimize calcium and vitamin D intake. 2. Consider FDA-approved medical therapies in postmenopausal women and men aged 14 years and older, based on the following: a. A hip or vertebral(clinical or morphometric) fracture. b. T-Score < -2.5 at the  femoral neck or spine after appropriate evaluation to exclude secondary causes c. Low bone mass (T-score between -1.0 and -2.5 at the femoral neck or spine) and a 10 year probability of a hip fracture >3% or a 10 year probability of major osteoporosis-related fracture > 20% based on the [place]-adapted WHO algorithm d. Clinical judgement and/or patient preferences may indicate treatment for people with 10-year fracture probabilities above or below these levels FOLLOW-UP: Patients with diagnosis of osteoporosis or at high risk for fracture should have regular bone mineral density tests. For patients eligible for Medicare, routine testing is allowed once every 2 years. The testing frequency can be increased to one year for patients who have rapidly progressing disease, those who are receiving or discontinuing medical therapy to restore bone mass, or have additional risk factors. Patient: Josefa Half   Referring Physician: Darreld Garrison Birth Date: 04-15-1945 [age] Patient ID: [id number] Height: 72.0 in. Weight: 198.8 lbs. Measured: 07/31/2017 2:06:47 PM (16 SP 2) Gender: Male Ethnicity: White Analyzed: 07/31/2017 2:28:29 PM (16 SP 2) FRAX* 10-year Probability of Fracture Based on femoral neck BMD: DualFemur (Left) Major Osteoporotic Fracture: 9.1% Hip Fracture:                1.0% Population:                  [place] (Caucasian) Risk Factors:                None *FRAX is a Materials engineer of the [employer] of [employer] for Metabolic Bone Disease, a World Pharmacologist (WHO) [facility]. ASSESSMENT: The probability of a major osteoporotic fracture is 9.1% within the next ten years. The probability of a hip fracture is 1.0% within the next ten years. Electronically Signed   By: Earle Gell M.D.   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  From my review of your films (and remembering that I am not a radiologist!) you appear to have mild arthritis.  The radiologist did comment that you appear to have osteopenia, or thinning bones.   X-ray is not the best test for measuring bone density however, so I would suggest that we do a bone density scan which I will order for you.  Even though your x-rays do not look that bad, you can still have pain from arthritis. Certainly discuss with your orthopedist as needed!   Receive his labs 6/21  Results for orders placed or performed in visit on 07/13/17  CBC  Result Value Ref Range   WBC 3.9 (L) 4.0 - 10.5 K/uL   RBC 4.18 (L) 4.22 - 5.81 Mil/uL   Platelets 268.0 150.0 - 400.0 K/uL   Hemoglobin 12.9 (L) 13.0 - 17.0 g/dL   HCT 37.9 (L) 39.0 - 52.0 %   MCV 90.8 78.0 - 100.0 fl   MCHC 34.0 30.0 - 36.0 g/dL   RDW 14.1 11.5 - 15.5 %  Comprehensive metabolic panel  Result Value Ref Range   Sodium 141 135 - 145 mEq/L   Potassium 4.2 3.5 - 5.1 mEq/L   Chloride 105 96  - 112 mEq/L   CO2 29 19 - 32 mEq/L   Glucose, Bld 81 70 - 99 mg/dL   BUN 26 (H) 6 - 23 mg/dL   Creatinine, Ser 0.85 0.40 - 1.50 mg/dL  Total Bilirubin 1.1 0.2 - 1.2 mg/dL   Alkaline Phosphatase 46 39 - 117 U/L   AST 29 0 - 37 U/L   ALT 21 0 - 53 U/L   Total Protein 6.2 6.0 - 8.3 g/dL   Albumin 4.1 3.5 - 5.2 g/dL   Calcium 9.3 8.4 - 10.5 mg/dL   GFR 94.46 >60.00 mL/min  Hemoglobin A1c  Result Value Ref Range   Hgb A1c MFr Bld 5.4 4.6 - 6.5 %  Lipid panel  Result Value Ref Range   Cholesterol 178 0 - 200 mg/dL   Triglycerides 25.0 0.0 - 149.0 mg/dL   HDL 83.00 >39.00 mg/dL   VLDL 5.0 0.0 - 40.0 mg/dL   LDL Cholesterol 90 0 - 99 mg/dL   Total CHOL/HDL Ratio 2    NonHDL 95.14   PSA  Result Value Ref Range   PSA 1.35 0.10 - 4.00 ng/mL   Blood counts: overall good.  Your white cell count and hemoglobin are minimally low, but not far from your averages.  I am not alarmed but would suggest that we repeat in 4-6 months to look for any trend. Chemistry: looks great.  Slight increase in BUN likely due to fasting state.  Can repeat on a routine basis A1c: normal, no suggestion of diabetes or pre-diabetes Cholesterol: excellent! Lab Results      Component                Value               Date                      PSA                      1.35                07/13/2017                PSA                      1.06                06/30/2016                PSA                      1.26                12/09/2015           PSA trend is reassuring- repeat in one year.    Let's plan to visit in 4-6 months to repeat your CBC and discuss any other non- acute concerns you may have in the meantime Take care

## 2017-07-12 ENCOUNTER — Encounter: Payer: Medicare Other | Admitting: Family Medicine

## 2017-07-13 ENCOUNTER — Encounter: Payer: Self-pay | Admitting: Family Medicine

## 2017-07-13 ENCOUNTER — Ambulatory Visit (INDEPENDENT_AMBULATORY_CARE_PROVIDER_SITE_OTHER): Payer: Medicare Other | Admitting: Family Medicine

## 2017-07-13 ENCOUNTER — Ambulatory Visit (HOSPITAL_BASED_OUTPATIENT_CLINIC_OR_DEPARTMENT_OTHER)
Admission: RE | Admit: 2017-07-13 | Discharge: 2017-07-13 | Disposition: A | Discharge status: Home / Self Care | Payer: Medicare Other | Source: Ambulatory Visit | Attending: Family Medicine | Admitting: Family Medicine

## 2017-07-13 ENCOUNTER — Encounter: Payer: Medicare Other | Admitting: Family Medicine

## 2017-07-13 VITALS — BP 132/80 | HR 83 | Temp 98.1°F | Resp 18 | Ht 74.0 in | Wt 199.6 lb

## 2017-07-13 DIAGNOSIS — M85851 Other specified disorders of bone density and structure, right thigh: Secondary | ICD-10-CM | POA: Diagnosis not present

## 2017-07-13 DIAGNOSIS — Z5181 Encounter for therapeutic drug level monitoring: Secondary | ICD-10-CM

## 2017-07-13 DIAGNOSIS — M24852 Other specific joint derangements of left hip, not elsewhere classified: Secondary | ICD-10-CM | POA: Diagnosis not present

## 2017-07-13 DIAGNOSIS — R7309 Other abnormal glucose: Secondary | ICD-10-CM | POA: Diagnosis not present

## 2017-07-13 DIAGNOSIS — M47896 Other spondylosis, lumbar region: Secondary | ICD-10-CM | POA: Insufficient documentation

## 2017-07-13 DIAGNOSIS — Z130 Encounter for screening for diseases of the blood and blood-forming organs and certain disorders involving the immune mechanism: Secondary | ICD-10-CM

## 2017-07-13 DIAGNOSIS — Z131 Encounter for screening for diabetes mellitus: Secondary | ICD-10-CM

## 2017-07-13 DIAGNOSIS — Z13220 Encounter for screening for lipoid disorders: Secondary | ICD-10-CM

## 2017-07-13 DIAGNOSIS — M25552 Pain in left hip: Secondary | ICD-10-CM | POA: Diagnosis not present

## 2017-07-13 DIAGNOSIS — M85852 Other specified disorders of bone density and structure, left thigh: Secondary | ICD-10-CM | POA: Diagnosis not present

## 2017-07-13 DIAGNOSIS — Z0000 Encounter for general adult medical examination without abnormal findings: Secondary | ICD-10-CM | POA: Diagnosis not present

## 2017-07-13 DIAGNOSIS — M25551 Pain in right hip: Secondary | ICD-10-CM

## 2017-07-13 DIAGNOSIS — M8580 Other specified disorders of bone density and structure, unspecified site: Secondary | ICD-10-CM | POA: Insufficient documentation

## 2017-07-13 DIAGNOSIS — M24851 Other specific joint derangements of right hip, not elsewhere classified: Secondary | ICD-10-CM | POA: Diagnosis not present

## 2017-07-13 DIAGNOSIS — F988 Other specified behavioral and emotional disorders with onset usually occurring in childhood and adolescence: Secondary | ICD-10-CM

## 2017-07-13 DIAGNOSIS — Z9109 Other allergy status, other than to drugs and biological substances: Secondary | ICD-10-CM | POA: Diagnosis not present

## 2017-07-13 DIAGNOSIS — R972 Elevated prostate specific antigen [PSA]: Secondary | ICD-10-CM | POA: Diagnosis not present

## 2017-07-13 DIAGNOSIS — M160 Bilateral primary osteoarthritis of hip: Secondary | ICD-10-CM | POA: Diagnosis not present

## 2017-07-13 LAB — COMPREHENSIVE METABOLIC PANEL
ALBUMIN: 4.1 g/dL (ref 3.5–5.2)
ALT: 21 U/L (ref 0–53)
AST: 29 U/L (ref 0–37)
Alkaline Phosphatase: 46 U/L (ref 39–117)
BUN: 26 mg/dL — AB (ref 6–23)
CHLORIDE: 105 meq/L (ref 96–112)
CO2: 29 mEq/L (ref 19–32)
Calcium: 9.3 mg/dL (ref 8.4–10.5)
Creatinine, Ser: 0.85 mg/dL (ref 0.40–1.50)
GFR: 94.46 mL/min (ref >60.00)
Glucose, Bld: 81 mg/dL (ref 70–99)
POTASSIUM: 4.2 meq/L (ref 3.5–5.1)
SODIUM: 141 meq/L (ref 135–145)
TOTAL PROTEIN: 6.2 g/dL (ref 6.0–8.3)
Total Bilirubin: 1.1 mg/dL (ref 0.2–1.2)

## 2017-07-13 LAB — LIPID PANEL
CHOLESTEROL: 178 mg/dL (ref 0–200)
HDL: 83 mg/dL (ref >39.00)
LDL Cholesterol: 90 mg/dL (ref 0–99)
NonHDL: 95.14
TRIGLYCERIDES: 25 mg/dL (ref 0.0–149.0)
Total CHOL/HDL Ratio: 2
VLDL: 5 mg/dL (ref 0.0–40.0)

## 2017-07-13 LAB — CBC
HEMATOCRIT: 37.9 % — AB (ref 39.0–52.0)
HEMOGLOBIN: 12.9 g/dL — AB (ref 13.0–17.0)
MCHC: 34 g/dL (ref 30.0–36.0)
MCV: 90.8 fl (ref 78.0–100.0)
Platelets: 268 10*3/uL (ref 150.0–400.0)
RBC: 4.18 Mil/uL — AB (ref 4.22–5.81)
RDW: 14.1 % (ref 11.5–15.5)
WBC: 3.9 10*3/uL — AB (ref 4.0–10.5)

## 2017-07-13 LAB — PSA: PSA: 1.35 ng/mL (ref 0.10–4.00)

## 2017-07-13 LAB — HEMOGLOBIN A1C: HEMOGLOBIN A1C: 5.4 % (ref 4.6–6.5)

## 2017-07-13 MED ORDER — FLUTICASONE PROPIONATE 50 MCG/ACT NA SUSP: NASAL | 11 refills | Status: DC

## 2017-07-13 MED ORDER — AMPHETAMINE-DEXTROAMPHETAMINE 10 MG PO TABS: 10.0000 mg | ORAL_TABLET | Freq: Two times a day (BID) | ORAL | 0 refills | Status: DC

## 2017-07-13 NOTE — Patient Instructions (Signed)
Good to see you today- let me know what your doctors up at [facility] have to say! I will be in touch with your labs and with your x-ray reports   Health Maintenance, Male A healthy lifestyle and preventive care is important for your health and wellness. Ask your health care provider about what schedule of regular examinations is right for you. What should I know about weight and diet? Eat a Healthy Diet  Eat plenty of vegetables, fruits, whole grains, low-fat dairy products, and lean protein.  Do not eat a lot of foods high in solid fats, added sugars, or salt.  Maintain a Healthy Weight Regular exercise can help you achieve or maintain a healthy weight. You should:  Do at least 150 minutes of exercise each week. The exercise should increase your heart rate and make you sweat (moderate-intensity exercise).  Do strength-training exercises at least twice a week.  Watch Your Levels of Cholesterol and Blood Lipids  Have your blood tested for lipids and cholesterol every 5 years starting at 71 years of age. If you are at high risk for heart disease, you should start having your blood tested when you are 71 years old. You may need to have your cholesterol levels checked more often if: ? Your lipid or cholesterol levels are high. ? You are older than 71 years of age. ? You are at high risk for heart disease.  What should I know about cancer screening? Many types of cancers can be detected early and may often be prevented. Lung Cancer  You should be screened every year for lung cancer if: ? You are a current smoker who has smoked for at least 30 years. ? You are a former smoker who has quit within the past 15 years.  Talk to your health care provider about your screening options, when you should start screening, and how often you should be screened.  Colorectal Cancer  Routine colorectal cancer screening usually begins at 71 years of age and should be repeated every 5-10 years until you are  70 years old. You may need to be screened more often if early forms of precancerous polyps or small growths are found. Your health care provider may recommend screening at an earlier age if you have risk factors for colon cancer.  Your health care provider may recommend using home test kits to check for hidden blood in the stool.  A small camera at the end of a tube can be used to examine your colon (sigmoidoscopy or colonoscopy). This checks for the earliest forms of colorectal cancer.  Prostate and Testicular Cancer  Depending on your age and overall health, your health care provider may do certain tests to screen for prostate and testicular cancer.  Talk to your health care provider about any symptoms or concerns you have about testicular or prostate cancer.  Skin Cancer  Check your skin from head to toe regularly.  Tell your health care provider about any new moles or changes in moles, especially if: ? There is a change in a mole's size, shape, or color. ? You have a mole that is larger than a pencil eraser.  Always use sunscreen. Apply sunscreen liberally and repeat throughout the day.  Protect yourself by wearing long sleeves, pants, a wide-brimmed hat, and sunglasses when outside.  What should I know about heart disease, diabetes, and high blood pressure?  If you are 51-21 years of age, have your blood pressure checked every 3-5 years. If you are  58 years of age or older, have your blood pressure checked every year. You should have your blood pressure measured twice-once when you are at a hospital or clinic, and once when you are not at a hospital or clinic. Record the average of the two measurements. To check your blood pressure when you are not at a hospital or clinic, you can use: ? An automated blood pressure machine at a pharmacy. ? A home blood pressure monitor.  Talk to your health care provider about your target blood pressure.  If you are between 43-88 years old, ask  your health care provider if you should take aspirin to prevent heart disease.  Have regular diabetes screenings by checking your fasting blood sugar level. ? If you are at a normal weight and have a low risk for diabetes, have this test once every three years after the age of 66. ? If you are overweight and have a high risk for diabetes, consider being tested at a younger age or more often.  A one-time screening for abdominal aortic aneurysm (AAA) by ultrasound is recommended for men aged 56-75 years who are current or former smokers. What should I know about preventing infection? Hepatitis B If you have a higher risk for hepatitis B, you should be screened for this virus. Talk with your health care provider to find out if you are at risk for hepatitis B infection. Hepatitis C Blood testing is recommended for:  Everyone born from [birth year] through 1964.  Anyone with known risk factors for hepatitis C.  Sexually Transmitted Diseases (STDs)  You should be screened each year for STDs including gonorrhea and chlamydia if: ? You are sexually active and are younger than 71 years of age. ? You are older than 71 years of age and your health care provider tells you that you are at risk for this type of infection. ? Your sexual activity has changed since you were last screened and you are at an increased risk for chlamydia or gonorrhea. Ask your health care provider if you are at risk.  Talk with your health care provider about whether you are at high risk of being infected with HIV. Your health care provider may recommend a prescription medicine to help prevent HIV infection.  What else can I do?  Schedule regular health, dental, and eye exams.  Stay current with your vaccines (immunizations).  Do not use any tobacco products, such as cigarettes, chewing tobacco, and e-cigarettes. If you need help quitting, ask your health care provider.  Limit alcohol intake to no more than 2 drinks per day.  One drink equals 12 ounces of beer, 5 ounces of wine, or 1 ounces of hard liquor.  Do not use street drugs.  Do not share needles.  Ask your health care provider for help if you need support or information about quitting drugs.  Tell your health care provider if you often feel depressed.  Tell your health care provider if you have ever been abused or do not feel safe at home. This information is not intended to replace advice given to you by your health care provider. Make sure you discuss any questions you have with your health care provider. Document Released: 07/09/2007 Document Revised: 09/09/2015 Document Reviewed: 10/14/2014 Elsevier Interactive Patient Education  [employer].

## 2017-07-14 ENCOUNTER — Encounter: Payer: Self-pay | Admitting: Family Medicine

## 2017-07-20 DIAGNOSIS — K1329 Other disturbances of oral epithelium, including tongue: Secondary | ICD-10-CM | POA: Diagnosis not present

## 2017-07-29 ENCOUNTER — Encounter: Payer: Self-pay | Admitting: Family Medicine

## 2017-07-31 ENCOUNTER — Ambulatory Visit (HOSPITAL_BASED_OUTPATIENT_CLINIC_OR_DEPARTMENT_OTHER)
Admission: RE | Admit: 2017-07-31 | Discharge: 2017-07-31 | Disposition: A | Discharge status: Home / Self Care | Payer: Medicare Other | Source: Ambulatory Visit | Attending: Family Medicine | Admitting: Family Medicine

## 2017-07-31 DIAGNOSIS — M8580 Other specified disorders of bone density and structure, unspecified site: Secondary | ICD-10-CM

## 2017-07-31 DIAGNOSIS — M85852 Other specified disorders of bone density and structure, left thigh: Secondary | ICD-10-CM | POA: Insufficient documentation

## 2017-07-31 DIAGNOSIS — M85851 Other specified disorders of bone density and structure, right thigh: Secondary | ICD-10-CM | POA: Diagnosis present

## 2017-08-01 ENCOUNTER — Encounter: Payer: Self-pay | Admitting: Family Medicine

## 2017-08-01 DIAGNOSIS — M8580 Other specified disorders of bone density and structure, unspecified site: Secondary | ICD-10-CM | POA: Insufficient documentation

## 2017-08-16 ENCOUNTER — Encounter: Payer: Self-pay | Admitting: Family Medicine

## 2017-08-23 ENCOUNTER — Encounter: Payer: Self-pay | Admitting: Family Medicine

## 2017-08-25 NOTE — Addendum Note (Signed)
Addended by: Lamar Blinks C on: 08/25/2017 03:37 PM   Modules accepted: Level of Service

## 2017-09-05 DIAGNOSIS — K1329 Other disturbances of oral epithelium, including tongue: Secondary | ICD-10-CM | POA: Diagnosis not present

## 2017-09-15 ENCOUNTER — Encounter: Payer: Self-pay | Admitting: Family Medicine

## 2017-09-15 ENCOUNTER — Ambulatory Visit (INDEPENDENT_AMBULATORY_CARE_PROVIDER_SITE_OTHER): Payer: Medicare Other | Admitting: Family Medicine

## 2017-09-15 VITALS — BP 135/81 | HR 104 | Temp 98.6°F | Resp 16 | Ht 74.0 in | Wt 193.8 lb

## 2017-09-15 DIAGNOSIS — J029 Acute pharyngitis, unspecified: Secondary | ICD-10-CM

## 2017-09-15 DIAGNOSIS — I6523 Occlusion and stenosis of bilateral carotid arteries: Secondary | ICD-10-CM | POA: Diagnosis not present

## 2017-09-15 MED ORDER — AMOXICILLIN-POT CLAVULANATE 875-125 MG PO TABS
1.0000 | ORAL_TABLET | Freq: Two times a day (BID) | ORAL | 0 refills | Status: DC
Start: 2017-09-15 — End: 2017-09-28

## 2017-09-15 NOTE — Progress Notes (Signed)
Patient ID: Jonathan Garrison, male    DOB: Sep 11, 1945  Age: 72 y.o. MRN: [id number]    Subjective:  Subjective  HPI Jonathan Garrison presents for possible bronchitis and L ear pain about 10 days   + fever 99.5 , 100.2   Pt had wisdom tooth extracted and given amoxicillin a few weeks ago   He also had a bx of the tongue recently  Review of Systems  Constitutional: Positive for chills. Negative for fever.  HENT: Positive for congestion, postnasal drip, rhinorrhea and sinus pressure.   Respiratory: Positive for cough, chest tightness, shortness of breath and wheezing.   Cardiovascular: Negative for chest pain, palpitations and leg swelling.  Allergic/Immunologic: Negative for environmental allergies.    History Past Medical History:  Diagnosis Date  . ADD (attention deficit disorder) without hyperactivity   . Allergy   . Anxiety   . Arthralgia    many joints, managed with vitamins/herbs and ibuprofen  . Chest pain   . Colitis 1986   single attack (believes was ulcerative)  . Depression   . Eczema   . Esophageal stricture   . GERD (gastroesophageal reflux disease)   . Hemorrhoids   . Lichen planus   . Neuromuscular disorder ([place])    POLIO AGE 61  . 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 Pleurisy 1984  . Polio age 46  . Umbilical hernia     He has a past surgical history that includes Leg Surgery (when in 6th grade); Tongue surgery; and Colonoscopy.   His family history includes Benign prostatic hyperplasia in his father; Cancer in his sister; Diabetes in his maternal grandfather and mother; Heart disease in his father; Hyperlipidemia in his mother; Hypertension in his father and mother; Lichen planus in his sister.He reports that he quit smoking about 41 years ago. His smoking use included pipe and cigarettes. He has never used smokeless tobacco. He reports that he drinks about 7.0 - 14.0 standard drinks of alcohol per week. He reports that he does not use  drugs.  Current Outpatient Medications on File Prior to Visit  Medication Sig Dispense Refill  . amphetamine-dextroamphetamine (ADDERALL) 10 MG tablet Take 1 tablet (10 mg total) by mouth 2 (two) times daily. As needed for inattention 60 tablet 0  . aspirin 81 MG tablet Take 81 mg by mouth daily.    Marland Kitchen b complex vitamins tablet Take 0.5 tablets by mouth daily.      . Biotin 5000 MCG CAPS Take 1 capsule by mouth 2 (two) times daily.    . Calcium-Magnesium-Zinc 167-83-8 MG TABS Take by mouth. 1/2 tablet daily    . chlorpheniramine (CHLOR-TRIMETON) 4 MG tablet Take 2 mg by mouth every 6 (six) hours as needed.     . Cholecalciferol (VITAMIN D3) 2000 UNITS capsule Take 2,000 Units by mouth daily.    . clotrimazole (LOTRIMIN) 1 % cream Apply 1 application topically 2 (two) times daily.    . fluticasone (FLONASE) 50 MCG/ACT nasal spray PLACE 2 SPRAYS INTO BOTH NOSTRILS DAILY 16 g 11  . glucosamine-chondroitin 500-400 MG tablet Take 1 tablet by mouth 2 (two) times daily.      . hydrocortisone cream 1 % Apply 1 application topically 2 (two) times daily. 30 g 0  . ketoconazole (NIZORAL) 2 % cream Apply 1 application topically daily. 15 g 0  . meloxicam (MOBIC) 15 MG tablet Take 1 tablet (15 mg total) by mouth daily. As needed for joint and muscle  pain 90 tablet 2  . Multiple Vitamins-Minerals (MULTIVITAMIN WITH MINERALS) tablet Take 1 tablet by mouth daily.      . NON FORMULARY ALJ -herbal decongestant  Takes as needed    . omeprazole (PRILOSEC) 40 MG capsule Take one capsule by mouth 30 minutes before a meal. 90 capsule 3  . sildenafil (REVATIO) 20 MG tablet Take 2-3 tablets one hour prior to intercourse 60 tablet 5  . sildenafil (VIAGRA) 100 MG tablet Take 0.5-1 tablets (50-100 mg total) by mouth daily as needed for erectile dysfunction. 5 tablet 5  . triamcinolone lotion (KENALOG) 0.1 % Apply 1 application topically 3 (three) times daily.    Marland Kitchen zinc oxide (BALMEX) 11.3 % CREA cream Apply 1  application topically 2 (two) times daily.    Marland Kitchen Zn-Pyg Afri-Nettle-Saw Palmet (SAW PALMETTO COMPLEX PO) Take 1 tablet by mouth 2 (two) times daily.     No current facility-administered medications on file prior to visit.      Objective:  Objective  Physical Exam  Constitutional: He is oriented to person, place, and time. Vital signs are normal. He appears well-developed and well-nourished. He is sleeping.  HENT:  Head: Normocephalic and atraumatic.  Nose: Mucosal edema and sinus tenderness present.  Mouth/Throat: Oropharynx is clear and moist.  Eyes: Pupils are equal, round, and reactive to light. EOM are normal.  Neck: Normal range of motion. Neck supple. No thyromegaly present.  Cardiovascular: Normal rate and regular rhythm.  No murmur heard. Pulmonary/Chest: Effort normal. No respiratory distress. He has decreased breath sounds. He has no wheezes. He has no rales. He exhibits no tenderness.  Musculoskeletal: He exhibits no edema or tenderness.  Lymphadenopathy:    He has cervical adenopathy.  Neurological: He is alert and oriented to person, place, and time.  Skin: Skin is warm and dry.  Psychiatric: He has a normal mood and affect. His behavior is normal. Judgment and thought content normal.  Nursing note and vitals reviewed.  BP 135/81   Pulse (!) 104   Temp 98.6 F (37 C) (Oral)   Resp 16   Ht 6\' 2"  (1.88 m)   Wt 193 lb 12.8 oz (87.9 kg)   SpO2 98%   BMI 24.88 kg/m  Wt Readings from Last 3 Encounters:  09/15/17 193 lb 12.8 oz (87.9 kg)  07/13/17 199 lb 9.6 oz (90.5 kg)  04/25/17 200 lb 6.4 oz (90.9 kg)     Lab Results  Component Value Date   WBC 3.9 (L) 07/13/2017   HGB 12.9 (L) 07/13/2017   HCT 37.9 (L) 07/13/2017   PLT 268.0 07/13/2017   GLUCOSE 81 07/13/2017   CHOL 178 07/13/2017   TRIG 25.0 07/13/2017   HDL 83.00 07/13/2017   LDLCALC 90 07/13/2017   ALT 21 07/13/2017   AST 29 07/13/2017   NA 141 07/13/2017   K 4.2 07/13/2017   CL 105 07/13/2017    CREATININE 0.85 07/13/2017   BUN 26 (H) 07/13/2017   CO2 29 07/13/2017   TSH 1.06 10/17/2016   PSA 1.35 07/13/2017   HGBA1C 5.4 07/13/2017    Dg Bone Density  Result Date: 07/31/2017 EXAM: DUAL X-RAY ABSORPTIOMETRY (DXA) FOR BONE MINERAL DENSITY IMPRESSION: [place] [facility] [place] Your patient Jonathan Garrison completed a BMD test on 07/31/2017 using the [place] (analysis version: 16.SP2) manufactured by [employer]. The following summarizes the results of our evaluation. PATIENT: Name: Jonathan Garrison, Jonathan Garrison Patient ID: [id number] Birth Date: 01-28-1945 Height: 72.0 in. Gender: Male Measured: 07/31/2017  Weight: 198.8 lbs. Indications: Advanced Age, Caucasian, History of Osteopenia, Previous Tobacco User Fractures: Treatments: Calcium, Multivitamin, Vitamin D ASSESSMENT: The BMD measured at Femur Neck Left is 0.889 g/cm2 with a T-score of -1.1. This patient is considered osteopenic according to [place] [facility]) criteria. Site Region Measured Date Measured Age WHO YA BMD Classification T-score AP Spine L1-L4 07/31/2017 70.9 Normal 2.9 1.533 g/cm2 DualFemur Neck Left 07/31/2017 70.9 years Osteopenia -1.1 0.889 g/cm2 World Health Organization [facility]) criteria for post-menopausal, Caucasian Women: Normal       T-score at or above -1 SD Osteopenia   T-score between -1 and -2.5 SD Osteoporosis T-score at or below -2.5 SD RECOMMENDATION: 1. All patients should optimize calcium and vitamin D intake. 2. Consider FDA-approved medical therapies in postmenopausal women and men aged 18 years and older, based on the following: a. A hip or vertebral(clinical or morphometric) fracture. b. T-Score < -2.5 at the femoral neck or spine after appropriate evaluation to exclude secondary causes c. Low bone mass (T-score between -1.0 and -2.5 at the femoral neck or spine) and a 10 year probability of a hip fracture >3% or a 10 year probability of major osteoporosis-related fracture > 20% based on the [place]-adapted WHO algorithm d.  Clinical judgement and/or patient preferences may indicate treatment for people with 10-year fracture probabilities above or below these levels FOLLOW-UP: Patients with diagnosis of osteoporosis or at high risk for fracture should have regular bone mineral density tests. For patients eligible for Medicare, routine testing is allowed once every 2 years. The testing frequency can be increased to one year for patients who have rapidly progressing disease, those who are receiving or discontinuing medical therapy to restore bone mass, or have additional risk factors. Patient: Jonathan Garrison   Referring Physician: Darreld Mclean Birth Date: 12-08-1945 [age]:       [age] years Patient ID: [id number] Height: 72.0 in. Weight: 198.8 lbs. Measured: 07/31/2017 2:06:47 PM (16 SP 2) Gender: Male Ethnicity: White Analyzed: 07/31/2017 2:28:29 PM (16 SP 2) FRAX* 10-year Probability of Fracture Based on femoral neck BMD: DualFemur (Left) Major Osteoporotic Fracture: 9.1% Hip Fracture:                1.0% Population:                  [place] (Caucasian) Risk Factors:                None *FRAX is a Materials engineer of the [employer] of [employer] for Metabolic Bone Disease, a World Pharmacologist (WHO) [facility]. ASSESSMENT: The probability of a major osteoporotic fracture is 9.1% within the next ten years. The probability of a hip fracture is 1.0% within the next ten years. Electronically Signed   By: Earle Gell M.D.   On: 07/31/2017 14:55     Assessment & Plan:  Plan  I am having Jonathan Garrison "Jonathan Garrison" start on amoxicillin-clavulanate. I am also having him maintain his glucosamine-chondroitin, multivitamin with minerals, b complex vitamins, chlorpheniramine, Zn-Pyg Afri-Nettle-Saw Palmet (SAW PALMETTO COMPLEX PO), Vitamin D3, Biotin, Calcium-Magnesium-Zinc, NON FORMULARY, sildenafil, aspirin, clotrimazole, triamcinolone lotion, zinc oxide, ketoconazole, hydrocortisone cream, meloxicam, sildenafil,  omeprazole, amphetamine-dextroamphetamine, and fluticasone.  Meds ordered this encounter  Medications  . amoxicillin-clavulanate (AUGMENTIN) 875-125 MG tablet    Sig: Take 1 tablet by mouth 2 (two) times daily.    Dispense:  20 tablet    Refill:  0    Problem List Items Addressed This Visit  None    Visit Diagnoses    Pharyngitis, unspecified etiology    -  Primary   Relevant Medications   amoxicillin-clavulanate (AUGMENTIN) 875-125 MG tablet    I felt abx were needed due to the tongue bx and dental work he recently had along with sore throat and low grade fever Use flonase and otc antihistamine  Follow-up: Return if symptoms worsen or fail to improve.  Ann Held, DO

## 2017-09-15 NOTE — Patient Instructions (Signed)

## 2017-09-19 ENCOUNTER — Encounter: Payer: Self-pay | Admitting: Family Medicine

## 2017-09-19 NOTE — Progress Notes (Signed)
[place] [facility] [address], [place], [place] [address] [phone]  Date:  09/20/2017   Name:  Jonathan Garrison   DOB:  28-Oct-1945   MRN:  [id number]  PCP:  Darreld Mclean, MD    Chief Complaint: Ear Pain (bilateral, worse in left ear) and Low Grade Fever (took tylenol this morning, sore throat, taking augmentin)   History of Present Illness:  Jonathan Garrison is a 72 y.o. very pleasant male patient who presents with the following: Last seen by myself in June History of oral cancer, depression and anxiety (often related to health) here today with concern of ear ache  Seen by Dr. Etter Sjogren on 8/23: Jonathan Garrison presents for possible bronchitis and L ear pain about 10 days   + fever 99.5 , 100.2   Pt had wisdom tooth extracted and given amoxicillin a few weeks ago   He also had a bx of the tongue recently- he was treated with augmentin by Dr. Etter Sjogren  He has sent me 2 lengthy emails detailing his sx prior to our visit today as follows-  From last night: hello Dr. Lorelei Pont.   i saw Dr. Ivy Lynn on [address]. for poss. bronchitis;   she diagnosed pharyngitis and started me on Augmentin.   i look forward to seeing you tomorrow, WED., at 1:30 for a follow-up visit.   i am wondering if strep throat might be involved...   anyway i wanted to mention as background for our visit:   * i'm still ailing but may be a pinch better after 5 days of Augmentin   * my voice is hard to use and i still have some throat and ear pain   * my temperature has varied from last TH. to know from normal to 100.6 SAT. night    (from SUN. to MON. it varied from 98.9 to 110.3.Marland KitchenMarland Kitchen this TU. AM it was 99.5)   * i've not had much junk from my nose, just a bit of green stuff...    i do seem to have a pinch of drip in back of throat + inflamed throat = cough   * of poss. note there has been an odd little sore in my nose   * lastly i've experienced some nausea both before  and after meals or Augmentin   i have not felt this run-down in ages !! thank you for assessing my health situation.   Bill   And this morning: hi again Dr. Lorelei Pont.   following up on last night's message, re today's 1:30 PM appt.   * backgrounds:    - i had a wisdom tooth extraction a month ago with 10 days of Amoxicillin...     all seemed well and healthy afterwards but who knows.    - i had a biopsy a couple of weeks back under my tongue... unsure if relevant.    - i've been swimming in the [facility] which i thought was clean but prob. not i find.   * currently i've had 2 very uncomfortable nights:    - sharp pain in ears modulating to dull ache from ear to temple at times    - wheezing cough in upper chest with some pain and breathlessness    - headaches at night especially and in day on and off   so i'm wondering about ear infection, pneumonia, even meningitis ?   (or ruling them out if we  can)   i feel like a walking infection. :(    Pulse Readings from Last 3 Encounters:  09/20/17 (!) 108  09/15/17 (!) 104  07/13/17 83   BP Readings from Last 3 Encounters:  09/20/17 (!) 146/80  09/15/17 135/81  07/13/17 132/80   Pt notes that he was on amox for 10 days after his wisdom tooth extraction a month ago  He notes a hoarse voice and ST He is using cough drops and had some left over cough medicine that he used last night- however it did not really help and he was still up coughing during the night  He did take tylenol this am- just one He is also taking meloxicam for joint pains on a routine basis No belly pain, no vomiting No fever today He does feel that he is overall a bit better but wanted to make sure there is not any other concern at this time  He did go swimming in a river not long ago and wonders if this might be why he is sick Confirmed that temp from his first email yesterday- 113- was a typo, he meant 100.3 Patient Active Problem List    Diagnosis Date Noted  . Osteopenia 08/01/2017  . Vitamin B12 deficiency anemia 06/09/2014  . Chest pain 03/13/2013  . Depression with anxiety 08/26/2011  . Overweight (BMI 25.0-29.9) 08/26/2011  . Joint pain 08/26/2011  . 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 Oral cancer ([place]) 07/09/2010    Past Medical History:  Diagnosis Date  . ADD (attention deficit disorder) without hyperactivity   . Allergy   . Anxiety   . Arthralgia    many joints, managed with vitamins/herbs and ibuprofen  . Chest pain   . Colitis 1986   single attack (believes was ulcerative)  . Depression   . Eczema   . Esophageal stricture   . GERD (gastroesophageal reflux disease)   . Hemorrhoids   . Lichen planus   . Neuromuscular disorder ([place])    POLIO AGE 26  . Oral cancer ([place]) 05/2010   plans for surgery 06/2010  . Pleurisy 1984  . 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 Umbilical hernia     Past Surgical History:  Procedure Laterality Date  . COLONOSCOPY    . LEG SURGERY  when in 6th grade   L leg, bone spur removal (just above knee, laterally)  . TONGUE SURGERY     for tongue cancer    Social History   Tobacco Use  . Smoking status: Former Smoker    Types: Pipe, Cigarettes    Last attempt to quit: 01/25/1976    Years since quitting: 41.6  . Smokeless tobacco: Never Used  . Tobacco comment: Quit smoking 35 years ago  Substance Use Topics  . Alcohol use: Yes    Alcohol/week: 7.0 - 14.0 standard drinks    Types: 7 - 14 Glasses of wine per week  . Drug use: No    Family History  Problem Relation Age of Onset  . Hypertension Mother   . Diabetes Mother   . Hyperlipidemia Mother   . Hypertension Father   . Benign prostatic hyperplasia Father   . Heart disease Father   . Lichen planus Sister   . Cancer Sister        ?spot on chest? contained  . Diabetes Maternal Grandfather   . Colon cancer Neg Hx   . Esophageal cancer Neg Hx   . Stomach cancer Neg Hx   .  Pancreatic cancer Neg Hx   . Liver disease  Neg Hx     Allergies  Allergen Reactions  . Sulfa Antibiotics Other (See Comments)    unknown    Medication list has been reviewed and updated.  Current Outpatient Medications on File Prior to Visit  Medication Sig Dispense Refill  . amoxicillin-clavulanate (AUGMENTIN) 875-125 MG tablet Take 1 tablet by mouth 2 (two) times daily. 20 tablet 0  . amphetamine-dextroamphetamine (ADDERALL) 10 MG tablet Take 1 tablet (10 mg total) by mouth 2 (two) times daily. As needed for inattention 60 tablet 0  . aspirin 81 MG tablet Take 81 mg by mouth daily.    Marland Kitchen b complex vitamins tablet Take 0.5 tablets by mouth daily.      . Biotin 5000 MCG CAPS Take 1 capsule by mouth 2 (two) times daily.    . Calcium-Magnesium-Zinc 167-83-8 MG TABS Take by mouth. 1/2 tablet daily    . chlorpheniramine (CHLOR-TRIMETON) 4 MG tablet Take 2 mg by mouth every 6 (six) hours as needed.     . Cholecalciferol (VITAMIN D3) 2000 UNITS capsule Take 2,000 Units by mouth daily.    . fluticasone (FLONASE) 50 MCG/ACT nasal spray PLACE 2 SPRAYS INTO BOTH NOSTRILS DAILY 16 g 11  . glucosamine-chondroitin 500-400 MG tablet Take 1 tablet by mouth 2 (two) times daily.      . meloxicam (MOBIC) 15 MG tablet Take 1 tablet (15 mg total) by mouth daily. As needed for joint and muscle pain 90 tablet 2  . Multiple Vitamins-Minerals (MULTIVITAMIN WITH MINERALS) tablet Take 1 tablet by mouth daily.      . NON FORMULARY ALJ -herbal decongestant  Takes as needed    . omeprazole (PRILOSEC) 40 MG capsule Take one capsule by mouth 30 minutes before a meal. 90 capsule 3  . sildenafil (REVATIO) 20 MG tablet Take 2-3 tablets one hour prior to intercourse 60 tablet 5  . Zn-Pyg Afri-Nettle-Saw Palmet (SAW PALMETTO COMPLEX PO) Take 1 tablet by mouth 2 (two) times daily.     No current facility-administered medications on file prior to visit.     Review of Systems:  As per HPI- otherwise negative.   Physical Examination: Vitals:   09/20/17 1448   BP: (!) 146/80  Pulse: (!) 108  Resp: 16  Temp: 98.4 F (36.9 C)  SpO2: 97%   Vitals:   09/20/17 1448  Weight: 191 lb (86.6 kg)  Height: 6\' 2"  (1.88 m)   Body mass index is 24.52 kg/m. Ideal Body Weight: Weight in (lb) to have BMI = 25: 194.3  GEN: WDWN, NAD, Non-toxic, A & O x 3, looks well, occasional cough  HEENT: Atraumatic, Normocephalic. Neck supple. No masses, No LAD.  Bilateral TM wnl, oropharynx normal.  PEERL,EOMI.   No meningismus Ears and Nose: No external deformity. CV: RRR, No M/G/R. No JVD. No thrill. No extra heart sounds. PULM: CTA B, no wheezes, crackles, rhonchi. No retractions. No resp. distress. No accessory muscle use. ABD: S, NT, ND. No rebound. No HSM. EXTR: No c/c/e NEURO Normal gait.  PSYCH: Normally interactive. Conversant. Not depressed or anxious appearing.  Calm demeanor.    Assessment and Plan: Cough - Plan: HYDROcodone-homatropine (HYCODAN) 5-1.5 MG/5ML syrup, DISCONTINUED: HYDROcodone-homatropine (HYCODAN) 5-1.5 MG/5ML syrup  Here today with likely viral illness as he has not responded quickly to appropriate abx for a bacterial infection Discussed further testing such as BW and CXR At this time he does feel like he is making progress and  is ok with deferring further testing He would like to try a stronger cough med in hopes of getting a good nights sleep- rx for hycodan for him today He does not take any other sedating medications   Meds ordered this encounter  Medications  . DISCONTD: HYDROcodone-homatropine (HYCODAN) 5-1.5 MG/5ML syrup    Sig: Take 5 mLs by mouth every 8 (eight) hours as needed for up to 5 days for cough.    Dispense:  60 mL    Refill:  0  . HYDROcodone-homatropine (HYCODAN) 5-1.5 MG/5ML syrup    Sig: Take 5 mLs by mouth every 8 (eight) hours as needed for up to 5 days for cough.    Dispense:  60 mL    Refill:  0     Signed Lamar Blinks, MD

## 2017-09-20 ENCOUNTER — Encounter: Payer: Self-pay | Admitting: Family Medicine

## 2017-09-20 ENCOUNTER — Ambulatory Visit (INDEPENDENT_AMBULATORY_CARE_PROVIDER_SITE_OTHER): Payer: Medicare Other | Admitting: Family Medicine

## 2017-09-20 VITALS — BP 146/80 | HR 96 | Temp 98.4°F | Resp 16 | Ht 74.0 in | Wt 191.0 lb

## 2017-09-20 DIAGNOSIS — R05 Cough: Secondary | ICD-10-CM | POA: Diagnosis not present

## 2017-09-20 DIAGNOSIS — R059 Cough, unspecified: Secondary | ICD-10-CM

## 2017-09-20 DIAGNOSIS — I6523 Occlusion and stenosis of bilateral carotid arteries: Secondary | ICD-10-CM | POA: Diagnosis not present

## 2017-09-20 MED ORDER — HYDROCODONE-HOMATROPINE 5-1.5 MG/5ML PO SYRP: 5.0000 mL | ORAL_SOLUTION | Freq: Three times a day (TID) | ORAL | 0 refills | Status: DC | PRN

## 2017-09-20 MED ORDER — HYDROCODONE-HOMATROPINE 5-1.5 MG/5ML PO SYRP: 5.0000 mL | ORAL_SOLUTION | Freq: Three times a day (TID) | ORAL | 0 refills | Status: AC | PRN

## 2017-09-20 NOTE — Patient Instructions (Addendum)
It was good to see you today- I think that you likely have a viral infection, but please keep me posted as to how you are feeling!  We did an rx for cough syrup for you today- take this as needed, but do not use when you need to drive as it will make you drowsy You might try a 1/2 dose at first to make sure you are not overly sedated!    Please do let me know if you have any concerns, and try to rest and drink plenty of fluids I hope that you feel better soon

## 2017-09-21 ENCOUNTER — Other Ambulatory Visit: Payer: Self-pay | Admitting: Family Medicine

## 2017-09-21 DIAGNOSIS — R05 Cough: Secondary | ICD-10-CM

## 2017-09-21 DIAGNOSIS — R059 Cough, unspecified: Secondary | ICD-10-CM

## 2017-09-21 DIAGNOSIS — R509 Fever, unspecified: Secondary | ICD-10-CM

## 2017-09-21 NOTE — Progress Notes (Signed)
Dg ce

## 2017-09-22 ENCOUNTER — Other Ambulatory Visit (INDEPENDENT_AMBULATORY_CARE_PROVIDER_SITE_OTHER): Payer: Medicare Other

## 2017-09-22 ENCOUNTER — Encounter: Payer: Self-pay | Admitting: Family Medicine

## 2017-09-22 ENCOUNTER — Other Ambulatory Visit: Payer: Self-pay | Admitting: Family Medicine

## 2017-09-22 ENCOUNTER — Ambulatory Visit (HOSPITAL_BASED_OUTPATIENT_CLINIC_OR_DEPARTMENT_OTHER)
Admission: RE | Admit: 2017-09-22 | Discharge: 2017-09-22 | Disposition: A | Discharge status: Home / Self Care | Payer: Medicare Other | Source: Ambulatory Visit | Attending: Family Medicine | Admitting: Family Medicine

## 2017-09-22 DIAGNOSIS — R509 Fever, unspecified: Secondary | ICD-10-CM

## 2017-09-22 DIAGNOSIS — R350 Frequency of micturition: Secondary | ICD-10-CM

## 2017-09-22 DIAGNOSIS — R05 Cough: Secondary | ICD-10-CM | POA: Insufficient documentation

## 2017-09-22 DIAGNOSIS — M542 Cervicalgia: Secondary | ICD-10-CM

## 2017-09-22 DIAGNOSIS — R059 Cough, unspecified: Secondary | ICD-10-CM

## 2017-09-22 LAB — COMPREHENSIVE METABOLIC PANEL
AG Ratio: 1.8 (calc) (ref 1.0–2.5)
ALKALINE PHOSPHATASE (APISO): 53 U/L (ref 40–115)
ALT: 22 U/L (ref 9–46)
AST: 30 U/L (ref 10–35)
Albumin: 4.4 g/dL (ref 3.6–5.1)
BILIRUBIN TOTAL: 0.8 mg/dL (ref 0.2–1.2)
BUN: 22 mg/dL (ref 7–25)
CALCIUM: 9.7 mg/dL (ref 8.6–10.3)
CHLORIDE: 103 mmol/L (ref 98–110)
CO2: 25 mmol/L (ref 20–32)
CREATININE: 0.93 mg/dL (ref 0.70–1.18)
GLOBULIN: 2.5 g/dL (ref 1.9–3.7)
Glucose, Bld: 97 mg/dL (ref 65–99)
Potassium: 4.5 mmol/L (ref 3.5–5.3)
Sodium: 137 mmol/L (ref 135–146)
Total Protein: 6.9 g/dL (ref 6.1–8.1)

## 2017-09-22 LAB — CBC
HEMATOCRIT: 38.9 % (ref 38.5–50.0)
Hemoglobin: 13.5 g/dL (ref 13.2–17.1)
MCH: 29.8 pg (ref 27.0–33.0)
MCHC: 34.7 g/dL (ref 32.0–36.0)
MCV: 85.9 fL (ref 80.0–100.0)
MPV: 10.2 fL (ref 7.5–12.5)
Platelets: 336 10*3/uL (ref 140–400)
RBC: 4.53 10*6/uL (ref 4.20–5.80)
RDW: 12.5 % (ref 11.0–15.0)
WBC: 7.1 10*3/uL (ref 3.8–10.8)

## 2017-09-22 MED ORDER — DOXYCYCLINE HYCLATE 100 MG PO CAPS: 100.0000 mg | ORAL_CAPSULE | Freq: Two times a day (BID) | ORAL | 0 refills | Status: DC

## 2017-09-22 MED ORDER — ALBUTEROL SULFATE HFA 108 (90 BASE) MCG/ACT IN AERS: 2.0000 | INHALATION_SPRAY | Freq: Four times a day (QID) | RESPIRATORY_TRACT | 0 refills | Status: DC | PRN

## 2017-09-22 NOTE — Addendum Note (Signed)
Addended by: Harl Bowie on: 09/22/2017 03:15 PM   Modules accepted: Orders

## 2017-09-27 NOTE — Progress Notes (Signed)
[place] at [facility] [address], [place], [place] [address] [phone]  Date:  09/28/2017   Name:  Jonathan Garrison   DOB:  [birth year]   MRN:  [id number]  PCP:  Darreld Mclean, MD    Chief Complaint: Rash (Went hiking up [place], developed rash on arms and legs, fever, sore throat-taking doxycycline) and Generalized Body Aches (headache-"spasms" neck pain, ear pain)   History of Present Illness:  Jonathan Garrison is a 72 y.o. very pleasant male patient who presents with the following:  Following up today Last seen here last week- 8/28.  At that time he was taking augmentin for possible sinus infection Pt notes that he was on amox for 10 days after his wisdom tooth extraction a month ago  He notes a hoarse voice and ST He is using cough drops and had some left over cough medicine that he used last night- however it did not really help and he was still up coughing during the night  He did take tylenol this am- just one He is also taking meloxicam for joint pains on a routine basis No belly pain, no vomiting No fever today He does feel that he is overall a bit better but wanted to make sure there is not any other concern at this time He did go swimming in a river not long ago and wonders if this might be why he is sick Confirmed that temp from his first email yesterday- 36- was a typo, he meant 100.3  I changed his abx to doxy over the weekend as he continued to notice low grade fevers -concern for possible tick illness He sent the following mychart message yesterday-  follow-up appt. tomorrow, Thurs., 2:30 PM to check if i am healing.  on Doxycycline with 4 or 5 days to go.  questions:  * i see many patients take 14 days to a month of Doxy to knock out tick illnesses. would you recommend another 10 days Rx? or less or more?  * would it be worth get the tick disease panels and find out more precisely what i have? my infection  could have started over a month ago in [place] after a hike... i had a bunch of rash then.  current situation:  * temp. seems moderating: last night up to 99.7.Marland KitchenMarland Kitchen to day normal.  * bad coughing last night, tamed by Hydromet for sleep... today the cough is moderate.  * still have ragged voice... i strain to speak but can do it, exhaustingly... run out of breath.  * earaches, neckaches, and headaches last night... significantly better now during the day.  * some nausea... lots of naps yesterday.  thoughts? see you Thurs.   He sent another mychart message today also: embarrassed to keep writing... but continue to be concerned.   rough night last night:   * neck/headache with occasional ear pain would not go away while i tried to sleep. there's a kind of dull pain semi-continuous then accompanied by spasms of pain. same this morning. went back to bed.   * small temp. at 9 AM = 99.2.Marland KitchenMarland Kitchen moderated by 11 AM = 98.8   * constant mixture of hunger and slight nausea... hard to eat a full meal   * bad coughing last night... moderate during "sleep" and this morning   * i've been noticing a little dizziness and some loss of balance... no falls, staying alert   * now  up... had Tylenol, some vits., yogurt, coffee... and the Doxycycline... feeling a little better   * i'd like to renew the cough medecine (not abusing) plus i think some others    So far we have done a CXR, CMP and CBC which were all benign  At this time he is most bothered by HA and cough He will notice occasional "twinges" in his ears that last for a moment or so He may have some ache in the back of his neck as well- feels like a muscle spasm His voice is also hoarse but getting better   No vomiting or diarrhea He is a bit nauseated but we think this is due to medication He has about 3 days of doxy left He is using some hydromet at bedtime only and this does help - I gave him just a small bottle at last visit and he wonders about getting a  refill  Reviewed [place] and all looks ok   He was up [place] over the summer so he could have been exposed to lyme   Dg Chest 2 View  Result Date: 09/22/2017 CLINICAL DATA:  Dyspnea, fever and cough. EXAM: CHEST - 2 VIEW COMPARISON:  11/10/2016 FINDINGS: The heart size and mediastinal contours are within normal limits. Nonaneurysmal minimally atherosclerotic aorta. Both lungs are clear. Mild degenerative change along the thoracic spine. IMPRESSION: No active cardiopulmonary disease. Electronically Signed   By: Ashley Royalty M.D.   On: 09/22/2017 15:07    Patient Active Problem List   Diagnosis Date Noted  . Osteopenia 08/01/2017  . Vitamin B12 deficiency anemia 06/09/2014  . Chest pain 03/13/2013  . Depression with anxiety 08/26/2011  . Overweight (BMI 25.0-29.9) 08/26/2011  . Joint pain 08/26/2011  . 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 Oral cancer ([place]) 07/09/2010    Past Medical History:  Diagnosis Date  . ADD (attention deficit disorder) without hyperactivity   . Allergy   . Anxiety   . Arthralgia    many joints, managed with vitamins/herbs and ibuprofen  . Chest pain   . Colitis 1986   single attack (believes was ulcerative)  . Depression   . Eczema   . Esophageal stricture   . GERD (gastroesophageal reflux disease)   . Hemorrhoids   . Lichen planus   . Neuromuscular disorder ([place])    POLIO AGE 57  . Oral cancer ([place]) 05/2010   plans for surgery 06/2010  . Pleurisy 1984  . Polio age 40  . Umbilical hernia     Past Surgical History:  Procedure Laterality Date  . COLONOSCOPY    . LEG SURGERY  when in 6th grade   L leg, bone spur removal (just above knee, laterally)  . TONGUE SURGERY     for tongue cancer    Social History   Tobacco Use  . Smoking status: Former Smoker    Types: Pipe, Cigarettes    Last attempt to quit: 01/25/1976    Years since quitting: 41.7  . Smokeless tobacco: Never Used  . Tobacco comment: Quit smoking 35 years ago  Substance  Use Topics  . Alcohol use: Yes    Alcohol/week: 7.0 - 14.0 standard drinks    Types: 7 - 14 Glasses of wine per week  . Drug use: No    Family History  Problem Relation Age of Onset  . Hypertension Mother   . Diabetes Mother   . Hyperlipidemia Mother   . Hypertension Father   . Benign prostatic  hyperplasia Father   . Heart disease Father   . Lichen planus Sister   . Cancer Sister        ?spot on chest? contained  . Diabetes Maternal Grandfather   . Colon cancer Neg Hx   . Esophageal cancer Neg Hx   . Stomach cancer Neg Hx   . Pancreatic cancer Neg Hx   . Liver disease Neg Hx     Allergies  Allergen Reactions  . Sulfa Antibiotics Other (See Comments)    unknown    Medication list has been reviewed and updated.  Current Outpatient Medications on File Prior to Visit  Medication Sig Dispense Refill  . albuterol (PROVENTIL HFA;VENTOLIN HFA) 108 (90 Base) MCG/ACT inhaler Inhale 2 puffs into the lungs every 6 (six) hours as needed for wheezing or shortness of breath. 1 Inhaler 0  . amphetamine-dextroamphetamine (ADDERALL) 10 MG tablet Take 1 tablet (10 mg total) by mouth 2 (two) times daily. As needed for inattention 60 tablet 0  . aspirin 81 MG tablet Take 81 mg by mouth daily.    Marland Kitchen b complex vitamins tablet Take 0.5 tablets by mouth daily.      . Biotin 5000 MCG CAPS Take 1 capsule by mouth 2 (two) times daily.    . Calcium-Magnesium-Zinc 167-83-8 MG TABS Take by mouth. 1/2 tablet daily    . chlorpheniramine (CHLOR-TRIMETON) 4 MG tablet Take 2 mg by mouth every 6 (six) hours as needed.     . Cholecalciferol (VITAMIN D3) 2000 UNITS capsule Take 2,000 Units by mouth daily.    Marland Kitchen doxycycline (VIBRAMYCIN) 100 MG capsule Take 1 capsule (100 mg total) by mouth 2 (two) times daily. 20 capsule 0  . fluticasone (FLONASE) 50 MCG/ACT nasal spray PLACE 2 SPRAYS INTO BOTH NOSTRILS DAILY 16 g 11  . glucosamine-chondroitin 500-400 MG tablet Take 1 tablet by mouth 2 (two) times daily.      .  meloxicam (MOBIC) 15 MG tablet TAKE 1 TABLET BY MOUTH EVERY DAY AS NEEDED 90 tablet 1  . Multiple Vitamins-Minerals (MULTIVITAMIN WITH MINERALS) tablet Take 1 tablet by mouth daily.      . NON FORMULARY ALJ -herbal decongestant  Takes as needed    . omeprazole (PRILOSEC) 40 MG capsule Take one capsule by mouth 30 minutes before a meal. 90 capsule 3  . sildenafil (REVATIO) 20 MG tablet Take 2-3 tablets one hour prior to intercourse 60 tablet 5  . Zn-Pyg Afri-Nettle-Saw Palmet (SAW PALMETTO COMPLEX PO) Take 1 tablet by mouth 2 (two) times daily.     No current facility-administered medications on file prior to visit.     Review of Systems:  As per HPI- otherwise negative. BP Readings from Last 3 Encounters:  09/28/17 (!) 142/98  09/20/17 (!) 146/80  09/15/17 135/81      Physical Examination: Vitals:   09/28/17 1438  BP: (!) 142/98  Pulse: 98  Resp: 16  Temp: 98.1 F (36.7 C)  SpO2: 98%   Vitals:   09/28/17 1438  Weight: 189 lb (85.7 kg)  Height: '6\' 2"'  (1.88 m)   Body mass index is 24.27 kg/m. Ideal Body Weight: Weight in (lb) to have BMI = 25: 194.3  GEN: WDWN, NAD, Non-toxic, A & O x 3, looks well, normal weight  HEENT: Atraumatic, Normocephalic. Neck supple. No masses, No LAD.  Bilateral TM wnl, oropharynx normal.  PEERL,EOMI.   His voice is moderately hoarse  Ears and Nose: No external deformity. CV: RRR, No M/G/R. No JVD.  No thrill. No extra heart sounds. PULM: CTA B, no wheezes, crackles, rhonchi. No retractions. No resp. distress. No accessory muscle use. ABD: S, NT, ND. No rebound. No HSM. EXTR: No c/c/e NEURO Normal gait.  PSYCH: Normally interactive. Conversant. Not depressed or anxious appearing.  Calm demeanor.    Assessment and Plan: Tick bite, initial encounter - Plan: doxycycline (VIBRAMYCIN) 100 MG capsule, B. burgdorfi antibodies, Ehrlichia antibody panel  Cough - Plan: HYDROcodone-homatropine (HYCODAN) 5-1.5 MG/5ML syrup, predniSONE (DELTASONE)  20 MG tablet  Low grade fever - Plan: Sedimentation rate, C-reactive protein  Neck pain - Plan: meloxicam (MOBIC) 15 MG tablet  Here today for a 3rd visit for illness He has been treated with Augmentin and then changed to doxycycline mostly due to concern about possible tick borne illness.  Labs and CXR so far reassuring.  [name] tends to be very attuned to any symptoms and has some health related anxiety - I think he is overall getting better but it is more difficult to determine this Will refill his hycodan so he can sleep Run tick born illness titers as above Check ESR and sed rate to help rule out autoimmune disorder as cause of his persistent low grade temps Few days of prednisone in hopes of relieving his cough and hoarse voice.   Meds ordered this encounter  Medications  . doxycycline (VIBRAMYCIN) 100 MG capsule    Sig: Take 1 capsule (100 mg total) by mouth 2 (two) times daily.    Dispense:  40 capsule    Refill:  0  . HYDROcodone-homatropine (HYCODAN) 5-1.5 MG/5ML syrup    Sig: Take 5 mLs by mouth every 8 (eight) hours as needed for up to 5 days for cough.    Dispense:  75 mL    Refill:  0  . predniSONE (DELTASONE) 20 MG tablet    Sig: Take 2 pills a day for 3 days, then 1 pill a day for 3 days    Dispense:  9 tablet    Refill:  0  . meloxicam (MOBIC) 15 MG tablet    Sig: Take 1 tablet (15 mg total) by mouth daily as needed.    Dispense:  90 tablet    Refill:  3   Follow-up with results and any change or worsening of his sx   Signed Lamar Blinks, MD

## 2017-09-28 ENCOUNTER — Ambulatory Visit (INDEPENDENT_AMBULATORY_CARE_PROVIDER_SITE_OTHER): Payer: Medicare Other | Admitting: Family Medicine

## 2017-09-28 ENCOUNTER — Encounter: Payer: Self-pay | Admitting: Family Medicine

## 2017-09-28 VITALS — BP 142/98 | HR 98 | Temp 98.1°F | Resp 16 | Ht 74.0 in | Wt 189.0 lb

## 2017-09-28 DIAGNOSIS — I6523 Occlusion and stenosis of bilateral carotid arteries: Secondary | ICD-10-CM

## 2017-09-28 DIAGNOSIS — M542 Cervicalgia: Secondary | ICD-10-CM

## 2017-09-28 DIAGNOSIS — S40862D Insect bite (nonvenomous) of left upper arm, subsequent encounter: Secondary | ICD-10-CM | POA: Diagnosis not present

## 2017-09-28 DIAGNOSIS — S70361D Insect bite (nonvenomous), right thigh, subsequent encounter: Secondary | ICD-10-CM | POA: Diagnosis not present

## 2017-09-28 DIAGNOSIS — S40861D Insect bite (nonvenomous) of right upper arm, subsequent encounter: Secondary | ICD-10-CM

## 2017-09-28 DIAGNOSIS — W57XXXA Bitten or stung by nonvenomous insect and other nonvenomous arthropods, initial encounter: Secondary | ICD-10-CM | POA: Diagnosis not present

## 2017-09-28 DIAGNOSIS — R059 Cough, unspecified: Secondary | ICD-10-CM

## 2017-09-28 DIAGNOSIS — S70362D Insect bite (nonvenomous), left thigh, subsequent encounter: Secondary | ICD-10-CM

## 2017-09-28 DIAGNOSIS — R05 Cough: Secondary | ICD-10-CM

## 2017-09-28 DIAGNOSIS — R509 Fever, unspecified: Secondary | ICD-10-CM | POA: Diagnosis not present

## 2017-09-28 DIAGNOSIS — Z9189 Other specified personal risk factors, not elsewhere classified: Secondary | ICD-10-CM | POA: Diagnosis not present

## 2017-09-28 MED ORDER — DOXYCYCLINE HYCLATE 100 MG PO CAPS: 100.0000 mg | ORAL_CAPSULE | Freq: Two times a day (BID) | ORAL | 0 refills | Status: DC

## 2017-09-28 MED ORDER — HYDROCODONE-HOMATROPINE 5-1.5 MG/5ML PO SYRP: 5.0000 mL | ORAL_SOLUTION | Freq: Three times a day (TID) | ORAL | 0 refills | Status: AC | PRN

## 2017-09-28 MED ORDER — MELOXICAM 15 MG PO TABS: 15.0000 mg | ORAL_TABLET | Freq: Every day | ORAL | 3 refills | Status: DC | PRN

## 2017-09-28 MED ORDER — PREDNISONE 20 MG PO TABS: ORAL_TABLET | ORAL | 0 refills | Status: DC

## 2017-09-28 NOTE — Patient Instructions (Addendum)
I refilled your cough syrup for you to use as needed Also gave you another 20 days of doxycycline that we can use IF needed- however if you want to wait on picking this up until we get your labs back that is fine!  Let's also try a few days of prednisone for your cough and laryngitis I will do a sed rate and CRP as well to look for any suggestion of an autoimmune disorder  Avoid Mobic or other NSAID drugs while you are on prednisone

## 2017-09-29 LAB — C-REACTIVE PROTEIN

## 2017-09-29 LAB — SEDIMENTATION RATE: SED RATE: 17 mm/h (ref 0–20)

## 2017-09-30 LAB — EHRLICHIA ANTIBODY PANEL: E. CHAFFEENSIS AB IGG: <1:64 {titer}

## 2017-09-30 LAB — B. BURGDORFI ANTIBODIES: B burgdorferi Ab IgG+IgM: <0.9 index

## 2017-10-04 ENCOUNTER — Encounter: Payer: Self-pay | Admitting: Family Medicine

## 2017-10-04 ENCOUNTER — Ambulatory Visit (INDEPENDENT_AMBULATORY_CARE_PROVIDER_SITE_OTHER): Payer: Medicare Other | Admitting: Family Medicine

## 2017-10-04 VITALS — BP 144/82 | HR 96 | Temp 98.5°F | Resp 16 | Ht 74.0 in | Wt 189.0 lb

## 2017-10-04 DIAGNOSIS — I6523 Occlusion and stenosis of bilateral carotid arteries: Secondary | ICD-10-CM | POA: Diagnosis not present

## 2017-10-04 DIAGNOSIS — R350 Frequency of micturition: Secondary | ICD-10-CM | POA: Diagnosis not present

## 2017-10-04 DIAGNOSIS — R05 Cough: Secondary | ICD-10-CM

## 2017-10-04 DIAGNOSIS — R059 Cough, unspecified: Secondary | ICD-10-CM

## 2017-10-04 LAB — POCT URINALYSIS DIP (MANUAL ENTRY)
Bilirubin, UA: NEGATIVE
Blood, UA: NEGATIVE
Glucose, UA: NEGATIVE mg/dL
Ketones, POC UA: NEGATIVE mg/dL
LEUKOCYTES UA: NEGATIVE
NITRITE UA: NEGATIVE
PH UA: 6 (ref 5.0–8.0)
PROTEIN UA: NEGATIVE mg/dL
Spec Grav, UA: 1.025 (ref 1.010–1.025)
Urobilinogen, UA: 0.2 E.U./dL

## 2017-10-04 NOTE — Progress Notes (Signed)
[facility] [place], [place], [place] [address] [phone]  Date:  10/04/2017   Name:  Jonathan Garrison   DOB:  14-Apr-1945   MRN:  [id number]  PCP:  Darreld Mclean, MD    Chief Complaint: Urinary Tract Infection (frequency, ularged prostate?, normal temp) and Cough (Stopped hydromet-due to frequent urination, stopped doxycycline)   History of Present Illness:  Jonathan Garrison is a 72 y.o. very pleasant male patient who presents with the following:  Seen several times recently for illness- likely a viral infection of some time However he is not concerned about a UTI, had sent the following mychart messages to me recently: From this am: will be in early - before our 11:15 appt. - to give urine sample.  i'm off Doxycycline of course and temp. has been normal.  i've still been taking the Hydromet because i still cough -  but i'm stopping it tonight because it can cause painful urination.  * i wonder what continues to cause the coughing/shortness of breath.  i'm keeping the appt. because of the frequent, uncomfortable, very light urination.  * i'm wondering about a poss. UTI and/or enlarged Prostate and/or ureter issues.  * we're doing the Urinalysis.Marland Kitchen. i wonder if we should do a digital Prostate exam at least.  overall doing better but still not tip-top. :)   From a day or so ago:  with the 2 tick tests negative, and with everything else we've done,   can we rule out a Bacterial infection at this point ?   * if so should i stop the Doxycycline ? (just finished 10 days)   * OR should i pick up some of the fresh Rx ? and how much ?  * cough is almost better, temp. down...   final Issue: multiple small uncomfortable urinations.  * in the end, does all our testing indicate just a Virus ?!  thank you for all your support and help !!   Lab Results  Component Value Date   PSA 1.35 07/13/2017   PSA 1.06  06/30/2016   PSA 1.26 12/09/2015   He notes that his temps have been normal for several days  We did treat him with 10 days of doxycyline however due to cough and low grade fevers- concern for tick illness He is still coughing some but this seems to be getting better He also notes urinary frequency perhaps for months/ a year or so, but would come and go He feels like this has gotten worse however and he also noted mild urinary discomfort recently  He has noted these worsening sx over the last 2 weeks or so No blood in his urine   He has stopped the hycodan cough syrup which is good Patient Active Problem List   Diagnosis Date Noted  . Osteopenia 08/01/2017  . Vitamin B12 deficiency anemia 06/09/2014  . Chest pain 03/13/2013  . Depression with anxiety 08/26/2011  . Overweight (BMI 25.0-29.9) 08/26/2011  . Joint pain 08/26/2011  . 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 ADD (attention deficit disorder) without hyperactivity   . Allergy   . Anxiety   . Arthralgia    many joints, managed with vitamins/herbs and ibuprofen  . Chest pain   . Colitis 1986   single attack (believes was ulcerative)  . Depression   .  Eczema   . Esophageal stricture   . GERD (gastroesophageal reflux disease)   . Hemorrhoids   . Lichen planus   . Neuromuscular disorder ([place])    POLIO AGE 58  . 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 Pleurisy 1984  . 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 Umbilical hernia     Past Surgical History:  Procedure Laterality Date  . COLONOSCOPY    . LEG SURGERY  when in 6th grade   L leg, bone spur removal (just above knee, laterally)  . TONGUE SURGERY     for tongue cancer    Social History   Tobacco Use  . Smoking status: Former Smoker    Types: Pipe, Cigarettes    Last attempt to quit: 01/25/1976    Years since quitting: 41.7  . Smokeless tobacco: Never Used  . Tobacco comment: Quit  smoking 35 years ago  Substance Use Topics  . Alcohol use: Yes    Alcohol/week: 7.0 - 14.0 standard drinks    Types: 7 - 14 Glasses of wine per week  . Drug use: No    Family History  Problem Relation Age of Onset  . Hypertension Mother   . Diabetes Mother   . Hyperlipidemia Mother   . Hypertension Father   . Benign prostatic hyperplasia Father   . Heart disease Father   . Lichen planus Sister   . Cancer Sister        ?spot on chest? contained  . Diabetes Maternal Grandfather   . Colon cancer Neg Hx   . Esophageal cancer Neg Hx   . Stomach cancer Neg Hx   . Pancreatic cancer Neg Hx   . Liver disease Neg Hx     Allergies  Allergen Reactions  . Sulfa Antibiotics Other (See Comments)    unknown    Medication list has been reviewed and updated.  Current Outpatient Medications on File Prior to Visit  Medication Sig Dispense Refill  . albuterol (PROVENTIL HFA;VENTOLIN HFA) 108 (90 Base) MCG/ACT inhaler Inhale 2 puffs into the lungs every 6 (six) hours as needed for wheezing or shortness of breath. 1 Inhaler 0  . amphetamine-dextroamphetamine (ADDERALL) 10 MG tablet Take 1 tablet (10 mg total) by mouth 2 (two) times daily. As needed for inattention 60 tablet 0  . aspirin 81 MG tablet Take 81 mg by mouth daily.    Marland Kitchen b complex vitamins tablet Take 0.5 tablets by mouth daily.      . Biotin 5000 MCG CAPS Take 1 capsule by mouth 2 (two) times daily.    . Calcium-Magnesium-Zinc 167-83-8 MG TABS Take by mouth. 1/2 tablet daily    . chlorpheniramine (CHLOR-TRIMETON) 4 MG tablet Take 2 mg by mouth every 6 (six) hours as needed.     . Cholecalciferol (VITAMIN D3) 2000 UNITS capsule Take 2,000 Units by mouth daily.    . fluticasone (FLONASE) 50 MCG/ACT nasal spray PLACE 2 SPRAYS INTO BOTH NOSTRILS DAILY 16 g 11  . glucosamine-chondroitin 500-400 MG tablet Take 1 tablet by mouth 2 (two) times daily.      . meloxicam (MOBIC) 15 MG tablet Take 1 tablet (15 mg total) by mouth daily as  needed. 90 tablet 3  . Multiple Vitamins-Minerals (MULTIVITAMIN WITH MINERALS) tablet Take 1 tablet by mouth daily.      . NON FORMULARY ALJ -herbal decongestant  Takes as needed    . omeprazole (PRILOSEC) 40 MG capsule Take one capsule by mouth 30  minutes before a meal. 90 capsule 3  . predniSONE (DELTASONE) 20 MG tablet Take 2 pills a day for 3 days, then 1 pill a day for 3 days 9 tablet 0  . sildenafil (REVATIO) 20 MG tablet Take 2-3 tablets one hour prior to intercourse 60 tablet 5  . Zn-Pyg Afri-Nettle-Saw Palmet (SAW PALMETTO COMPLEX PO) Take 1 tablet by mouth 2 (two) times daily.     No current facility-administered medications on file prior to visit.     Review of Systems:  As per HPI- otherwise negative. Pulse Readings from Last 3 Encounters:  10/04/17 96  09/28/17 98  09/20/17 96   No current fevers or chills   Physical Examination: Vitals:   10/04/17 1119  BP: (!) 144/82  Pulse: 96  Resp: 16  Temp: 98.5 F (36.9 C)  SpO2: 97%   Vitals:   10/04/17 1119  Weight: 189 lb (85.7 kg)  Height: 6\' 2"  (1.88 m)   Body mass index is 24.27 kg/m. Ideal Body Weight: Weight in (lb) to have BMI = 25: 194.3  GEN: WDWN, NAD, Non-toxic, A & O x 3, looks well, normal weight  HEENT: Atraumatic, Normocephalic. Neck supple. No masses, No LAD.  Bilateral TM wnl, oropharynx normal.  PEERL,EOMI.   Ears and Nose: No external deformity. CV: RRR, No M/G/R. No JVD. No thrill. No extra heart sounds. PULM: CTA B, no wheezes, crackles, rhonchi. No retractions. No resp. distress. No accessory muscle use. ABD: S, NT, ND, +BS. No rebound. No HSM. EXTR: No c/c/e NEURO Normal gait.  PSYCH: Normally interactive. Conversant. Not depressed or anxious appearing.  Calm demeanor.  GU; normal male genitalia, no hernia Prostate is smooth and non- tender   Results for orders placed or performed in visit on 10/04/17  POCT urinalysis dipstick  Result Value Ref Range   Color, UA yellow yellow    Clarity, UA clear clear   Glucose, UA negative negative mg/dL   Bilirubin, UA negative negative   Ketones, POC UA negative negative mg/dL   Spec Grav, UA 1.025 1.010 - 1.025   Blood, UA negative negative   pH, UA 6.0 5.0 - 8.0   Protein Ur, POC negative negative mg/dL   Urobilinogen, UA 0.2 0.2 or 1.0 E.U./dL   Nitrite, UA Negative Negative   Leukocytes, UA Negative Negative    Assessment and Plan: Urinary frequency - Plan: Urine Culture, POCT urinalysis dipstick  Cough  Seen several times since 8/23 with a ST, then likely viral syndrome and  Now with urinary complaint  UA is normal Await urine culture Discussed flomax, he does not wish to start any other meds right now No sign of prostatitis on physical exam   Signed Lamar Blinks, MD

## 2017-10-04 NOTE — Patient Instructions (Addendum)
Good to see you today!  I will be in touch with your urine culture otc mucinex may be helpful for getting any mucus out of your chest Let me know if you have any other concerns but I do feel like you are getting better- good news!

## 2017-10-05 LAB — URINE CULTURE
MICRO NUMBER:: 91087812
RESULT: NO GROWTH
SPECIMEN QUALITY: ADEQUATE

## 2017-10-11 ENCOUNTER — Other Ambulatory Visit: Payer: Self-pay | Admitting: Family Medicine

## 2017-10-11 DIAGNOSIS — R350 Frequency of micturition: Secondary | ICD-10-CM

## 2017-10-18 ENCOUNTER — Encounter: Payer: Self-pay | Admitting: *Deleted

## 2017-11-10 ENCOUNTER — Encounter: Payer: Self-pay | Admitting: Family Medicine

## 2017-11-10 DIAGNOSIS — R1310 Dysphagia, unspecified: Secondary | ICD-10-CM

## 2017-11-17 DIAGNOSIS — R351 Nocturia: Secondary | ICD-10-CM | POA: Diagnosis not present

## 2017-11-17 DIAGNOSIS — N401 Enlarged prostate with lower urinary tract symptoms: Secondary | ICD-10-CM | POA: Diagnosis not present

## 2017-11-17 DIAGNOSIS — N5201 Erectile dysfunction due to arterial insufficiency: Secondary | ICD-10-CM | POA: Diagnosis not present

## 2017-11-17 DIAGNOSIS — R972 Elevated prostate specific antigen [PSA]: Secondary | ICD-10-CM | POA: Diagnosis not present

## 2017-11-27 ENCOUNTER — Encounter: Payer: Self-pay | Admitting: Family Medicine

## 2017-11-27 DIAGNOSIS — R972 Elevated prostate specific antigen [PSA]: Secondary | ICD-10-CM | POA: Insufficient documentation

## 2017-12-05 DIAGNOSIS — L570 Actinic keratosis: Secondary | ICD-10-CM | POA: Diagnosis not present

## 2017-12-05 DIAGNOSIS — D229 Melanocytic nevi, unspecified: Secondary | ICD-10-CM | POA: Diagnosis not present

## 2017-12-05 DIAGNOSIS — D485 Neoplasm of uncertain behavior of skin: Secondary | ICD-10-CM | POA: Diagnosis not present

## 2017-12-05 DIAGNOSIS — B078 Other viral warts: Secondary | ICD-10-CM | POA: Diagnosis not present

## 2017-12-15 DIAGNOSIS — Z85819 Personal history of malignant neoplasm of unspecified site of lip, oral cavity, and pharynx: Secondary | ICD-10-CM | POA: Diagnosis not present

## 2017-12-15 DIAGNOSIS — R1310 Dysphagia, unspecified: Secondary | ICD-10-CM | POA: Diagnosis not present

## 2017-12-15 DIAGNOSIS — Z8739 Personal history of other diseases of the musculoskeletal system and connective tissue: Secondary | ICD-10-CM | POA: Diagnosis not present

## 2017-12-15 DIAGNOSIS — Z7289 Other problems related to lifestyle: Secondary | ICD-10-CM | POA: Diagnosis not present

## 2017-12-15 DIAGNOSIS — K219 Gastro-esophageal reflux disease without esophagitis: Secondary | ICD-10-CM | POA: Insufficient documentation

## 2017-12-18 ENCOUNTER — Encounter: Payer: Self-pay | Admitting: Family Medicine

## 2017-12-18 ENCOUNTER — Other Ambulatory Visit: Payer: Self-pay | Admitting: Family Medicine

## 2017-12-18 DIAGNOSIS — F988 Other specified behavioral and emotional disorders with onset usually occurring in childhood and adolescence: Secondary | ICD-10-CM

## 2017-12-19 MED ORDER — AMPHETAMINE-DEXTROAMPHETAMINE 10 MG PO TABS: 10.0000 mg | ORAL_TABLET | Freq: Two times a day (BID) | ORAL | 0 refills | Status: DC

## 2017-12-19 NOTE — Telephone Encounter (Signed)
Reviewed [place]- ok to refill.  He uses prn

## 2017-12-28 ENCOUNTER — Encounter: Payer: Self-pay | Admitting: Family Medicine

## 2017-12-29 ENCOUNTER — Ambulatory Visit (INDEPENDENT_AMBULATORY_CARE_PROVIDER_SITE_OTHER): Payer: Medicare Other

## 2017-12-29 DIAGNOSIS — Z23 Encounter for immunization: Secondary | ICD-10-CM

## 2018-01-30 ENCOUNTER — Ambulatory Visit (INDEPENDENT_AMBULATORY_CARE_PROVIDER_SITE_OTHER): Payer: Medicare Other | Admitting: Internal Medicine

## 2018-01-30 ENCOUNTER — Encounter: Payer: Self-pay | Admitting: Internal Medicine

## 2018-01-30 VITALS — BP 142/90 | HR 84 | Ht 74.0 in | Wt 198.0 lb

## 2018-01-30 DIAGNOSIS — K222 Esophageal obstruction: Secondary | ICD-10-CM

## 2018-01-30 DIAGNOSIS — R1310 Dysphagia, unspecified: Secondary | ICD-10-CM

## 2018-01-30 DIAGNOSIS — K219 Gastro-esophageal reflux disease without esophagitis: Secondary | ICD-10-CM

## 2018-01-30 DIAGNOSIS — R490 Dysphonia: Secondary | ICD-10-CM | POA: Diagnosis not present

## 2018-01-30 MED ORDER — OMEPRAZOLE 40 MG PO CPDR: 40.0000 mg | DELAYED_RELEASE_CAPSULE | Freq: Two times a day (BID) | ORAL | 3 refills | Status: DC

## 2018-01-30 NOTE — Patient Instructions (Signed)
We have sent the following medications to your pharmacy for you to pick up at your convenience:  Omeprazole  You have been scheduled for an endoscopy. Please follow written instructions given to you at your visit today. If you use inhalers (even only as needed), please bring them with you on the day of your procedure. Your physician has requested that you go to www.startemmi.com and enter the access code given to you at your visit today. This web site gives a general overview about your procedure. However, you should still follow specific instructions given to you by our office regarding your preparation for the procedure.   

## 2018-01-30 NOTE — Progress Notes (Signed)
HISTORY OF PRESENT ILLNESS:  Jonathan Garrison is a 73 y.o. male with GERD complicated by severe erosive esophagitis and peptic stricture requiring esophageal dilation who presents today regarding management of his GERD and concerns as described below.  He was last evaluated April 25, 2017 at which time he was doing well on omeprazole 40 mg daily.  Routine follow-up in 1 year recommended.  The patient subsequently saw Dr. Wilburn Cornelia in November regarding problems with cough and hoarseness (that office note and laryngoscopy results reviewed).  Laryngoscopy was essentially unremarkable except for some post glottic erythema which was felt secondary to GERD.  The recommendation was to take his PPI at 5 AM as opposed to 30 minutes before breakfast as he had been doing.  Patient contacted this office with questions.  This appointment arranged.  He tells me that his problems with hoarseness and cough have not changed.  He tells me that his ENT was reluctant to place him on twice daily PPI.  He tells me that his intermittent solid food and pill dysphasia has worsened since his last office evaluation.  He does have occasional breakthrough reflux symptoms for which she will take an extra dose of PPI.  No weight loss.  He continues to be monitored and treated for low-grade tongue cancer.  Last upper endoscopy July 2015.  Balloon dilation at that time to a maximal diameter of 20 mm.  He is screening colonoscopy is up-to-date with his last exam 2012 being negative.  GI review of systems is otherwise negative.  Review of outside laboratories from August 2019 finds unremarkable comprehensive metabolic panel.  Normal CBC with hemoglobin 13.5.  Outside x-ray review find CT scan of the abdomen and pelvis June 2017 unremarkable  REVIEW OF SYSTEMS:  All non-GI ROS negative unless otherwise stated in the HPI except for allergies, anxiety, arthritis, back pain,  cough, visual change, hearing problems, nosebleeds, voice change, excessive  urination  Past Medical History:  Diagnosis Date  . ADD (attention deficit disorder) without hyperactivity   . Allergy   . Anxiety   . Arthralgia    many joints, managed with vitamins/herbs and ibuprofen  . Chest pain   . Colitis 1986   single attack (believes was ulcerative)  . Depression   . Eczema   . Esophageal stricture   . GERD (gastroesophageal reflux disease)   . Hemorrhoids   . Lichen planus   . Neuromuscular disorder ([place])    POLIO AGE 63  . 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 Pleurisy 1984  . Polio age 52  . Umbilical hernia     Past Surgical History:  Procedure Laterality Date  . COLONOSCOPY    . LEG SURGERY  when in 6th grade   L leg, bone spur removal (just above knee, laterally)  . TONGUE SURGERY     for tongue cancer    Social History Konner Saiz  reports that he quit smoking about 42 years ago. His smoking use included pipe and cigarettes. He has never used smokeless tobacco. He reports current alcohol use of about 7.0 - 14.0 standard drinks of alcohol per week. He reports that he does not use drugs.  family history includes Benign prostatic hyperplasia in his father; Cancer in his sister; Diabetes in his maternal grandfather and mother; Heart disease in his father; Hyperlipidemia in his mother; Hypertension in his father and mother; Lichen planus in his sister.  Allergies  Allergen Reactions  . Sulfa Antibiotics Other (  See Comments)    unknown       PHYSICAL EXAMINATION: Vital signs: BP (!) 142/90   Pulse 84   Ht 6\' 2"  (1.88 m)   Wt 198 lb (89.8 kg)   BMI 25.42 kg/m   Constitutional: generally well-appearing, no acute distress Psychiatric: alert and oriented x3, cooperative Eyes: extraocular movements intact, anicteric, conjunctiva pink Mouth: oral pharynx moist, no lesions Neck: supple no lymphadenopathy Cardiovascular: heart regular rate and rhythm, no murmur Lungs: clear to auscultation bilaterally Abdomen: soft,  nontender, nondistended, no obvious ascites, no peritoneal signs, normal bowel sounds, no organomegaly Rectal: Omitted Extremities: no clubbing, cyanosis, or lower extremity edema bilaterally Skin: no lesions on visible extremities Neuro: No focal deficits.  Cranial nerves intact  ASSESSMENT:  1.  GERD complicated by erosive esophagitis and peptic stricture.  Currently with breakthrough symptoms as well as recurrent dysphasia. 2.  Pharyngeal symptoms including cough and hoarseness.  Possibly related to GERD 3.  Colon cancer screening.  Up-to-date   PLAN:  1.  Reflux precautions 2.  Increase omeprazole to 40 mg twice daily.  Prescribed. 3.  Discussed current knowledge base with regards to potential long-term side effects of chronic PPI use. 4.  Schedule upper endoscopy with esophageal dilation.The nature of the procedure, as well as the risks, benefits, and alternatives were carefully and thoroughly reviewed with the patient. Ample time for discussion and questions allowed. The patient understood, was satisfied, and agreed to proceed. 5.  Routine screening colonoscopy around 2022 6.  Office follow-up after endoscopy in about 3 to 4 months to assess response to therapy and adjusted dose of PPI  A copy of this note has been sent to Dr. Wilburn Cornelia and Dr. Lorelei Pont

## 2018-02-13 ENCOUNTER — Encounter: Payer: Medicare Other | Admitting: Internal Medicine

## 2018-02-13 ENCOUNTER — Ambulatory Visit (AMBULATORY_SURGERY_CENTER): Payer: Medicare Other | Admitting: Internal Medicine

## 2018-02-13 ENCOUNTER — Encounter: Payer: Self-pay | Admitting: Internal Medicine

## 2018-02-13 VITALS — BP 127/82 | HR 86 | Temp 96.9°F | Resp 16 | Ht 74.0 in | Wt 198.0 lb

## 2018-02-13 DIAGNOSIS — R1310 Dysphagia, unspecified: Secondary | ICD-10-CM | POA: Diagnosis not present

## 2018-02-13 DIAGNOSIS — K219 Gastro-esophageal reflux disease without esophagitis: Secondary | ICD-10-CM | POA: Diagnosis not present

## 2018-02-13 DIAGNOSIS — K2970 Gastritis, unspecified, without bleeding: Secondary | ICD-10-CM

## 2018-02-13 DIAGNOSIS — K2990 Gastroduodenitis, unspecified, without bleeding: Secondary | ICD-10-CM

## 2018-02-13 DIAGNOSIS — K222 Esophageal obstruction: Secondary | ICD-10-CM

## 2018-02-13 MED ORDER — SODIUM CHLORIDE 0.9 % IV SOLN: 500.0000 mL | Freq: Once | INTRAVENOUS | Status: DC

## 2018-02-13 NOTE — Progress Notes (Signed)
Called to room to assist during endoscopic procedure.  Patient ID and intended procedure confirmed with present staff. Received instructions for my participation in the procedure from the performing physician.  

## 2018-02-13 NOTE — Progress Notes (Signed)
Alert and oriented x3, pleased with MAC, report to RN  

## 2018-02-13 NOTE — Patient Instructions (Signed)
Thank you for allowing [place] to care for you today.  Await biopsy results, approximately 2 weeks.  Recommend follow up Endoscopy with Dr Henrene Pastor in one year.  Sooner if needed.  Resume previous medications today.  Follow post-dilation diet today.  Return to normal activities tomorrow.   YOU HAD AN ENDOSCOPIC PROCEDURE TODAY AT [facility]:   Refer to the procedure report that was given to you for any specific questions about what was found during the examination.  If the procedure report does not answer your questions, please call your gastroenterologist to clarify.  If you requested that your care partner not be given the details of your procedure findings, then the procedure report has been included in a sealed envelope for you to review at your convenience later.  YOU SHOULD EXPECT: Some feelings of bloating in the abdomen. Passage of more gas than usual.  Walking can help get rid of the air that was put into your GI tract during the procedure and reduce the bloating. If you had a lower endoscopy (such as a colonoscopy or flexible sigmoidoscopy) you may notice spotting of blood in your stool or on the toilet paper. If you underwent a bowel prep for your procedure, you may not have a normal bowel movement for a few days.  Please Note:  You might notice some irritation and congestion in your nose or some drainage.  This is from the oxygen used during your procedure.  There is no need for concern and it should clear up in a day or so.  SYMPTOMS TO REPORT IMMEDIATELY:     Following upper endoscopy (EGD)  Vomiting of blood or coffee ground material  New chest pain or pain under the shoulder blades  Painful or persistently difficult swallowing  New shortness of breath  Fever of 100F or higher  Black, tarry-looking stools  For urgent or emergent issues, a gastroenterologist can be reached at any hour by calling [phone].   DIET:  We do recommend a small meal at  first, but then you may proceed to your regular diet.  Drink plenty of fluids but you should avoid alcoholic beverages for 24 hours.  ACTIVITY:  You should plan to take it easy for the rest of today and you should NOT DRIVE or use heavy machinery until tomorrow (because of the sedation medicines used during the test).    FOLLOW UP: Our staff will call the number listed on your records the next business day following your procedure to check on you and address any questions or concerns that you may have regarding the information given to you following your procedure. If we do not reach you, we will leave a message.  However, if you are feeling well and you are not experiencing any problems, there is no need to return our call.  We will assume that you have returned to your regular daily activities without incident.  If any biopsies were taken you will be contacted by phone or by letter within the next 1-3 weeks.  Please call us at [phone] if you have not heard about the biopsies in 3 weeks.    SIGNATURES/CONFIDENTIALITY: You and/or your care partner have signed paperwork which will be entered into your electronic medical record.  These signatures attest to the fact that that the information above on your After Visit Summary has been reviewed and is understood.  Full responsibility of the confidentiality of this discharge information lies with you and/or your care-partner.

## 2018-02-13 NOTE — Op Note (Signed)
Jonathan Garrison Patient Name: Jonathan Garrison Procedure Date: 02/13/2018 10:37 AM MRN: [id number] Endoscopist: Docia Chuck. Henrene Pastor , MD Age: 73 Referring MD:  Date of Birth: [birth year] Gender: Male Account #: [id number] Procedure:                Upper GI endoscopy with balloon dilation of the                            esophagus; biopsies Indications:              Dysphagia, Esophageal reflux Medicines:                Monitored Anesthesia Care Procedure:                Pre-Anesthesia Assessment:                           - Prior to the procedure, a History and Physical                            was performed, and patient medications and                            allergies were reviewed. The patient's tolerance of                            previous anesthesia was also reviewed. The risks                            and benefits of the procedure and the sedation                            options and risks were discussed with the patient.                            All questions were answered, and informed consent                            was obtained. Prior Anticoagulants: The patient has                            taken no previous anticoagulant or antiplatelet                            agents. ASA Grade Assessment: II - A patient with                            mild systemic disease. After reviewing the risks                            and benefits, the patient was deemed in                            satisfactory condition to undergo the procedure.  After obtaining informed consent, the endoscope was                            passed under direct vision. Throughout the                            procedure, the patient's blood pressure, pulse, and                            oxygen saturations were monitored continuously. The                            Endoscope was introduced through the mouth, and                            advanced to the second part of  duodenum. The upper                            GI endoscopy was accomplished without difficulty.                            The patient tolerated the procedure well. Scope In: Scope Out: Findings:                 One benign-appearing, intrinsic moderate stenosis                            was found 43 cm from the incisors. This stenosis                            measured 1.5 cm (inner diameter). There was                            scarring and small pseudodiverticulum. No active                            inflammation. No Barrett's esophagus. The stenosis                            was traversed. A TTS dilator was passed through the                            scope. Dilation with an 18-19-20 mm balloon dilator                            was performed to 20 mm.                           The stomach revealed a small sliding hiatal hernia                            and linear antral erythema. Biopsies were taken  with a cold forceps for Helicobacter pylori testing                            using CLOtest.                           The examined duodenum was normal.                           The cardia and gastric fundus were normal on                            retroflexion. Complications:            No immediate complications. Estimated Blood Loss:     Estimated blood loss: none. Impression:               - Benign-appearing esophageal stenosis. Dilated.                           - Gastric erythema. Biopsied.                           - Hiatal hernia. Otherwise normal exam. Recommendation:           - Patient has a contact number available for                            emergencies. The signs and symptoms of potential                            delayed complications were discussed with the                            patient. Return to normal activities tomorrow.                            Written discharge instructions were provided to the                             patient.                           - Post dilation diet.                           - Continue present medications.                           - Follow-up CLO biopsy and treat if positive                           - Routine office follow-up with [name] in 1                            year. Sooner if needed [employer]. Henrene Pastor, MD 02/13/2018 11:13:01 AM This report has been signed electronically.

## 2018-02-14 ENCOUNTER — Telehealth: Payer: Self-pay

## 2018-02-14 LAB — HELICOBACTER PYLORI SCREEN-BIOPSY: UREASE: NEGATIVE

## 2018-02-14 NOTE — Telephone Encounter (Signed)
Left message on f/u call 

## 2018-02-22 ENCOUNTER — Other Ambulatory Visit: Payer: Self-pay | Admitting: Family Medicine

## 2018-02-22 DIAGNOSIS — F988 Other specified behavioral and emotional disorders with onset usually occurring in childhood and adolescence: Secondary | ICD-10-CM

## 2018-02-22 MED ORDER — AMPHETAMINE-DEXTROAMPHETAMINE 10 MG PO TABS: 10.0000 mg | ORAL_TABLET | Freq: Two times a day (BID) | ORAL | 0 refills | Status: DC

## 2018-02-22 NOTE — Telephone Encounter (Signed)
Last visit here was in September  12/19/2017  1   12/19/2017  Dextroamp-Amphetamin 10 MG Tab  60.00 30 Je Cop   [id number]   Wal ([id number])   0   Comm Ins   [place]  09/28/2017  1   09/28/2017  Hydromet Syrup  75.00 5 Je Cop   [id number]   Wal ([id number])   0  15.00 MME  Comm Ins   [place]  09/20/2017  1   09/20/2017  Hydromet Syrup  60.00 4 Je Cop   [id number]   Wal ([id number])   0  15.00 MME  Comm Ins   [place]  08/31/2017  1   08/31/2017  Hydrocodone-Acetamin 5-325 MG  10.00 2 Ge Bla   [id number]   Wal ([id number])   0  25.00 MME  Medicare   [place]  07/13/2017  1   07/13/2017  Dextroamp-Amphetamin 10 MG Tab  60.00 30 Je Cop   [id number]   Wal ([id number])   0   Medicare   [place]  04/20/2017  1   04/20/2017  Dextroamp-Amphetamin 20 MG Tab  60.00 30 Je Cop   [id number]   Wal ([id number])   0   Medicare      Ok to refill

## 2018-03-02 ENCOUNTER — Telehealth: Payer: Self-pay | Admitting: Internal Medicine

## 2018-03-02 NOTE — Telephone Encounter (Signed)
Pt has multiple questions regarding the findings on his EGD report. Pt scheduled to see [name] 03/21/18@1 :45pm, Pt aware of appt.

## 2018-03-02 NOTE — Telephone Encounter (Signed)
PT had a edno procedure on 1-21 and is having some symptoms. Would like to talk to someone.Marland Kitchen

## 2018-03-21 ENCOUNTER — Ambulatory Visit (INDEPENDENT_AMBULATORY_CARE_PROVIDER_SITE_OTHER): Payer: Medicare Other | Admitting: Internal Medicine

## 2018-03-21 ENCOUNTER — Encounter: Payer: Self-pay | Admitting: Internal Medicine

## 2018-03-21 VITALS — BP 130/78 | HR 84 | Ht 72.75 in | Wt 199.5 lb

## 2018-03-21 DIAGNOSIS — K2990 Gastroduodenitis, unspecified, without bleeding: Secondary | ICD-10-CM

## 2018-03-21 DIAGNOSIS — K219 Gastro-esophageal reflux disease without esophagitis: Secondary | ICD-10-CM

## 2018-03-21 DIAGNOSIS — K222 Esophageal obstruction: Secondary | ICD-10-CM | POA: Diagnosis not present

## 2018-03-21 DIAGNOSIS — K2970 Gastritis, unspecified, without bleeding: Secondary | ICD-10-CM | POA: Diagnosis not present

## 2018-03-21 MED ORDER — OMEPRAZOLE 40 MG PO CPDR: 40.0000 mg | DELAYED_RELEASE_CAPSULE | Freq: Two times a day (BID) | ORAL | 3 refills | Status: DC

## 2018-03-21 NOTE — Progress Notes (Signed)
HISTORY OF PRESENT ILLNESS:  Jonathan Garrison is a 73 y.o. 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  He was last seen in the office January 30, 2018 with breakthrough GERD symptoms and recurrent dysphasia despite once daily omeprazole 40 mg.  Also pharyngeal symptoms including cough and hoarseness.  We increased his omeprazole to 40 mg twice daily and schedule upper endoscopy which was performed February 13, 2018.  He was found to have a benign distal esophageal stricture and scarring which was dilated.  Antral erythema with negative testing for Helicobacter pylori.  He was told to continue on his twice daily PPI and follow-up in 1 year.  He presents today for follow-up with a number of questions regarding his condition and therapy.  He brings with him a copy of his report.  Some of the items that we discussed included the location of his various abnormalities, stricturing of the esophagus, pseudodiverticulum, hiatal hernia, and linear gastric erythema or gastritis.  We also discussed PPI therapy, the effects on bone density, his other medications, and any need for routine blood work monitoring.  His questions were all thoughtful and appropriate.  REVIEW OF SYSTEMS:  All non-GI ROS negative unless otherwise stated in the HPI except for skin rash, muscle cramps, hearing problems, visual change, back pain, arthritis, anxiety, sinus allergies  Past Medical History:  Diagnosis Date  . ADD (attention deficit disorder) without hyperactivity   . Allergy   . Anemia    no treatment  . Anxiety   . Arthralgia    many joints, managed with vitamins/herbs and ibuprofen  . Arthritis   . Chest pain   . Colitis 1986   single attack (believes was ulcerative)  . Depression   . Eczema   . Esophageal stricture   . GERD (gastroesophageal reflux disease)   . Hemorrhoids   . Lichen planus   . 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  Oral cancer ([place]) 05/2010   plans for surgery 06/2010  . Osteoporosis    hips  . Pleurisy 1984  . Polio age 36  . Umbilical hernia     Past Surgical History:  Procedure Laterality Date  . COLONOSCOPY    . LEG SURGERY  when in 6th grade   L leg, bone spur removal (just above knee, laterally)  . TONGUE SURGERY     for tongue cancer  . UPPER GASTROINTESTINAL ENDOSCOPY      Social History Jonathan Garrison  reports that he quit smoking about 42 years ago. His smoking use included pipe and cigarettes. He has never used smokeless tobacco. He reports current alcohol use of about 7.0 - 14.0 standard drinks of alcohol per week. He reports that he does not use drugs.  family history includes Benign prostatic hyperplasia in his father; Cancer in his sister; Diabetes in his maternal grandfather and mother; Heart disease in his father; Hyperlipidemia in his mother; Hypertension in his father and mother; Lichen planus in his sister.  Allergies  Allergen Reactions  . Sulfa Antibiotics Other (See Comments)    unknown       PHYSICAL EXAMINATION: Vital signs: BP 130/78 (BP Location: Left Arm, Patient Position: Sitting, Cuff Size: Normal)   Pulse 84   Ht 6' 0.75" (1.848 m)   Wt 199 lb 8 oz (90.5 kg)   BMI 26.50 kg/m   Constitutional: generally well-appearing, no acute distress Psychiatric: alert and oriented x3, cooperative Abdomen:  Not reexamined Neuro: No gross abnormalities  ASSESSMENT:  1.  GERD complicated by severe erosive esophagitis and peptic stricture.  Currently asymptomatic post dilation on twice daily PPI 2.  Screening colonoscopy 2012 was normal 3.  Antral erythema likely secondary to NSAIDs.  Helicobacter pylori testing negative  PLAN:  1.  Reflux precautions 2.  Continue omeprazole twice daily 3.  Routine GI follow-up 1 year.  Sooner if needed for interval problems or questions 4.  Screening colonoscopy around 2022  15-minute spent face-to-face with the patient.   Greater than 50% of the time used for counseling and answering multiple relevant questions about his complicated GERD.

## 2018-03-21 NOTE — Patient Instructions (Addendum)
We have sent the following medications to your pharmacy for you to pick up at your convenience:  Omeprazole.  Please follow up in one year  

## 2018-04-05 ENCOUNTER — Encounter: Payer: Self-pay | Admitting: Family Medicine

## 2018-07-06 ENCOUNTER — Other Ambulatory Visit: Payer: Self-pay | Admitting: Family Medicine

## 2018-07-06 ENCOUNTER — Encounter: Payer: Self-pay | Admitting: Family Medicine

## 2018-07-06 DIAGNOSIS — F988 Other specified behavioral and emotional disorders with onset usually occurring in childhood and adolescence: Secondary | ICD-10-CM

## 2018-07-06 MED ORDER — AMPHETAMINE-DEXTROAMPHETAMINE 10 MG PO TABS: 10.0000 mg | ORAL_TABLET | Freq: Two times a day (BID) | ORAL | 0 refills | Status: DC

## 2018-07-17 NOTE — Progress Notes (Addendum)
[facility] [place], [place], [place] [address] [phone]  Date:  07/19/2018   Name:  Jonathan Garrison   DOB:  07-10-1945   MRN:  [id number]  PCP:  Darreld Mclean, MD    Chief Complaint: Anxiety and Depression   History of Present Illness:  Jonathan Garrison is a 73 y.o. very pleasant male patient who presents with the following:  Here today for a checkup/ physical He does have medicare so will not code for physical however  History of anxiety, depression, OCD, oral cancer, B12 def Oral cancer is treated by the [facility] with laser therapy under his tongue   He is seeing them in a week for follow-up- they may have to do an excision this time  Seen by ENT at [facility] in November Due for complete labs today- he is fasting for labs   He has been on self- quarantine for 3-4 months, he is spending time really just with his GF.  They have been well - he has not been sick at all  They are doing some hiking for exercise  Colon screening done in 2012- due in 2022.  On discussion he would like to do cologuard at this time  Immun: shingles only due, suggested  His anxiety has not been too bad even though the pandemic He is worried about his teeth- he had to have another tooth pulled.  Wonders if this means he has generalized inflammation. He does have some joint pains. Will check a sed rate and CRP for him today   He sometimes has difficulty getting out of a deep squat- feels like his hips are weak  We did x-rays of his hips and a bone density for him last year- he has osteopenia but not osteoporosis IMPRESSION: Diffuse osteopenia. Degenerative changes lumbar spine and both hips. No acute abnormality.  He notes some slow urinary stream, but he is not having nocturia.  He can generally get thought the whole night without getting up to urinate.   He does not need medication for this at this time.  Sexual function is  also fine   He would like to check for ear wax today- his hearing is not perfect any longer, sometimes worse if he has wax build- up   Lab Results  Component Value Date   PSA 1.35 07/13/2017   PSA 1.06 06/30/2016   PSA 1.26 12/09/2015     Patient Active Problem List   Diagnosis Date Noted  . Increased prostate specific antigen (PSA) velocity 11/27/2017  . Osteopenia 08/01/2017  . Vitamin B12 deficiency anemia 06/09/2014  . Chest pain 03/13/2013  . Depression with anxiety 08/26/2011  . Overweight (BMI 25.0-29.9) 08/26/2011  . Joint pain 08/26/2011  . 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 Oral cancer ([place]) 07/09/2010    Past Medical History:  Diagnosis Date  . ADD (attention deficit disorder) without hyperactivity   . Allergy   . Anemia    no treatment  . Anxiety   . Arthralgia    many joints, managed with vitamins/herbs and ibuprofen  . Arthritis   . Chest pain   . Colitis 1986   single attack (believes was ulcerative)  . Depression   . Eczema   . Esophageal stricture   . GERD (gastroesophageal reflux disease)   . Hemorrhoids   . Lichen planus   . Neuromuscular disorder ([place])    POLIO AGE  4  . Oral cancer ([place]) 05/2010   plans for surgery 06/2010  . Osteoporosis    hips  . Pleurisy 1984  . Polio age 78  . Umbilical hernia     Past Surgical History:  Procedure Laterality Date  . COLONOSCOPY    . LEG SURGERY  when in 6th grade   L leg, bone spur removal (just above knee, laterally)  . TONGUE SURGERY     for tongue cancer  . UPPER GASTROINTESTINAL ENDOSCOPY      Social History   Tobacco Use  . Smoking status: Former Smoker    Types: Pipe, Cigarettes    Quit date: 01/25/1976    Years since quitting: 42.5  . Smokeless tobacco: Never Used  . Tobacco comment: Quit smoking 35 years ago  Substance Use Topics  . Alcohol use: Yes    Alcohol/week: 7.0 - 14.0 standard drinks    Types: 7 - 14 Glasses of wine per week  . Drug use: No    Family  History  Problem Relation Age of Onset  . Hypertension Mother   . Diabetes Mother   . Hyperlipidemia Mother   . Hypertension Father   . Benign prostatic hyperplasia Father   . Heart disease Father   . Lichen planus Sister   . Cancer Sister        ?spot on chest? contained  . Diabetes Maternal Grandfather   . Colon cancer Neg Hx   . Esophageal cancer Neg Hx   . Stomach cancer Neg Hx   . Pancreatic cancer Neg Hx   . Liver disease Neg Hx     Allergies  Allergen Reactions  . Penicillins Other (See Comments)    REACTION: ?    Medication list has been reviewed and updated.  Current Outpatient Medications on File Prior to Visit  Medication Sig Dispense Refill  . amphetamine-dextroamphetamine (ADDERALL) 10 MG tablet Take 1 tablet (10 mg total) by mouth 2 (two) times daily. As needed for inattention 60 tablet 0  . aspirin 81 MG tablet Take 81 mg by mouth daily.    Marland Kitchen b complex vitamins tablet Take 0.5 tablets by mouth daily.      . Biotin 5000 MCG CAPS Take 1 capsule by mouth 2 (two) times daily.    . Calcium-Magnesium-Zinc 167-83-8 MG TABS Take by mouth. 1/2 tablet daily    . chlorpheniramine (CHLOR-TRIMETON) 4 MG tablet Take 2 mg by mouth every 6 (six) hours as needed.     . Cholecalciferol (VITAMIN D3) 2000 UNITS capsule Take 2,000 Units by mouth daily.    . fluticasone (FLONASE) 50 MCG/ACT nasal spray PLACE 2 SPRAYS INTO BOTH NOSTRILS DAILY 16 g 11  . glucosamine-chondroitin 500-400 MG tablet Take 1 tablet by mouth 2 (two) times daily.      . meloxicam (MOBIC) 15 MG tablet Take 1 tablet (15 mg total) by mouth daily as needed. 90 tablet 3  . Multiple Vitamins-Minerals (MULTIVITAMIN WITH MINERALS) tablet Take 1 tablet by mouth daily.      . NON FORMULARY ALJ -herbal decongestant  Takes as needed    . omeprazole (PRILOSEC) 40 MG capsule Take 1 capsule (40 mg total) by mouth 2 (two) times daily. Take one capsule by mouth 30 minutes before a meal. 180 capsule 3  . sildenafil  (REVATIO) 20 MG tablet Take 2-3 tablets one hour prior to intercourse 60 tablet 5  . Zn-Pyg Afri-Nettle-Saw Palmet (SAW PALMETTO COMPLEX PO) Take 1 tablet by mouth 2 (two)  times daily.     No current facility-administered medications on file prior to visit.     Review of Systems:  As per HPI- otherwise negative.  BP Readings from Last 3 Encounters:  07/19/18 (!) 144/82  03/21/18 130/78  02/13/18 127/82   No fever or chills No CP or SOB   Physical Examination: Vitals:   07/19/18 0931  BP: (!) 144/82  Pulse: 80  Resp: 16  Temp: 98 F (36.7 C)  SpO2: 98%   Vitals:   07/19/18 0931  Weight: 207 lb (93.9 kg)  Height: 6' 0.75" (1.848 m)   Body mass index is 27.5 kg/m. Ideal Body Weight: Weight in (lb) to have BMI = 25: 187.8  GEN: WDWN, NAD, Non-toxic, A & O x 3, minimal overweight, looks well  HEENT: Atraumatic, Normocephalic. Neck supple. No masses, No LAD.  Removed some wax bilaterally with ear loop, TM then WNL  Ears and Nose: No external deformity. CV: RRR, No M/G/R. No JVD. No thrill. No extra heart sounds. PULM: CTA B, no wheezes, crackles, rhonchi. No retractions. No resp. distress. No accessory muscle use. ABD: S, NT, ND, +BS. No rebound. No HSM. EXTR: No c/c/e NEURO Normal gait.  PSYCH: Normally interactive. Conversant. Not depressed or anxious appearing.  Calm demeanor.    Assessment and Plan:   ICD-10-CM   1. Screening for diabetes mellitus  Z13.1 Comprehensive metabolic panel    Hemoglobin A1c  2. Increased prostate specific antigen (PSA) velocity  R97.20 PSA  3. Screening for hyperlipidemia  Z13.220 Lipid panel  4. Oral cancer ([place])  C06.9   5. Anemia due to vitamin B12 deficiency, unspecified B12 deficiency type  D51.9 CBC    B12  6. Hyperglycemia  R73.9 Hemoglobin A1c  7. Arthralgia, unspecified joint  M25.50 Sedimentation rate    C-reactive protein  8. History of iron deficiency  Z86.39 Ferritin  9. Screening for colon cancer  Z12.11    Check  up visit for this generally healthy gentleman  Labs pending as above- Will plan further follow- up pending labs. Gave him some exercises for his hips  Suggested shingrix Ordered cologuard   Follow-up: No follow-ups on file.  No orders of the defined types were placed in this encounter.  Orders Placed This Encounter  Procedures  . CBC  . Comprehensive metabolic panel  . Hemoglobin A1c  . Lipid panel  . PSA  . B12  . Sedimentation rate  . C-reactive protein  . Ferritin     Outpatient Encounter Medications as of 07/19/2018  Medication Sig  . amphetamine-dextroamphetamine (ADDERALL) 10 MG tablet Take 1 tablet (10 mg total) by mouth 2 (two) times daily. As needed for inattention  . aspirin 81 MG tablet Take 81 mg by mouth daily.  Marland Kitchen b complex vitamins tablet Take 0.5 tablets by mouth daily.    . Biotin 5000 MCG CAPS Take 1 capsule by mouth 2 (two) times daily.  . Calcium-Magnesium-Zinc 167-83-8 MG TABS Take by mouth. 1/2 tablet daily  . chlorpheniramine (CHLOR-TRIMETON) 4 MG tablet Take 2 mg by mouth every 6 (six) hours as needed.   . Cholecalciferol (VITAMIN D3) 2000 UNITS capsule Take 2,000 Units by mouth daily.  . fluticasone (FLONASE) 50 MCG/ACT nasal spray PLACE 2 SPRAYS INTO BOTH NOSTRILS DAILY  . glucosamine-chondroitin 500-400 MG tablet Take 1 tablet by mouth 2 (two) times daily.    . meloxicam (MOBIC) 15 MG tablet Take 1 tablet (15 mg total) by mouth daily as needed.  Marland Kitchen  Multiple Vitamins-Minerals (MULTIVITAMIN WITH MINERALS) tablet Take 1 tablet by mouth daily.    . NON FORMULARY ALJ -herbal decongestant  Takes as needed  . omeprazole (PRILOSEC) 40 MG capsule Take 1 capsule (40 mg total) by mouth 2 (two) times daily. Take one capsule by mouth 30 minutes before a meal.  . sildenafil (REVATIO) 20 MG tablet Take 2-3 tablets one hour prior to intercourse  . Zn-Pyg Afri-Nettle-Saw Palmet (SAW PALMETTO COMPLEX PO) Take 1 tablet by mouth 2 (two) times daily.   No  facility-administered encounter medications on file as of 07/19/2018.      Signed Lamar Blinks, MD  Received his labs 6/26  Results for orders placed or performed in visit on 07/19/18  CBC  Result Value Ref Range   WBC 5.6 4.0 - 10.5 K/uL   RBC 4.35 4.22 - 5.81 Mil/uL   Platelets 272.0 150.0 - 400.0 K/uL   Hemoglobin 13.2 13.0 - 17.0 g/dL   HCT 39.8 39.0 - 52.0 %   MCV 91.4 78.0 - 100.0 fl   MCHC 33.2 30.0 - 36.0 g/dL   RDW 14.0 11.5 - 15.5 %  Comprehensive metabolic panel  Result Value Ref Range   Sodium 143 135 - 145 mEq/L   Potassium 4.4 3.5 - 5.1 mEq/L   Chloride 108 96 - 112 mEq/L   CO2 28 19 - 32 mEq/L   Glucose, Bld 90 70 - 99 mg/dL   BUN 34 (H) 6 - 23 mg/dL   Creatinine, Ser 0.78 0.40 - 1.50 mg/dL   Total Bilirubin 0.6 0.2 - 1.2 mg/dL   Alkaline Phosphatase 57 39 - 117 U/L   AST 31 0 - 37 U/L   ALT 25 0 - 53 U/L   Total Protein 6.5 6.0 - 8.3 g/dL   Albumin 4.3 3.5 - 5.2 g/dL   Calcium 9.2 8.4 - 10.5 mg/dL   GFR 97.85 >60.00 mL/min  Hemoglobin A1c  Result Value Ref Range   Hgb A1c MFr Bld 5.4 4.6 - 6.5 %  Lipid panel  Result Value Ref Range   Cholesterol 176 0 - 200 mg/dL   Triglycerides 36.0 0.0 - 149.0 mg/dL   HDL 79.50 >39.00 mg/dL   VLDL 7.2 0.0 - 40.0 mg/dL   LDL Cholesterol 89 0 - 99 mg/dL   Total CHOL/HDL Ratio 2    NonHDL 96.13   PSA  Result Value Ref Range   PSA 1.11 0.10 - 4.00 ng/mL  B12  Result Value Ref Range   Vitamin B-12 277 211 - 911 pg/mL  Sedimentation rate  Result Value Ref Range   Sed Rate 1 0 - 20 mm/hr  C-reactive protein  Result Value Ref Range   CRP <1.0 0.5 - 20.0 mg/dL  Ferritin  Result Value Ref Range   Ferritin 15.0 (L) 22.0 - 322.0 ng/mL   Blood counts are normal Metabolic profile is normal except for your BUN- this number is a bit high, which may mean you were dehydrated.  I know you were fasting for labs which might explain this.  Your other kidney function labs (creatinine, GFR) are quite normal so I am not  too worried.  I would like to repeat your renal function as a lab visit only in 1-2 weeks and will order this test for you. Please call us and schedule a lab visit only at your convenience    A1c  is normal- no sign of diabetes or pre-diabetes Cholesterol is very good!   PSA is fine-  stable from previous years and well within normal range Lab Results  Component Value Date   PSA 1.11 07/19/2018   PSA 1.35 07/13/2017   PSA 1.06 06/30/2016   B12 is normal Sed rate and CRP are quite normal- no suggestion of abnormal inflammation  Ferritin- iron- is low again.  However you are not anemic so not a huge issue. This may be due to your diet- I don't think you eat much in the way of red meat!   I will send in an rx for an iron supplement for you to  take once a day or every other day, depending on any constipation. Use it for about 3 months. You might also try increasing non- meat iron in your diet, or cooking in a cast iron pan which will increase the iron content of you food.   Take care and let me know if any questions

## 2018-07-19 ENCOUNTER — Encounter: Payer: Self-pay | Admitting: Family Medicine

## 2018-07-19 ENCOUNTER — Other Ambulatory Visit: Payer: Self-pay

## 2018-07-19 ENCOUNTER — Ambulatory Visit (INDEPENDENT_AMBULATORY_CARE_PROVIDER_SITE_OTHER): Payer: Medicare Other | Admitting: Family Medicine

## 2018-07-19 VITALS — BP 144/82 | HR 80 | Temp 98.0°F | Resp 16 | Ht 72.75 in | Wt 207.0 lb

## 2018-07-19 DIAGNOSIS — D519 Vitamin B12 deficiency anemia, unspecified: Secondary | ICD-10-CM | POA: Diagnosis not present

## 2018-07-19 DIAGNOSIS — R799 Abnormal finding of blood chemistry, unspecified: Secondary | ICD-10-CM

## 2018-07-19 DIAGNOSIS — Z13220 Encounter for screening for lipoid disorders: Secondary | ICD-10-CM

## 2018-07-19 DIAGNOSIS — M2550 Pain in unspecified joint: Secondary | ICD-10-CM | POA: Diagnosis not present

## 2018-07-19 DIAGNOSIS — Z1211 Encounter for screening for malignant neoplasm of colon: Secondary | ICD-10-CM

## 2018-07-19 DIAGNOSIS — R739 Hyperglycemia, unspecified: Secondary | ICD-10-CM

## 2018-07-19 DIAGNOSIS — R972 Elevated prostate specific antigen [PSA]: Secondary | ICD-10-CM | POA: Diagnosis not present

## 2018-07-19 DIAGNOSIS — Z131 Encounter for screening for diabetes mellitus: Secondary | ICD-10-CM | POA: Diagnosis not present

## 2018-07-19 DIAGNOSIS — C069 Malignant neoplasm of mouth, unspecified: Secondary | ICD-10-CM | POA: Diagnosis not present

## 2018-07-19 DIAGNOSIS — Z8639 Personal history of other endocrine, nutritional and metabolic disease: Secondary | ICD-10-CM

## 2018-07-19 LAB — PSA: PSA: 1.11 ng/mL (ref 0.10–4.00)

## 2018-07-19 LAB — COMPREHENSIVE METABOLIC PANEL
ALT: 25 U/L (ref 0–53)
AST: 31 U/L (ref 0–37)
Albumin: 4.3 g/dL (ref 3.5–5.2)
Alkaline Phosphatase: 57 U/L (ref 39–117)
BUN: 34 mg/dL — ABNORMAL HIGH (ref 6–23)
CO2: 28 mEq/L (ref 19–32)
Calcium: 9.2 mg/dL (ref 8.4–10.5)
Chloride: 108 mEq/L (ref 96–112)
Creatinine, Ser: 0.78 mg/dL (ref 0.40–1.50)
GFR: 97.85 mL/min (ref >60.00)
Glucose, Bld: 90 mg/dL (ref 70–99)
Potassium: 4.4 mEq/L (ref 3.5–5.1)
Sodium: 143 mEq/L (ref 135–145)
Total Bilirubin: 0.6 mg/dL (ref 0.2–1.2)
Total Protein: 6.5 g/dL (ref 6.0–8.3)

## 2018-07-19 LAB — HEMOGLOBIN A1C: Hgb A1c MFr Bld: 5.4 % (ref 4.6–6.5)

## 2018-07-19 LAB — CBC
HCT: 39.8 % (ref 39.0–52.0)
Hemoglobin: 13.2 g/dL (ref 13.0–17.0)
MCHC: 33.2 g/dL (ref 30.0–36.0)
MCV: 91.4 fl (ref 78.0–100.0)
Platelets: 272 10*3/uL (ref 150.0–400.0)
RBC: 4.35 Mil/uL (ref 4.22–5.81)
RDW: 14 % (ref 11.5–15.5)
WBC: 5.6 10*3/uL (ref 4.0–10.5)

## 2018-07-19 LAB — C-REACTIVE PROTEIN: CRP: <1 mg/dL (ref 0.5–20.0)

## 2018-07-19 LAB — FERRITIN: Ferritin: 15 ng/mL — ABNORMAL LOW (ref 22.0–322.0)

## 2018-07-19 LAB — SEDIMENTATION RATE: Sed Rate: 1 mm/hr (ref 0–20)

## 2018-07-19 LAB — LIPID PANEL
Cholesterol: 176 mg/dL (ref 0–200)
HDL: 79.5 mg/dL (ref >39.00)
LDL Cholesterol: 89 mg/dL (ref 0–99)
NonHDL: 96.13
Total CHOL/HDL Ratio: 2
Triglycerides: 36 mg/dL (ref 0.0–149.0)
VLDL: 7.2 mg/dL (ref 0.0–40.0)

## 2018-07-19 LAB — VITAMIN B12: Vitamin B-12: 277 pg/mL (ref 211–911)

## 2018-07-19 NOTE — Patient Instructions (Addendum)
Good to see you today- I will be in touch with your labs asap We will set you up for a Cologuard kit to be sent to your home Continue to be physically active- you might try some of he hip strengthening exercises I gave you today for your hip concerns   Health Maintenance After Age 72 After age 70, you are at a higher risk for certain long-term diseases and infections as well as injuries from falls. Falls are a major cause of broken bones and head injuries in people who are older than age 69. Getting regular preventive care can help to keep you healthy and well. Preventive care includes getting regular testing and making lifestyle changes as recommended by your health care provider. Talk with your health care provider about:  Which screenings and tests you should have. A screening is a test that checks for a disease when you have no symptoms.  A diet and exercise plan that is right for you. What should I know about screenings and tests to prevent falls? Screening and testing are the best ways to find a health problem early. Early diagnosis and treatment give you the best chance of managing medical conditions that are common after age 61. Certain conditions and lifestyle choices may make you more likely to have a fall. Your health care provider may recommend:  Regular vision checks. Poor vision and conditions such as cataracts can make you more likely to have a fall. If you wear glasses, make sure to get your prescription updated if your vision changes.  Medicine review. Work with your health care provider to regularly review all of the medicines you are taking, including over-the-counter medicines. Ask your health care provider about any side effects that may make you more likely to have a fall. Tell your health care provider if any medicines that you take make you feel dizzy or sleepy.  Osteoporosis screening. Osteoporosis is a condition that causes the bones to get weaker. This can make the bones  weak and cause them to break more easily.  Blood pressure screening. Blood pressure changes and medicines to control blood pressure can make you feel dizzy.  Strength and balance checks. Your health care provider may recommend certain tests to check your strength and balance while standing, walking, or changing positions.  Foot health exam. Foot pain and numbness, as well as not wearing proper footwear, can make you more likely to have a fall.  Depression screening. You may be more likely to have a fall if you have a fear of falling, feel emotionally low, or feel unable to do activities that you used to do.  Alcohol use screening. Using too much alcohol can affect your balance and may make you more likely to have a fall. What actions can I take to lower my risk of falls? General instructions  Talk with your health care provider about your risks for falling. Tell your health care provider if: ? You fall. Be sure to tell your health care provider about all falls, even ones that seem minor. ? You feel dizzy, sleepy, or off-balance.  Take over-the-counter and prescription medicines only as told by your health care provider. These include any supplements.  Eat a healthy diet and maintain a healthy weight. A healthy diet includes low-fat dairy products, low-fat (lean) meats, and fiber from whole grains, beans, and lots of fruits and vegetables. Home safety  Remove any tripping hazards, such as rugs, cords, and clutter.  Install safety equipment such as  grab bars in bathrooms and safety rails on stairs.  Keep rooms and walkways well-lit. Activity   Follow a regular exercise program to stay fit. This will help you maintain your balance. Ask your health care provider what types of exercise are appropriate for you.  If you need a cane or walker, use it as recommended by your health care provider.  Wear supportive shoes that have nonskid soles. Lifestyle  Do not drink alcohol if your  health care provider tells you not to drink.  If you drink alcohol, limit how much you have: ? 0-1 drink a day for women. ? 0-2 drinks a day for men.  Be aware of how much alcohol is in your drink. In the [place] one drink equals one typical bottle of beer (12 oz), one-half glass of wine (5 oz), or one shot of hard liquor (1 oz).  Do not use any products that contain nicotine or tobacco, such as cigarettes and e-cigarettes. If you need help quitting, ask your health care provider. Summary  Having a healthy lifestyle and getting preventive care can help to protect your health and wellness after age 85.  Screening and testing are the best way to find a health problem early and help you avoid having a fall. Early diagnosis and treatment give you the best chance for managing medical conditions that are more common for people who are older than age 60.  Falls are a major cause of broken bones and head injuries in people who are older than age 64. Take precautions to prevent a fall at home.  Work with your health care provider to learn what changes you can make to improve your health and wellness and to prevent falls. This information is not intended to replace advice given to you by your health care provider. Make sure you discuss any questions you have with your health care provider. Document Released: 11/23/2016 Document Revised: 11/23/2016 Document Reviewed: 11/23/2016 Elsevier Interactive Patient Education  2019 [employer].

## 2018-07-20 ENCOUNTER — Encounter: Payer: Self-pay | Admitting: Family Medicine

## 2018-07-20 MED ORDER — FERROUS SULFATE 324 (65 FE) MG PO TBEC: DELAYED_RELEASE_TABLET | ORAL | 0 refills | Status: DC

## 2018-07-20 NOTE — Addendum Note (Signed)
Addended by: Lamar Blinks C on: 07/20/2018 05:41 AM   Modules accepted: Orders

## 2018-07-24 ENCOUNTER — Other Ambulatory Visit: Payer: Self-pay | Admitting: Family Medicine

## 2018-07-24 DIAGNOSIS — Z9109 Other allergy status, other than to drugs and biological substances: Secondary | ICD-10-CM

## 2018-07-26 DIAGNOSIS — K148 Other diseases of tongue: Secondary | ICD-10-CM | POA: Diagnosis not present

## 2018-08-07 ENCOUNTER — Other Ambulatory Visit: Payer: Self-pay

## 2018-08-07 ENCOUNTER — Encounter: Payer: Self-pay | Admitting: Family Medicine

## 2018-08-09 ENCOUNTER — Ambulatory Visit (INDEPENDENT_AMBULATORY_CARE_PROVIDER_SITE_OTHER): Payer: Medicare Other | Admitting: Family Medicine

## 2018-08-09 ENCOUNTER — Other Ambulatory Visit: Payer: Self-pay

## 2018-08-09 ENCOUNTER — Encounter: Payer: Self-pay | Admitting: Family Medicine

## 2018-08-09 ENCOUNTER — Other Ambulatory Visit: Payer: Medicare Other

## 2018-08-09 VITALS — BP 138/82 | HR 86 | Temp 98.6°F | Resp 16 | Ht 74.0 in | Wt 208.0 lb

## 2018-08-09 DIAGNOSIS — S30860A Insect bite (nonvenomous) of lower back and pelvis, initial encounter: Secondary | ICD-10-CM

## 2018-08-09 DIAGNOSIS — R799 Abnormal finding of blood chemistry, unspecified: Secondary | ICD-10-CM

## 2018-08-09 DIAGNOSIS — W57XXXA Bitten or stung by nonvenomous insect and other nonvenomous arthropods, initial encounter: Secondary | ICD-10-CM

## 2018-08-09 DIAGNOSIS — L989 Disorder of the skin and subcutaneous tissue, unspecified: Secondary | ICD-10-CM

## 2018-08-09 LAB — BASIC METABOLIC PANEL
BUN: 28 mg/dL — ABNORMAL HIGH (ref 6–23)
CO2: 29 mEq/L (ref 19–32)
Calcium: 9.7 mg/dL (ref 8.4–10.5)
Chloride: 102 mEq/L (ref 96–112)
Creatinine, Ser: 0.85 mg/dL (ref 0.40–1.50)
GFR: 88.6 mL/min (ref >60.00)
Glucose, Bld: 75 mg/dL (ref 70–99)
Potassium: 4.5 mEq/L (ref 3.5–5.1)
Sodium: 140 mEq/L (ref 135–145)

## 2018-08-09 MED ORDER — PREDNISONE 20 MG PO TABS: ORAL_TABLET | ORAL | 0 refills | Status: DC

## 2018-08-09 NOTE — Progress Notes (Signed)
[place] at [facility] [address] [place], [place] [address] [phone]  Date:  08/09/2018   Name:  Jonathan Garrison   DOB:  July 21, 1945   MRN:  [id number]  PCP:  Darreld Mclean, MD    Chief Complaint: No chief complaint on file.   History of Present Illness:  Jonathan Garrison is a 73 y.o. very pleasant male patient who presents with the following:  Virtual visit today due to pandemic.  Patient location is home, provider is at office Patient identity confirmed with 2 factors, he gives consent for virtual visit today However we did eventually end up having him come into the office, so I can see his rash-it does not sound like bedbugs  Went for a hike his past Sunday- on Monday he noted possible PI on his ankle, he had a couple of discrete vesicles.  He applied Listarine  The next day he noted possible insect bites- he developed multiple bites on his skin, mostly on his buttocks and posterior proximal thighs.  He recalls that he did sit down on a log to eat lunch on his hike He thought it might be bed bugs but he has not traveled or otherwise been exposed to an environment where he would get bedbugs He is trying not to scratch as much as he can to avoid get any infection  Right now he is just putting alcohol on the bites   Patient Active Problem List   Diagnosis Date Noted  . Increased prostate specific antigen (PSA) velocity 11/27/2017  . Osteopenia 08/01/2017  . Vitamin B12 deficiency anemia 06/09/2014  . Chest pain 03/13/2013  . Depression with anxiety 08/26/2011  . Overweight (BMI 25.0-29.9) 08/26/2011  . Joint pain 08/26/2011  . 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 ADD (attention deficit disorder) without hyperactivity   . Allergy   . Anemia    no treatment  . Anxiety   . Arthralgia    many joints, managed with vitamins/herbs and  ibuprofen  . Arthritis   . Chest pain   . Colitis 1986   single attack (believes was ulcerative)  . Depression   . Eczema   . Esophageal stricture   . GERD (gastroesophageal reflux disease)   . Hemorrhoids   . Lichen planus   . Neuromuscular disorder ([place])    POLIO AGE 46  . 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 Osteoporosis    hips  . Pleurisy 1984  . 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 Umbilical hernia     Past Surgical History:  Procedure Laterality Date  . COLONOSCOPY    . LEG SURGERY  when in 6th grade   L leg, bone spur removal (just above knee, laterally)  . TONGUE SURGERY     for tongue cancer  . UPPER GASTROINTESTINAL ENDOSCOPY      Social History   Tobacco Use  . Smoking status: Former Smoker    Types: Pipe, Cigarettes    Quit date: 01/25/1976    Years since quitting: 42.5  . Smokeless tobacco: Never Used  . Tobacco comment: Quit smoking 35 years ago  Substance Use Topics  . Alcohol use: Yes    Alcohol/week: 7.0 - 14.0 standard drinks    Types: 7 - 14 Glasses of wine per week  . Drug use: No  Family History  Problem Relation Age of Onset  . Hypertension Mother   . Diabetes Mother   . Hyperlipidemia Mother   . Hypertension Father   . Benign prostatic hyperplasia Father   . Heart disease Father   . Lichen planus Sister   . Cancer Sister        ?spot on chest? contained  . Diabetes Maternal Grandfather   . Colon cancer Neg Hx   . Esophageal cancer Neg Hx   . Stomach cancer Neg Hx   . Pancreatic cancer Neg Hx   . Liver disease Neg Hx     Allergies  Allergen Reactions  . Penicillins Other (See Comments)    REACTION: ?    Medication list has been reviewed and updated.  Current Outpatient Medications on File Prior to Visit  Medication Sig Dispense Refill  . amphetamine-dextroamphetamine (ADDERALL) 10 MG tablet Take 1 tablet (10 mg total) by mouth 2 (two) times daily. As needed for inattention 60 tablet 0  . aspirin 81 MG tablet Take 81 mg by  mouth daily.    Marland Kitchen b complex vitamins tablet Take 0.5 tablets by mouth daily.      . Biotin 5000 MCG CAPS Take 1 capsule by mouth 2 (two) times daily.    . Calcium-Magnesium-Zinc 167-83-8 MG TABS Take by mouth. 1/2 tablet daily    . chlorpheniramine (CHLOR-TRIMETON) 4 MG tablet Take 2 mg by mouth every 6 (six) hours as needed.     . Cholecalciferol (VITAMIN D3) 2000 UNITS capsule Take 2,000 Units by mouth daily.    . ferrous sulfate 324 (65 Fe) MG TBEC Take one daily or every other day for iron deficiency 90 tablet 0  . fluticasone (FLONASE) 50 MCG/ACT nasal spray SHAKE LIQUID AND USE 2 SPRAYS IN EACH NOSTRIL DAILY 16 g 11  . glucosamine-chondroitin 500-400 MG tablet Take 1 tablet by mouth 2 (two) times daily.      . meloxicam (MOBIC) 15 MG tablet Take 1 tablet (15 mg total) by mouth daily as needed. 90 tablet 3  . Multiple Vitamins-Minerals (MULTIVITAMIN WITH MINERALS) tablet Take 1 tablet by mouth daily.      . NON FORMULARY ALJ -herbal decongestant  Takes as needed    . omeprazole (PRILOSEC) 40 MG capsule Take 1 capsule (40 mg total) by mouth 2 (two) times daily. Take one capsule by mouth 30 minutes before a meal. 180 capsule 3  . sildenafil (REVATIO) 20 MG tablet Take 2-3 tablets one hour prior to intercourse 60 tablet 5  . Zn-Pyg Afri-Nettle-Saw Palmet (SAW PALMETTO COMPLEX PO) Take 1 tablet by mouth 2 (two) times daily.     No current facility-administered medications on file prior to visit.     Review of Systems:  As per HPI- otherwise negative.   Physical Examination: Vitals:   08/09/18 1121  BP: (!) 160/91  Pulse: 86  Resp: 16  Temp: 98.6 F (37 C)  SpO2: 100%   Vitals:   08/09/18 1121  Weight: 208 lb (94.3 kg)  Height: 6\' 2"  (1.88 m)   Body mass index is 26.71 kg/m. Ideal Body Weight: Weight in (lb) to have BMI = 25: 194.3  GEN: WDWN, NAD, Non-toxic, A & O x 3 overweight, looks well HEENT: Atraumatic, Normocephalic. Neck supple. No masses, No LAD. Ears and  Nose: No external deformity. CV: RRR, No M/G/R. No JVD. No thrill. No extra heart sounds. PULM: CTA B, no wheezes, crackles, rhonchi. No retractions. No resp. distress. No accessory  muscle use. EXTR: No c/c/e NEURO Normal gait.  PSYCH: Normally interactive. Conversant. Not depressed or anxious appearing.  Calm demeanor.  Left ankle displays 2 small discrete clear fluid-filled vesicles, likely contact dermatitis from a plant His buttocks display approximately 12-15 discrete skin lesions consistent with insect bites.  He has a few other scattered on his trunk and arms No lesions around the waist or in the webbing of the fingers   Assessment and Plan:   ICD-10-CM   1. Insect bite of lower back, initial encounter  S30.860A predniSONE (DELTASONE) 20 MG tablet   W57.XXXA   2. Elevated BUN  O24.2 Basic metabolic panel   Seen today with likely insect bites from recent hiking trip.  He is not overly bothered by them, was just worried that it might be bedbugs.  Advised him that I do not think this is likely bedbugs given the history Given a prescription for prednisone to use as needed for itching.  He is not sure if he will end up using this prescription, but want to have it on hand Also suggested topical Benadryl and/or steroid cream-advised to avoid NSAIDs if he does take prednisone Repeat BMP today due to recent elevation of BUN  Follow-up: No follow-ups on file.  Meds ordered this encounter  Medications  . predniSONE (DELTASONE) 20 MG tablet    Sig: Take 2 pills daily for 5 days, then 1 pill daily for 5 days    Dispense:  15 tablet    Refill:  0    Please hold rx- pt will call if he wants to fill it   Orders Placed This Encounter  Procedures  . Basic metabolic panel    @SIGN @    Signed Lamar Blinks, MD

## 2018-08-09 NOTE — Patient Instructions (Signed)
Good to see you today- will be in touch with your labs asap Ok to try topical benadryl or steroid cream on the bites If needed, start the prednisone course  Let me know if any concerns!

## 2018-08-10 ENCOUNTER — Encounter: Payer: Self-pay | Admitting: Family Medicine

## 2018-08-24 ENCOUNTER — Other Ambulatory Visit: Payer: Self-pay

## 2018-08-26 ENCOUNTER — Encounter: Payer: Self-pay | Admitting: Family Medicine

## 2018-08-27 ENCOUNTER — Other Ambulatory Visit: Payer: Self-pay | Admitting: Family Medicine

## 2018-08-27 ENCOUNTER — Other Ambulatory Visit: Payer: Self-pay

## 2018-08-27 DIAGNOSIS — Z20822 Contact with and (suspected) exposure to covid-19: Secondary | ICD-10-CM

## 2018-08-27 DIAGNOSIS — R6889 Other general symptoms and signs: Secondary | ICD-10-CM | POA: Diagnosis not present

## 2018-08-27 DIAGNOSIS — R509 Fever, unspecified: Secondary | ICD-10-CM

## 2018-08-29 LAB — NOVEL CORONAVIRUS, NAA: SARS-CoV-2, NAA: NOT DETECTED

## 2018-09-07 ENCOUNTER — Encounter: Payer: Self-pay | Admitting: Family Medicine

## 2018-09-07 IMAGING — CR DG CHEST 2V
2 series · 2 of 2 positions shown · non-contrast
Comparison: PA and lateral chest x-ray July 04, 2016.

CLINICAL DATA: Fever of unknown origin for the past 6 weeks with no
other complaints. Former smoker. History of a oral malignancy.

EXAM:
CHEST  2 VIEW

[w chest pa]
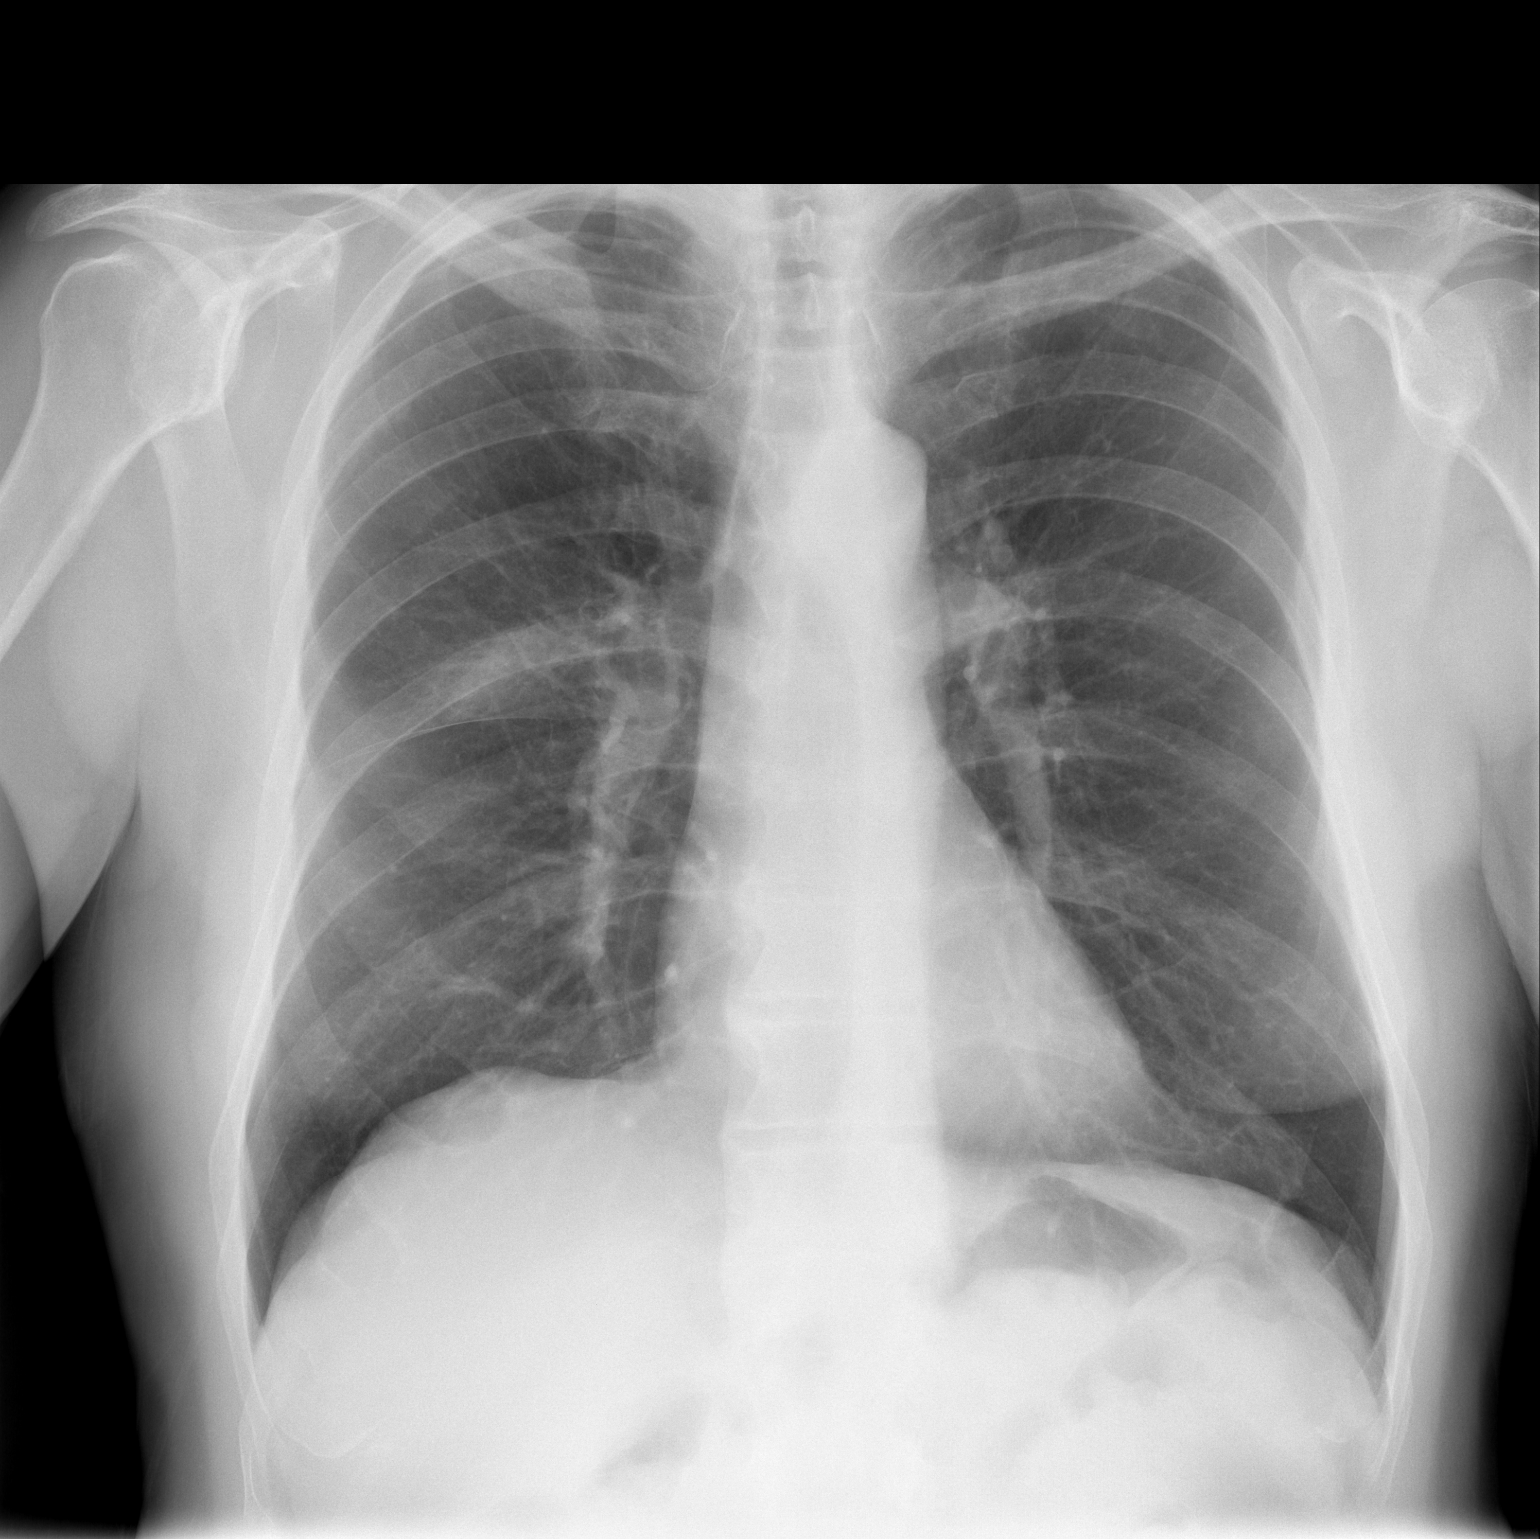

[w chest lat]
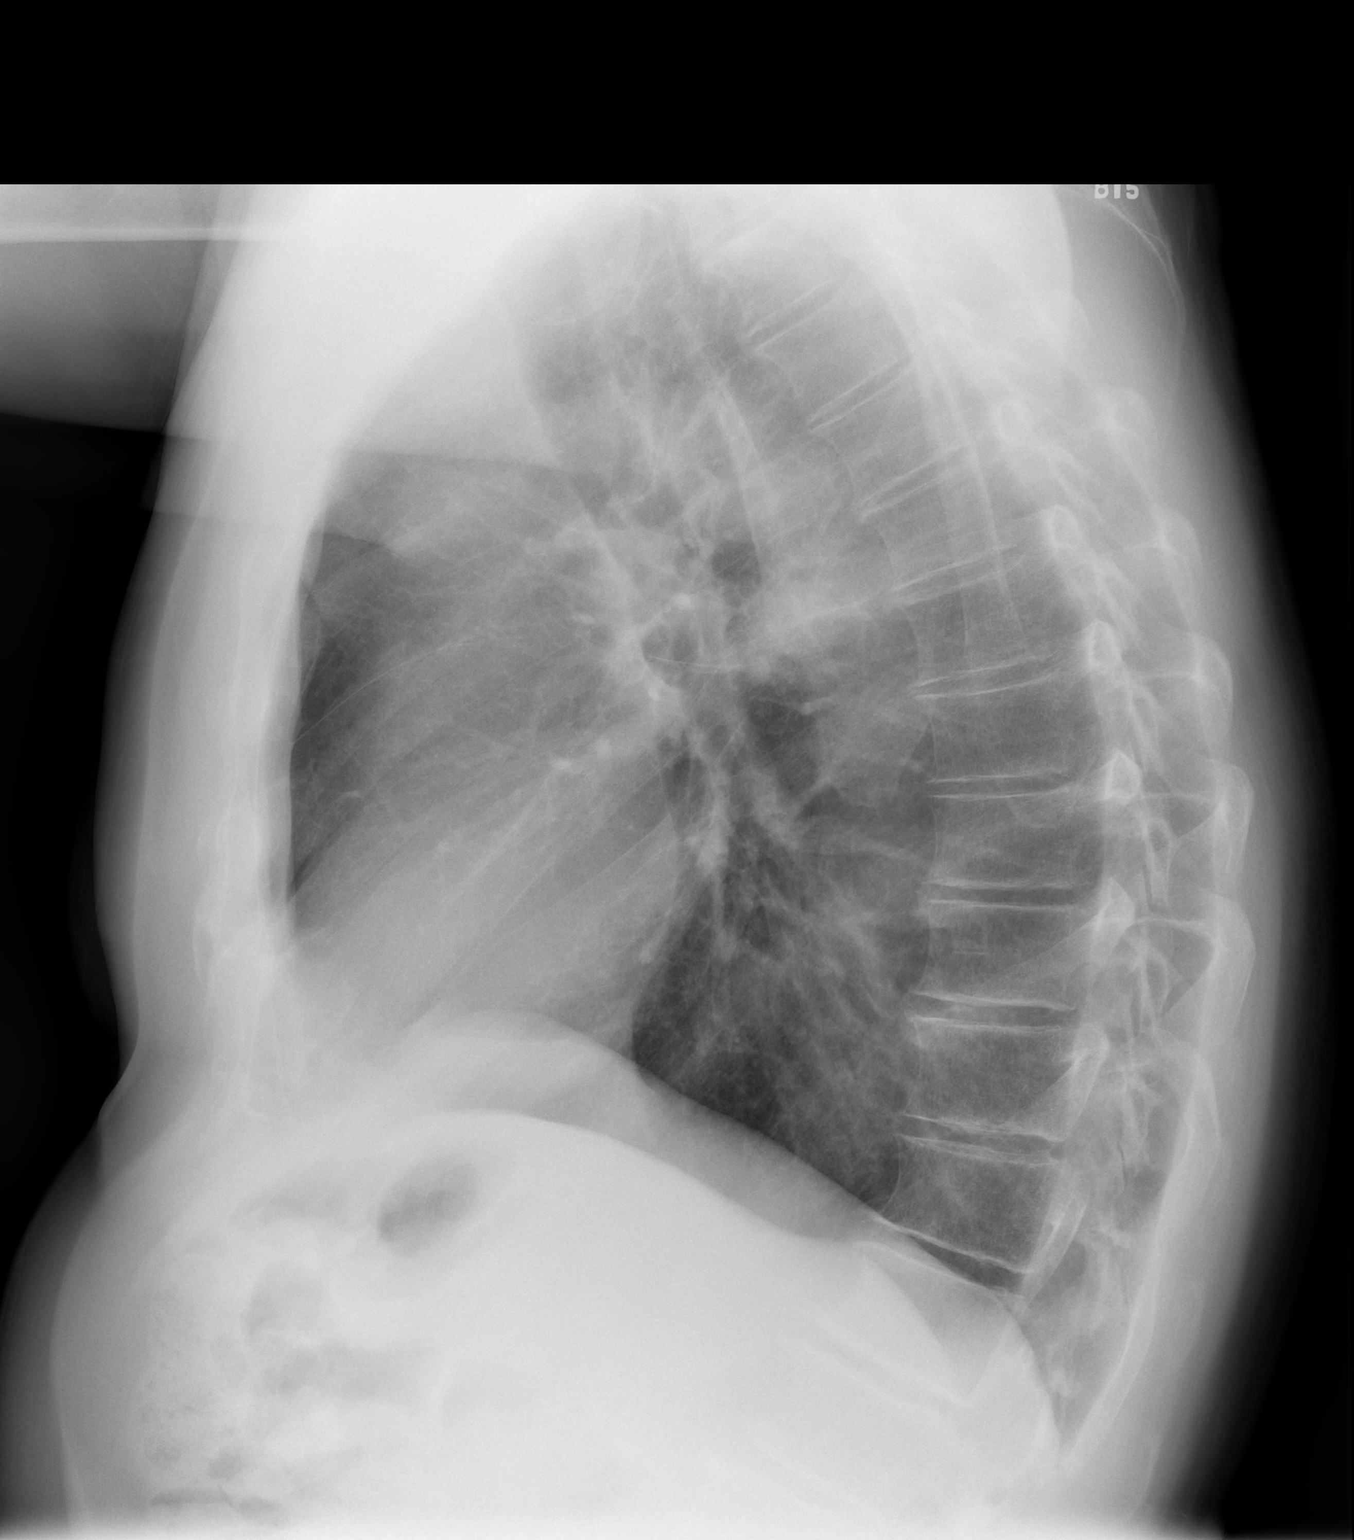

[2 of 2 positions shown; findings below may reference images not displayed]

FINDINGS: The lungs are well-expanded. There is new infiltrate inferiorly in
the right upper lobe. The left lung is clear. There is no pleural
effusion. The heart and pulmonary vascularity are normal. There is
calcification in the wall of the aortic arch. The bony thorax
exhibits no acute abnormality.
IMPRESSION: Infiltrate in the posterior inferior aspect of the right upper lobe
worrisome for pneumonia. Probable underlying chronic bronchitic
changes.

Follow-up radiographs in 2-3 weeks are recommended to assure
complete clearing and exclude malignancy.

Thoracic aortic atherosclerosis.

## 2018-09-13 DIAGNOSIS — K1329 Other disturbances of oral epithelium, including tongue: Secondary | ICD-10-CM | POA: Diagnosis not present

## 2018-10-11 DIAGNOSIS — K149 Disease of tongue, unspecified: Secondary | ICD-10-CM | POA: Diagnosis not present

## 2018-10-23 ENCOUNTER — Encounter: Payer: Self-pay | Admitting: Family Medicine

## 2018-10-23 DIAGNOSIS — Z8639 Personal history of other endocrine, nutritional and metabolic disease: Secondary | ICD-10-CM

## 2018-10-23 DIAGNOSIS — N529 Male erectile dysfunction, unspecified: Secondary | ICD-10-CM

## 2018-10-23 DIAGNOSIS — R799 Abnormal finding of blood chemistry, unspecified: Secondary | ICD-10-CM

## 2018-10-25 MED ORDER — SILDENAFIL CITRATE 20 MG PO TABS: ORAL_TABLET | ORAL | 5 refills | Status: DC

## 2018-10-25 NOTE — Addendum Note (Signed)
Addended by: Lamar Blinks C on: 10/25/2018 04:39 PM   Modules accepted: Orders

## 2018-10-30 ENCOUNTER — Ambulatory Visit (INDEPENDENT_AMBULATORY_CARE_PROVIDER_SITE_OTHER): Payer: Medicare Other

## 2018-10-30 ENCOUNTER — Other Ambulatory Visit (INDEPENDENT_AMBULATORY_CARE_PROVIDER_SITE_OTHER): Payer: Medicare Other

## 2018-10-30 ENCOUNTER — Encounter: Payer: Self-pay | Admitting: Family Medicine

## 2018-10-30 ENCOUNTER — Other Ambulatory Visit: Payer: Self-pay

## 2018-10-30 DIAGNOSIS — Z23 Encounter for immunization: Secondary | ICD-10-CM | POA: Diagnosis not present

## 2018-10-30 DIAGNOSIS — R799 Abnormal finding of blood chemistry, unspecified: Secondary | ICD-10-CM

## 2018-10-30 DIAGNOSIS — Z8639 Personal history of other endocrine, nutritional and metabolic disease: Secondary | ICD-10-CM | POA: Diagnosis not present

## 2018-10-30 LAB — CBC
HCT: 40.6 % (ref 39.0–52.0)
Hemoglobin: 13.5 g/dL (ref 13.0–17.0)
MCHC: 33.1 g/dL (ref 30.0–36.0)
MCV: 92.1 fl (ref 78.0–100.0)
Platelets: 270 10*3/uL (ref 150.0–400.0)
RBC: 4.41 Mil/uL (ref 4.22–5.81)
RDW: 14.1 % (ref 11.5–15.5)
WBC: 5.4 10*3/uL (ref 4.0–10.5)

## 2018-10-30 LAB — BASIC METABOLIC PANEL
BUN: 23 mg/dL (ref 6–23)
CO2: 26 mEq/L (ref 19–32)
Calcium: 9.7 mg/dL (ref 8.4–10.5)
Chloride: 103 mEq/L (ref 96–112)
Creatinine, Ser: 0.86 mg/dL (ref 0.40–1.50)
GFR: 87.36 mL/min (ref >60.00)
Glucose, Bld: 94 mg/dL (ref 70–99)
Potassium: 4.3 mEq/L (ref 3.5–5.1)
Sodium: 138 mEq/L (ref 135–145)

## 2018-10-30 LAB — FERRITIN: Ferritin: 27.8 ng/mL (ref 22.0–322.0)

## 2018-10-31 ENCOUNTER — Other Ambulatory Visit: Payer: Self-pay | Admitting: Family Medicine

## 2018-10-31 DIAGNOSIS — F988 Other specified behavioral and emotional disorders with onset usually occurring in childhood and adolescence: Secondary | ICD-10-CM

## 2018-10-31 MED ORDER — AMPHETAMINE-DEXTROAMPHETAMINE 10 MG PO TABS: 10.0000 mg | ORAL_TABLET | Freq: Two times a day (BID) | ORAL | 0 refills | Status: DC

## 2018-10-31 NOTE — Telephone Encounter (Signed)
Requesting:Adderall Contract:01/12/2017 [id number], needs uds Last Visit:08/09/2018 Next Visit:07/24/2019 Last Refill:07/06/2018  Please Advise

## 2019-01-09 ENCOUNTER — Encounter: Payer: Self-pay | Admitting: Family Medicine

## 2019-01-10 ENCOUNTER — Other Ambulatory Visit: Payer: Self-pay

## 2019-01-10 DIAGNOSIS — M542 Cervicalgia: Secondary | ICD-10-CM

## 2019-01-10 MED ORDER — MELOXICAM 15 MG PO TABS: 15.0000 mg | ORAL_TABLET | Freq: Every day | ORAL | 3 refills | Status: DC | PRN

## 2019-03-06 ENCOUNTER — Other Ambulatory Visit: Payer: Self-pay | Admitting: Family Medicine

## 2019-03-06 DIAGNOSIS — F988 Other specified behavioral and emotional disorders with onset usually occurring in childhood and adolescence: Secondary | ICD-10-CM

## 2019-03-06 MED ORDER — AMPHETAMINE-DEXTROAMPHETAMINE 10 MG PO TABS: 10.0000 mg | ORAL_TABLET | Freq: Two times a day (BID) | ORAL | 0 refills | Status: DC

## 2019-03-09 ENCOUNTER — Ambulatory Visit: Payer: Medicare Other | Attending: Internal Medicine

## 2019-03-09 DIAGNOSIS — Z23 Encounter for immunization: Secondary | ICD-10-CM

## 2019-03-09 NOTE — Progress Notes (Signed)
   Covid-19 Vaccination Clinic  Name:  Jonathan Garrison    MRN: [id number] DOB: May 09, 1945  03/09/2019  Mr. Jonathan Garrison was observed post Covid-19 immunization for 15 minutes without incidence. He was provided with Vaccine Information Sheet and instruction to access the V-Safe system.   Mr. Jonathan Garrison was instructed to call 911 with any severe reactions post vaccine: Marland Kitchen Difficulty breathing  . Swelling of your face and throat  . A fast heartbeat  . A bad rash all over your body  . Dizziness and weakness    Immunizations Administered    Name Date Dose VIS Date Route   Pfizer COVID-19 Vaccine 03/09/2019  2:15 PM 0.3 mL 01/04/2019 Intramuscular   Manufacturer: [place]   Lot: [id number]   [place]: [id number]

## 2019-04-01 DIAGNOSIS — H40013 Open angle with borderline findings, low risk, bilateral: Secondary | ICD-10-CM | POA: Diagnosis not present

## 2019-04-01 DIAGNOSIS — H2513 Age-related nuclear cataract, bilateral: Secondary | ICD-10-CM | POA: Diagnosis not present

## 2019-04-02 ENCOUNTER — Ambulatory Visit: Payer: Medicare Other | Attending: Internal Medicine

## 2019-04-02 DIAGNOSIS — Z23 Encounter for immunization: Secondary | ICD-10-CM | POA: Insufficient documentation

## 2019-04-02 NOTE — Progress Notes (Signed)
   Covid-19 Vaccination Clinic  Name:  Taten Jaffee    MRN: [id number] DOB: 05-21-1945  04/02/2019  Mr. Perren was observed post Covid-19 immunization for 15 minutes without incident. He was provided with Vaccine Information Sheet and instruction to access the V-Safe system.   Mr. Docherty was instructed to call 911 with any severe reactions post vaccine: Marland Kitchen Difficulty breathing  . Swelling of face and throat  . A fast heartbeat  . A bad rash all over body  . Dizziness and weakness   Immunizations Administered    Name Date Dose VIS Date Route   Pfizer COVID-19 Vaccine 04/02/2019  2:51 PM 0.3 mL 01/04/2019 Intramuscular   Manufacturer: [place]   Lot: [id number]   [place]: [id number]

## 2019-04-12 ENCOUNTER — Other Ambulatory Visit: Payer: Self-pay | Admitting: Internal Medicine

## 2019-04-15 ENCOUNTER — Other Ambulatory Visit: Payer: Self-pay | Admitting: Internal Medicine

## 2019-04-15 ENCOUNTER — Other Ambulatory Visit: Payer: Self-pay

## 2019-05-02 DIAGNOSIS — K148 Other diseases of tongue: Secondary | ICD-10-CM | POA: Diagnosis not present

## 2019-05-03 DIAGNOSIS — L603 Nail dystrophy: Secondary | ICD-10-CM | POA: Diagnosis not present

## 2019-05-03 DIAGNOSIS — L03039 Cellulitis of unspecified toe: Secondary | ICD-10-CM | POA: Diagnosis not present

## 2019-05-03 DIAGNOSIS — L03011 Cellulitis of right finger: Secondary | ICD-10-CM | POA: Diagnosis not present

## 2019-05-03 DIAGNOSIS — L608 Other nail disorders: Secondary | ICD-10-CM | POA: Diagnosis not present

## 2019-05-06 DIAGNOSIS — Z01812 Encounter for preprocedural laboratory examination: Secondary | ICD-10-CM | POA: Diagnosis not present

## 2019-05-06 DIAGNOSIS — Z20822 Contact with and (suspected) exposure to covid-19: Secondary | ICD-10-CM | POA: Diagnosis not present

## 2019-05-08 DIAGNOSIS — H2511 Age-related nuclear cataract, right eye: Secondary | ICD-10-CM | POA: Diagnosis not present

## 2019-05-08 DIAGNOSIS — H2512 Age-related nuclear cataract, left eye: Secondary | ICD-10-CM | POA: Diagnosis not present

## 2019-05-08 DIAGNOSIS — K219 Gastro-esophageal reflux disease without esophagitis: Secondary | ICD-10-CM | POA: Diagnosis not present

## 2019-05-08 DIAGNOSIS — Z7982 Long term (current) use of aspirin: Secondary | ICD-10-CM | POA: Diagnosis not present

## 2019-05-08 DIAGNOSIS — H9190 Unspecified hearing loss, unspecified ear: Secondary | ICD-10-CM | POA: Diagnosis not present

## 2019-05-08 DIAGNOSIS — H25812 Combined forms of age-related cataract, left eye: Secondary | ICD-10-CM | POA: Diagnosis not present

## 2019-05-14 ENCOUNTER — Other Ambulatory Visit: Payer: Self-pay

## 2019-05-14 ENCOUNTER — Encounter: Payer: Self-pay | Admitting: Dermatology

## 2019-05-14 ENCOUNTER — Ambulatory Visit (INDEPENDENT_AMBULATORY_CARE_PROVIDER_SITE_OTHER): Payer: Medicare Other | Admitting: Dermatology

## 2019-05-14 DIAGNOSIS — Z1283 Encounter for screening for malignant neoplasm of skin: Secondary | ICD-10-CM | POA: Diagnosis not present

## 2019-05-14 DIAGNOSIS — L821 Other seborrheic keratosis: Secondary | ICD-10-CM | POA: Diagnosis not present

## 2019-05-14 DIAGNOSIS — D229 Melanocytic nevi, unspecified: Secondary | ICD-10-CM

## 2019-05-14 NOTE — Patient Instructions (Addendum)
Jonathan Garrison showed me photographs of a recurring same location red inflamed bump on the front central scalp.  Today he and I agree that there is nothing there.  This is unlikely to be anything malignant but he could have small recurring folliculitis or a small cyst which right now is not appreciated.  Should it recur he will try and see me at that time.  The remainder of his examination from feet to scalp show no atypical moles nor skin cancer.  He has several new and growing benign keratoses particularly on the chest; all of these are typical with dermoscopy.  On his left mid shin one of his keratoses is more pink and slightly puffy; should this grow or bleed he will return for a biopsy.  On an unrelated issue we did discuss his one-time noticing blood after sexual intercourse; this did not recur with masturbation.  He is already scheduled to see a urologist and he may bring up the possibility of a urethral caruncle.  If his skin remains this clear, routine follow-up in 1 year.

## 2019-05-16 DIAGNOSIS — R310 Gross hematuria: Secondary | ICD-10-CM | POA: Diagnosis not present

## 2019-05-16 DIAGNOSIS — N401 Enlarged prostate with lower urinary tract symptoms: Secondary | ICD-10-CM | POA: Diagnosis not present

## 2019-05-16 DIAGNOSIS — R3911 Hesitancy of micturition: Secondary | ICD-10-CM | POA: Diagnosis not present

## 2019-05-16 DIAGNOSIS — R361 Hematospermia: Secondary | ICD-10-CM | POA: Diagnosis not present

## 2019-05-17 ENCOUNTER — Encounter: Payer: Self-pay | Admitting: Family Medicine

## 2019-05-17 DIAGNOSIS — Z9109 Other allergy status, other than to drugs and biological substances: Secondary | ICD-10-CM

## 2019-05-18 ENCOUNTER — Encounter: Payer: Self-pay | Admitting: Dermatology

## 2019-05-18 MED ORDER — FLUTICASONE PROPIONATE 50 MCG/ACT NA SUSP: NASAL | 11 refills | Status: DC

## 2019-05-18 NOTE — Progress Notes (Signed)
   Follow-Up Visit   Subjective  Jonathan Garrison is a 74 y.o. male who presents for the following: Annual Exam (brown spots on chest and spots on scalp. Seen Dr. [name]).  growths Location: Chest Duration: Few years Quality: Several increased size Associated Signs/Symptoms: Modifying Factors:  Severity:  Timing: Context:   The following portions of the chart were reviewed this encounter and updated as appropriate:     Objective  Well appearing patient in no apparent distress; mood and affect are within normal limits.  A full examination was performed including scalp, head, eyes, ears, nose, lips, neck, chest, axillae, abdomen, back, buttocks, bilateral upper extremities, bilateral lower extremities, hands, feet, fingers, toes, fingernails, and toenails. All findings within normal limits unless otherwise noted below.  Mister Rahmere Ficca showed me photographs of a recurring same location red inflamed bump on the front central scalp.  Today he and I agree that there is nothing there.  This is unlikely to be anything malignant but he could have small recurring folliculitis or a small cyst which right now is not appreciated.  Should it recur he will try and see me at that time.  The remainder of his examination from feet to scalp show no atypical moles nor skin cancer.  He has several new and growing benign keratoses particularly on the chest; all of these are typical with dermoscopy.  On his left mid shin one of his keratoses is more pink and slightly puffy; should this grow or bleed he will return for a biopsy.  On an unrelated issue we did discuss his one-time noticing blood after sexual intercourse; this did not recur with masturbation.  He is already scheduled to see a urologist and he may bring up the possibility of a urethral caruncle.  If his skin remains this clear, routine follow-up in 1 year.  Assessment & Plan

## 2019-05-21 DIAGNOSIS — R972 Elevated prostate specific antigen [PSA]: Secondary | ICD-10-CM | POA: Diagnosis not present

## 2019-05-21 DIAGNOSIS — R310 Gross hematuria: Secondary | ICD-10-CM | POA: Diagnosis not present

## 2019-05-21 LAB — PSA: PSA: 1

## 2019-05-22 ENCOUNTER — Other Ambulatory Visit: Payer: Self-pay | Admitting: Internal Medicine

## 2019-05-23 ENCOUNTER — Encounter: Payer: Self-pay | Admitting: Family Medicine

## 2019-05-24 ENCOUNTER — Telehealth: Payer: Self-pay | Admitting: Internal Medicine

## 2019-05-24 NOTE — Telephone Encounter (Signed)
See MyChart message from today as well.  Patient advised that we will have [name] look at his myChart message and decide if he needs an office visit.

## 2019-05-24 NOTE — Telephone Encounter (Signed)
Patient called states he had a BM with blood and mucus on 05/20/19 was taking antibiotics for a tooth not sure if that effected his stomach no further bleeding since then.

## 2019-05-31 ENCOUNTER — Telehealth: Payer: Self-pay | Admitting: Internal Medicine

## 2019-05-31 NOTE — Telephone Encounter (Signed)
Patient called to follow up on previous message in reference to CT scan

## 2019-05-31 NOTE — Telephone Encounter (Signed)
Pt aware of Dr. Blanch Media recommendation regarding colonoscopy. Pt will call back when he is back home and has his calendar to schedule.

## 2019-06-06 ENCOUNTER — Encounter: Payer: Self-pay | Admitting: Internal Medicine

## 2019-06-19 DIAGNOSIS — R310 Gross hematuria: Secondary | ICD-10-CM | POA: Diagnosis not present

## 2019-06-19 DIAGNOSIS — N5201 Erectile dysfunction due to arterial insufficiency: Secondary | ICD-10-CM | POA: Diagnosis not present

## 2019-07-03 ENCOUNTER — Encounter: Payer: Self-pay | Admitting: Family Medicine

## 2019-07-03 ENCOUNTER — Ambulatory Visit: Payer: Medicare Other | Admitting: Internal Medicine

## 2019-07-03 DIAGNOSIS — F988 Other specified behavioral and emotional disorders with onset usually occurring in childhood and adolescence: Secondary | ICD-10-CM

## 2019-07-04 MED ORDER — AMPHETAMINE-DEXTROAMPHETAMINE 10 MG PO TABS: 10.0000 mg | ORAL_TABLET | Freq: Two times a day (BID) | ORAL | 0 refills | Status: DC

## 2019-07-05 ENCOUNTER — Telehealth: Payer: Self-pay | Admitting: Family Medicine

## 2019-07-05 MED ORDER — AMPHETAMINE-DEXTROAMPHETAMINE 20 MG PO TABS: 20.0000 mg | ORAL_TABLET | Freq: Two times a day (BID) | ORAL | 0 refills | Status: DC

## 2019-07-05 NOTE — Addendum Note (Signed)
Addended by: Lamar Blinks C on: 07/05/2019 10:02 AM   Modules accepted: Orders

## 2019-07-05 NOTE — Telephone Encounter (Signed)
Per Pharmacy states they have received two prescription of:  amphetamine-dextroamphetamine (ADDERALL) 20 MG tablet    amphetamine-dextroamphetamine (ADDERALL) 10 MG tablet    Please advise , which is the correct dosage   [id number] [address] [place], [place] [facility] [address] [facility] [address] Phone:  [phone]  Fax:  [phone]

## 2019-07-16 ENCOUNTER — Other Ambulatory Visit: Payer: Self-pay

## 2019-07-16 ENCOUNTER — Ambulatory Visit (AMBULATORY_SURGERY_CENTER): Payer: Self-pay

## 2019-07-16 VITALS — Ht 74.0 in | Wt 200.4 lb

## 2019-07-16 DIAGNOSIS — Z1211 Encounter for screening for malignant neoplasm of colon: Secondary | ICD-10-CM

## 2019-07-16 MED ORDER — NA SULFATE-K SULFATE-MG SULF 17.5-3.13-1.6 GM/177ML PO SOLN: 1.0000 | Freq: Once | ORAL | 0 refills | Status: AC

## 2019-07-16 NOTE — Progress Notes (Signed)
No allergies to soy or egg Pt is not on blood thinners or diet pills Denies issues with sedation/intubation Denies atrial flutter/fib Denies constipation   Emmi instructions given to pt  Pt is aware of Covid safety and care partner requirements.  Pt is requesting Suprep vs Sutab.  Changed at his request.  He is aware that cost may be high.

## 2019-07-19 ENCOUNTER — Encounter: Payer: Self-pay | Admitting: Family Medicine

## 2019-07-22 NOTE — Progress Notes (Addendum)
[place] at [facility] [address], [place], [place] [address] [phone]  Date:  07/24/2019   Name:  Jonathan Garrison   DOB:  26-Jun-1945   MRN:  [id number]  PCP:  Darreld Mclean, MD    Chief Complaint: Annual Exam   History of Present Illness:  Jonathan Garrison is a 74 y.o. very pleasant male patient who presents with the following:  Patient here today for periodic checkup Last seen by myself in July 2020 for concern of insect bites  History of ADD, B12 deficiency, osteopenia, oral cancer, increased PSA His oral cancer is treated per the [facility] dental school-he was last seen in April, all appears stable He saw his urologist, Dr. Diona Fanti, in May.  He had some blood in his semen but this turned out to be benign   Colon cancer screening is due next year- he actually is updating next week as he had an episode of bleeding COVID-19 vaccine complete Can suggest Shingrix- he plans to do this soon PSA completed April 2021, otherwise can do labs today  Adderall 20 twice daily Baby aspirin Revatio Iron supplement   He enjoys kayaking and is doing a lot more of this recently He and his GF recently broke up about a month ago.  This has been really hard on him. They had been together for about 2.5 years.  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    Past  Medical History:  Diagnosis Date   ADD (attention deficit disorder) without hyperactivity    Allergy    Anemia    no treatment   Anxiety    Arthralgia    many joints, managed with vitamins/herbs and ibuprofen   Arthritis    Chest pain    Colitis 1986   single attack (believes was ulcerative)   Depression    Eczema    Esophageal stricture    GERD (gastroesophageal reflux disease)    Hemorrhoids    Lichen planus    Neuromuscular disorder ([place])    POLIO AGE 6   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   plans for surgery 06/2010   Osteoporosis    hips   Pleurisy 1984   Polio age 65   Umbilical hernia     Past Surgical History:  Procedure Laterality Date   COLONOSCOPY  2012   LEG SURGERY  when in 6th grade   L leg, bone spur removal (just above knee, laterally)   TONGUE SURGERY     for tongue cancer   UPPER GASTROINTESTINAL ENDOSCOPY      Social History   Tobacco Use   Smoking status: Former Smoker    Types: Pipe, Cigarettes    Quit date: 01/25/1976    Years since quitting: 43.5   Smokeless tobacco: Never Used   Tobacco comment: Quit smoking 35 years ago  Vaping Use   Vaping Use: Never used  Substance Use Topics   Alcohol use: Yes    Alcohol/week: 7.0 - 14.0 standard drinks    Types: 7 - 14 Glasses of wine per week   Drug use: No    Family History  Problem Relation Age of Onset   Hypertension Mother    Diabetes Mother    Hyperlipidemia Mother    Hypertension Father    Benign prostatic hyperplasia Father    Heart disease Father    Lichen planus Sister    Cancer Sister        ?spot on chest? contained   Diabetes Maternal Grandfather    Colon cancer Neg Hx    Esophageal cancer Neg Hx    Stomach cancer Neg Hx    Pancreatic cancer Neg Hx    Liver disease Neg Hx    Colon polyps Neg Hx    Rectal cancer Neg Hx     Allergies  Allergen Reactions   Penicillins Other (See Comments)    REACTION: ?    Medication list has  been reviewed and updated.  Current Outpatient Medications on File Prior to Visit  Medication Sig Dispense Refill   amphetamine-dextroamphetamine (ADDERALL) 20 MG tablet Take 1 tablet (20 mg total) by mouth 2 (two) times daily. 60 tablet 0   aspirin 81 MG tablet Take 81 mg by mouth daily.     b complex vitamins tablet Take 0.5 tablets by mouth daily.       Biotin 5000 MCG CAPS Take 1 capsule by mouth 2 (two) times daily.     Calcium-Magnesium-Zinc 294-76-5 MG TABS Take by mouth. 1/2 tablet daily     chlorhexidine (PERIDEX) 0.12 % solution 15 mLs daily.     chlorpheniramine (CHLOR-TRIMETON) 4 MG tablet Take 2 mg by mouth every 6 (six) hours as needed.      Cholecalciferol (VITAMIN D3) 2000 UNITS capsule Take 2,000 Units by mouth daily.     ferrous sulfate 324 (65 Fe) MG TBEC Take one daily or every other day for iron deficiency 90 tablet 0   fluticasone (FLONASE) 50 MCG/ACT nasal spray SHAKE LIQUID AND USE 2 SPRAYS IN EACH NOSTRIL DAILY 16 g 11   glucosamine-chondroitin 500-400 MG tablet Take 1 tablet by mouth 2 (two) times daily.       meloxicam (MOBIC) 15 MG tablet Take 1 tablet (15 mg total) by mouth daily as needed. 90 tablet 3   Multiple Vitamins-Minerals (MULTIVITAMIN WITH MINERALS) tablet Take 1 tablet by mouth daily.       NON FORMULARY ALJ -herbal decongestant  Takes as needed     omeprazole (PRILOSEC) 40 MG capsule Take by mouth.     sildenafil (REVATIO) 20 MG tablet Take 2-3 tablets one hour prior to intercourse 60 tablet 5   Zn-Pyg Afri-Nettle-Saw Palmet (SAW PALMETTO COMPLEX PO) Take 1 tablet by mouth 2 (two) times daily.     No current facility-administered medications on file prior to visit.    Review of Systems:  As per HPI- otherwise negative. Pulse Readings from Last 3 Encounters:  07/24/19 80  07/24/19 80  08/09/18 86     Physical Examination: Vitals:   07/24/19 1021 07/24/19 1025  BP: (!) 160/80 136/80  Pulse: 80   Resp: 16   Temp: 98.2  F (36.8 C)   SpO2: 98%    Vitals:   07/24/19 1021  Weight: 198 lb 3.2 oz (89.9 kg)  Height: 6\' 2"  (1.88 m)  Body mass index is 25.45 kg/m. Ideal Body Weight: Weight in (lb) to have BMI = 25: 194.3  GEN: no acute distress.  Tall build, normal weight  Looks well  HEENT: Atraumatic, Normocephalic.  Ears and Nose: No external deformity. CV: RRR, No M/G/R. No JVD. No thrill. No extra heart sounds. PULM: CTA B, no wheezes, crackles, rhonchi. No retractions. No resp. distress. No accessory muscle use. ABD: S, NT, ND, +BS. No rebound. No HSM. EXTR: No c/c/e PSYCH: Normally interactive. Conversant.  Bilateral cerumen impaction resolved via irrigation- normal TM bilaterally after procedure. 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  Environmental allergies  Screening for diabetes mellitus - Plan: Comprehensive metabolic panel, Hemoglobin A1c  Screening for hyperlipidemia - Plan: Lipid panel  Anemia due to vitamin B12 deficiency, unspecified B12 deficiency type - Plan: CBC, Vitamin B12  Hyperglycemia - Plan: Hemoglobin A1c  History of iron deficiency - Plan: Ferritin  Hypogonadism in male - Plan: Testosterone Total,Free,Bio, Males-([facility])  Here today for a follow-up visit BP is under ok control today Pt understands it is a bit late in the day for a T level, would like to do anyway for convenience  Encouraged shingrix Otherwise will follow-up pending his labs, plan for recheck in 6 months  This visit occurred during the SARS-CoV-2 public health emergency.  Safety protocols were in place, including screening questions prior to the visit, additional usage of staff PPE, and extensive cleaning of exam room while observing appropriate contact time as indicated for disinfecting solutions.    Signed Lamar Blinks, MD  Received his labs so far- message to pt  Results for orders placed or performed in visit on  07/24/19  CBC  Result Value Ref Range   WBC 4.4 4.0 - 10.5 K/uL   RBC 4.31 4.22 - 5.81 Mil/uL   Platelets 265.0 150 - 400 K/uL   Hemoglobin 13.2 13.0 - 17.0 g/dL   HCT 39.9 39 - 52 %   MCV 92.6 78.0 - 100.0 fl   MCHC 33.2 30.0 - 36.0 g/dL   RDW 14.4 11.5 - 15.5 %  Comprehensive metabolic panel  Result Value Ref Range   Sodium 140 135 - 145 mEq/L   Potassium 5.0 3.5 - 5.1 mEq/L   Chloride 105 96 - 112 mEq/L   CO2 28 19 - 32 mEq/L   Glucose, Bld 90 70 - 99 mg/dL   BUN 31 (H) 6 - 23 mg/dL   Creatinine, Ser 0.89 0.40 - 1.50 mg/dL   Total Bilirubin 1.0 0.2 - 1.2 mg/dL   Alkaline Phosphatase 41 39 - 117 U/L   AST 49 (H) 0 - 37 U/L   ALT 37 0 - 53 U/L   Total Protein 6.6 6.0 - 8.3 g/dL   Albumin 4.5 3.5 - 5.2 g/dL   GFR 83.80 >60.00 mL/min   Calcium 10.0 8.4 - 10.5 mg/dL  Hemoglobin A1c  Result Value Ref Range   Hgb A1c MFr Bld 5.3 4.6 - 6.5 %  Lipid panel  Result Value Ref Range   Cholesterol 160 0 - 200 mg/dL   Triglycerides 38.0 0 - 149 mg/dL   HDL 79.50 >39.00 mg/dL   VLDL 7.6 0.0 - 40.0 mg/dL   LDL Cholesterol 73 0 - 99 mg/dL   Total CHOL/HDL Ratio 2    NonHDL 80.12   Vitamin B12  Result Value Ref Range   Vitamin B-12 358 211 - 911 pg/mL  Ferritin  Result Value  Ref Range   Ferritin 25.3 22.0 - 322.0 ng/mL

## 2019-07-23 NOTE — Progress Notes (Signed)
Subjective:   Jonathan Garrison is a 75 y.o. male who presents for Medicare Annual/Subsequent preventive examination.  Review of Systems     Cardiac Risk Factors include: advanced age (>72men, >72 women);male gender     Objective:    Today's Vitals   07/24/19 0955  BP: (!) 160/80  Pulse: 80  Temp: 98.2 F (36.8 C)  TempSrc: Oral  SpO2: 98%  Weight: 198 lb 3.2 oz (89.9 kg)  Height: 6\' 2"  (1.88 m)   Body mass index is 25.45 kg/m.  Advanced Directives 07/24/2019 06/06/2014 08/15/2013  Does Patient Have a Medical Advance Directive? No No Patient does not have advance directive  Would patient like information on creating a medical advance directive? Yes (MAU/Ambulatory/Procedural Areas - Information given) No - patient declined information -  Pre-existing out of facility DNR order (yellow form or pink MOST form) - - No    Current Medications (verified) Outpatient Encounter Medications as of 07/24/2019  Medication Sig  . amphetamine-dextroamphetamine (ADDERALL) 20 MG tablet Take 1 tablet (20 mg total) by mouth 2 (two) times daily.  Marland Kitchen aspirin 81 MG tablet Take 81 mg by mouth daily.  Marland Kitchen b complex vitamins tablet Take 0.5 tablets by mouth daily.    . Biotin 5000 MCG CAPS Take 1 capsule by mouth 2 (two) times daily.  . Calcium-Magnesium-Zinc 167-83-8 MG TABS Take by mouth. 1/2 tablet daily  . chlorhexidine (PERIDEX) 0.12 % solution 15 mLs daily.  . chlorpheniramine (CHLOR-TRIMETON) 4 MG tablet Take 2 mg by mouth every 6 (six) hours as needed.   . Cholecalciferol (VITAMIN D3) 2000 UNITS capsule Take 2,000 Units by mouth daily.  . ferrous sulfate 324 (65 Fe) MG TBEC Take one daily or every other day for iron deficiency  . fluticasone (FLONASE) 50 MCG/ACT nasal spray SHAKE LIQUID AND USE 2 SPRAYS IN EACH NOSTRIL DAILY  . glucosamine-chondroitin 500-400 MG tablet Take 1 tablet by mouth 2 (two) times daily.    . meloxicam (MOBIC) 15 MG tablet Take 1 tablet (15 mg total) by mouth daily as  needed.  . Multiple Vitamins-Minerals (MULTIVITAMIN WITH MINERALS) tablet Take 1 tablet by mouth daily.    . NON FORMULARY ALJ -herbal decongestant  Takes as needed  . omeprazole (PRILOSEC) 40 MG capsule Take by mouth.  . sildenafil (REVATIO) 20 MG tablet Take 2-3 tablets one hour prior to intercourse  . Zn-Pyg Afri-Nettle-Saw Palmet (SAW PALMETTO COMPLEX PO) Take 1 tablet by mouth 2 (two) times daily.  . [DISCONTINUED] amphetamine-dextroamphetamine (ADDERALL) 10 MG tablet Take 1 tablet (10 mg total) by mouth 2 (two) times daily. As needed for inattention (Patient not taking: Reported on 07/24/2019)   No facility-administered encounter medications on file as of 07/24/2019.    Allergies (verified) Penicillins   History: Past Medical History:  Diagnosis Date  . ADD (attention deficit disorder) without hyperactivity   . Allergy   . Anemia    no treatment  . Anxiety   . Arthralgia    many joints, managed with vitamins/herbs and ibuprofen  . Arthritis   . Chest pain   . Colitis 1986   single attack (believes was ulcerative)  . Depression   . Eczema   . Esophageal stricture   . GERD (gastroesophageal reflux disease)   . Hemorrhoids   . Lichen planus   . Neuromuscular disorder ([place])    POLIO AGE 68  . 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 Osteoporosis    hips  .  Pleurisy 1984  . Polio age 31  . Umbilical hernia    Past Surgical History:  Procedure Laterality Date  . COLONOSCOPY  2012  . LEG SURGERY  when in 6th grade   L leg, bone spur removal (just above knee, laterally)  . TONGUE SURGERY     for tongue cancer  . UPPER GASTROINTESTINAL ENDOSCOPY     Family History  Problem Relation Age of Onset  . Hypertension Mother   . Diabetes Mother   . Hyperlipidemia Mother   . Hypertension Father   . Benign prostatic hyperplasia Father   . Heart disease Father   . Lichen planus Sister   . Cancer Sister        ?spot on chest? contained  . Diabetes Maternal  Grandfather   . Colon cancer Neg Hx   . Esophageal cancer Neg Hx   . Stomach cancer Neg Hx   . Pancreatic cancer Neg Hx   . Liver disease Neg Hx   . Colon polyps Neg Hx   . Rectal cancer Neg Hx    Social History   Socioeconomic History  . Marital status: Significant Other    Spouse name: Not on file  . Number of children: Not on file  . Years of education: Not on file  . Highest education level: Not on file  Occupational History  . 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 Smoking status: Former Smoker    Types: Pipe, Cigarettes    Quit date: 01/25/1976    Years since quitting: 43.5  . Smokeless tobacco: Never Used  . Tobacco comment: Quit smoking 35 years ago  Vaping Use  . Vaping Use: Never used  Substance and Sexual Activity  . Alcohol use: Yes    Alcohol/week: 7.0 - 14.0 standard drinks    Types: 7 - 14 Glasses of wine per week  . Drug use: No  . Sexual activity: Not on file  Other Topics Concern  . Not on file  Social History Narrative  . Not on file   Social Determinants of Health   Financial Resource Strain: Low Risk   . Difficulty of Paying Living Expenses: Not hard at all  Food Insecurity: No Food Insecurity  . Worried About Charity fundraiser in the Last Year: Never true  . Ran Out of Food in the Last Year: Never true  Transportation Needs: No Transportation Needs  . Lack of Transportation (Medical): No  . Lack of Transportation (Non-Medical): No  Physical Activity:   . Days of Exercise per Week:   . Minutes of Exercise per Session:   Stress:   . Feeling of Stress :   Social Connections:   . Frequency of Communication with Friends and Family:   . Frequency of Social Gatherings with Friends and Family:   . Attends Religious Services:   . Active Member of Clubs or Organizations:   . Attends Archivist Meetings:   Marland Kitchen Marital Status:     Tobacco Counseling Counseling given: Not  Answered Comment: Quit smoking 35 years ago   Clinical Intake: Pain : No/denies pain    Activities of Daily Living In your present state of health, do you have any difficulty performing the following activities: 07/24/2019  Hearing? N  Vision? N  Difficulty concentrating or making decisions? N  Walking or climbing stairs? N  Dressing or bathing? N  Doing errands, shopping? N  Conservation officer, nature and  eating ? N  Using the Toilet? N  In the past six months, have you accidently leaked urine? N  Do you have problems with loss of bowel control? N  Managing your Medications? N  Managing your Finances? N  Housekeeping or managing your Housekeeping? N  Some recent data might be hidden    Patient Care Team: Copland, Gay Filler, MD as PCP - General (Family Medicine) Irene Shipper, MD (Gastroenterology) Phylliss Bob, MD (Orthopedic Surgery)  Indicate any recent Medical Services you may have received from other than [facility] providers in the past year (date may be approximate).     Assessment:   This is a routine wellness examination for Bartosz.  Dietary issues and exercise activities discussed: Current Exercise Habits: Home exercise routine, Type of exercise: calisthenics;walking, Time (Minutes): 30, Frequency (Times/Week): 7, Weekly Exercise (Minutes/Week): 210, Intensity: Mild, Exercise limited by: None identified Diet (meal preparation, eat out, water intake, caffeinated beverages, dairy products, fruits and vegetables): 24 hr recall Breakfast: healthy cereal, nuts, berries, juice Lunch: PB and fruit Dinner: [employer]: low fat yogurt  Goals    . Maintain healthy active lifestyle.      Depression Screen PHQ 2/9 Scores 07/24/2019 01/12/2017 01/07/2016 12/09/2015 06/18/2015 04/09/2015 01/22/2015  PHQ - 2 Score 1 1 0 0 0 0 0    Fall Risk Fall Risk  07/24/2019 08/24/2018 01/12/2017 01/07/2016 12/09/2015  Falls in the past year? 0 (No Data) No No Yes  Comment - Emmi Telephone Survey:  data to providers prior to load - - -  Number falls in past yr: 0 (No Data) - - 1  Comment - Emmi Telephone Survey Actual Response =  - - -  Injury with Fall? 0 - - - No  Follow up Education provided;Falls prevention discussed - - - -    Any stairs in or around the home? Yes  If so, are there any without handrails? No  Home free of loose throw rugs in walkways, pet beds, electrical cords, etc? Yes  Adequate lighting in your home to reduce risk of falls? Yes  Gait steady and fast without use of assistive device  Cognitive Function: Ad8 score reviewed for issues:  Issues making decisions:no  Less interest in hobbies / activities:no  Repeats questions, stories (family complaining):no  Trouble using ordinary gadgets (microwave, computer, phone):no  Forgets the month or year: no  Mismanaging finances: no  Remembering appts:no Daily problems with thinking and/or memory:no Ad8 score is=0         Immunizations Immunization History  Administered Date(s) Administered  . Fluad Quad(high Dose 65+) 10/30/2018  . Influenza, High Dose Seasonal PF 12/09/2015, 10/17/2016, 12/29/2017  . Influenza,inj,Quad PF,6+ Mos 12/19/2014  . Influenza-Unspecified 11/07/2013  . PFIZER SARS-COV-2 Vaccination 03/09/2019, 04/02/2019  . Pneumococcal Conjugate-13 04/15/2014  . Pneumococcal Polysaccharide-23 12/09/2015  . Td 01/12/2017  . Tdap 01/09/2007    TDAP status: Up to date Flu Vaccine status: Up to date Pneumococcal vaccine status: Up to date Covid-19 vaccine status: Completed vaccines   Screening Tests Health Maintenance  Topic Date Due  . INFLUENZA VACCINE  08/25/2019  . COLONOSCOPY  03/01/2020  . TETANUS/TDAP  01/13/2027  . COVID-19 Vaccine  Completed  . Hepatitis C Screening  Completed  . PNA vac Low Risk Adult  Completed    Health Maintenance  There are no preventive care reminders to display for this patient.  Colorectal cancer screening: Completed 03/01/10. Repeat every  10 years  Lung Cancer Screening: (Low Dose CT Chest  recommended if Age 25-80 years, 68 pack-year currently smoking OR have quit w/in 15years.) does not qualify.     Additional Screening:  Hepatitis C Screening: does qualify; Completed 12/19/14  Vision Screening: Recommended annual ophthalmology exams for early detection of glaucoma and other disorders of the eye. Is the patient up to date with their annual eye exam?  Yes  Who is the provider or what is the name of the office in which the patient attends annual eye exams? [facility]  Dental Screening: Recommended annual dental exams for proper oral hygiene  Community Resource Referral / Chronic Care Management: CRR required this visit?  No   CCM required this visit?  No      Plan:    Please schedule your next medicare wellness visit with me in 1 yr.  Continue to eat heart healthy diet (full of fruits, vegetables, whole grains, lean protein, water--limit salt, fat, and sugar intake) and increase physical activity as tolerated.  Continue doing brain stimulating activities (puzzles, reading, adult coloring books, staying active) to keep memory sharp.   Bring a copy of your living will and/or healthcare power of attorney to your next office visit.    I have personally reviewed and noted the following in the patient's chart:   . Medical and social history . Use of alcohol, tobacco or illicit drugs  . Current medications and supplements . Functional ability and status . Nutritional status . Physical activity . Advanced directives . List of other physicians . Hospitalizations, surgeries, and ER visits in previous 12 months . Vitals . Screenings to include cognitive, depression, and falls . Referrals and appointments  In addition, I have reviewed and discussed with patient certain preventive protocols, quality metrics, and best practice recommendations. A written personalized care plan for preventive services as well as  general preventive health recommendations were provided to patient.     Naaman Plummer [place], [place]   07/24/2019

## 2019-07-24 ENCOUNTER — Encounter: Payer: Self-pay | Admitting: Family Medicine

## 2019-07-24 ENCOUNTER — Other Ambulatory Visit: Payer: Self-pay

## 2019-07-24 ENCOUNTER — Ambulatory Visit (INDEPENDENT_AMBULATORY_CARE_PROVIDER_SITE_OTHER): Payer: Medicare Other | Admitting: *Deleted

## 2019-07-24 ENCOUNTER — Ambulatory Visit (INDEPENDENT_AMBULATORY_CARE_PROVIDER_SITE_OTHER): Payer: Medicare Other | Admitting: Family Medicine

## 2019-07-24 ENCOUNTER — Encounter: Payer: Self-pay | Admitting: *Deleted

## 2019-07-24 VITALS — BP 160/80 | HR 80 | Temp 98.2°F | Ht 74.0 in | Wt 198.2 lb

## 2019-07-24 VITALS — BP 136/80 | HR 80 | Temp 98.2°F | Resp 16 | Ht 74.0 in | Wt 198.2 lb

## 2019-07-24 DIAGNOSIS — Z0000 Encounter for general adult medical examination without abnormal findings: Secondary | ICD-10-CM

## 2019-07-24 DIAGNOSIS — E291 Testicular hypofunction: Secondary | ICD-10-CM | POA: Diagnosis not present

## 2019-07-24 DIAGNOSIS — Z13220 Encounter for screening for lipoid disorders: Secondary | ICD-10-CM

## 2019-07-24 DIAGNOSIS — R739 Hyperglycemia, unspecified: Secondary | ICD-10-CM

## 2019-07-24 DIAGNOSIS — F988 Other specified behavioral and emotional disorders with onset usually occurring in childhood and adolescence: Secondary | ICD-10-CM

## 2019-07-24 DIAGNOSIS — R799 Abnormal finding of blood chemistry, unspecified: Secondary | ICD-10-CM

## 2019-07-24 DIAGNOSIS — Z131 Encounter for screening for diabetes mellitus: Secondary | ICD-10-CM | POA: Diagnosis not present

## 2019-07-24 DIAGNOSIS — Z9109 Other allergy status, other than to drugs and biological substances: Secondary | ICD-10-CM | POA: Diagnosis not present

## 2019-07-24 DIAGNOSIS — Z8639 Personal history of other endocrine, nutritional and metabolic disease: Secondary | ICD-10-CM

## 2019-07-24 DIAGNOSIS — D519 Vitamin B12 deficiency anemia, unspecified: Secondary | ICD-10-CM

## 2019-07-24 LAB — HEMOGLOBIN A1C: Hgb A1c MFr Bld: 5.3 % (ref 4.6–6.5)

## 2019-07-24 LAB — COMPREHENSIVE METABOLIC PANEL
ALT: 37 U/L (ref 0–53)
AST: 49 U/L — ABNORMAL HIGH (ref 0–37)
Albumin: 4.5 g/dL (ref 3.5–5.2)
Alkaline Phosphatase: 41 U/L (ref 39–117)
BUN: 31 mg/dL — ABNORMAL HIGH (ref 6–23)
CO2: 28 mEq/L (ref 19–32)
Calcium: 10 mg/dL (ref 8.4–10.5)
Chloride: 105 mEq/L (ref 96–112)
Creatinine, Ser: 0.89 mg/dL (ref 0.40–1.50)
GFR: 83.8 mL/min (ref >60.00)
Glucose, Bld: 90 mg/dL (ref 70–99)
Potassium: 5 mEq/L (ref 3.5–5.1)
Sodium: 140 mEq/L (ref 135–145)
Total Bilirubin: 1 mg/dL (ref 0.2–1.2)
Total Protein: 6.6 g/dL (ref 6.0–8.3)

## 2019-07-24 LAB — CBC
HCT: 39.9 % (ref 39.0–52.0)
Hemoglobin: 13.2 g/dL (ref 13.0–17.0)
MCHC: 33.2 g/dL (ref 30.0–36.0)
MCV: 92.6 fl (ref 78.0–100.0)
Platelets: 265 10*3/uL (ref 150.0–400.0)
RBC: 4.31 Mil/uL (ref 4.22–5.81)
RDW: 14.4 % (ref 11.5–15.5)
WBC: 4.4 10*3/uL (ref 4.0–10.5)

## 2019-07-24 LAB — LIPID PANEL
Cholesterol: 160 mg/dL (ref 0–200)
HDL: 79.5 mg/dL (ref >39.00)
LDL Cholesterol: 73 mg/dL (ref 0–99)
NonHDL: 80.12
Total CHOL/HDL Ratio: 2
Triglycerides: 38 mg/dL (ref 0.0–149.0)
VLDL: 7.6 mg/dL (ref 0.0–40.0)

## 2019-07-24 LAB — FERRITIN: Ferritin: 25.3 ng/mL (ref 22.0–322.0)

## 2019-07-24 LAB — VITAMIN B12: Vitamin B-12: 358 pg/mL (ref 211–911)

## 2019-07-24 NOTE — Patient Instructions (Addendum)
It was great to see you again today, I will be in touch with your labs as soon as possible Please consider having the Shingrix series given at your drugstore if not done already Enjoy kayaking this season!   We can plan to visit in about 6 months assuming all is well    Health Maintenance After Age 73 After age 21, you are at a higher risk for certain long-term diseases and infections as well as injuries from falls. Falls are a major cause of broken bones and head injuries in people who are older than age 6. Getting regular preventive care can help to keep you healthy and well. Preventive care includes getting regular testing and making lifestyle changes as recommended by your health care provider. Talk with your health care provider about:  Which screenings and tests you should have. A screening is a test that checks for a disease when you have no symptoms.  A diet and exercise plan that is right for you. What should I know about screenings and tests to prevent falls? Screening and testing are the best ways to find a health problem early. Early diagnosis and treatment give you the best chance of managing medical conditions that are common after age 2. Certain conditions and lifestyle choices may make you more likely to have a fall. Your health care provider may recommend:  Regular vision checks. Poor vision and conditions such as cataracts can make you more likely to have a fall. If you wear glasses, make sure to get your prescription updated if your vision changes.  Medicine review. Work with your health care provider to regularly review all of the medicines you are taking, including over-the-counter medicines. Ask your health care provider about any side effects that may make you more likely to have a fall. Tell your health care provider if any medicines that you take make you feel dizzy or sleepy.  Osteoporosis screening. Osteoporosis is a condition that causes the bones to get weaker. This  can make the bones weak and cause them to break more easily.  Blood pressure screening. Blood pressure changes and medicines to control blood pressure can make you feel dizzy.  Strength and balance checks. Your health care provider may recommend certain tests to check your strength and balance while standing, walking, or changing positions.  Foot health exam. Foot pain and numbness, as well as not wearing proper footwear, can make you more likely to have a fall.  Depression screening. You may be more likely to have a fall if you have a fear of falling, feel emotionally low, or feel unable to do activities that you used to do.  Alcohol use screening. Using too much alcohol can affect your balance and may make you more likely to have a fall. What actions can I take to lower my risk of falls? General instructions  Talk with your health care provider about your risks for falling. Tell your health care provider if: ? You fall. Be sure to tell your health care provider about all falls, even ones that seem minor. ? You feel dizzy, sleepy, or off-balance.  Take over-the-counter and prescription medicines only as told by your health care provider. These include any supplements.  Eat a healthy diet and maintain a healthy weight. A healthy diet includes low-fat dairy products, low-fat (lean) meats, and fiber from whole grains, beans, and lots of fruits and vegetables. Home safety  Remove any tripping hazards, such as rugs, cords, and clutter.  Install safety  equipment such as grab bars in bathrooms and safety rails on stairs.  Keep rooms and walkways well-lit. Activity   Follow a regular exercise program to stay fit. This will help you maintain your balance. Ask your health care provider what types of exercise are appropriate for you.  If you need a cane or walker, use it as recommended by your health care provider.  Wear supportive shoes that have nonskid soles. Lifestyle  Do not drink  alcohol if your health care provider tells you not to drink.  If you drink alcohol, limit how much you have: ? 0-1 drink a day for women. ? 0-2 drinks a day for men.  Be aware of how much alcohol is in your drink. In the [place] one drink equals one typical bottle of beer (12 oz), one-half glass of wine (5 oz), or one shot of hard liquor (1 oz).  Do not use any products that contain nicotine or tobacco, such as cigarettes and e-cigarettes. If you need help quitting, ask your health care provider. Summary  Having a healthy lifestyle and getting preventive care can help to protect your health and wellness after age 22.  Screening and testing are the best way to find a health problem early and help you avoid having a fall. Early diagnosis and treatment give you the best chance for managing medical conditions that are more common for people who are older than age 23.  Falls are a major cause of broken bones and head injuries in people who are older than age 40. Take precautions to prevent a fall at home.  Work with your health care provider to learn what changes you can make to improve your health and wellness and to prevent falls. This information is not intended to replace advice given to you by your health care provider. Make sure you discuss any questions you have with your health care provider. Document Revised: 05/03/2018 Document Reviewed: 11/23/2016 Elsevier Patient Education  2020 [employer].

## 2019-07-24 NOTE — Patient Instructions (Signed)
Jonathan Garrison , Thank you for taking time to come for your Medicare Wellness Visit. I appreciate your ongoing commitment to your health goals. Please review the following plan we discussed and let me know if I can assist you in the future.   Screening recommendations/referrals: Colorectal cancer screening: Completed 03/01/10. Repeat every 10 years Recommended yearly ophthalmology/optometry visit for glaucoma screening and checkup Recommended yearly dental visit for hygiene and checkup  Vaccinations: TDAP status: Up to date Flu Vaccine status: Up to date Pneumococcal vaccine status: Up to date Covid-19 vaccine status: Completed vaccines  Advanced directives: Bring a copy of your living will and/or healthcare power of attorney to your next office visit.    Next appointment: Follow up in one year for your annual wellness visit    Preventive Care 65 Years and Older, Male Preventive care refers to lifestyle choices and visits with your health care provider that can promote health and wellness. This includes:  A yearly physical exam. This is also called an annual well check.  Regular dental and eye exams.  Immunizations.  Screening for certain conditions.  Healthy lifestyle choices, such as diet and exercise. What can I expect for my preventive care visit? Physical exam Your health care provider will check:  Height and weight. These may be used to calculate body mass index (BMI), which is a measurement that tells if you are at a healthy weight.  Heart rate and blood pressure.  Your skin for abnormal spots. Counseling Your health care provider may ask you questions about:  Alcohol, tobacco, and drug use.  Emotional well-being.  Home and relationship well-being.  Sexual activity.  Eating habits.  History of falls.  Memory and ability to understand (cognition).  Work and work Statistician. What immunizations do I need?  Influenza (flu) vaccine  This is recommended  every year. Tetanus, diphtheria, and pertussis (Tdap) vaccine  You may need a Td booster every 10 years. Varicella (chickenpox) vaccine  You may need this vaccine if you have not already been vaccinated. Zoster (shingles) vaccine  You may need this after age 69. Pneumococcal conjugate (PCV13) vaccine  One dose is recommended after age 91. Pneumococcal polysaccharide (PPSV23) vaccine  One dose is recommended after age 54. Measles, mumps, and rubella (MMR) vaccine  You may need at least one dose of MMR if you were born in 1957 or later. You may also need a second dose. Meningococcal conjugate (MenACWY) vaccine  You may need this if you have certain conditions. Hepatitis A vaccine  You may need this if you have certain conditions or if you travel or work in places where you may be exposed to hepatitis A. Hepatitis B vaccine  You may need this if you have certain conditions or if you travel or work in places where you may be exposed to hepatitis B. Haemophilus influenzae type b (Hib) vaccine  You may need this if you have certain conditions. You may receive vaccines as individual doses or as more than one vaccine together in one shot (combination vaccines). Talk with your health care provider about the risks and benefits of combination vaccines. What tests do I need? Blood tests  Lipid and cholesterol levels. These may be checked every 5 years, or more frequently depending on your overall health.  Hepatitis C test.  Hepatitis B test. Screening  Lung cancer screening. You may have this screening every year starting at age 38 if you have a 30-pack-year history of smoking and currently smoke or have quit  within the past 15 years.  Colorectal cancer screening. All adults should have this screening starting at age 37 and continuing until age 38. Your health care provider may recommend screening at age 80 if you are at increased risk. You will have tests every 1-10 years, depending  on your results and the type of screening test.  Prostate cancer screening. Recommendations will vary depending on your family history and other risks.  Diabetes screening. This is done by checking your blood sugar (glucose) after you have not eaten for a while (fasting). You may have this done every 1-3 years.  Abdominal aortic aneurysm (AAA) screening. You may need this if you are a current or former smoker.  Sexually transmitted disease (STD) testing. Follow these instructions at home: Eating and drinking  Eat a diet that includes fresh fruits and vegetables, whole grains, lean protein, and low-fat dairy products. Limit your intake of foods with high amounts of sugar, saturated fats, and salt.  Take vitamin and mineral supplements as recommended by your health care provider.  Do not drink alcohol if your health care provider tells you not to drink.  If you drink alcohol: ? Limit how much you have to 0-2 drinks a day. ? Be aware of how much alcohol is in your drink. In the [place] one drink equals one 12 oz bottle of beer (355 mL), one 5 oz glass of wine (148 mL), or one 1 oz glass of hard liquor (44 mL). Lifestyle  Take daily care of your teeth and gums.  Stay active. Exercise for at least 30 minutes on 5 or more days each week.  Do not use any products that contain nicotine or tobacco, such as cigarettes, e-cigarettes, and chewing tobacco. If you need help quitting, ask your health care provider.  If you are sexually active, practice safe sex. Use a condom or other form of protection to prevent STIs (sexually transmitted infections).  Talk with your health care provider about taking a low-dose aspirin or statin. What's next?  Visit your health care provider once a year for a well check visit.  Ask your health care provider how often you should have your eyes and teeth checked.  Stay up to date on all vaccines. This information is not intended to replace advice given to you  by your health care provider. Make sure you discuss any questions you have with your health care provider. Document Revised: 01/04/2018 Document Reviewed: 01/04/2018 Elsevier Patient Education  2020 [employer].

## 2019-07-25 ENCOUNTER — Encounter: Payer: Self-pay | Admitting: Family Medicine

## 2019-07-25 LAB — TESTOSTERONE TOTAL,FREE,BIO, MALES
Albumin: 4.5 g/dL (ref 3.6–5.1)
Sex Hormone Binding: 76 nmol/L (ref 22–77)
Testosterone, Bioavailable: 113.2 ng/dL (ref 15.0–150.0)
Testosterone, Free: 55.1 pg/mL (ref 6.0–73.0)
Testosterone: 816 ng/dL (ref 250–827)

## 2019-07-28 NOTE — Addendum Note (Signed)
Addended by: Lamar Blinks C on: 07/28/2019 07:39 AM   Modules accepted: Orders

## 2019-07-30 ENCOUNTER — Telehealth: Payer: Self-pay | Admitting: Internal Medicine

## 2019-07-30 NOTE — Telephone Encounter (Signed)
Noted  

## 2019-08-01 ENCOUNTER — Encounter: Payer: Medicare Other | Admitting: Internal Medicine

## 2019-08-19 ENCOUNTER — Other Ambulatory Visit: Payer: Self-pay | Admitting: Internal Medicine

## 2019-08-20 ENCOUNTER — Telehealth: Payer: Self-pay

## 2019-08-20 MED ORDER — OMEPRAZOLE 40 MG PO CPDR: 40.0000 mg | DELAYED_RELEASE_CAPSULE | Freq: Every day | ORAL | 6 refills | Status: DC

## 2019-08-20 NOTE — Telephone Encounter (Signed)
Refilled Omeprazole 

## 2019-08-28 ENCOUNTER — Encounter: Payer: Self-pay | Admitting: Family Medicine

## 2019-08-28 NOTE — Telephone Encounter (Signed)
Spoke with patient on phone. UTD on vaccines.

## 2019-08-29 ENCOUNTER — Encounter: Payer: Self-pay | Admitting: Family Medicine

## 2019-09-02 DIAGNOSIS — F422 Mixed obsessional thoughts and acts: Secondary | ICD-10-CM | POA: Diagnosis not present

## 2019-09-10 DIAGNOSIS — H40013 Open angle with borderline findings, low risk, bilateral: Secondary | ICD-10-CM | POA: Diagnosis not present

## 2019-09-12 ENCOUNTER — Other Ambulatory Visit: Payer: Self-pay

## 2019-09-12 ENCOUNTER — Ambulatory Visit (AMBULATORY_SURGERY_CENTER): Payer: Medicare Other | Admitting: Internal Medicine

## 2019-09-12 ENCOUNTER — Encounter: Payer: Self-pay | Admitting: Internal Medicine

## 2019-09-12 ENCOUNTER — Encounter: Payer: Self-pay | Admitting: Family Medicine

## 2019-09-12 VITALS — BP 117/71 | HR 82 | Temp 98.3°F | Resp 12 | Ht 74.0 in | Wt 200.0 lb

## 2019-09-12 DIAGNOSIS — Z1211 Encounter for screening for malignant neoplasm of colon: Secondary | ICD-10-CM | POA: Diagnosis not present

## 2019-09-12 MED ORDER — SODIUM CHLORIDE 0.9 % IV SOLN: 500.0000 mL | Freq: Once | INTRAVENOUS | Status: DC

## 2019-09-12 NOTE — Op Note (Signed)
Jonathan Garrison Patient Name: Jonathan Garrison Procedure Date: 09/12/2019 3:55 PM MRN: [id number] Endoscopist: Docia Chuck. Henrene Pastor , MD Age: 74 Referring MD:  Date of Birth: 10/22/1945 Gender: Male Account #: [id number] Procedure:                Colonoscopy Indications:              Screening for colorectal malignant neoplasm Medicines:                Monitored Anesthesia Care Procedure:                Pre-Anesthesia Assessment:                           - Prior to the procedure, a History and Physical                            was performed, and patient medications and                            allergies were reviewed. The patient's tolerance of                            previous anesthesia was also reviewed. The risks                            and benefits of the procedure and the sedation                            options and risks were discussed with the patient.                            All questions were answered, and informed consent                            was obtained. Prior Anticoagulants: The patient has                            taken no previous anticoagulant or antiplatelet                            agents. ASA Grade Assessment: II - A patient with                            mild systemic disease. After reviewing the risks                            and benefits, the patient was deemed in                            satisfactory condition to undergo the procedure.                           After obtaining informed consent, the colonoscope  was passed under direct vision. Throughout the                            procedure, the patient's blood pressure, pulse, and                            oxygen saturations were monitored continuously. The                            Colonoscope was introduced through the anus and                            advanced to the the cecum, identified by                            appendiceal orifice and  ileocecal valve. The                            ileocecal valve, appendiceal orifice, and rectum                            were photographed. The quality of the bowel                            preparation was poor. The colonoscopy was performed                            without difficulty. The patient tolerated the                            procedure well. The bowel preparation used was                            SUPREP/Tablets via split dose instruction. Scope In: 4:13:03 PM Scope Out: 4:31:50 PM Scope Withdrawal Time: 0 hours 4 minutes 50 seconds  Total Procedure Duration: 0 hours 18 minutes 47 seconds  Findings:                 The colonoscope was advanced from the rectum to the                            cecum. Preparation was poor. Only lumen occluding                            lesions could be excluded. Complications:            No immediate complications. Estimated blood loss:                            None. Estimated Blood Loss:     Estimated blood loss: none. Impression:               - Preparation of the colon was poor.                           -  Examination to the cecum without lumen occluding                            lesions.                           - No specimens collected. Recommendation:           - Repeat colonoscopy in 6-12 months for repeat                            screening with adequate preparation. WILL NEED 2                            DAY PREP                           - Patient has a contact number available for                            emergencies. The signs and symptoms of potential                            delayed complications were discussed with the                            patient. Return to normal activities tomorrow.                            Written discharge instructions were provided to the                            patient.                           - Resume previous diet.                           - Continue present  medications. Docia Chuck. Henrene Pastor, MD 09/12/2019 4:39:32 PM This report has been signed electronically.

## 2019-09-12 NOTE — Progress Notes (Signed)
PT taken to PACU. Monitors in place. VSS. Report given to RN. 

## 2019-09-12 NOTE — Patient Instructions (Signed)
YOU HAD AN ENDOSCOPIC PROCEDURE TODAY AT THE [facility]:   Refer to the procedure report that was given to you for any specific questions about what was found during the examination.  If the procedure report does not answer your questions, please call your gastroenterologist to clarify.  If you requested that your care partner not be given the details of your procedure findings, then the procedure report has been included in a sealed envelope for you to review at your convenience later. ° °YOU SHOULD EXPECT: Some feelings of bloating in the abdomen. Passage of more gas than usual.  Walking can help get rid of the air that was put into your GI tract during the procedure and reduce the bloating. If you had a lower endoscopy (such as a colonoscopy or flexible sigmoidoscopy) you may notice spotting of blood in your stool or on the toilet paper. If you underwent a bowel prep for your procedure, you may not have a normal bowel movement for a few days. ° °Please Note:  You might notice some irritation and congestion in your nose or some drainage.  This is from the oxygen used during your procedure.  There is no need for concern and it should clear up in a day or so. ° °SYMPTOMS TO REPORT IMMEDIATELY: ° °Following lower endoscopy (colonoscopy or flexible sigmoidoscopy): ° Excessive amounts of blood in the stool ° Significant tenderness or worsening of abdominal pains ° Swelling of the abdomen that is new, acute ° Fever of 100°F or higher ° °For urgent or emergent issues, a gastroenterologist can be reached at any hour by calling [phone]. °Do not use MyChart messaging for urgent concerns.  ° ° °DIET:  We do recommend a small meal at first, but then you may proceed to your regular diet.  Drink plenty of fluids but you should avoid alcoholic beverages for 24 hours. ° °ACTIVITY:  You should plan to take it easy for the rest of today and you should NOT DRIVE or use heavy machinery until tomorrow (because of  the sedation medicines used during the test).   ° °FOLLOW UP: °Our staff will call the number listed on your records 48-72 hours following your procedure to check on you and address any questions or concerns that you may have regarding the information given to you following your procedure. If we do not reach you, we will leave a message.  We will attempt to reach you two times.  During this call, we will ask if you have developed any symptoms of COVID 19. If you develop any symptoms (ie: fever, flu-like symptoms, shortness of breath, cough etc.) before then, please call [phone].  If you test positive for Covid 19 in the 2 weeks post procedure, please call and report this information to us.   ° °SIGNATURES/CONFIDENTIALITY: °You and/or your care partner have signed paperwork which will be entered into your electronic medical record.  These signatures attest to the fact that that the information above on your After Visit Summary has been reviewed and is understood.  Full responsibility of the confidentiality of this discharge information lies with you and/or your care-partner.  °

## 2019-09-14 DIAGNOSIS — F422 Mixed obsessional thoughts and acts: Secondary | ICD-10-CM | POA: Diagnosis not present

## 2019-09-16 ENCOUNTER — Telehealth: Payer: Self-pay | Admitting: *Deleted

## 2019-09-16 ENCOUNTER — Telehealth: Payer: Self-pay

## 2019-09-16 NOTE — Telephone Encounter (Signed)
Attempted to reach pt. With follow-up call following endoscopic procedure 09/12/19.  LM on pt. Voice mail.  Will try to reach pt. Again later today.

## 2019-09-16 NOTE — Telephone Encounter (Signed)
Attempted 2nd f/u phone call. No answer. Left message.  °

## 2019-09-19 ENCOUNTER — Other Ambulatory Visit: Payer: Medicare Other

## 2019-09-25 ENCOUNTER — Telehealth: Payer: Self-pay

## 2019-09-25 NOTE — Telephone Encounter (Signed)
Patient advised via mychart message in previous encounter regarding further instruction for outstanding balance. Will forward concern to Billing leadership, who will handle all follow-up.   Patient's statement printed and placed on [employer] as she requested however original copy in patients chart scanned by him in previous [employer].

## 2019-09-27 NOTE — Addendum Note (Signed)
Addended by: Kelle Darting A on: 09/27/2019 11:02 AM   Modules accepted: Orders

## 2019-09-30 DIAGNOSIS — F422 Mixed obsessional thoughts and acts: Secondary | ICD-10-CM | POA: Diagnosis not present

## 2019-10-02 ENCOUNTER — Other Ambulatory Visit: Payer: Self-pay | Admitting: Family Medicine

## 2019-10-03 ENCOUNTER — Other Ambulatory Visit (INDEPENDENT_AMBULATORY_CARE_PROVIDER_SITE_OTHER): Payer: Medicare Other

## 2019-10-03 ENCOUNTER — Other Ambulatory Visit: Payer: Self-pay

## 2019-10-03 DIAGNOSIS — R799 Abnormal finding of blood chemistry, unspecified: Secondary | ICD-10-CM

## 2019-10-03 LAB — COMPREHENSIVE METABOLIC PANEL
AG Ratio: 1.8 (calc) (ref 1.0–2.5)
ALT: 25 U/L (ref 9–46)
AST: 34 U/L (ref 10–35)
Albumin: 4 g/dL (ref 3.6–5.1)
Alkaline phosphatase (APISO): 51 U/L (ref 35–144)
BUN/Creatinine Ratio: 30 (calc) — ABNORMAL HIGH (ref 6–22)
BUN: 26 mg/dL — ABNORMAL HIGH (ref 7–25)
CO2: 28 mmol/L (ref 20–32)
Calcium: 9.6 mg/dL (ref 8.6–10.3)
Chloride: 106 mmol/L (ref 98–110)
Creat: 0.86 mg/dL (ref 0.70–1.18)
Globulin: 2.2 g/dL (calc) (ref 1.9–3.7)
Glucose, Bld: 84 mg/dL (ref 65–99)
Potassium: 5 mmol/L (ref 3.5–5.3)
Sodium: 141 mmol/L (ref 135–146)
Total Bilirubin: 0.7 mg/dL (ref 0.2–1.2)
Total Protein: 6.2 g/dL (ref 6.1–8.1)

## 2019-10-03 MED ORDER — AMPHETAMINE-DEXTROAMPHETAMINE 20 MG PO TABS
20.0000 mg | ORAL_TABLET | Freq: Two times a day (BID) | ORAL | 0 refills | Status: DC
Start: 2019-10-03 — End: 2020-01-29

## 2019-10-03 NOTE — Telephone Encounter (Signed)
Last written: 07/05/19 Last ov:07/24/19 Next ov: 01/29/20 Contract: none UDS: none

## 2019-10-04 ENCOUNTER — Encounter: Payer: Self-pay | Admitting: Family Medicine

## 2019-11-03 ENCOUNTER — Encounter: Payer: Self-pay | Admitting: Family Medicine

## 2019-11-03 DIAGNOSIS — N529 Male erectile dysfunction, unspecified: Secondary | ICD-10-CM

## 2019-11-04 MED ORDER — SILDENAFIL CITRATE 20 MG PO TABS: ORAL_TABLET | ORAL | 5 refills | Status: DC

## 2019-11-07 DIAGNOSIS — K1329 Other disturbances of oral epithelium, including tongue: Secondary | ICD-10-CM | POA: Diagnosis not present

## 2019-11-17 DIAGNOSIS — Z23 Encounter for immunization: Secondary | ICD-10-CM | POA: Diagnosis not present

## 2019-11-21 DIAGNOSIS — K1329 Other disturbances of oral epithelium, including tongue: Secondary | ICD-10-CM | POA: Diagnosis not present

## 2019-11-25 ENCOUNTER — Encounter: Payer: Self-pay | Admitting: Family Medicine

## 2019-12-04 NOTE — Telephone Encounter (Signed)
Upon looking into patient's transaction inquiry regarding this billing issue, the CPT code is correct and Medicare denied the charge and said it was was not deemed necessary.  Patient paid the $25.00 balance on 10/21/19.

## 2019-12-05 DIAGNOSIS — F422 Mixed obsessional thoughts and acts: Secondary | ICD-10-CM | POA: Diagnosis not present

## 2019-12-09 DIAGNOSIS — Z23 Encounter for immunization: Secondary | ICD-10-CM | POA: Diagnosis not present

## 2019-12-12 DIAGNOSIS — K149 Disease of tongue, unspecified: Secondary | ICD-10-CM | POA: Diagnosis not present

## 2020-01-16 DIAGNOSIS — F422 Mixed obsessional thoughts and acts: Secondary | ICD-10-CM | POA: Diagnosis not present

## 2020-01-22 ENCOUNTER — Telehealth: Payer: Self-pay | Admitting: *Deleted

## 2020-01-22 ENCOUNTER — Telehealth: Payer: Self-pay | Admitting: Dermatology

## 2020-01-22 MED ORDER — TACROLIMUS 0.1 % EX OINT: TOPICAL_OINTMENT | Freq: Every day | CUTANEOUS | 2 refills | Status: DC

## 2020-01-22 NOTE — Telephone Encounter (Signed)
Ok per Dr.Tafeen to give patient a prescription for protopic.

## 2020-01-22 NOTE — Telephone Encounter (Signed)
PA done through cover my meds on Tacrolimus. Waiting for determination.   Drug Tacrolimus 0.1% ointment Form [employer] Medicare Part D Electronic Prior Authorization Form (2017 NCPDP) Original Claim Info [phone] Provide Exception Process Printed NoticeHYDROCORT;AUG BETAMETH PREFDr. Submit [email or web address] at [email or web address] Requires Prior Authorization

## 2020-01-22 NOTE — Telephone Encounter (Signed)
Patient has an outbreak of eczema and had some samples of Protopic that he used and they have worked well.  Patient would like a prescription called in to [facility] on [address] until his appointment on 03/31/2020.

## 2020-01-23 ENCOUNTER — Telehealth: Payer: Self-pay | Admitting: Dermatology

## 2020-01-23 NOTE — Telephone Encounter (Signed)
Wants to know status of prior auth. Said eczema is bad + he needs med

## 2020-01-23 NOTE — Telephone Encounter (Signed)
Phone call to patient to inform him that the prior authorization has been done and we're waiting on a response from his insurance company.  Patient aware.

## 2020-01-25 ENCOUNTER — Encounter: Payer: Self-pay | Admitting: Family Medicine

## 2020-01-26 NOTE — Progress Notes (Addendum)
Jonathan Garrison at [facility] [address], [place], [place] [address] [phone]  Date:  01/29/2020   Name:  Jonathan Garrison   DOB:  06-06-1945   MRN:  [id number]  PCP:  Jonathan Mclean, MD    Chief Complaint: ADD (6 month follow up/)   History of Present Illness:  Jonathan Garrison is a 75 y.o. very pleasant male patient who presents with the following:  Patient is here today for 9-month follow-up visit.  Medicare patient History of anxiety, depression, oral cancer, osteopenia, B12 deficiency, and GERD.  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  He requests a refill of this medication today Aspirin 81 B complex Meloxicam as needed Prilosec 40 daily  Flu vaccine complete covid UTD including booster Colon cancer screen 2021- his prep was not great however so they plan to re-do a scop at some point in the future.  He notes he tends to have some mucus in the stool periodically.  Advised him that if this continues certainly a repeat colonoscopy may be helpful.  Okay to wait until current Covid surge is under control however  Lipids June- looked good B12 in June feritin June, ok CMP most recent September CBC June   He had a couple of falls recently which occurred because he slipped.  He tripped over a root 1 time, slipped on some wet leaves while going down a steep slope the other time He pulled his right shoulder several weeks ago - it has gotten significantly better on it's own however    He notes that his urinary control is ok, erections are ok   The patient sent me the following mychart message regarding issues he wished to discuss today:  1) have had quite a case of eczema on face for a few weeks.  dermat. Dr. Denna Garrison is backed up on appts. but called in Rx for Protopic. then insurance said i need a Prior Authorization and so no Rx for now as the paperwork seems gummed up. :( 2) had 2 falls while walking about a month back: incautiousness not incapacity i think. 1st one almost got my coccyx but no problems; the 2nd i had to reach out with R. arm and so jammed my R. shoulder joint... still a little painful.  3) you may have noticed my colonoscopy with Dr. Henrene Garrison was inconclusive because i wasn't cleaned out enough for full inspection. :(  the good news was no obstructions to the scope. anyway, i've been concerned for some time about some gastro stuff: lots of flatulence, yellow poops, mucus with poops, and mucus by itself at times, one more blood spotting.  4) i do  wonder if #3 relates to high Bilirubin and poss. liver issues ?  5) hoping to have full blood testing + including any anemia/B12/Iron testing.  i think those are my main concerns - but overall i continue to be active with walking/hiking, some weight-lifting and weight-bearing activities... so i feel pretty healthy. :)  my house is still a hoarder's mess. :(  but i have a new girlfriend p.s. 6) also important: lower back at L. continues to be sensitive, somewhat stiff and sometimes painful. hip at L. has similar symptoms.  i wonder if we should check again for osteoarthritis and osteopenia ?  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     ty,  Jonathan Garrison  Lab Results  Component Value Date   PSA 1.00 05/21/2019   PSA 1.11 07/19/2018   PSA 1.35 07/13/2017   We went over his liver function tests.  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  He had gotten some Protopic from his dermatologist, this worked well but was very expensive.  He is trying to get prior Auth approved for Protopic, in the meantime we will have him use triamcinolone sparingly as needed  He also noticed some pain and joint enlargement at the left first MCP, of note he plays quite a bit of guitar and thinks he has developed some arthritis in this joint Finally, he would like to have a DEXA scan to follow-up on osteoporosis seen in 2019 He does have history of hypogonadism, see testosterone levels 2018  Patient Active Problem List   Diagnosis Date Noted  . Increased prostate specific antigen (PSA) velocity 11/27/2017  . Osteopenia 08/01/2017  . Vitamin B12 deficiency anemia 06/09/2014  . Chest pain 03/13/2013  . Depression with anxiety 08/26/2011  . Overweight (BMI 25.0-29.9) 08/26/2011  . Joint pain 08/26/2011  . 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 Oral cancer ([place]) 07/09/2010    Past Medical History:  Diagnosis Date  . ADD (attention deficit disorder) without hyperactivity   . Allergy   . Anemia    no treatment  . Anxiety   . Arthralgia    many joints, managed with vitamins/herbs and ibuprofen  . Arthritis   . Chest pain   . Colitis 1986   single attack (believes was ulcerative)  . Depression   . Eczema   . Esophageal stricture   . GERD (gastroesophageal reflux disease)   . Hemorrhoids   . Lichen planus   . Neuromuscular disorder ([place])    POLIO AGE 47  . Oral cancer ([employer]) 05/2010   plans for surgery 06/2010  . Osteoporosis    hips  . Pleurisy 1984  . Polio age 51  . Umbilical hernia     Past Surgical History:  Procedure Laterality Date  . COLONOSCOPY  2012  . LEG SURGERY  when in 6th grade   L leg, bone spur removal (just above knee, laterally)  . TONGUE SURGERY     for  tongue cancer  . UPPER GASTROINTESTINAL ENDOSCOPY      Social History   Tobacco Use  . Smoking status: Former Smoker    Types: Pipe, Cigarettes    Quit date: 01/25/1976    Years since quitting: 44.0  . Smokeless tobacco: Never Used  . Tobacco comment: Quit smoking 35 years ago  Vaping Use  . Vaping Use: Never used  Substance Use Topics  . Alcohol use: Yes    Alcohol/week: 7.0 - 14.0 standard drinks    Types: 7 - 14 Glasses of wine per week  . Drug use: No  Family History  Problem Relation Age of Onset  . Hypertension Mother   . Diabetes Mother   . Hyperlipidemia Mother   . Hypertension Father   . Benign prostatic hyperplasia Father   . Heart disease Father   . Lichen planus Sister   . Cancer Sister        ?spot on chest? contained  . Diabetes Maternal Grandfather   . Colon cancer Neg Hx   . Esophageal cancer Neg Hx   . Stomach cancer Neg Hx   . Pancreatic cancer Neg Hx   . Liver disease Neg Hx   . Colon polyps Neg Hx   . Rectal cancer Neg Hx     Allergies  Allergen Reactions  . Penicillins Other (See Comments)    REACTION: ?    Medication list has been reviewed and updated.  Current Outpatient Medications on File Prior to Visit  Medication Sig Dispense Refill  . aspirin 81 MG tablet Take 81 mg by mouth daily.    Mar[place] Kitchen b complex vitamins tablet Take 0.5 tablets by mouth daily.    . Biotin 5000 MCG CAPS Take 1 capsule by mouth 2 (two) times daily.    . Calcium-Magnesium-Zinc 167-83-8 MG TABS Take by mouth. 1/2 tablet daily    . chlorpheniramine (CHLOR-TRIMETON) 4 MG tablet Take 2 mg by mouth every 6 (six) hours as needed.    . Cholecalciferol (VITAMIN D3) 2000 UNITS capsule Take 2,000 Units by mouth daily.    Marland Kitchen glucosamine-chondroitin 500-400 MG tablet Take 1 tablet by mouth 2 (two) times daily.    . meloxicam (MOBIC) 15 MG tablet Take 1 tablet (15 mg total) by mouth daily as needed. 90 tablet 3  . Multiple Vitamins-Minerals (MULTIVITAMIN WITH MINERALS) tablet  Take 1 tablet by mouth daily.    . NON FORMULARY ALJ -herbal decongestant  Takes as needed    . omeprazole (PRILOSEC) 40 MG capsule Take 1 capsule (40 mg total) by mouth daily. 30 capsule 6  . sildenafil (REVATIO) 20 MG tablet Take 2-3 tablets one hour prior to intercourse 60 tablet 5  . tacrolimus (PROTOPIC) 0.1 % ointment Apply topically daily. 100 g 2  . Zn-Pyg Afri-Nettle-Saw Palmet (SAW PALMETTO COMPLEX PO) Take 1 tablet by mouth 2 (two) times daily.     No current facility-administered medications on file prior to visit.    Review of Systems:  As per HPI- otherwise negative.   Physical Examination: Vitals:   01/29/20 1043  BP: 128/84  Pulse: 88  Resp: 18  SpO2: 99%   Vitals:   01/29/20 1043  Weight: 195 lb (88.5 kg)  Height: 6\' 2"  (1.88 m)   Body mass index is 25.04 kg/m. Ideal Body Weight: Weight in (lb) to have BMI = 25: 194.3  GEN: no acute distress.  Normal weight, looks well HEENT: Atraumatic, Normocephalic.   Bilateral TM wnl, oropharynx normal.  PEERL,EOMI.   Ears and Nose: No external deformity. CV: RRR, No M/G/R. No JVD. No thrill. No extra heart sounds. PULM: CTA B, no wheezes, crackles, rhonchi. No retractions. No resp. distress. No accessory muscle use. ABD: S, NT, ND, +BS. No rebound. No HSM. EXTR: No c/c/e PSYCH: Normally interactive. Conversant.  There is some joint enlargement of the left first MCP, consistent with osteoarthritis Patient injured her right shoulder in a fall back in November.  On exam he has normal range of motion and minimal pain of the shoulder.   Assessment and Plan: Screening for prostate cancer - Plan:  PSA, Medicare ( [place] [facility] only)  Attention deficit disorder (ADD) without hyperactivity - Plan: amphetamine-dextroamphetamine (ADDERALL) 20 MG tablet  Screening for diabetes mellitus - Plan: Comprehensive metabolic panel, Hemoglobin A1c  Screening for hyperlipidemia - Plan: Lipid panel  Anemia due to vitamin B12  deficiency, unspecified B12 deficiency type - Plan: B12  Eczema, unspecified type - Plan: triamcinolone (KENALOG) 0.1 %  Iron deficiency anemia, unspecified iron deficiency anemia type - Plan: CBC, Ferritin  Osteopenia determined by x-ray - Plan: DG Bone Density  Elevated glucose - Plan: Hemoglobin A1c  Hypogonadism in male - Plan: DG Bone Density  Bone disease - Plan: DG Bone Density  Other specified disorders of bone density and structure, multiple sites  - Plan: DG Bone Density  Patient today for a routine visit/" physical exam" for Medicare patient Labs are pending as above Refilled Adderall Immunizations are up-to-date, suggested Shingrix Ordered bone density scan Suggested Voltaren gel for osteoarthritis of left thumb, discussed Covera syndrome Will plan further follow- up pending labs.   This visit occurred during the SARS-CoV-2 public health emergency.  Safety protocols were in place, including screening questions prior to the visit, additional usage of staff PPE, and extensive cleaning of exam room while observing appropriate contact time as indicated for disinfecting solutions.    Signed Lamar Blinks, MD  Received his labs 1/6- message to pt  Results for orders placed or performed in visit on 01/29/20  CBC  Result Value Ref Range   WBC 4.4 4.0 - 10.5 K/uL   RBC 4.32 4.22 - 5.81 Mil/uL   Platelets 266.0 150.0 - 400.0 K/uL   Hemoglobin 13.2 13.0 - 17.0 g/dL   HCT 39.9 39.0 - 52.0 %   MCV 92.2 78.0 - 100.0 fl   MCHC 33.1 30.0 - 36.0 g/dL   RDW 14.1 11.5 - 15.5 %  Comprehensive metabolic panel  Result Value Ref Range   Sodium 140 135 - 145 mEq/L   Potassium 4.4 3.5 - 5.1 mEq/L   Chloride 105 96 - 112 mEq/L   CO2 30 19 - 32 mEq/L   Glucose, Bld 85 70 - 99 mg/dL   BUN 27 (H) 6 - 23 mg/dL   Creatinine, Ser 0.87 0.40 - 1.50 mg/dL   Total Bilirubin 1.0 0.2 - 1.2 mg/dL   Alkaline Phosphatase 45 39 - 117 U/L   AST 28 0 - 37 U/L   ALT 21 0 - 53 U/L   Total  Protein 6.5 6.0 - 8.3 g/dL   Albumin 4.4 3.5 - 5.2 g/dL   GFR 85.61 >60.00 mL/min   Calcium 9.5 8.4 - 10.5 mg/dL  B12  Result Value Ref Range   Vitamin B-12 260 211 - 911 pg/mL  Ferritin  Result Value Ref Range   Ferritin 18.6 (L) 22.0 - 322.0 ng/mL  Hemoglobin A1c  Result Value Ref Range   Hgb A1c MFr Bld 5.4 4.6 - 6.5 %  Lipid panel  Result Value Ref Range   Cholesterol 191 0 - 200 mg/dL   Triglycerides 34.0 0.0 - 149.0 mg/dL   HDL 87.90 >39.00 mg/dL   VLDL 6.8 0.0 - 40.0 mg/dL   LDL Cholesterol 96 0 - 99 mg/dL   Total CHOL/HDL Ratio 2    NonHDL 103.25   PSA, Medicare ( [place] only)  Result Value Ref Range   PSA 1.07 0.10 - 4.00 ng/ml

## 2020-01-26 NOTE — Patient Instructions (Addendum)
Good to see you again today!  The bilirubin issue we discussed is called [place] syndrome  You might try some Voltaren gel on your thumb as needed Let me know if your shoulder does not get back to normal.  Gradual return to kayaking when you are ready- don't overdo!    I will be in touch with your labs asap Please consider getting the shingles vaccine series at your pharmacy if not done already    Health Maintenance After Age 74 After age 71, you are at a higher risk for certain long-term diseases and infections as well as injuries from falls. Falls are a major cause of broken bones and head injuries in people who are older than age 73. Getting regular preventive care can help to keep you healthy and well. Preventive care includes getting regular testing and making lifestyle changes as recommended by your health care provider. Talk with your health care provider about:  Which screenings and tests you should have. A screening is a test that checks for a disease when you have no symptoms.  A diet and exercise plan that is right for you. What should I know about screenings and tests to prevent falls? Screening and testing are the best ways to find a health problem early. Early diagnosis and treatment give you the best chance of managing medical conditions that are common after age 74. Certain conditions and lifestyle choices may make you more likely to have a fall. Your health care provider may recommend:  Regular vision checks. Poor vision and conditions such as cataracts can make you more likely to have a fall. If you wear glasses, make sure to get your prescription updated if your vision changes.  Medicine review. Work with your health care provider to regularly review all of the medicines you are taking, including over-the-counter medicines. Ask your health care provider about any side effects that may make you more likely to have a fall. Tell your health care provider if any medicines that you  take make you feel dizzy or sleepy.  Osteoporosis screening. Osteoporosis is a condition that causes the bones to get weaker. This can make the bones weak and cause them to break more easily.  Blood pressure screening. Blood pressure changes and medicines to control blood pressure can make you feel dizzy.  Strength and balance checks. Your health care provider may recommend certain tests to check your strength and balance while standing, walking, or changing positions.  Foot health exam. Foot pain and numbness, as well as not wearing proper footwear, can make you more likely to have a fall.  Depression screening. You may be more likely to have a fall if you have a fear of falling, feel emotionally low, or feel unable to do activities that you used to do.  Alcohol use screening. Using too much alcohol can affect your balance and may make you more likely to have a fall. What actions can I take to lower my risk of falls? General instructions  Talk with your health care provider about your risks for falling. Tell your health care provider if: ? You fall. Be sure to tell your health care provider about all falls, even ones that seem minor. ? You feel dizzy, sleepy, or off-balance.  Take over-the-counter and prescription medicines only as told by your health care provider. These include any supplements.  Eat a healthy diet and maintain a healthy weight. A healthy diet includes low-fat dairy products, low-fat (lean) meats, and fiber from whole  grains, beans, and lots of fruits and vegetables. Home safety  Remove any tripping hazards, such as rugs, cords, and clutter.  Install safety equipment such as grab bars in bathrooms and safety rails on stairs.  Keep rooms and walkways well-lit. Activity   Follow a regular exercise program to stay fit. This will help you maintain your balance. Ask your health care provider what types of exercise are appropriate for you.  If you need a cane or  walker, use it as recommended by your health care provider.  Wear supportive shoes that have nonskid soles. Lifestyle  Do not drink alcohol if your health care provider tells you not to drink.  If you drink alcohol, limit how much you have: ? 0-1 drink a day for women. ? 0-2 drinks a day for men.  Be aware of how much alcohol is in your drink. In the [place] one drink equals one typical bottle of beer (12 oz), one-half glass of wine (5 oz), or one shot of hard liquor (1 oz).  Do not use any products that contain nicotine or tobacco, such as cigarettes and e-cigarettes. If you need help quitting, ask your health care provider. Summary  Having a healthy lifestyle and getting preventive care can help to protect your health and wellness after age 58.  Screening and testing are the best way to find a health problem early and help you avoid having a fall. Early diagnosis and treatment give you the best chance for managing medical conditions that are more common for people who are older than age 61.  Falls are a major cause of broken bones and head injuries in people who are older than age 53. Take precautions to prevent a fall at home.  Work with your health care provider to learn what changes you can make to improve your health and wellness and to prevent falls. This information is not intended to replace advice given to you by your health care provider. Make sure you discuss any questions you have with your health care provider. Document Revised: 05/03/2018 Document Reviewed: 11/23/2016 Elsevier Patient Education  2020 [employer].

## 2020-01-29 ENCOUNTER — Other Ambulatory Visit: Payer: Self-pay

## 2020-01-29 ENCOUNTER — Ambulatory Visit (INDEPENDENT_AMBULATORY_CARE_PROVIDER_SITE_OTHER): Payer: Medicare Other | Admitting: Family Medicine

## 2020-01-29 ENCOUNTER — Encounter: Payer: Self-pay | Admitting: Family Medicine

## 2020-01-29 VITALS — BP 128/84 | HR 88 | Resp 18 | Ht 74.0 in | Wt 195.0 lb

## 2020-01-29 DIAGNOSIS — Z125 Encounter for screening for malignant neoplasm of prostate: Secondary | ICD-10-CM

## 2020-01-29 DIAGNOSIS — M8589 Other specified disorders of bone density and structure, multiple sites: Secondary | ICD-10-CM | POA: Diagnosis not present

## 2020-01-29 DIAGNOSIS — D509 Iron deficiency anemia, unspecified: Secondary | ICD-10-CM

## 2020-01-29 DIAGNOSIS — Z131 Encounter for screening for diabetes mellitus: Secondary | ICD-10-CM | POA: Diagnosis not present

## 2020-01-29 DIAGNOSIS — M899 Disorder of bone, unspecified: Secondary | ICD-10-CM | POA: Diagnosis not present

## 2020-01-29 DIAGNOSIS — L309 Dermatitis, unspecified: Secondary | ICD-10-CM | POA: Diagnosis not present

## 2020-01-29 DIAGNOSIS — D519 Vitamin B12 deficiency anemia, unspecified: Secondary | ICD-10-CM | POA: Diagnosis not present

## 2020-01-29 DIAGNOSIS — E291 Testicular hypofunction: Secondary | ICD-10-CM | POA: Diagnosis not present

## 2020-01-29 DIAGNOSIS — Z13220 Encounter for screening for lipoid disorders: Secondary | ICD-10-CM

## 2020-01-29 DIAGNOSIS — R7309 Other abnormal glucose: Secondary | ICD-10-CM

## 2020-01-29 DIAGNOSIS — F988 Other specified behavioral and emotional disorders with onset usually occurring in childhood and adolescence: Secondary | ICD-10-CM | POA: Diagnosis not present

## 2020-01-29 DIAGNOSIS — M8580 Other specified disorders of bone density and structure, unspecified site: Secondary | ICD-10-CM | POA: Diagnosis not present

## 2020-01-29 LAB — HEMOGLOBIN A1C: Hgb A1c MFr Bld: 5.4 % (ref 4.6–6.5)

## 2020-01-29 LAB — COMPREHENSIVE METABOLIC PANEL
ALT: 21 U/L (ref 0–53)
AST: 28 U/L (ref 0–37)
Albumin: 4.4 g/dL (ref 3.5–5.2)
Alkaline Phosphatase: 45 U/L (ref 39–117)
BUN: 27 mg/dL — ABNORMAL HIGH (ref 6–23)
CO2: 30 mEq/L (ref 19–32)
Calcium: 9.5 mg/dL (ref 8.4–10.5)
Chloride: 105 mEq/L (ref 96–112)
Creatinine, Ser: 0.87 mg/dL (ref 0.40–1.50)
GFR: 85.61 mL/min (ref >60.00)
Glucose, Bld: 85 mg/dL (ref 70–99)
Potassium: 4.4 mEq/L (ref 3.5–5.1)
Sodium: 140 mEq/L (ref 135–145)
Total Bilirubin: 1 mg/dL (ref 0.2–1.2)
Total Protein: 6.5 g/dL (ref 6.0–8.3)

## 2020-01-29 LAB — CBC
HCT: 39.9 % (ref 39.0–52.0)
Hemoglobin: 13.2 g/dL (ref 13.0–17.0)
MCHC: 33.1 g/dL (ref 30.0–36.0)
MCV: 92.2 fl (ref 78.0–100.0)
Platelets: 266 10*3/uL (ref 150.0–400.0)
RBC: 4.32 Mil/uL (ref 4.22–5.81)
RDW: 14.1 % (ref 11.5–15.5)
WBC: 4.4 10*3/uL (ref 4.0–10.5)

## 2020-01-29 LAB — PSA, MEDICARE: PSA: 1.07 ng/ml (ref 0.10–4.00)

## 2020-01-29 LAB — VITAMIN B12: Vitamin B-12: 260 pg/mL (ref 211–911)

## 2020-01-29 LAB — LIPID PANEL
Cholesterol: 191 mg/dL (ref 0–200)
HDL: 87.9 mg/dL (ref >39.00)
LDL Cholesterol: 96 mg/dL (ref 0–99)
NonHDL: 103.25
Total CHOL/HDL Ratio: 2
Triglycerides: 34 mg/dL (ref 0.0–149.0)
VLDL: 6.8 mg/dL (ref 0.0–40.0)

## 2020-01-29 LAB — FERRITIN: Ferritin: 18.6 ng/mL — ABNORMAL LOW (ref 22.0–322.0)

## 2020-01-29 MED ORDER — TRIAMCINOLONE ACETONIDE 0.1 % EX CREA: 1.0000 "application " | TOPICAL_CREAM | Freq: Two times a day (BID) | CUTANEOUS | 0 refills | Status: DC

## 2020-01-29 MED ORDER — AMPHETAMINE-DEXTROAMPHETAMINE 20 MG PO TABS: 20.0000 mg | ORAL_TABLET | Freq: Two times a day (BID) | ORAL | 0 refills | Status: DC

## 2020-01-30 ENCOUNTER — Telehealth: Payer: Self-pay | Admitting: *Deleted

## 2020-01-30 ENCOUNTER — Encounter: Payer: Self-pay | Admitting: Family Medicine

## 2020-01-30 DIAGNOSIS — K149 Disease of tongue, unspecified: Secondary | ICD-10-CM | POA: Diagnosis not present

## 2020-01-30 DIAGNOSIS — N289 Disorder of kidney and ureter, unspecified: Secondary | ICD-10-CM

## 2020-01-30 DIAGNOSIS — G8929 Other chronic pain: Secondary | ICD-10-CM

## 2020-01-30 DIAGNOSIS — M545 Low back pain, unspecified: Secondary | ICD-10-CM

## 2020-01-30 DIAGNOSIS — M542 Cervicalgia: Secondary | ICD-10-CM

## 2020-01-30 DIAGNOSIS — E611 Iron deficiency: Secondary | ICD-10-CM

## 2020-01-30 DIAGNOSIS — M25559 Pain in unspecified hip: Secondary | ICD-10-CM

## 2020-01-30 DIAGNOSIS — R799 Abnormal finding of blood chemistry, unspecified: Secondary | ICD-10-CM

## 2020-01-30 MED ORDER — FERROUS SULFATE 324 (65 FE) MG PO TBEC: DELAYED_RELEASE_TABLET | ORAL | 3 refills | Status: DC

## 2020-01-30 NOTE — Telephone Encounter (Signed)
Prior Authorization for tacrolimus approved.

## 2020-02-13 DIAGNOSIS — F422 Mixed obsessional thoughts and acts: Secondary | ICD-10-CM | POA: Diagnosis not present

## 2020-02-19 DIAGNOSIS — N281 Cyst of kidney, acquired: Secondary | ICD-10-CM | POA: Diagnosis not present

## 2020-02-19 DIAGNOSIS — Z113 Encounter for screening for infections with a predominantly sexual mode of transmission: Secondary | ICD-10-CM | POA: Diagnosis not present

## 2020-02-19 DIAGNOSIS — R3911 Hesitancy of micturition: Secondary | ICD-10-CM | POA: Diagnosis not present

## 2020-02-19 DIAGNOSIS — R351 Nocturia: Secondary | ICD-10-CM | POA: Diagnosis not present

## 2020-02-19 DIAGNOSIS — N401 Enlarged prostate with lower urinary tract symptoms: Secondary | ICD-10-CM | POA: Diagnosis not present

## 2020-02-20 NOTE — Telephone Encounter (Signed)
Labs faxed to [facility]. -JMA

## 2020-02-28 DIAGNOSIS — N281 Cyst of kidney, acquired: Secondary | ICD-10-CM | POA: Diagnosis not present

## 2020-03-05 DIAGNOSIS — K1329 Other disturbances of oral epithelium, including tongue: Secondary | ICD-10-CM | POA: Diagnosis not present

## 2020-03-11 DIAGNOSIS — H40013 Open angle with borderline findings, low risk, bilateral: Secondary | ICD-10-CM | POA: Diagnosis not present

## 2020-03-18 MED ORDER — MELOXICAM 15 MG PO TABS
15.0000 mg | ORAL_TABLET | Freq: Every day | ORAL | 3 refills | Status: DC | PRN
Start: 2020-03-18 — End: 2021-02-25

## 2020-03-18 NOTE — Addendum Note (Signed)
Addended by: Lamar Blinks C on: 03/18/2020 04:08 PM   Modules accepted: Orders

## 2020-03-31 ENCOUNTER — Encounter: Payer: Self-pay | Admitting: Dermatology

## 2020-03-31 ENCOUNTER — Ambulatory Visit (INDEPENDENT_AMBULATORY_CARE_PROVIDER_SITE_OTHER): Payer: Medicare Other | Admitting: Dermatology

## 2020-03-31 ENCOUNTER — Other Ambulatory Visit: Payer: Self-pay

## 2020-03-31 DIAGNOSIS — Z1283 Encounter for screening for malignant neoplasm of skin: Secondary | ICD-10-CM | POA: Diagnosis not present

## 2020-03-31 DIAGNOSIS — L570 Actinic keratosis: Secondary | ICD-10-CM | POA: Diagnosis not present

## 2020-03-31 DIAGNOSIS — L814 Other melanin hyperpigmentation: Secondary | ICD-10-CM | POA: Diagnosis not present

## 2020-03-31 DIAGNOSIS — L738 Other specified follicular disorders: Secondary | ICD-10-CM

## 2020-04-02 ENCOUNTER — Encounter: Payer: Self-pay | Admitting: Dermatology

## 2020-04-06 ENCOUNTER — Ambulatory Visit (INDEPENDENT_AMBULATORY_CARE_PROVIDER_SITE_OTHER): Payer: Medicare Other | Admitting: Internal Medicine

## 2020-04-06 ENCOUNTER — Encounter: Payer: Self-pay | Admitting: Internal Medicine

## 2020-04-06 VITALS — BP 162/90 | HR 81 | Ht 74.0 in | Wt 198.5 lb

## 2020-04-06 DIAGNOSIS — K625 Hemorrhage of anus and rectum: Secondary | ICD-10-CM | POA: Diagnosis not present

## 2020-04-06 DIAGNOSIS — K222 Esophageal obstruction: Secondary | ICD-10-CM | POA: Diagnosis not present

## 2020-04-06 DIAGNOSIS — K219 Gastro-esophageal reflux disease without esophagitis: Secondary | ICD-10-CM

## 2020-04-06 DIAGNOSIS — Z1211 Encounter for screening for malignant neoplasm of colon: Secondary | ICD-10-CM

## 2020-04-06 MED ORDER — OMEPRAZOLE 40 MG PO CPDR: 40.0000 mg | DELAYED_RELEASE_CAPSULE | Freq: Every day | ORAL | 3 refills | Status: DC

## 2020-04-06 MED ORDER — PLENVU 140 G PO SOLR: 1.0000 | Freq: Once | ORAL | 0 refills | Status: AC

## 2020-04-06 NOTE — Patient Instructions (Signed)
We have sent the following medications to your pharmacy for you to pick up at your convenience:  Omeprazole  You have been scheduled for a colonoscopy. Please follow written instructions given to you at your visit today.  Please pick up your prep supplies at the pharmacy within the next 1-3 days. If you use inhalers (even only as needed), please bring them with you on the day of your procedure.

## 2020-04-06 NOTE — Progress Notes (Signed)
HISTORY OF PRESENT ILLNESS:  Jonathan Garrison is a 75 y.o. male with GERD complicated by severe erosive esophagitis and peptic stricturing of the esophagus requiring esophageal dilation.  He presents today with a chief complaint of isolated rectal bleeding.  He experienced rectal bleeding January 12, 2020.  He presents image showing bright red blood in small amounts in the toilet bowl.  The patient did undergo routine screening colonoscopy September 12, 2019.  Though the examination was complete to the cecum, it was compromised by poor preparation.  Repeat examination with more extensive preparation in 6 to 12 months from that date was recommended.  He does describe some mucus per rectum.  No other complaints.  For his GERD he takes omeprazole 40 mg daily.  He reports good control of reflux symptoms.  No recurrent dysphagia.  His last upper endoscopy with balloon dilation of the esophagus to 20 mm was performed January 2020.  Review of blood work from January 2022 shows a normal hemoglobin of 13.2.  Unchanged from June 2021.  REVIEW OF SYSTEMS:  All non-GI ROS negative unless otherwise stated in the HPI except for arthritis, hearing problems, eczema  Past Medical History:  Diagnosis Date  . ADD (attention deficit disorder) without hyperactivity   . Allergy   . Anemia    no treatment  . Anxiety   . Arthralgia    many joints, managed with vitamins/herbs and ibuprofen  . Arthritis   . Chest pain   . Colitis 1986   single attack (believes was ulcerative)  . Depression   . Eczema   . Esophageal stricture   . GERD (gastroesophageal reflux disease)   . Hemorrhoids   . Lichen planus   . 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 Osteoporosis    hips  . Pleurisy 1984  . Polio age 83  . Umbilical hernia     Past Surgical History:  Procedure Laterality Date  . COLONOSCOPY  2012  . LEG SURGERY  when in 6th grade   L leg, bone spur  removal (just above knee, laterally)  . TONGUE SURGERY     for tongue cancer  . UPPER GASTROINTESTINAL ENDOSCOPY      Social History Llewyn Heap  reports that he quit smoking about 44 years ago. His smoking use included pipe and cigarettes. He has never used smokeless tobacco. He reports current alcohol use of about 7.0 - 14.0 standard drinks of alcohol per week. He reports that he does not use drugs.  family history includes Benign prostatic hyperplasia in his father; Cancer in his sister; Diabetes in his maternal grandfather and mother; Heart disease in his father; Hyperlipidemia in his mother; Hypertension in his father and mother; Lichen planus in his sister.  Allergies  Allergen Reactions  . Penicillins Other (See Comments)    REACTION: ?       PHYSICAL EXAMINATION: Vital signs: BP (!) 162/90 (BP Location: Left Arm, Patient Position: Sitting, Cuff Size: Normal)   Pulse 81   Ht 6\' 2"  (1.88 m)   Wt 198 lb 8 oz (90 kg)   BMI 25.49 kg/m   Constitutional: generally well-appearing, no acute distress Psychiatric: alert and oriented x3, cooperative Eyes: extraocular movements intact, anicteric, conjunctiva pink Mouth: Mask Neck: supple no lymphadenopathy Cardiovascular: heart regular rate and rhythm, no murmur Lungs: clear to auscultation bilaterally Abdomen: soft, nontender, nondistended, no obvious ascites, no peritoneal signs, normal  bowel sounds, no organomegaly Rectal: Deferred until colonoscopy Extremities: no clubbing, cyanosis, or lower extremity edema bilaterally Skin: no lesions on visible extremities Neuro: No focal deficits.  Cranial nerves intact  ASSESSMENT:  1.  Minor rectal bleeding.  Suspect benign anorectal pathology such as internal hemorrhoids. 2.  Screening colonoscopy August 2021 compromised by poor prep 3.  GERD complicated by erosive esophagitis and peptic stricture.  Most recent EGD with dilation January 2020.  Currently asymptomatic post dilation on  PPI   PLAN:  1.  Schedule colonoscopy for colon cancer screening and evaluate Minor rectal bleeding.  More vigorous preparation with magnesium citrate to be followed by standard split prep.The nature of the procedure, as well as the risks, benefits, and alternatives were carefully and thoroughly reviewed with the patient. Ample time for discussion and questions allowed. The patient understood, was satisfied, and agreed to proceed. 2.  Reflux precautions 3.  Refill omeprazole 40 mg daily. 4.  Repeat EGD with dilation as needed for recurrent dysphagia 5.  Ongoing general medical care with Dr. Lorelei Pont

## 2020-04-07 LAB — ANAEROBIC AND AEROBIC CULTURE
MICRO NUMBER:: 11629710
MICRO NUMBER:: 11629711
SPECIMEN QUALITY:: ADEQUATE
SPECIMEN QUALITY:: ADEQUATE

## 2020-04-19 ENCOUNTER — Encounter: Payer: Self-pay | Admitting: Dermatology

## 2020-04-19 NOTE — Progress Notes (Signed)
   Follow-Up Visit   Subjective  Jonathan Garrison is a 75 y.o. male who presents for the following: Skin Problem (Scalp crust all over the crown, post scalp/neck area red spots per girlfriend).  General skin check Location:  Duration:  Quality:  Associated Signs/Symptoms: Modifying Factors:  Severity:  Timing: Context: Episodic right scalp  Objective  Well appearing patient in no apparent distress; mood and affect are within normal limits. Objective  Chest - Medial [facility]): General skin examination.  No atypical moles no skin cancer today,  left ear no recurrent lesion   Objective  Mid Parietal Scalp: Scattered impetiginized to millimeter crusts   Objective  Left Supraclavicular Area: Four millimeter gritty pink crust  Objective  Mid Parietal Scalp: Stable 5 mm monochrome brown macule    A full examination was performed including scalp, head, eyes, ears, nose, lips, neck, chest, axillae, abdomen, back, buttocks, bilateral upper extremities, bilateral lower extremities, hands, feet, fingers, toes, fingernails, and toenails. All findings within normal limits unless otherwise noted below.   Assessment & Plan    Screening exam for skin cancer Chest - Medial [facility])  Annual skin examination, self examined with border twice annually, continued UV protection.  Bacterial folliculitis Mid Parietal Scalp  Patient stated he has mupirocin 2% ointment at home and didn't need a new RX   Other Related Procedures Anaerobic and Aerobic Culture  AK (actinic keratosis) Left Supraclavicular Area  Destruction of lesion - Left Supraclavicular Area Complexity: simple   Destruction method: cryotherapy   Informed consent: discussed and consent obtained   Timeout:  patient name, date of birth, surgical site, and procedure verified Lesion destroyed using liquid nitrogen: Yes   Cryotherapy cycles:  5 Outcome: patient tolerated procedure well with no complications   Post-procedure  details: wound care instructions given    Lentigo Mid Parietal Scalp  Recheck if changes.      I, [name], MD, have reviewed all documentation for this visit.  The documentation on 04/19/20 for the exam, diagnosis, procedures, and orders are all accurate and complete.

## 2020-04-20 ENCOUNTER — Encounter: Payer: Self-pay | Admitting: Family Medicine

## 2020-04-20 ENCOUNTER — Ambulatory Visit (HOSPITAL_BASED_OUTPATIENT_CLINIC_OR_DEPARTMENT_OTHER)
Admission: RE | Admit: 2020-04-20 | Discharge: 2020-04-20 | Disposition: A | Discharge status: Home / Self Care | Payer: Medicare Other | Source: Ambulatory Visit | Attending: Family Medicine | Admitting: Family Medicine

## 2020-04-20 ENCOUNTER — Other Ambulatory Visit: Payer: Self-pay

## 2020-04-20 DIAGNOSIS — M545 Low back pain, unspecified: Secondary | ICD-10-CM

## 2020-04-20 DIAGNOSIS — G8929 Other chronic pain: Secondary | ICD-10-CM

## 2020-04-20 DIAGNOSIS — M25552 Pain in left hip: Secondary | ICD-10-CM | POA: Insufficient documentation

## 2020-04-20 DIAGNOSIS — M25559 Pain in unspecified hip: Secondary | ICD-10-CM

## 2020-04-20 DIAGNOSIS — M25551 Pain in right hip: Secondary | ICD-10-CM | POA: Diagnosis not present

## 2020-04-20 DIAGNOSIS — M47816 Spondylosis without myelopathy or radiculopathy, lumbar region: Secondary | ICD-10-CM | POA: Insufficient documentation

## 2020-04-20 DIAGNOSIS — E291 Testicular hypofunction: Secondary | ICD-10-CM

## 2020-04-20 DIAGNOSIS — M8589 Other specified disorders of bone density and structure, multiple sites: Secondary | ICD-10-CM

## 2020-04-20 DIAGNOSIS — M2578 Osteophyte, vertebrae: Secondary | ICD-10-CM | POA: Insufficient documentation

## 2020-04-20 DIAGNOSIS — M47812 Spondylosis without myelopathy or radiculopathy, cervical region: Secondary | ICD-10-CM | POA: Diagnosis not present

## 2020-04-20 DIAGNOSIS — M4802 Spinal stenosis, cervical region: Secondary | ICD-10-CM | POA: Diagnosis not present

## 2020-04-20 DIAGNOSIS — M810 Age-related osteoporosis without current pathological fracture: Secondary | ICD-10-CM | POA: Insufficient documentation

## 2020-04-20 DIAGNOSIS — M85852 Other specified disorders of bone density and structure, left thigh: Secondary | ICD-10-CM | POA: Diagnosis not present

## 2020-04-20 DIAGNOSIS — M8580 Other specified disorders of bone density and structure, unspecified site: Secondary | ICD-10-CM

## 2020-04-20 DIAGNOSIS — M899 Disorder of bone, unspecified: Secondary | ICD-10-CM

## 2020-04-21 ENCOUNTER — Encounter: Payer: Self-pay | Admitting: Family Medicine

## 2020-05-06 ENCOUNTER — Telehealth: Payer: Self-pay | Admitting: Internal Medicine

## 2020-05-06 NOTE — Telephone Encounter (Signed)
Inbound call from patient. Procedure is 4/21. Stated PLENVU is sold out and on back order at at least 2 pharmacies [facility] and [place]. Need a call back on what else can take. Best contact [phone]

## 2020-05-06 NOTE — Telephone Encounter (Signed)
Spoke to patient and told him I would leave a Plenvu sample up front to be picked up.  Patient agreed.

## 2020-05-08 ENCOUNTER — Encounter: Payer: Self-pay | Admitting: Family Medicine

## 2020-05-08 DIAGNOSIS — F988 Other specified behavioral and emotional disorders with onset usually occurring in childhood and adolescence: Secondary | ICD-10-CM

## 2020-05-08 MED ORDER — AMPHETAMINE-DEXTROAMPHETAMINE 20 MG PO TABS: 20.0000 mg | ORAL_TABLET | Freq: Two times a day (BID) | ORAL | 0 refills | Status: DC

## 2020-05-11 ENCOUNTER — Other Ambulatory Visit: Payer: Self-pay

## 2020-05-11 ENCOUNTER — Ambulatory Visit (INDEPENDENT_AMBULATORY_CARE_PROVIDER_SITE_OTHER): Payer: Medicare Other | Admitting: Dermatology

## 2020-05-11 DIAGNOSIS — D485 Neoplasm of uncertain behavior of skin: Secondary | ICD-10-CM

## 2020-05-11 DIAGNOSIS — D0439 Carcinoma in situ of skin of other parts of face: Secondary | ICD-10-CM | POA: Diagnosis not present

## 2020-05-11 DIAGNOSIS — L570 Actinic keratosis: Secondary | ICD-10-CM

## 2020-05-11 DIAGNOSIS — Y9367 Activity, basketball: Secondary | ICD-10-CM | POA: Diagnosis not present

## 2020-05-11 DIAGNOSIS — T1490XA Injury, unspecified, initial encounter: Secondary | ICD-10-CM | POA: Diagnosis not present

## 2020-05-11 MED ORDER — TOLAK 4 % EX CREA: TOPICAL_CREAM | CUTANEOUS | 0 refills | Status: DC

## 2020-05-11 NOTE — Patient Instructions (Signed)

## 2020-05-11 NOTE — Telephone Encounter (Signed)
I have no clue.  This pt has Medicare and a supplement.  We would not send out a letter to him because he has double coverage.  I'm guessing he spoke with Es and or Lea?

## 2020-05-14 ENCOUNTER — Ambulatory Visit (AMBULATORY_SURGERY_CENTER): Payer: Medicare Other | Admitting: Internal Medicine

## 2020-05-14 ENCOUNTER — Encounter: Payer: Self-pay | Admitting: Internal Medicine

## 2020-05-14 ENCOUNTER — Other Ambulatory Visit: Payer: Self-pay

## 2020-05-14 VITALS — BP 133/82 | HR 82 | Temp 98.6°F | Resp 16 | Ht 74.0 in | Wt 198.0 lb

## 2020-05-14 DIAGNOSIS — Z1211 Encounter for screening for malignant neoplasm of colon: Secondary | ICD-10-CM

## 2020-05-14 DIAGNOSIS — K648 Other hemorrhoids: Secondary | ICD-10-CM | POA: Diagnosis not present

## 2020-05-14 DIAGNOSIS — K6389 Other specified diseases of intestine: Secondary | ICD-10-CM

## 2020-05-14 DIAGNOSIS — K625 Hemorrhage of anus and rectum: Secondary | ICD-10-CM | POA: Diagnosis not present

## 2020-05-14 MED ORDER — SODIUM CHLORIDE 0.9 % IV SOLN: 500.0000 mL | INTRAVENOUS | Status: DC

## 2020-05-14 NOTE — Patient Instructions (Signed)
Thank you for allowing [place] to care for you today!  No further routine colonoscopies recommended based on current age guidelines and no polyps found this exam.  Resume previous diet and medications today.  Return to your normal daily activities tomorrow.   YOU HAD AN ENDOSCOPIC PROCEDURE TODAY AT [facility]:   Refer to the procedure report that was given to you for any specific questions about what was found during the examination.  If the procedure report does not answer your questions, please call your gastroenterologist to clarify.  If you requested that your care partner not be given the details of your procedure findings, then the procedure report has been included in a sealed envelope for you to review at your convenience later.  YOU SHOULD EXPECT: Some feelings of bloating in the abdomen. Passage of more gas than usual.  Walking can help get rid of the air that was put into your GI tract during the procedure and reduce the bloating. If you had a lower endoscopy (such as a colonoscopy or flexible sigmoidoscopy) you may notice spotting of blood in your stool or on the toilet paper. If you underwent a bowel prep for your procedure, you may not have a normal bowel movement for a few days.  Please Note:  You might notice some irritation and congestion in your nose or some drainage.  This is from the oxygen used during your procedure.  There is no need for concern and it should clear up in a day or so.  SYMPTOMS TO REPORT IMMEDIATELY:   Following lower endoscopy (colonoscopy or flexible sigmoidoscopy):  Excessive amounts of blood in the stool  Significant tenderness or worsening of abdominal pains  Swelling of the abdomen that is new, acute  Fever of 100F or higher   For urgent or emergent issues, a gastroenterologist can be reached at any hour by calling [phone]. Do not use MyChart messaging for urgent concerns.    DIET:  We do recommend a small meal at first,  but then you may proceed to your regular diet.  Drink plenty of fluids but you should avoid alcoholic beverages for 24 hours.  ACTIVITY:  You should plan to take it easy for the rest of today and you should NOT DRIVE or use heavy machinery until tomorrow (because of the sedation medicines used during the test).    FOLLOW UP: Our staff will call the number listed on your records 48-72 hours following your procedure to check on you and address any questions or concerns that you may have regarding the information given to you following your procedure. If we do not reach you, we will leave a message.  We will attempt to reach you two times.  During this call, we will ask if you have developed any symptoms of COVID 19. If you develop any symptoms (ie: fever, flu-like symptoms, shortness of breath, cough etc.) before then, please call [phone].  If you test positive for Covid 19 in the 2 weeks post procedure, please call and report this information to [place].    If any biopsies were taken you will be contacted by phone or by letter within the next 1-3 weeks.  Please call us at [phone] if you have not heard about the biopsies in 3 weeks.    SIGNATURES/CONFIDENTIALITY: You and/or your care partner have signed paperwork which will be entered into your electronic medical record.  These signatures attest to the fact that that the information above on  your After Visit Summary has been reviewed and is understood.  Full responsibility of the confidentiality of this discharge information lies with you and/or your care-partner. 

## 2020-05-14 NOTE — Op Note (Signed)
[name] Procedure Date: 05/14/2020 4:05 PM MRN: [id number] Endoscopist: Docia Chuck. Henrene Pastor , MD Age: 75 Referring MD:  Date of Birth: August 28, 1945 Gender: Male Account #: [id number] Procedure:                Colonoscopy Indications:              Screening for colorectal malignant neoplasm.                            Previous examination August 2021 was compromised by                            poor preparation. Patient also reports some                            interval rectal bleeding and passage of mucus per                            rectum Medicines:                Monitored Anesthesia Care Procedure:                Pre-Anesthesia Assessment:                           - Prior to the procedure, a History and Physical                            was performed, and patient medications and                            allergies were reviewed. The patient's tolerance of                            previous anesthesia was also reviewed. The risks                            and benefits of the procedure and the sedation                            options and risks were discussed with the patient.                            All questions were answered, and informed consent                            was obtained. Prior Anticoagulants: The patient has                            taken no previous anticoagulant or antiplatelet                            agents. ASA Grade Assessment: II - A patient with  mild systemic disease. After reviewing the risks                            and benefits, the patient was deemed in                            satisfactory condition to undergo the procedure.                           After obtaining informed consent, the colonoscope                            was passed under direct vision. Throughout the                            procedure, the patient's blood pressure, pulse, and                             oxygen saturations were monitored continuously. The                            Olympus CF-HQ190 [id number]) Colonoscope was                            introduced through the anus and advanced to the the                            cecum, identified by appendiceal orifice and                            ileocecal valve. The ileocecal valve, appendiceal                            orifice, and rectum were photographed. The quality                            of the bowel preparation was good. The colonoscopy                            was performed without difficulty. The patient                            tolerated the procedure well. The bowel preparation                            used was Prepopik via split dose instruction. Scope In: 4:12:11 PM Scope Out: 4:38:18 PM Scope Withdrawal Time: 0 hours 16 minutes 24 seconds  Total Procedure Duration: 0 hours 26 minutes 7 seconds  Findings:                 An area of mild melanosis was found in the entire                            colon.  Internal hemorrhoids were found during retroflexion.                           The exam was otherwise without abnormality on                            direct and retroflexion views. Complications:            No immediate complications. Estimated blood loss:                            None. Estimated Blood Loss:     Estimated blood loss: none. Impression:               - Melanosis in the colon.                           - Internal hemorrhoids.                           - The examination was otherwise normal on direct                            and retroflexion views.                           - No specimens collected. Recommendation:           - Repeat colonoscopy is not recommended for                            surveillance.                           - Patient has a contact number available for                            emergencies. The signs and symptoms of potential                             delayed complications were discussed with the                            patient. Return to normal activities tomorrow.                            Written discharge instructions were provided to the                            patient.                           - Resume previous diet.                           - Continue present medications. Docia Chuck. Henrene Pastor, MD 05/14/2020 4:44:58 PM This report has been signed electronically.

## 2020-05-14 NOTE — Progress Notes (Signed)
Pt Drowsy. VSS. To PACU, report to RN. No anesthetic complications noted.  

## 2020-05-18 ENCOUNTER — Telehealth: Payer: Self-pay

## 2020-05-18 ENCOUNTER — Telehealth: Payer: Self-pay | Admitting: *Deleted

## 2020-05-18 NOTE — Telephone Encounter (Signed)
Left message on f/u call 

## 2020-05-18 NOTE — Telephone Encounter (Signed)
LVM

## 2020-05-19 ENCOUNTER — Encounter: Payer: Self-pay | Admitting: Dermatology

## 2020-05-19 ENCOUNTER — Telehealth: Payer: Self-pay

## 2020-05-19 NOTE — Telephone Encounter (Signed)
Path to patient and June surgery made

## 2020-05-19 NOTE — Telephone Encounter (Signed)
-----   Message from [name], MD sent at 05/19/2020  5:43 AM EDT ----- Schedule surgery with Dr. Darene Lamer

## 2020-05-19 NOTE — Progress Notes (Signed)
   Follow-Up Visit   Subjective  Jonathan Garrison is a 75 y.o. male who presents for the following: Follow-up (Bacterial folliculitis follow up of scalp - seems resolved.) and Other (Spots of left cheek, chest that he would like checked. Also would like his toenails checked - one is lifting off and one that looks bruised.).  Dark spot under toenail plus growth of crust on cheek Location:  Duration:  Quality:  Associated Signs/Symptoms: Modifying Factors:  Severity:  Timing: Context:   Objective  Well appearing patient in no apparent distress; mood and affect are within normal limits. Objective  Left Hallux Toe Nail Plate: Brown-red smudge subungual left great toenail; dermoscopy shows areas of hemorrhage.  History of possible trauma from playing basketball   Images    Objective  Left Parotid Area: Ill-defined waxy 9 mm crust, rule out CIS     Objective  Mid Parietal Scalp: Hornlike 4 mm pink crust    All skin waist up examined.  Plus feet.   Assessment & Plan    Trauma Left Hallux Toe Nail Plate  Discussed the similarity in appearance to subungual melanoma.  If area is not growing distally in the next 10 weeks return to discuss biopsy.  Neoplasm of uncertain behavior of skin Left Parotid Area  Skin / nail biopsy Type of biopsy: tangential   Informed consent: discussed and consent obtained   Timeout: patient name, date of birth, surgical site, and procedure verified   Anesthesia: the lesion was anesthetized in a standard fashion   Anesthetic:  1% lidocaine w/ epinephrine 1-100,000 local infiltration Instrument used: flexible razor blade   Hemostasis achieved with: aluminum chloride and electrodesiccation   Outcome: patient tolerated procedure well   Post-procedure details: wound care instructions given    Specimen 1 - Surgical pathology Differential Diagnosis: bcc scc  Check Margins: No  AK (actinic keratosis) Mid Parietal Scalp  Destruction of lesion  - Mid Parietal Scalp Complexity: simple   Destruction method: cryotherapy   Informed consent: discussed and consent obtained   Lesion destroyed using liquid nitrogen: Yes   Cryotherapy cycles:  4 Outcome: patient tolerated procedure well with no complications    Fluorouracil (TOLAK) 4 % CREA - Mid Parietal Scalp      I, [name], MD, have reviewed all documentation for this visit.  The documentation on 05/19/20 for the exam, diagnosis, procedures, and orders are all accurate and complete.

## 2020-06-03 ENCOUNTER — Encounter: Payer: Self-pay | Admitting: Family Medicine

## 2020-06-03 ENCOUNTER — Other Ambulatory Visit: Payer: Self-pay | Admitting: Family Medicine

## 2020-06-03 DIAGNOSIS — Z9109 Other allergy status, other than to drugs and biological substances: Secondary | ICD-10-CM

## 2020-06-11 ENCOUNTER — Encounter: Payer: Self-pay | Admitting: Family Medicine

## 2020-06-11 ENCOUNTER — Other Ambulatory Visit: Payer: Self-pay

## 2020-06-11 ENCOUNTER — Ambulatory Visit (INDEPENDENT_AMBULATORY_CARE_PROVIDER_SITE_OTHER): Payer: Medicare Other | Admitting: Family Medicine

## 2020-06-11 VITALS — BP 132/82 | HR 68 | Temp 98.6°F | Resp 16 | Ht 74.0 in | Wt 191.0 lb

## 2020-06-11 DIAGNOSIS — Z7189 Other specified counseling: Secondary | ICD-10-CM | POA: Diagnosis not present

## 2020-06-11 DIAGNOSIS — E611 Iron deficiency: Secondary | ICD-10-CM | POA: Diagnosis not present

## 2020-06-11 DIAGNOSIS — R040 Epistaxis: Secondary | ICD-10-CM

## 2020-06-11 DIAGNOSIS — S61409A Unspecified open wound of unspecified hand, initial encounter: Secondary | ICD-10-CM

## 2020-06-11 DIAGNOSIS — R799 Abnormal finding of blood chemistry, unspecified: Secondary | ICD-10-CM | POA: Diagnosis not present

## 2020-06-11 NOTE — Patient Instructions (Addendum)
It was good to see you again today!  Keep vaseline or other ointment on your hand wounds to keep them from cracking Ok to stop aspirin use if you like!  I will be in touch with your labs
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
## 2020-06-12 ENCOUNTER — Encounter: Payer: Self-pay | Admitting: Family Medicine

## 2020-06-12 DIAGNOSIS — R799 Abnormal finding of blood chemistry, unspecified: Secondary | ICD-10-CM

## 2020-06-12 DIAGNOSIS — N189 Chronic kidney disease, unspecified: Secondary | ICD-10-CM

## 2020-06-12 DIAGNOSIS — I10 Essential (primary) hypertension: Secondary | ICD-10-CM

## 2020-06-12 LAB — BASIC METABOLIC PANEL
BUN: 30 mg/dL — ABNORMAL HIGH (ref 6–23)
CO2: 28 mEq/L (ref 19–32)
Calcium: 9.4 mg/dL (ref 8.4–10.5)
Chloride: 106 mEq/L (ref 96–112)
Creatinine, Ser: 0.76 mg/dL (ref 0.40–1.50)
GFR: 88.95 mL/min (ref >60.00)
Glucose, Bld: 94 mg/dL (ref 70–99)
Potassium: 5 mEq/L (ref 3.5–5.1)
Sodium: 140 mEq/L (ref 135–145)

## 2020-06-12 LAB — CBC
HCT: 41.3 % (ref 39.0–52.0)
Hemoglobin: 13.6 g/dL (ref 13.0–17.0)
MCHC: 32.9 g/dL (ref 30.0–36.0)
MCV: 92.1 fl (ref 78.0–100.0)
Platelets: 318 10*3/uL (ref 150.0–400.0)
RBC: 4.48 Mil/uL (ref 4.22–5.81)
RDW: 14.1 % (ref 11.5–15.5)
WBC: 5.8 10*3/uL (ref 4.0–10.5)

## 2020-06-12 LAB — FERRITIN: Ferritin: 35.8 ng/mL (ref 22.0–322.0)

## 2020-06-18 DIAGNOSIS — F422 Mixed obsessional thoughts and acts: Secondary | ICD-10-CM | POA: Diagnosis not present

## 2020-06-24 NOTE — Addendum Note (Signed)
Addended by: Lamar Blinks C on: 06/24/2020 03:55 PM   Modules accepted: Orders

## 2020-06-25 DIAGNOSIS — K1329 Other disturbances of oral epithelium, including tongue: Secondary | ICD-10-CM | POA: Diagnosis not present

## 2020-06-29 ENCOUNTER — Ambulatory Visit (HOSPITAL_BASED_OUTPATIENT_CLINIC_OR_DEPARTMENT_OTHER): Payer: Medicare Other

## 2020-06-30 NOTE — Addendum Note (Signed)
Addended by: Wynonia Musty A on: 06/30/2020 12:58 PM   Modules accepted: Orders

## 2020-06-30 NOTE — Addendum Note (Signed)
Addended by: Wynonia Musty A on: 06/30/2020 10:13 AM   Modules accepted: Orders

## 2020-07-01 ENCOUNTER — Ambulatory Visit (HOSPITAL_BASED_OUTPATIENT_CLINIC_OR_DEPARTMENT_OTHER): Payer: Medicare Other

## 2020-07-03 ENCOUNTER — Other Ambulatory Visit: Payer: Self-pay

## 2020-07-03 ENCOUNTER — Encounter: Payer: Self-pay | Admitting: Family Medicine

## 2020-07-03 ENCOUNTER — Ambulatory Visit (HOSPITAL_COMMUNITY)
Admission: RE | Admit: 2020-07-03 | Discharge: 2020-07-03 | Disposition: A | Discharge status: Home / Self Care | Payer: Medicare Other | Source: Ambulatory Visit | Attending: Family Medicine | Admitting: Family Medicine

## 2020-07-03 DIAGNOSIS — I10 Essential (primary) hypertension: Secondary | ICD-10-CM | POA: Insufficient documentation

## 2020-07-03 DIAGNOSIS — N189 Chronic kidney disease, unspecified: Secondary | ICD-10-CM | POA: Diagnosis not present

## 2020-07-03 NOTE — Progress Notes (Signed)
Renal artery duplex has been completed.   Preliminary results in CV Proc.   Abram Sander 07/03/2020 9:53 AM

## 2020-07-06 ENCOUNTER — Encounter: Payer: Self-pay | Admitting: Family Medicine

## 2020-07-07 ENCOUNTER — Encounter: Payer: Self-pay | Admitting: Family Medicine

## 2020-07-08 ENCOUNTER — Encounter: Payer: Self-pay | Admitting: Family Medicine

## 2020-07-09 ENCOUNTER — Ambulatory Visit (INDEPENDENT_AMBULATORY_CARE_PROVIDER_SITE_OTHER): Payer: Medicare Other | Admitting: Dermatology

## 2020-07-09 ENCOUNTER — Other Ambulatory Visit: Payer: Self-pay

## 2020-07-09 DIAGNOSIS — D0439 Carcinoma in situ of skin of other parts of face: Secondary | ICD-10-CM

## 2020-07-09 DIAGNOSIS — D049 Carcinoma in situ of skin, unspecified: Secondary | ICD-10-CM

## 2020-07-09 NOTE — Patient Instructions (Signed)

## 2020-07-16 ENCOUNTER — Encounter: Payer: Medicare Other | Admitting: Dermatology

## 2020-07-16 ENCOUNTER — Encounter: Payer: Self-pay | Admitting: Dermatology

## 2020-07-16 DIAGNOSIS — F422 Mixed obsessional thoughts and acts: Secondary | ICD-10-CM | POA: Diagnosis not present

## 2020-07-17 NOTE — Progress Notes (Signed)
   Follow-Up Visit   Subjective  Jonathan Garrison is a 75 y.o. male who presents for the following: Procedure (Patient here today for CIS x 1 left parotid area.).  CIS left cheek Location:  Duration:  Quality:  Associated Signs/Symptoms: Modifying Factors:  Severity:  Timing: Context: For treatment  Objective  Well appearing patient in no apparent distress; mood and affect are within normal limits. Left Parotid Area Lesion identified in room by nurse and Dr. Denna Haggard.     A focused examination was performed including head and neck. Relevant physical exam findings are noted in the Assessment and Plan.   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  Destruction of lesion Complexity: simple   Destruction method: electrodesiccation and curettage   Informed consent: discussed and consent obtained   Timeout:  patient name, date of birth, surgical site, and procedure verified Anesthesia: the lesion was anesthetized in a standard fashion   Anesthetic:  1% lidocaine w/ epinephrine 1-100,000 local infiltration Curettage performed in three different directions: Yes   Curettage cycles:  3 Lesion length (cm):  1.8 Lesion width (cm):  1.8 Margin per side (cm):  0 Final wound size (cm):  1.8 Hemostasis achieved with:  ferric subsulfate Outcome: patient tolerated procedure well with no complications   Additional details:  Wound innoculated with 5 fluorouracil solution.      I, [name], MD, have reviewed all documentation for this visit.  The documentation on 07/17/20 for the exam, diagnosis, procedures, and orders are all accurate and complete.

## 2020-07-30 ENCOUNTER — Telehealth: Payer: Self-pay | Admitting: Family Medicine

## 2020-07-30 NOTE — Telephone Encounter (Signed)
Copied from [place] [id number]. Topic: Medicare AWV >> Jul 30, 2020  9:24 AM Harris-Coley, Hannah Beat wrote: Reason for CRM: Left message for patient to schedule Annual Wellness Visit.  Please schedule with Health Nurse Advisor Augustine Radar. at [facility].

## 2020-07-31 DIAGNOSIS — Z20822 Contact with and (suspected) exposure to covid-19: Secondary | ICD-10-CM | POA: Diagnosis not present

## 2020-08-01 ENCOUNTER — Encounter: Payer: Self-pay | Admitting: Family Medicine

## 2020-08-01 DIAGNOSIS — F988 Other specified behavioral and emotional disorders with onset usually occurring in childhood and adolescence: Secondary | ICD-10-CM

## 2020-08-01 DIAGNOSIS — U071 COVID-19: Secondary | ICD-10-CM

## 2020-08-02 MED ORDER — NIRMATRELVIR/RITONAVIR (PAXLOVID)TABLET: 3.0000 | ORAL_TABLET | Freq: Two times a day (BID) | ORAL | 0 refills | Status: AC

## 2020-08-12 DIAGNOSIS — Z20822 Contact with and (suspected) exposure to covid-19: Secondary | ICD-10-CM | POA: Diagnosis not present

## 2020-08-25 MED ORDER — AMPHETAMINE-DEXTROAMPHETAMINE 20 MG PO TABS: 20.0000 mg | ORAL_TABLET | Freq: Two times a day (BID) | ORAL | 0 refills | Status: DC

## 2020-08-25 NOTE — Addendum Note (Signed)
Addended by: Lamar Blinks C on: 08/25/2020 05:29 PM   Modules accepted: Orders

## 2020-08-28 DIAGNOSIS — F422 Mixed obsessional thoughts and acts: Secondary | ICD-10-CM | POA: Diagnosis not present

## 2020-09-08 DIAGNOSIS — H40013 Open angle with borderline findings, low risk, bilateral: Secondary | ICD-10-CM | POA: Diagnosis not present

## 2020-09-16 DIAGNOSIS — K1329 Other disturbances of oral epithelium, including tongue: Secondary | ICD-10-CM | POA: Diagnosis not present

## 2020-09-24 DIAGNOSIS — F422 Mixed obsessional thoughts and acts: Secondary | ICD-10-CM | POA: Diagnosis not present

## 2020-10-06 ENCOUNTER — Ambulatory Visit (INDEPENDENT_AMBULATORY_CARE_PROVIDER_SITE_OTHER): Payer: Medicare Other | Admitting: Dermatology

## 2020-10-06 ENCOUNTER — Encounter: Payer: Self-pay | Admitting: Dermatology

## 2020-10-06 ENCOUNTER — Other Ambulatory Visit: Payer: Self-pay

## 2020-10-06 DIAGNOSIS — T1490XA Injury, unspecified, initial encounter: Secondary | ICD-10-CM

## 2020-10-06 DIAGNOSIS — S90219A Contusion of unspecified great toe with damage to nail, initial encounter: Secondary | ICD-10-CM

## 2020-10-06 DIAGNOSIS — S90212A Contusion of left great toe with damage to nail, initial encounter: Secondary | ICD-10-CM | POA: Diagnosis not present

## 2020-10-06 DIAGNOSIS — L570 Actinic keratosis: Secondary | ICD-10-CM | POA: Diagnosis not present

## 2020-10-06 DIAGNOSIS — Z1283 Encounter for screening for malignant neoplasm of skin: Secondary | ICD-10-CM | POA: Diagnosis not present

## 2020-10-17 ENCOUNTER — Encounter: Payer: Self-pay | Admitting: Dermatology

## 2020-10-17 NOTE — Progress Notes (Signed)
   Follow-Up Visit   Subjective  Jonathan Garrison is a 75 y.o. male who presents for the following: Annual Exam (Scalp- new scaly spots- - did get the tolak cream and will use this winter & check left great toe- recheck).  Recheck dark spot under toenail and crusts scalp. Location:  Duration:  Quality:  Associated Signs/Symptoms: Modifying Factors:  Severity:  Timing: Context:   Objective  Well appearing patient in no apparent distress; mood and affect are within normal limits. Left Hallux Toe Nail Plate Proximal edge of pigmentation has advanced distally 17mm; focal hemorrhage appreciated on dermoscopy. Negative Hutchinsons. Patient wishes to avoid biopsy and I agree unless there is clinical worsening.     Mid Frontal Scalp Moderate number of mostly small pink gritty crusts, mainly scalp    A focused examination was performed including head, neck, feet, nails.. Relevant physical exam findings are noted in the Assessment and Plan.   Assessment & Plan    Subungual hematoma of great toe Left Hallux Toe Nail Plate  Continued observation.  AK (actinic keratosis) Mid Frontal Scalp  Tolak, 28 applications in winter.  Related Medications Fluorouracil (TOLAK) 4 % CREA Total of 28 applications to the area      I, [name], MD, have reviewed all documentation for this visit.  The documentation on 10/17/20 for the exam, diagnosis, procedures, and orders are all accurate and complete.

## 2020-10-22 DIAGNOSIS — F422 Mixed obsessional thoughts and acts: Secondary | ICD-10-CM | POA: Diagnosis not present

## 2020-10-23 ENCOUNTER — Encounter: Payer: Self-pay | Admitting: Family Medicine

## 2020-10-23 ENCOUNTER — Other Ambulatory Visit: Payer: Self-pay | Admitting: Family Medicine

## 2020-10-23 DIAGNOSIS — E611 Iron deficiency: Secondary | ICD-10-CM

## 2020-10-24 MED ORDER — FERROUS SULFATE 324 (65 FE) MG PO TBEC: DELAYED_RELEASE_TABLET | ORAL | 3 refills | Status: DC

## 2020-10-28 ENCOUNTER — Encounter: Payer: Self-pay | Admitting: Family Medicine

## 2020-10-28 DIAGNOSIS — M2550 Pain in unspecified joint: Secondary | ICD-10-CM

## 2020-10-28 DIAGNOSIS — H9190 Unspecified hearing loss, unspecified ear: Secondary | ICD-10-CM

## 2020-10-29 NOTE — Addendum Note (Signed)
Addended by: Lamar Blinks C on: 10/29/2020 09:10 PM   Modules accepted: Orders

## 2020-10-31 NOTE — Addendum Note (Signed)
Addended by: Lamar Blinks C on: 10/31/2020 06:56 AM   Modules accepted: Orders

## 2020-11-04 DIAGNOSIS — M1711 Unilateral primary osteoarthritis, right knee: Secondary | ICD-10-CM | POA: Diagnosis not present

## 2020-11-04 DIAGNOSIS — M1712 Unilateral primary osteoarthritis, left knee: Secondary | ICD-10-CM | POA: Diagnosis not present

## 2020-11-16 DIAGNOSIS — Z822 Family history of deafness and hearing loss: Secondary | ICD-10-CM | POA: Diagnosis not present

## 2020-11-16 DIAGNOSIS — H9313 Tinnitus, bilateral: Secondary | ICD-10-CM | POA: Diagnosis not present

## 2020-11-16 DIAGNOSIS — Z570 Occupational exposure to noise: Secondary | ICD-10-CM | POA: Diagnosis not present

## 2020-11-16 DIAGNOSIS — H903 Sensorineural hearing loss, bilateral: Secondary | ICD-10-CM | POA: Diagnosis not present

## 2020-11-19 DIAGNOSIS — K1379 Other lesions of oral mucosa: Secondary | ICD-10-CM | POA: Diagnosis not present

## 2020-11-26 ENCOUNTER — Encounter: Payer: Self-pay | Admitting: Family Medicine

## 2020-11-26 DIAGNOSIS — F988 Other specified behavioral and emotional disorders with onset usually occurring in childhood and adolescence: Secondary | ICD-10-CM

## 2020-11-26 MED ORDER — AMPHETAMINE-DEXTROAMPHETAMINE 20 MG PO TABS: 20.0000 mg | ORAL_TABLET | Freq: Two times a day (BID) | ORAL | 0 refills | Status: DC

## 2020-11-29 MED ORDER — AMPHETAMINE-DEXTROAMPHETAMINE 20 MG PO TABS: 20.0000 mg | ORAL_TABLET | Freq: Two times a day (BID) | ORAL | 0 refills | Status: DC

## 2020-11-29 NOTE — Addendum Note (Signed)
Addended by: Lamar Blinks C on: 11/29/2020 06:35 AM   Modules accepted: Orders

## 2020-12-09 ENCOUNTER — Encounter: Payer: Self-pay | Admitting: Family Medicine

## 2020-12-09 DIAGNOSIS — M1711 Unilateral primary osteoarthritis, right knee: Secondary | ICD-10-CM | POA: Diagnosis not present

## 2020-12-09 DIAGNOSIS — M1712 Unilateral primary osteoarthritis, left knee: Secondary | ICD-10-CM | POA: Diagnosis not present

## 2020-12-09 DIAGNOSIS — M79662 Pain in left lower leg: Secondary | ICD-10-CM | POA: Diagnosis not present

## 2020-12-15 ENCOUNTER — Ambulatory Visit: Payer: Medicare Other

## 2020-12-16 ENCOUNTER — Encounter: Payer: Self-pay | Admitting: Family Medicine

## 2020-12-16 DIAGNOSIS — Z23 Encounter for immunization: Secondary | ICD-10-CM | POA: Diagnosis not present

## 2020-12-21 ENCOUNTER — Ambulatory Visit: Payer: Medicare Other

## 2020-12-26 ENCOUNTER — Other Ambulatory Visit (HOSPITAL_BASED_OUTPATIENT_CLINIC_OR_DEPARTMENT_OTHER): Payer: Self-pay

## 2020-12-26 DIAGNOSIS — Z23 Encounter for immunization: Secondary | ICD-10-CM | POA: Diagnosis not present

## 2020-12-26 MED ORDER — PFIZER COVID-19 VAC BIVALENT 30 MCG/0.3ML IM SUSP
INTRAMUSCULAR | 0 refills | Status: DC
  Filled 2020-12-26: qty 0.3, 1d supply, fill #0

## 2020-12-28 ENCOUNTER — Other Ambulatory Visit (HOSPITAL_BASED_OUTPATIENT_CLINIC_OR_DEPARTMENT_OTHER): Payer: Self-pay

## 2020-12-30 DIAGNOSIS — F422 Mixed obsessional thoughts and acts: Secondary | ICD-10-CM | POA: Diagnosis not present

## 2021-01-06 ENCOUNTER — Encounter: Payer: Self-pay | Admitting: Family Medicine

## 2021-01-07 DIAGNOSIS — R6889 Other general symptoms and signs: Secondary | ICD-10-CM | POA: Diagnosis not present

## 2021-01-07 DIAGNOSIS — R509 Fever, unspecified: Secondary | ICD-10-CM | POA: Diagnosis not present

## 2021-01-07 DIAGNOSIS — J189 Pneumonia, unspecified organism: Secondary | ICD-10-CM | POA: Diagnosis not present

## 2021-01-07 DIAGNOSIS — Z20822 Contact with and (suspected) exposure to covid-19: Secondary | ICD-10-CM | POA: Diagnosis not present

## 2021-01-10 DIAGNOSIS — J189 Pneumonia, unspecified organism: Secondary | ICD-10-CM | POA: Diagnosis not present

## 2021-01-19 NOTE — Progress Notes (Deleted)
[place] at [facility] [address] [place], [place] [address] [phone]  Date:  01/20/2021   Name:  Jonathan Garrison   DOB:  09-09-1945   MRN:  [id number]  PCP:  Darreld Mclean, MD    Chief Complaint: No chief complaint on file.   History of Present Illness:  Jonathan Garrison is a 75 y.o. very pleasant male patient who presents with the following:  Patient seen today for follow-up of recently diagnosed walking pneumonia Most recent visit with myself in May of this year History of anxiety, depression, oral cancer, osteopenia, B12 deficiency, and GERD.  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 He did end up going to urgent care on December 15-tested negative for COVID and flu and was diagnosed with possible walking pneumonia.  He was given doxycycline, prednisone and then albuterol inhaler He followed up at urgent care on December 18: Jonathan Garrison is a 75 y.o. male is here for persistent cough and fatigue in the setting of clinical diagnosis of pneumonia. Afebrile and nontoxic-appearing today in clinic. No hypoxia nor shortness of breath. Lungs continue with crackles and rhonchi, no wheezing. Clinical picture most consistent with pneumonia but is improving. Recommend he complete his course of antibiotics and steroids and arrange follow-up with his primary care doctor in about 1 to 2 weeks to ensure resolution of his pneumonia. Invited to return back to the clinic should he experience worsening symptoms. ICD-10-CM  1. Pneumonia due to infectious organism, unspecified laterality, unspecified part of lung J18.9     Most recent labs in May He followed up with dentistry and oral surgery at [facility] in October: ASSESSMENT: Jonathan Garrison is a 75 y.o. 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 The patient is overall doing well with healed wound bed of the left lateral tongue without evidence of new lesions. They will benefit from sharp cusp reduction of the lingual cusps of the left mandibular dentition.    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   Oral cancer ([place]) 07/09/2010    Past Medical History:  Diagnosis Date   ADD (attention deficit disorder) without hyperactivity    Allergy    Anemia    no treatment   Anxiety    Arthralgia    many joints, managed with vitamins/herbs and ibuprofen   Arthritis    Chest pain    Colitis 1986   single attack (believes was ulcerative)   Depression    Eczema    Esophageal stricture    GERD (gastroesophageal reflux disease)    Hemorrhoids    Lichen planus    Neuromuscular disorder ([place])    POLIO AGE 66   Oral cancer ([place]) 05/2010   plans for surgery 06/2010   Osteoporosis    hips   Pleurisy 1984  Polio age 50   Umbilical hernia     Past Surgical History:  Procedure Laterality Date   COLONOSCOPY  2012   LEG SURGERY  when in 6th grade   L leg, bone spur removal (just above knee, laterally)   TONGUE SURGERY     for tongue cancer   UPPER GASTROINTESTINAL ENDOSCOPY      Social History   Tobacco Use   Smoking status: Former    Types: Pipe, Cigarettes    Quit date: 01/25/1976    Years since quitting: 45.0   Smokeless tobacco: Never   Tobacco comments:    Quit smoking 35 years ago  Vaping Use   Vaping Use: Never used   Substance Use Topics   Alcohol use: Yes    Alcohol/week: 7.0 - 14.0 standard drinks    Types: 7 - 14 Glasses of wine per week   Drug use: No    Family History  Problem Relation Age of Onset   Hypertension Mother    Diabetes Mother    Hyperlipidemia Mother    Hypertension Father    Benign prostatic hyperplasia Father    Heart disease Father    Lichen planus Sister    Cancer Sister        ?spot on chest? contained   Diabetes Maternal Grandfather    Colon cancer Neg Hx    Esophageal cancer Neg Hx    Stomach cancer Neg Hx    Pancreatic cancer Neg Hx    Liver disease Neg Hx    Colon polyps Neg Hx    Rectal cancer Neg Hx     Allergies  Allergen Reactions   Penicillins Other (See Comments)    REACTION: ?    Medication list has been reviewed and updated.  Current Outpatient Medications on File Prior to Visit  Medication Sig Dispense Refill   amphetamine-dextroamphetamine (ADDERALL) 20 MG tablet Take 1 tablet (20 mg total) by mouth 2 (two) times daily. 60 tablet 0   aspirin 81 MG tablet Take 81 mg by mouth daily.     b complex vitamins tablet Take 0.5 tablets by mouth daily.     Biotin 5000 MCG CAPS Take 1 capsule by mouth daily.     Calcium-Magnesium-Zinc 540-98-1 MG TABS Take by mouth. 1 tablet daily     chlorpheniramine (CHLOR-TRIMETON) 4 MG tablet Take 2 mg by mouth every 6 (six) hours as needed.     Cholecalciferol (VITAMIN D3) 2000 UNITS capsule Take 2,000 Units by mouth daily.     COVID-19 mRNA bivalent vaccine, Pfizer, (PFIZER COVID-19 VAC BIVALENT) injection Inject into the muscle. 0.3 mL 0   ferrous sulfate 324 (65 Fe) MG TBEC Take one every other day for low iron 45 tablet 3   Fluorouracil (TOLAK) 4 % CREA Total of 28 applications to the area (Patient not taking: Reported on 10/06/2020) 40 g 0   fluticasone (FLONASE) 50 MCG/ACT nasal spray SHAKE LIQUID AND USE 2 SPRAYS IN EACH NOSTRIL DAILY 16 g 11   glucosamine-chondroitin 500-400 MG tablet Take 1 tablet by mouth  2 (two) times daily.     meloxicam (MOBIC) 15 MG tablet Take 1 tablet (15 mg total) by mouth daily as needed. 90 tablet 3   Multiple Vitamins-Minerals (MULTIVITAMIN WITH MINERALS) tablet Take 1 tablet by mouth daily.     NON FORMULARY ALJ -herbal decongestant  Takes as needed     omeprazole (PRILOSEC) 40 MG capsule Take 1 capsule (40 mg total) by mouth daily. [place]  capsule 3   sildenafil (REVATIO) 20 MG tablet Take 2-3 tablets one hour prior to intercourse 60 tablet 5   tacrolimus (PROTOPIC) 0.1 % ointment Apply topically daily. 100 g 2   Zn-Pyg Afri-Nettle-Saw Palmet (SAW PALMETTO COMPLEX PO) Take 1 tablet by mouth 2 (two) times daily.     No current facility-administered medications on file prior to visit.    Review of Systems:  As per HPI- otherwise negative.   Physical Examination: There were no vitals filed for this visit. There were no vitals filed for this visit. There is no height or weight on file to calculate BMI. Ideal Body Weight:    GEN: no acute distress. HEENT: Atraumatic, Normocephalic.  Ears and Nose: No external deformity. CV: RRR, No M/G/R. No JVD. No thrill. No extra heart sounds. PULM: CTA B, no wheezes, crackles, rhonchi. No retractions. No resp. distress. No accessory muscle use. ABD: S, NT, ND, +BS. No rebound. No HSM. EXTR: No c/c/e PSYCH: Normally interactive. Conversant.    Assessment and Plan: ***  Signed Lamar Blinks, MD

## 2021-01-20 ENCOUNTER — Ambulatory Visit: Payer: Medicare Other | Admitting: Family Medicine

## 2021-01-22 ENCOUNTER — Ambulatory Visit (INDEPENDENT_AMBULATORY_CARE_PROVIDER_SITE_OTHER): Payer: Medicare Other | Admitting: Medical

## 2021-01-22 VITALS — BP 138/74 | HR 95 | Temp 98.4°F | Wt 192.2 lb

## 2021-01-22 DIAGNOSIS — J189 Pneumonia, unspecified organism: Secondary | ICD-10-CM

## 2021-01-22 DIAGNOSIS — H6981 Other specified disorders of Eustachian tube, right ear: Secondary | ICD-10-CM | POA: Diagnosis not present

## 2021-01-22 DIAGNOSIS — M6740 Ganglion, unspecified site: Secondary | ICD-10-CM

## 2021-01-22 DIAGNOSIS — K649 Unspecified hemorrhoids: Secondary | ICD-10-CM | POA: Diagnosis not present

## 2021-01-22 NOTE — Patient Instructions (Addendum)
Pneumonia that was clinical dx. Presently you report feeling much better and lungs are clear. I don't think you need cxr presently. If you have recurrent signs and symptoms let me know. Would get cxr in that event.  Intermittent rt ear pressure with hx of allergies. Recommend that you use flonase.  Possible ganglion cyst at rt thumb base and 4 th digit.  On exam looks like you have very subtle very small  non thormbosed hemorrhoid. Will provide education sheet on hemorrhoid. If you every get flare/thrombosed  let us know.  Follow up as regularly scheduled with pcp or sooner if needed.   Hemorrhoids Hemorrhoids are swollen veins in and around the rectum or anus. There are two types of hemorrhoids: Internal hemorrhoids. These occur in the veins that are just inside the rectum. They may poke through to the outside and become irritated and painful. External hemorrhoids. These occur in the veins that are outside the anus and can be felt as a painful swelling or hard lump near the anus. Most hemorrhoids do not cause serious problems, and they can be managed with home treatments such as diet and lifestyle changes. If home treatments do not help the symptoms, procedures can be done to shrink or remove the hemorrhoids. What are the causes? This condition is caused by increased pressure in the anal area. This pressure may result from various things, including: Constipation. Straining to have a bowel movement. Diarrhea. Pregnancy. Obesity. Sitting for long periods of time. Heavy lifting or other activity that causes you to strain. Anal sex. Riding a bike for a long period of time. What are the signs or symptoms? Symptoms of this condition include: Pain. Anal itching or irritation. Rectal bleeding. Leakage of stool (feces). Anal swelling. One or more lumps around the anus. How is this diagnosed? This condition can often be diagnosed through a visual exam. Other exams or tests may also be  done, such as: An exam that involves feeling the rectal area with a gloved hand (digital rectal exam). An exam of the anal canal that is done using a small tube (anoscope). A blood test, if you have lost a significant amount of blood. A test to look inside the colon using a flexible tube with a camera on the end (sigmoidoscopy or colonoscopy). How is this treated? This condition can usually be treated at home. However, various procedures may be done if dietary changes, lifestyle changes, and other home treatments do not help your symptoms. These procedures can help make the hemorrhoids smaller or remove them completely. Some of these procedures involve surgery, and others do not. Common procedures include: Rubber band ligation. Rubber bands are placed at the base of the hemorrhoids to cut off their blood supply. Sclerotherapy. Medicine is injected into the hemorrhoids to shrink them. Infrared coagulation. A type of light energy is used to get rid of the hemorrhoids. Hemorrhoidectomy surgery. The hemorrhoids are surgically removed, and the veins that supply them are tied off. Stapled hemorrhoidopexy surgery. The surgeon staples the base of the hemorrhoid to the rectal wall. Follow these instructions at home: Eating and drinking  Eat foods that have a lot of fiber in them, such as whole grains, beans, nuts, fruits, and vegetables. Ask your health care provider about taking products that have added fiber (fiber supplements). Reduce the amount of fat in your diet. You can do this by eating low-fat dairy products, eating less red meat, and avoiding processed foods. Drink enough fluid to keep your urine pale  yellow. Managing pain and swelling  Take warm sitz baths for 20 minutes, 3-4 times a day to ease pain and discomfort. You may do this in a bathtub or using a portable sitz bath that fits over the toilet. If directed, apply ice to the affected area. Using ice packs between sitz baths may be  helpful. Put ice in a plastic bag. Place a towel between your skin and the bag. Leave the ice on for 20 minutes, 2-3 times a day. General instructions Take over-the-counter and prescription medicines only as told by your health care provider. Use medicated creams or suppositories as told. Get regular exercise. Ask your health care provider how much and what kind of exercise is best for you. In general, you should do moderate exercise for at least 30 minutes on most days of the week (150 minutes each week). This can include activities such as walking, biking, or yoga. Go to the bathroom when you have the urge to have a bowel movement. Do not wait. Avoid straining to have bowel movements. Keep the anal area dry and clean. Use wet toilet paper or moist towelettes after a bowel movement. Do not sit on the toilet for long periods of time. This increases blood pooling and pain. Keep all follow-up visits as told by your health care provider. This is important. Contact a health care provider if you have: Increasing pain and swelling that are not controlled by treatment or medicine. Difficulty having a bowel movement, or you are unable to have a bowel movement. Pain or inflammation outside the area of the hemorrhoids. Get help right away if you have: Uncontrolled bleeding from your rectum. Summary Hemorrhoids are swollen veins in and around the rectum or anus. Most hemorrhoids can be managed with home treatments such as diet and lifestyle changes. Taking warm sitz baths can help ease pain and discomfort. In severe cases, procedures or surgery can be done to shrink or remove the hemorrhoids. This information is not intended to replace advice given to you by your health care provider. Make sure you discuss any questions you have with your health care provider. Document Revised: 07/22/2020 Document Reviewed: 07/22/2020 Elsevier Patient Education  [place].

## 2021-01-22 NOTE — Progress Notes (Signed)
Subjective:    Patient ID: Jonathan Garrison, male    DOB: 1945/01/31, 75 y.o.   MRN: [id number]  HPI Pt in for follow up post flu.   Pt got flu vaccine one month ago.   2 weeks was dx of flu(clincal dx but test came back negative.. Pt was not given tamiflu as he was not aware and passed the 2 day window time frame.   Pt tested negative for covid day of UC.    He also was given Doxycycline and prednisone. Chest xray was not done. Though dx community aquired pneumonia rt lower lobe. Pt was wheezing little bit told some rattling in lung.   Pt state he feel a lot better. His stats about 95% better last 3 days.  States christmas day felt sweaty and tired.  Pt has hx of some hemorrhoids. Gi MD told him on colonscopy he had hemorrhoids. No hemorrhoid flare type pain presently. He has never mentioned this to his pcp but he wants me to look at area.  Also rt hand small lump base of thumb and 4th digit rt hand.   Review of Systems  Constitutional:  Negative for chills, fatigue and fever.  HENT:  Negative for congestion and drooling.   Respiratory:  Negative for cough, chest tightness, shortness of breath and wheezing.   Cardiovascular:  Negative for chest pain and palpitations.  Gastrointestinal:  Negative for abdominal pain, constipation, nausea and vomiting.  Genitourinary:  Negative for dysuria and flank pain.  Musculoskeletal:  Negative for back pain, gait problem and neck pain.  Skin:  Negative for rash.  Neurological:  Negative for dizziness and headaches.  Hematological:  Negative for adenopathy. Does not bruise/bleed easily.  Psychiatric/Behavioral:  Negative for behavioral problems and dysphoric mood.     Past Medical History:  Diagnosis Date   ADD (attention deficit disorder) without hyperactivity    Allergy    Anemia    no treatment   Anxiety    Arthralgia    many joints, managed with vitamins/herbs and ibuprofen   Arthritis    Chest pain    Colitis 1986   single  attack (believes was ulcerative)   Depression    Eczema    Esophageal stricture    GERD (gastroesophageal reflux disease)    Hemorrhoids    Lichen planus    Neuromuscular disorder ([place])    POLIO AGE 14   Oral cancer ([place]) 05/2010   plans for surgery 06/2010   Osteoporosis    hips   Pleurisy 1984   Polio age 46   Umbilical hernia      Social History   Socioeconomic History   Marital status: Significant Other    Spouse name: Not on file   Number of children: Not on file   Years of education: Not on file   Highest education level: Not on file  Occupational History   Occupation: Psychologist, 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)    Employer: [employer]  Tobacco Use   Smoking status: Former    Types: Pipe, Cigarettes    Quit date: 01/25/1976    Years since quitting: 45.0   Smokeless tobacco: Never   Tobacco comments:    Quit smoking 35 years ago  Vaping Use   Vaping Use: Never used  Substance and Sexual Activity   Alcohol use: Yes    Alcohol/week: 7.0 - 14.0 standard drinks    Types: 7 - 14 Glasses of wine per week   Drug use: No  Sexual activity: Not on file  Other Topics Concern   Not on file  Social History Narrative   Not on file   Social Determinants of Health   Financial Resource Strain: Not on file  Food Insecurity: Not on file  Transportation Needs: Not on file  Physical Activity: Not on file  Stress: Not on file  Social Connections: Not on file  Intimate Partner Violence: Not on file    Past Surgical History:  Procedure Laterality Date   COLONOSCOPY  2012   LEG SURGERY  when in 6th grade   L leg, bone spur removal (just above knee, laterally)   TONGUE SURGERY     for tongue cancer   UPPER GASTROINTESTINAL ENDOSCOPY      Family History  Problem Relation Age of Onset   Hypertension Mother    Diabetes Mother    Hyperlipidemia Mother    Hypertension Father    Benign prostatic hyperplasia Father    Heart disease Father    Lichen planus Sister    Cancer  Sister        ?spot on chest? contained   Diabetes Maternal Grandfather    Colon cancer Neg Hx    Esophageal cancer Neg Hx    Stomach cancer Neg Hx    Pancreatic cancer Neg Hx    Liver disease Neg Hx    Colon polyps Neg Hx    Rectal cancer Neg Hx     Allergies  Allergen Reactions   Penicillins Other (See Comments)    REACTION: ?    Current Outpatient Medications on File Prior to Visit  Medication Sig Dispense Refill   amphetamine-dextroamphetamine (ADDERALL) 20 MG tablet Take 1 tablet (20 mg total) by mouth 2 (two) times daily. 60 tablet 0   aspirin 81 MG tablet Take 81 mg by mouth daily.     b complex vitamins tablet Take 0.5 tablets by mouth daily.     Biotin 5000 MCG CAPS Take 1 capsule by mouth daily.     Calcium-Magnesium-Zinc 409-81-1 MG TABS Take by mouth. 1 tablet daily     chlorpheniramine (CHLOR-TRIMETON) 4 MG tablet Take 2 mg by mouth every 6 (six) hours as needed.     Cholecalciferol (VITAMIN D3) 2000 UNITS capsule Take 2,000 Units by mouth daily.     COVID-19 mRNA bivalent vaccine, Pfizer, (PFIZER COVID-19 VAC BIVALENT) injection Inject into the muscle. 0.3 mL 0   ferrous sulfate 324 (65 Fe) MG TBEC Take one every other day for low iron 45 tablet 3   fluticasone (FLONASE) 50 MCG/ACT nasal spray SHAKE LIQUID AND USE 2 SPRAYS IN EACH NOSTRIL DAILY 16 g 11   glucosamine-chondroitin 500-400 MG tablet Take 1 tablet by mouth 2 (two) times daily.     meloxicam (MOBIC) 15 MG tablet Take 1 tablet (15 mg total) by mouth daily as needed. 90 tablet 3   Multiple Vitamins-Minerals (MULTIVITAMIN WITH MINERALS) tablet Take 1 tablet by mouth daily.     NON FORMULARY ALJ -herbal decongestant  Takes as needed     omeprazole (PRILOSEC) 40 MG capsule Take 1 capsule (40 mg total) by mouth daily. 90 capsule 3   sildenafil (REVATIO) 20 MG tablet Take 2-3 tablets one hour prior to intercourse 60 tablet 5   tacrolimus (PROTOPIC) 0.1 % ointment Apply topically daily. 100 g 2   Zn-Pyg  Afri-Nettle-Saw Palmet (SAW PALMETTO COMPLEX PO) Take 1 tablet by mouth 2 (two) times daily.     Fluorouracil (TOLAK) 4 % CREA  Total of 28 applications to the area (Patient not taking: Reported on 10/06/2020) 40 g 0   No current facility-administered medications on file prior to visit.    BP 138/74    Pulse 95    Temp 98.4 F (36.9 C)    Wt 192 lb 3.2 oz (87.2 kg)    SpO2 96%    BMI 24.68 kg/m       Objective:   Physical Exam  General Mental Status- Alert. General Appearance- Not in acute distress.   Skin General: Color- Normal Color. Moisture- Normal Moisture.  Neck Carotid Arteries- Normal color. Moisture- Normal Moisture. No carotid bruits. No JVD.  Chest and Lung Exam Auscultation: Breath Sounds:-Normal.  Cardiovascular Auscultation:Rythm- Regular. Murmurs & Other Heart Sounds:Auscultation of the heart reveals- No Murmurs.  Abdomen Inspection:-Inspeection Normal. Palpation/Percussion:Note:No mass. Palpation and Percussion of the abdomen reveal- Non Tender, Non Distended + BS, no rebound or guarding.   Neurologic Cranial Nerve exam:- CN III-XII intact(No nystagmus), symmetric smile. Strength:- 5/5 equal and symmetric strength both upper and lower extremities.   Heent- canals clear and normal tm. No wax seen  Rt hand- at base of thumb. Possible small cyst. Also on ventral aspect of 4th digit.  Rectal area- laying on rt side. At 3 o'clock position.subtle very small  non thorombosed hemorrhoid.     Assessment & Plan:   Patient Instructions  Pneumonia that was clinical dx. Presently you report feeling much better and lungs are clear. I don't think you need cxr presently. If you have recurrent signs and symptoms let me know. Would get cxr in that event.  Intermittent rt ear pressure with hx of allergies. Recommend that you use flonase.  Possible ganglion cyst at rt thumb base and 4 th digit.  On exam looks like you have very subtle very small  non thormbosed  hemorrhoid. Will provide education sheet on hemorrhoid. If you every get flare/thrombosed  let us know.  Follow up as regularly scheduled with pcp or sooner if needed.       Mackie Pai, PA-C

## 2021-01-27 DIAGNOSIS — F422 Mixed obsessional thoughts and acts: Secondary | ICD-10-CM | POA: Diagnosis not present

## 2021-02-09 ENCOUNTER — Other Ambulatory Visit: Payer: Self-pay | Admitting: Internal Medicine

## 2021-02-21 ENCOUNTER — Encounter: Payer: Self-pay | Admitting: Family Medicine

## 2021-02-21 ENCOUNTER — Other Ambulatory Visit: Payer: Self-pay | Admitting: Family Medicine

## 2021-02-21 DIAGNOSIS — F988 Other specified behavioral and emotional disorders with onset usually occurring in childhood and adolescence: Secondary | ICD-10-CM

## 2021-02-21 DIAGNOSIS — N529 Male erectile dysfunction, unspecified: Secondary | ICD-10-CM

## 2021-02-21 MED ORDER — AMPHETAMINE-DEXTROAMPHETAMINE 20 MG PO TABS: 20.0000 mg | ORAL_TABLET | Freq: Two times a day (BID) | ORAL | 0 refills | Status: DC

## 2021-02-21 MED ORDER — SILDENAFIL CITRATE 20 MG PO TABS: ORAL_TABLET | ORAL | 5 refills | Status: AC

## 2021-02-25 ENCOUNTER — Other Ambulatory Visit: Payer: Self-pay | Admitting: Family Medicine

## 2021-02-25 DIAGNOSIS — M542 Cervicalgia: Secondary | ICD-10-CM

## 2021-02-26 MED ORDER — DOXYCYCLINE HYCLATE 100 MG PO CAPS: 100.0000 mg | ORAL_CAPSULE | Freq: Two times a day (BID) | ORAL | 0 refills | Status: DC

## 2021-02-26 NOTE — Addendum Note (Signed)
Addended by: Lamar Blinks C on: 02/26/2021 06:08 PM   Modules accepted: Orders

## 2021-03-03 DIAGNOSIS — F422 Mixed obsessional thoughts and acts: Secondary | ICD-10-CM | POA: Diagnosis not present

## 2021-03-07 MED ORDER — AMOXICILLIN-POT CLAVULANATE 875-125 MG PO TABS: 1.0000 | ORAL_TABLET | Freq: Two times a day (BID) | ORAL | 0 refills | Status: DC

## 2021-03-07 NOTE — Addendum Note (Signed)
Addended by: Lamar Blinks C on: 03/07/2021 05:15 PM   Modules accepted: Orders

## 2021-03-10 DIAGNOSIS — H40013 Open angle with borderline findings, low risk, bilateral: Secondary | ICD-10-CM | POA: Diagnosis not present

## 2021-03-15 NOTE — Patient Instructions (Addendum)
Good to see you again today- I will be in touch with your labs asap   Please stop by x-ray on the way out to have your knees x-rayed  [facility] can be a good resource for those who love someone with an addiction   Please consider getting the shingles vaccine at your pharmacy if not done already

## 2021-03-15 NOTE — Progress Notes (Signed)
[facility] [address] [place], [place] [address] [phone]  Date:  03/18/2021   Name:  Jonathan Garrison   DOB:  09-30-1945   MRN:  [id number]  PCP:  Darreld Mclean, MD    Chief Complaint: Annual Exam (Concerns/ questions: 1. R ear seems "dull" since wearing hearing aids. 2. Still has discomfort in hips and back. 3. Now has knee pain, asks about X-Rays. 4. Nail infection on middle left finger. 5 reddness on lower eyelids/Zoster: None in ncir)   History of Present Illness:  Jonathan Garrison is a 76 y.o. very pleasant male patient who presents with the following:  Patient seen today for an annual exam/Medicare " physical" History of anxiety, depression, oral cancer, osteopenia, B12 deficiency, and GERD.  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 Most recently seen by myself in May-we exchanged approximately a dozen MyChart messages since that time He recently contacted me with concern of left fingertip infection, I called in Augmentin- this is finally better and he is finishing up his augmentin rx  He has hearing aids but is not always using them   He did get sick in December was diagnosed with a community acquired pneumonia at urgent care-treated with doxycycline and prednisone  Shingrix  Most recent lab work on chart from May He was seen by oral maxillofacial surgery for follow-up in [employer] for history of tongue cancer ASSESSMENT: Jonathan Garrison is a 76 y.o. 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 The patient is overall doing well with healed wound bed of the left lateral tongue without evidence of new lesions. They will benefit from sharp cusp reduction of the lingual cusps of the left mandibular dentition.  PLAN:  -Reduction of mesial lingual cusp of #19 by general dentist -Follow-up  05/20/2021  Lab Results  Component Value Date   PSA 1.07 01/29/2020   PSA 1.00 05/21/2019   PSA 1.11 07/19/2018   He uses meloxicam daily- using tylenol he thinks at night as well He has pain in his knees-  He is still getting a lot of exercise- walking, hiking, kayaking, basketball We did hip and lumbar spine films 3/22- mild degenerative change  He notes the right knee is worse- we can get x-rays today  He is not aware of any particular injury - he does note that after he plays basketball he will have more pain   His GF is drinking too much - this is causing some friction between them   Patient Active Problem List   Diagnosis Date Noted   Increased prostate specific antigen (PSA) velocity 11/27/2017   Osteopenia 08/01/2017   Vitamin B12 deficiency anemia 06/09/2014   Chest pain 03/13/2013   Depression with anxiety 08/26/2011   Overweight (BMI 25.0-29.9) 08/26/2011   Joint pain 08/26/2011   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   Oral cancer ([place]) 07/09/2010    Past Medical History:  Diagnosis Date   ADD (attention deficit disorder) without hyperactivity    Allergy    Anemia    no treatment   Anxiety    Arthralgia    many joints, managed with vitamins/herbs and ibuprofen   Arthritis    Chest pain    Colitis 1986   single attack (believes was ulcerative)   Depression    Eczema    Esophageal  stricture    GERD (gastroesophageal reflux disease)    Hemorrhoids    Lichen planus    Neuromuscular disorder ([place])    POLIO AGE 7   Oral cancer ([place]) 05/2010   plans for surgery 06/2010   Osteoporosis    hips   Pleurisy 1984   Polio age 70   Umbilical hernia     Past Surgical History:  Procedure Laterality Date   COLONOSCOPY  2012   LEG SURGERY  when in 6th grade   L leg, bone spur removal (just above knee, laterally)   TONGUE SURGERY     for tongue cancer   UPPER GASTROINTESTINAL ENDOSCOPY      Social History   Tobacco Use   Smoking status: Former     Types: Pipe, Cigarettes    Quit date: 01/25/1976    Years since quitting: 45.1   Smokeless tobacco: Never   Tobacco comments:    Quit smoking 35 years ago  Vaping Use   Vaping Use: Never used  Substance Use Topics   Alcohol use: Yes    Alcohol/week: 7.0 - 14.0 standard drinks    Types: 7 - 14 Glasses of wine per week   Drug use: No    Family History  Problem Relation Age of Onset   Hypertension Mother    Diabetes Mother    Hyperlipidemia Mother    Hypertension Father    Benign prostatic hyperplasia Father    Heart disease Father    Lichen planus Sister    Cancer Sister        ?spot on chest? contained   Diabetes Maternal Grandfather    Colon cancer Neg Hx    Esophageal cancer Neg Hx    Stomach cancer Neg Hx    Pancreatic cancer Neg Hx    Liver disease Neg Hx    Colon polyps Neg Hx    Rectal cancer Neg Hx     No Active Allergies  Medication list has been reviewed and updated.  Current Outpatient Medications on File Prior to Visit  Medication Sig Dispense Refill   amoxicillin-clavulanate (AUGMENTIN) 875-125 MG tablet Take 1 tablet by mouth 2 (two) times daily. 20 tablet 0   amphetamine-dextroamphetamine (ADDERALL) 20 MG tablet Take 1 tablet (20 mg total) by mouth 2 (two) times daily. 60 tablet 0   aspirin 81 MG tablet Take 81 mg by mouth daily.     b complex vitamins tablet Take 0.5 tablets by mouth daily.     Biotin 5000 MCG CAPS Take 1 capsule by mouth daily.     Calcium-Magnesium-Zinc 502-77-4 MG TABS Take by mouth. 1 tablet daily     chlorpheniramine (CHLOR-TRIMETON) 4 MG tablet Take 2 mg by mouth every 6 (six) hours as needed.     Cholecalciferol (VITAMIN D3) 2000 UNITS capsule Take 2,000 Units by mouth daily.     COVID-19 mRNA bivalent vaccine, Pfizer, (PFIZER COVID-19 VAC BIVALENT) injection Inject into the muscle. 0.3 mL 0   doxycycline (VIBRAMYCIN) 100 MG capsule Take 1 capsule (100 mg total) by mouth 2 (two) times daily. 20 capsule 0   ferrous sulfate 324 (65  Fe) MG TBEC Take one every other day for low iron 45 tablet 3   Fluorouracil (TOLAK) 4 % CREA Total of 28 applications to the area 40 g 0   fluticasone (FLONASE) 50 MCG/ACT nasal spray SHAKE LIQUID AND USE 2 SPRAYS IN EACH NOSTRIL DAILY 16 g 11   glucosamine-chondroitin 500-400 MG tablet Take 1 tablet  by mouth 2 (two) times daily.     meloxicam (MOBIC) 15 MG tablet TAKE 1 TABLET(15 MG) BY MOUTH DAILY AS NEEDED 90 tablet 3   Multiple Vitamins-Minerals (MULTIVITAMIN WITH MINERALS) tablet Take 1 tablet by mouth daily.     NON FORMULARY ALJ -herbal decongestant  Takes as needed     omeprazole (PRILOSEC) 40 MG capsule TAKE 1 CAPSULE(40 MG) BY MOUTH DAILY 90 capsule 3   sildenafil (REVATIO) 20 MG tablet Take 2-3 tablets one hour prior to intercourse 60 tablet 5   tacrolimus (PROTOPIC) 0.1 % ointment Apply topically daily. 100 g 2   Zn-Pyg Afri-Nettle-Saw Palmet (SAW PALMETTO COMPLEX PO) Take 1 tablet by mouth 2 (two) times daily.     No current facility-administered medications on file prior to visit.    Review of Systems:  As per HPI- otherwise negative.   Physical Examination: Vitals:   03/18/21 0919  BP: 120/72  Pulse: 71  Resp: 18  Temp: 99 F (37.2 C)  SpO2: 98%   Vitals:   03/18/21 0919  Weight: 194 lb 12.8 oz (88.4 kg)  Height: 6\' 2"  (1.88 m)   Body mass index is 25.01 kg/m. Ideal Body Weight: Weight in (lb) to have BMI = 25: 194.3  GEN: no acute distress.  Normal weight, looks well  HEENT: Atraumatic, Normocephalic.  Bilateral TM wnl, oropharynx normal.  PEERL,EOMI. ear canals are clear Ears and Nose: No external deformity. CV: RRR, No M/G/R. No JVD. No thrill. No extra heart sounds. PULM: CTA B, no wheezes, crackles, rhonchi. No retractions. No resp. distress. No accessory muscle use. ABD: S, NT, ND, +BS. No rebound. No HSM. EXTR: No c/c/e PSYCH: Normally interactive. Conversant.  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  Patient wonders about more antibiotics, I advised at this time I do not think necessary  Assessment and Plan: Attention deficit disorder, unspecified hyperactivity presence  Oral cancer ([place])  Anemia due to vitamin B12 deficiency, unspecified B12 deficiency type - Plan: CBC, B12  Increased prostate specific antigen (PSA) velocity - Plan: PSA, Medicare ( [facility] only)  Screening for hyperlipidemia - Plan: Lipid panel  Medication monitoring encounter - Plan: CBC, Comprehensive metabolic panel  Elevated glucose - Plan: Hemoglobin A1c  History of iron deficiency - Plan: Ferritin  Chronic pain of both knees - Plan: DG Knee Complete 4 Views Left, DG Knee Complete 4 Views Right  Screening for prostate cancer - Plan: PSA, Medicare ( [place] [facility] only)  Patient seen today for a follow-up visit Labs are pending as above His oral cancer is being closely monitored as described in HPI He notes chronic pain of both knees, he is still very active and enjoys playing basketball.  He wonders about arthritis.  We will obtain plain films of his knees today He will consult with his ENT about hearing changes  Signed Lamar Blinks, MD  Received labs as below, message to patient  Results for orders placed or performed in visit on 03/18/21  CBC  Result Value Ref Range   WBC 4.5 4.0 - 10.5 K/uL   RBC 4.19 (L) 4.22 - 5.81 Mil/uL   Platelets 281.0 150.0 - 400.0 K/uL   Hemoglobin 12.9 (L) 13.0 - 17.0 g/dL   HCT 38.6 (L) 39.0 - 52.0 %   MCV 92.2 78.0 - 100.0 fl   MCHC  33.4 30.0 - 36.0 g/dL   RDW 14.4 11.5 - 15.5 %  Comprehensive metabolic panel  Result Value Ref Range   Sodium 141 135 - 145 mEq/L   Potassium 4.5 3.5 - 5.1 mEq/L   Chloride 105 96 - 112 mEq/L   CO2 31 19 - 32 mEq/L   Glucose, Bld 90 70 - 99 mg/dL   BUN 31 (H) 6 - 23 mg/dL   Creatinine, Ser 0.93 0.40 - 1.50 mg/dL   Total  Bilirubin 0.6 0.2 - 1.2 mg/dL   Alkaline Phosphatase 56 39 - 117 U/L   AST 31 0 - 37 U/L   ALT 23 0 - 53 U/L   Total Protein 6.3 6.0 - 8.3 g/dL   Albumin 4.2 3.5 - 5.2 g/dL   GFR 80.82 >60.00 mL/min   Calcium 9.2 8.4 - 10.5 mg/dL  Hemoglobin A1c  Result Value Ref Range   Hgb A1c MFr Bld 5.2 4.6 - 6.5 %  Lipid panel  Result Value Ref Range   Cholesterol 179 0 - 200 mg/dL   Triglycerides 48.0 0.0 - 149.0 mg/dL   HDL 74.70 >39.00 mg/dL   VLDL 9.6 0.0 - 40.0 mg/dL   LDL Cholesterol 95 0 - 99 mg/dL   Total CHOL/HDL Ratio 2    NonHDL 104.41   PSA, Medicare ( [place] [facility] only)  Result Value Ref Range   PSA 0.91 0.10 - 4.00 ng/ml  B12  Result Value Ref Range   Vitamin B-12 313 211 - 911 pg/mL  Ferritin  Result Value Ref Range   Ferritin 44.2 22.0 - 322.0 ng/mL

## 2021-03-18 ENCOUNTER — Ambulatory Visit (INDEPENDENT_AMBULATORY_CARE_PROVIDER_SITE_OTHER): Payer: Medicare Other | Admitting: Family Medicine

## 2021-03-18 ENCOUNTER — Encounter: Payer: Self-pay | Admitting: Family Medicine

## 2021-03-18 ENCOUNTER — Ambulatory Visit (HOSPITAL_BASED_OUTPATIENT_CLINIC_OR_DEPARTMENT_OTHER)
Admission: RE | Admit: 2021-03-18 | Discharge: 2021-03-18 | Disposition: A | Discharge status: Home / Self Care | Payer: Medicare Other | Source: Ambulatory Visit | Attending: Family Medicine | Admitting: Family Medicine

## 2021-03-18 ENCOUNTER — Other Ambulatory Visit: Payer: Self-pay

## 2021-03-18 VITALS — BP 120/72 | HR 71 | Temp 99.0°F | Resp 18 | Ht 74.0 in | Wt 194.8 lb

## 2021-03-18 DIAGNOSIS — D519 Vitamin B12 deficiency anemia, unspecified: Secondary | ICD-10-CM

## 2021-03-18 DIAGNOSIS — M25561 Pain in right knee: Secondary | ICD-10-CM

## 2021-03-18 DIAGNOSIS — R972 Elevated prostate specific antigen [PSA]: Secondary | ICD-10-CM

## 2021-03-18 DIAGNOSIS — G8929 Other chronic pain: Secondary | ICD-10-CM | POA: Diagnosis not present

## 2021-03-18 DIAGNOSIS — Z125 Encounter for screening for malignant neoplasm of prostate: Secondary | ICD-10-CM

## 2021-03-18 DIAGNOSIS — D649 Anemia, unspecified: Secondary | ICD-10-CM

## 2021-03-18 DIAGNOSIS — C069 Malignant neoplasm of mouth, unspecified: Secondary | ICD-10-CM | POA: Diagnosis not present

## 2021-03-18 DIAGNOSIS — F988 Other specified behavioral and emotional disorders with onset usually occurring in childhood and adolescence: Secondary | ICD-10-CM

## 2021-03-18 DIAGNOSIS — M25562 Pain in left knee: Secondary | ICD-10-CM

## 2021-03-18 DIAGNOSIS — Z5181 Encounter for therapeutic drug level monitoring: Secondary | ICD-10-CM | POA: Diagnosis not present

## 2021-03-18 DIAGNOSIS — Z8639 Personal history of other endocrine, nutritional and metabolic disease: Secondary | ICD-10-CM

## 2021-03-18 DIAGNOSIS — R7309 Other abnormal glucose: Secondary | ICD-10-CM | POA: Diagnosis not present

## 2021-03-18 DIAGNOSIS — Z13220 Encounter for screening for lipoid disorders: Secondary | ICD-10-CM | POA: Diagnosis not present

## 2021-03-18 LAB — CBC
HCT: 38.6 % — ABNORMAL LOW (ref 39.0–52.0)
Hemoglobin: 12.9 g/dL — ABNORMAL LOW (ref 13.0–17.0)
MCHC: 33.4 g/dL (ref 30.0–36.0)
MCV: 92.2 fl (ref 78.0–100.0)
Platelets: 281 10*3/uL (ref 150.0–400.0)
RBC: 4.19 Mil/uL — ABNORMAL LOW (ref 4.22–5.81)
RDW: 14.4 % (ref 11.5–15.5)
WBC: 4.5 10*3/uL (ref 4.0–10.5)

## 2021-03-18 LAB — COMPREHENSIVE METABOLIC PANEL
ALT: 23 U/L (ref 0–53)
AST: 31 U/L (ref 0–37)
Albumin: 4.2 g/dL (ref 3.5–5.2)
Alkaline Phosphatase: 56 U/L (ref 39–117)
BUN: 31 mg/dL — ABNORMAL HIGH (ref 6–23)
CO2: 31 mEq/L (ref 19–32)
Calcium: 9.2 mg/dL (ref 8.4–10.5)
Chloride: 105 mEq/L (ref 96–112)
Creatinine, Ser: 0.93 mg/dL (ref 0.40–1.50)
GFR: 80.82 mL/min (ref >60.00)
Glucose, Bld: 90 mg/dL (ref 70–99)
Potassium: 4.5 mEq/L (ref 3.5–5.1)
Sodium: 141 mEq/L (ref 135–145)
Total Bilirubin: 0.6 mg/dL (ref 0.2–1.2)
Total Protein: 6.3 g/dL (ref 6.0–8.3)

## 2021-03-18 LAB — LIPID PANEL
Cholesterol: 179 mg/dL (ref 0–200)
HDL: 74.7 mg/dL (ref >39.00)
LDL Cholesterol: 95 mg/dL (ref 0–99)
NonHDL: 104.41
Total CHOL/HDL Ratio: 2
Triglycerides: 48 mg/dL (ref 0.0–149.0)
VLDL: 9.6 mg/dL (ref 0.0–40.0)

## 2021-03-18 LAB — VITAMIN B12: Vitamin B-12: 313 pg/mL (ref 211–911)

## 2021-03-18 LAB — HEMOGLOBIN A1C: Hgb A1c MFr Bld: 5.2 % (ref 4.6–6.5)

## 2021-03-18 LAB — PSA, MEDICARE: PSA: 0.91 ng/ml (ref 0.10–4.00)

## 2021-03-18 LAB — FERRITIN: Ferritin: 44.2 ng/mL (ref 22.0–322.0)

## 2021-03-19 ENCOUNTER — Encounter: Payer: Self-pay | Admitting: Family Medicine

## 2021-03-25 ENCOUNTER — Ambulatory Visit (INDEPENDENT_AMBULATORY_CARE_PROVIDER_SITE_OTHER): Payer: Medicare Other | Admitting: Internal Medicine

## 2021-03-25 ENCOUNTER — Encounter: Payer: Self-pay | Admitting: Internal Medicine

## 2021-03-25 VITALS — BP 130/82 | HR 89 | Ht 74.0 in | Wt 196.0 lb

## 2021-03-25 DIAGNOSIS — R1319 Other dysphagia: Secondary | ICD-10-CM

## 2021-03-25 DIAGNOSIS — K222 Esophageal obstruction: Secondary | ICD-10-CM

## 2021-03-25 DIAGNOSIS — K219 Gastro-esophageal reflux disease without esophagitis: Secondary | ICD-10-CM

## 2021-03-25 MED ORDER — OMEPRAZOLE 40 MG PO CPDR: 40.0000 mg | DELAYED_RELEASE_CAPSULE | Freq: Two times a day (BID) | ORAL | 3 refills | Status: DC

## 2021-03-25 NOTE — Patient Instructions (Addendum)
If you are age 75 or older, your body mass index should be between 23-30. Your Body mass index is 25.16 kg/m?Marland Kitchen If this is out of the aforementioned range listed, please consider follow up with your Primary Care Provider. ? ?If you are age 45 or younger, your body mass index should be between 19-25. Your Body mass index is 25.16 kg/m?Marland Kitchen If this is out of the aformentioned range listed, please consider follow up with your Primary Care Provider.  ? ?________________________________________________________ ? ?The [facility] providers would like to encourage you to use [facility] to communicate with providers for non-urgent requests or questions.  Due to long hold times on the telephone, sending your provider a message by [facility] may be a faster and more efficient way to get a response.  Please allow 48 business hours for a response.  Please remember that this is for non-urgent requests.  ?_______________________________________________________ ? ?You have been scheduled for an endoscopy. Please follow written instructions given to you at your visit today. ?If you use inhalers (even only as needed), please bring them with you on the day of your procedure. ? ? ? ?We have sent the following medications to your pharmacy for you to pick up at your convenience:  Omeprazole - twice a day ? ?

## 2021-03-25 NOTE — Progress Notes (Signed)
HISTORY OF PRESENT ILLNESS: ? ?Jonathan Garrison is a 76 y.o. male with past medical history as listed below.  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  I last saw the patient April 06, 2020 regarding minor rectal bleeding and GERD.  See that dictation.  His last colonoscopy was performed April 2022.  Exam was unremarkable. ?Presents today with chief complaint of worsening intermittent solid food and pill dysphagia over the past year.  He also reports breakthrough reflux symptoms, particularly at night.  He is currently taking omeprazole 40 mg daily.  His last upper endoscopy with esophageal dilation (balloon to 20 mm) was performed January 2020.  His GI review of systems is otherwise negative.  Review of outside blood work from March 18, 2021 revealed unremarkable comprehensive metabolic panel and CBC.  Hemoglobin 12.9. ? ?REVIEW OF SYSTEMS: ? ?All non-GI ROS negative unless otherwise stated in the HPI except for arthritis, back pain, skin rash, hearing problems ? ?Past Medical History:  ?Diagnosis Date  ? ADD (attention deficit disorder) without hyperactivity   ? Allergy   ? Anemia   ? no treatment  ? Anxiety   ? Arthralgia   ? many joints, managed with vitamins/herbs and ibuprofen  ? Arthritis   ? Chest pain   ? Colitis 1986  ? single attack (believes was ulcerative)  ? Depression   ? Eczema   ? Esophageal stricture   ? GERD (gastroesophageal reflux disease)   ? Hemorrhoids   ? Lichen planus   ? Neuromuscular disorder ([place])   ? POLIO AGE 52  ? Oral cancer ([place]) 05/2010  ? plans for surgery 06/2010  ? Osteoporosis   ? hips  ? Pleurisy 1984  ? Polio age 36  ? Umbilical hernia   ? ? ?Past Surgical History:  ?Procedure Laterality Date  ? COLONOSCOPY  2012  ? LEG SURGERY  when in 6th grade  ? L leg, bone spur removal (just above knee, laterally)  ? TONGUE SURGERY    ? for tongue cancer  ? UPPER GASTROINTESTINAL ENDOSCOPY    ? ? ?Social  History ?Jonathan Garrison  reports that he quit smoking about 45 years ago. His smoking use included pipe and cigarettes. He has never used smokeless tobacco. He reports current alcohol use of about 7.0 - 14.0 standard drinks per week. He reports that he does not use drugs. ? ?family history includes Benign prostatic hyperplasia in his father; Cancer in his sister; Diabetes in his maternal grandfather and mother; Heart disease in his father; Hyperlipidemia in his mother; Hypertension in his father and mother; Lichen planus in his sister. ? ?No Known Allergies ? ?  ? ?PHYSICAL EXAMINATION: ?Vital signs: BP 130/82   Pulse 89   Ht 6\' 2"  (1.88 m)   Wt 196 lb (88.9 kg)   BMI 25.16 kg/m?   ?Constitutional: generally well-appearing, no acute distress ?Psychiatric: alert and oriented x3, cooperative ?Eyes: extraocular movements intact, anicteric, conjunctiva pink ?Mouth: oral pharynx moist, no lesions ?Neck: supple no lymphadenopathy ?Cardiovascular: heart regular rate and rhythm, no murmur ?Lungs: clear to auscultation bilaterally ?Abdomen: soft, nontender, nondistended, no obvious ascites, no peritoneal signs, normal bowel sounds, no organomegaly ?Rectal: Omitted ?Extremities: no clubbing, cyanosis, or lower extremity edema bilaterally ?Skin: no lesions on visible extremities ?Neuro: No focal deficits.  Cranial nerves intact ? ?ASSESSMENT: ? ?1.  GERD complicated by erosive esophagitis and peptic stricture.  Currently experiencing breakthrough GERD symptoms on  omeprazole 40 mg once daily ?2.  Recurrent dysphagia.  Likely secondary to peptic stricture ?3.  Colon cancer screening.  Last colonoscopy 2022 was normal.  Aged out of future routine screening ? ? ?PLAN: ? ?1.  Reflux precautions ?2.  Increase omeprazole to 40 mg twice daily.  Prescribed.  Medication risks reviewed ?3.  Schedule upper endoscopy with esophageal dilation.The nature of the procedure, as well as the risks, benefits, and alternatives were carefully and  thoroughly reviewed with the patient. Ample time for discussion and questions allowed. The patient understood, was satisfied, and agreed to proceed.  ?4.  Office evaluations at least annually. ? ? ? ?  ?

## 2021-03-31 DIAGNOSIS — F422 Mixed obsessional thoughts and acts: Secondary | ICD-10-CM | POA: Diagnosis not present

## 2021-04-18 ENCOUNTER — Encounter: Payer: Self-pay | Admitting: Family Medicine

## 2021-04-22 DIAGNOSIS — K1329 Other disturbances of oral epithelium, including tongue: Secondary | ICD-10-CM | POA: Diagnosis not present

## 2021-05-03 ENCOUNTER — Encounter: Payer: Self-pay | Admitting: Family Medicine

## 2021-05-03 DIAGNOSIS — H66001 Acute suppurative otitis media without spontaneous rupture of ear drum, right ear: Secondary | ICD-10-CM | POA: Diagnosis not present

## 2021-05-07 ENCOUNTER — Ambulatory Visit (AMBULATORY_SURGERY_CENTER): Payer: Medicare Other | Admitting: Internal Medicine

## 2021-05-07 ENCOUNTER — Encounter: Payer: Self-pay | Admitting: Internal Medicine

## 2021-05-07 VITALS — BP 133/78 | HR 73 | Temp 98.9°F | Resp 9 | Ht 74.0 in | Wt 196.0 lb

## 2021-05-07 DIAGNOSIS — K219 Gastro-esophageal reflux disease without esophagitis: Secondary | ICD-10-CM

## 2021-05-07 DIAGNOSIS — R1310 Dysphagia, unspecified: Secondary | ICD-10-CM | POA: Diagnosis not present

## 2021-05-07 DIAGNOSIS — R1319 Other dysphagia: Secondary | ICD-10-CM | POA: Diagnosis not present

## 2021-05-07 DIAGNOSIS — K449 Diaphragmatic hernia without obstruction or gangrene: Secondary | ICD-10-CM

## 2021-05-07 DIAGNOSIS — K222 Esophageal obstruction: Secondary | ICD-10-CM

## 2021-05-07 MED ORDER — SODIUM CHLORIDE 0.9 % IV SOLN: 500.0000 mL | Freq: Once | INTRAVENOUS | Status: DC

## 2021-05-07 NOTE — Progress Notes (Signed)
Called to room to assist during endoscopic procedure.  Patient ID and intended procedure confirmed with present staff. Received instructions for my participation in the procedure from the performing physician. HISTORY OF PRESENT ILLNESS: ?  ?Jonathan Garrison is a 76 y.o. male with past medical history as listed below.  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  I last saw the patient April 06, 2020 regarding minor rectal bleeding and GERD.  See that dictation.  His last colonoscopy was performed April 2022.  Exam was unremarkable. ?Presents today with chief complaint of worsening intermittent solid food and pill dysphagia over the past year.  He also reports breakthrough reflux symptoms, particularly at night.  He is currently taking omeprazole 40 mg daily.  His last upper endoscopy with esophageal dilation (balloon to 20 mm) was performed January 2020.  His GI review of systems is otherwise negative.  Review of outside blood work from March 18, 2021 revealed unremarkable comprehensive metabolic panel and CBC.  Hemoglobin 12.9. ?  ?REVIEW OF SYSTEMS: ?  ?All non-GI ROS negative unless otherwise stated in the HPI except for arthritis, back pain, skin rash, hearing problems ?  ?    ?Past Medical History:  ?Diagnosis Date  ? ADD (attention deficit disorder) without hyperactivity    ? Allergy    ? Anemia    ?  no treatment  ? Anxiety    ? Arthralgia    ?  many joints, managed with vitamins/herbs and ibuprofen  ? Arthritis    ? Chest pain    ? Colitis 1986  ?  single attack (believes was ulcerative)  ? Depression    ? Eczema    ? Esophageal stricture    ? GERD (gastroesophageal reflux disease)    ? Hemorrhoids    ? Lichen planus    ? Neuromuscular disorder ([place])    ?  POLIO AGE 76  ? Oral cancer ([place]) 05/2010  ?  plans for surgery 06/2010  ? Osteoporosis    ?  hips  ? Pleurisy 1984  ? Polio age 76  ? Umbilical hernia    ?  ?  ?     ?Past  Surgical History:  ?Procedure Laterality Date  ? COLONOSCOPY   2012  ? LEG SURGERY   when in 6th grade  ?  L leg, bone spur removal (just above knee, laterally)  ? TONGUE SURGERY      ?  for tongue cancer  ? UPPER GASTROINTESTINAL ENDOSCOPY      ?  ?  ?Social History ?Jonathan Garrison  reports that he quit smoking about 45 years ago. His smoking use included pipe and cigarettes. He has never used smokeless tobacco. He reports current alcohol use of about 7.0 - 14.0 standard drinks per week. He reports that he does not use drugs. ?  ?family history includes Benign prostatic hyperplasia in his father; Cancer in his sister; Diabetes in his maternal grandfather and mother; Heart disease in his father; Hyperlipidemia in his mother; Hypertension in his father and mother; Lichen planus in his sister. ?  ?No Known Allergies ?  ?  ?  ?PHYSICAL EXAMINATION: ?Vital signs: BP 130/82   Pulse 89   Ht '6\' 2"'$  (1.88 m)   Wt 196 lb (88.9 kg)   BMI 25.16 kg/m?   ?Constitutional: generally well-appearing, no acute distress ?Psychiatric: alert and oriented x3, cooperative ?Eyes: extraocular movements intact, anicteric, conjunctiva pink ?Mouth: oral  pharynx moist, no lesions ?Neck: supple no lymphadenopathy ?Cardiovascular: heart regular rate and rhythm, no murmur ?Lungs: clear to auscultation bilaterally ?Abdomen: soft, nontender, nondistended, no obvious ascites, no peritoneal signs, normal bowel sounds, no organomegaly ?Rectal: Omitted ?Extremities: no clubbing, cyanosis, or lower extremity edema bilaterally ?Skin: no lesions on visible extremities ?Neuro: No focal deficits.  Cranial nerves intact ?  ?ASSESSMENT: ?  ?1.  GERD complicated by erosive esophagitis and peptic stricture.  Currently experiencing breakthrough GERD symptoms on omeprazole 40 mg once daily ?2.  Recurrent dysphagia.  Likely secondary to peptic stricture ?3.  Colon cancer screening.  Last colonoscopy 2022 was normal.  Aged out of future routine screening ?  ?   ?PLAN: ?  ?1.  Reflux precautions ?2.  Increase omeprazole to 40 mg twice daily.  Prescribed.  Medication risks reviewed ?3.  Schedule upper endoscopy with esophageal dilation.The nature of the procedure, as well as the risks, benefits, and alternatives were carefully and thoroughly reviewed with the patient. Ample time for discussion and questions allowed. The patient understood, was satisfied, and agreed to proceed.  ?4.  Office evaluations at least annually. ? ?See recent office evaluation for complete H&P as outlined above.  No interval clinical changes no changes in physical exam.  Now for upper endoscopy with dilation ?

## 2021-05-07 NOTE — Patient Instructions (Signed)
Handouts Provided:  Post Dilation Diet ? ?YOU HAD AN ENDOSCOPIC PROCEDURE TODAY AT THE [facility]:   Refer to the procedure report that was given to you for any specific questions about what was found during the examination.  If the procedure report does not answer your questions, please call your gastroenterologist to clarify.  If you requested that your care partner not be given the details of your procedure findings, then the procedure report has been included in a sealed envelope for you to review at your convenience later. ? ?YOU SHOULD EXPECT: Some feelings of bloating in the abdomen. Passage of more gas than usual.  Walking can help get rid of the air that was put into your GI tract during the procedure and reduce the bloating. If you had a lower endoscopy (such as a colonoscopy or flexible sigmoidoscopy) you may notice spotting of blood in your stool or on the toilet paper. If you underwent a bowel prep for your procedure, you may not have a normal bowel movement for a few days. ? ?Please Note:  You might notice some irritation and congestion in your nose or some drainage.  This is from the oxygen used during your procedure.  There is no need for concern and it should clear up in a day or so. ? ?SYMPTOMS TO REPORT IMMEDIATELY: ? ?Following upper endoscopy (EGD) ? Vomiting of blood or coffee ground material ? New chest pain or pain under the shoulder blades ? Painful or persistently difficult swallowing ? New shortness of breath ? Fever of 100?F or higher ? Black, tarry-looking stools ? ?For urgent or emergent issues, a gastroenterologist can be reached at any hour by calling [phone]. ?Do not use MyChart messaging for urgent concerns.  ? ? ?DIET:  See Post Dilation Diet provided.  Drink plenty of fluids but you should avoid alcoholic beverages for 24 hours. ? ?ACTIVITY:  You should plan to take it easy for the rest of today and you should NOT DRIVE or use heavy machinery until tomorrow  (because of the sedation medicines used during the test).   ? ?FOLLOW UP: ?Our staff will call the number listed on your records 48-72 hours following your procedure to check on you and address any questions or concerns that you may have regarding the information given to you following your procedure. If we do not reach you, we will leave a message.  We will attempt to reach you two times.  During this call, we will ask if you have developed any symptoms of COVID 19. If you develop any symptoms (ie: fever, flu-like symptoms, shortness of breath, cough etc.) before then, please call [phone].  If you test positive for Covid 19 in the 2 weeks post procedure, please call and report this information to us.   ? ?If any biopsies were taken you will be contacted by phone or by letter within the next 1-3 weeks.  Please call us at [phone] if you have not heard about the biopsies in 3 weeks.  ? ? ?SIGNATURES/CONFIDENTIALITY: ?You and/or your care partner have signed paperwork which will be entered into your electronic medical record.  These signatures attest to the fact that that the information above on your After Visit Summary has been reviewed and is understood.  Full responsibility of the confidentiality of this discharge information lies with you and/or your care-partner. ? ?

## 2021-05-07 NOTE — Progress Notes (Signed)
Sedate, gd SR, tolerated procedure well, VSS, report to RN 

## 2021-05-07 NOTE — Op Note (Signed)
[place] ?Patient Name: Jonathan Garrison ?Procedure Date: 05/07/2021 10:36 AM ?MRN: [id number] ?Endoscopist: Docia Chuck. Henrene Pastor , MD ?Age: 76 ?Referring MD:  ?Date of Birth: [birth year] ?Gender: Male ?Account #: [id number] ?Procedure:                Upper GI endoscopy with balloon dilation of the  ?                          esophagus. 18-20 mm max ?Indications:              Therapeutic procedure, Dysphagia, Esophageal reflux ?Medicines:                Monitored Anesthesia Care ?Procedure:                Pre-Anesthesia Assessment: ?                          - Prior to the procedure, a History and Physical  ?                          was performed, and patient medications and  ?                          allergies were reviewed. The patient's tolerance of  ?                          previous anesthesia was also reviewed. The risks  ?                          and benefits of the procedure and the sedation  ?                          options and risks were discussed with the patient.  ?                          All questions were answered, and informed consent  ?                          was obtained. Prior Anticoagulants: The patient has  ?                          taken no previous anticoagulant or antiplatelet  ?                          agents. ASA Grade Assessment: II - A patient with  ?                          mild systemic disease. After reviewing the risks  ?                          and benefits, the patient was deemed in  ?                          satisfactory condition to undergo the procedure. ?  After obtaining informed consent, the endoscope was  ?                          passed under direct vision. Throughout the  ?                          procedure, the patient's blood pressure, pulse, and  ?                          oxygen saturations were monitored continuously. The  ?                          GIF [id number] #[id number] was introduced through the  ?                          mouth,  and advanced to the second part of duodenum.  ?                          The upper GI endoscopy was accomplished without  ?                          difficulty. The patient tolerated the procedure  ?                          well. ?Scope In: ?Scope Out: ?Findings:                 The esophagus revealed a large caliber ringlike  ?                          stricture at the gastroesophageal junction (40 cm)  ?                          with a small diverticulum. No active inflammation..  ?                          After completing the endoscopic survey, A TTS  ?                          dilator was passed through the scope. Dilation with  ?                          an 18-19-20 mm balloon dilator was performed to 20  ?                          mm. No heme. ?                          The stomach was normal save a sliding hiatal hernia. ?                          The examined duodenum was normal. ?                          The cardia and gastric  fundus were normal on  ?                          retroflexion. ?Complications:            No immediate complications. ?Estimated Blood Loss:     Estimated blood loss: none. ?Impression:               1. Esophageal stricture status post dilation ?                          2. Helical hernia. Otherwise normal EGD ?                          3. GERD. ?Recommendation:           - Patient has a contact number available for  ?                          emergencies. The signs and symptoms of potential  ?                          delayed complications were discussed with the  ?                          patient. Return to normal activities tomorrow.  ?                          Written discharge instructions were provided to the  ?                          patient. ?                          - Post dilation diet. ?                          - Continue present medications. ?                          - Follow-up as needed for persistent or recurrent  ?                          difficulties.  Otherwise routine office follow-up 1  ?                          year ?Docia Chuck. Henrene Pastor, MD ?05/07/2021 11:04:35 AM ?This report has been signed electronically. ?

## 2021-05-11 ENCOUNTER — Telehealth: Payer: Self-pay

## 2021-05-11 NOTE — Telephone Encounter (Signed)
?  Follow up Call- ? ? ?  05/07/2021  ?  9:54 AM 05/14/2020  ?  2:54 PM 09/12/2019  ?  2:52 PM  ?Call back number  ?Post procedure Call Back phone  # [phone]  ?Permission to leave phone message Yes Yes Yes  ?  ? ?Patient questions: ? ?Do you have a fever, pain , or abdominal swelling? No. ?Pain Score  0 * ? ?Have you tolerated food without any problems? Yes.   ? ?Have you been able to return to your normal activities? Yes.   ? ?Do you have any questions about your discharge instructions: ?Diet   No. ?Medications  No. ?Follow up visit  No. ? ?Do you have questions or concerns about your Care? No. ? ?Actions: ?* If pain score is 4 or above: ?No action needed, pain <4. ? ? ?

## 2021-05-11 NOTE — Telephone Encounter (Signed)
Attempted follow up call. No answer, left VM. ?

## 2021-05-24 DIAGNOSIS — J302 Other seasonal allergic rhinitis: Secondary | ICD-10-CM | POA: Insufficient documentation

## 2021-05-24 DIAGNOSIS — H6983 Other specified disorders of Eustachian tube, bilateral: Secondary | ICD-10-CM | POA: Diagnosis not present

## 2021-05-24 DIAGNOSIS — H9193 Unspecified hearing loss, bilateral: Secondary | ICD-10-CM | POA: Diagnosis not present

## 2021-05-24 DIAGNOSIS — H6993 Unspecified Eustachian tube disorder, bilateral: Secondary | ICD-10-CM | POA: Insufficient documentation

## 2021-05-27 DIAGNOSIS — S6010XA Contusion of unspecified finger with damage to nail, initial encounter: Secondary | ICD-10-CM | POA: Diagnosis not present

## 2021-05-27 DIAGNOSIS — M7130 Other bursal cyst, unspecified site: Secondary | ICD-10-CM | POA: Diagnosis not present

## 2021-05-27 DIAGNOSIS — M6740 Ganglion, unspecified site: Secondary | ICD-10-CM | POA: Diagnosis not present

## 2021-05-27 DIAGNOSIS — K1329 Other disturbances of oral epithelium, including tongue: Secondary | ICD-10-CM | POA: Diagnosis not present

## 2021-05-27 DIAGNOSIS — L03032 Cellulitis of left toe: Secondary | ICD-10-CM | POA: Diagnosis not present

## 2021-06-02 DIAGNOSIS — F422 Mixed obsessional thoughts and acts: Secondary | ICD-10-CM | POA: Diagnosis not present

## 2021-06-03 ENCOUNTER — Encounter: Payer: Self-pay | Admitting: Family Medicine

## 2021-06-03 DIAGNOSIS — D649 Anemia, unspecified: Secondary | ICD-10-CM

## 2021-06-03 DIAGNOSIS — F988 Other specified behavioral and emotional disorders with onset usually occurring in childhood and adolescence: Secondary | ICD-10-CM

## 2021-06-08 ENCOUNTER — Other Ambulatory Visit: Payer: Medicare Other

## 2021-06-09 ENCOUNTER — Encounter: Payer: Self-pay | Admitting: Family Medicine

## 2021-06-09 ENCOUNTER — Other Ambulatory Visit (INDEPENDENT_AMBULATORY_CARE_PROVIDER_SITE_OTHER): Payer: Medicare Other

## 2021-06-09 DIAGNOSIS — D649 Anemia, unspecified: Secondary | ICD-10-CM

## 2021-06-09 LAB — CBC
HCT: 40.4 % (ref 39.0–52.0)
Hemoglobin: 13.3 g/dL (ref 13.0–17.0)
MCHC: 33 g/dL (ref 30.0–36.0)
MCV: 93 fl (ref 78.0–100.0)
Platelets: 274 10*3/uL (ref 150.0–400.0)
RBC: 4.35 Mil/uL (ref 4.22–5.81)
RDW: 14 % (ref 11.5–15.5)
WBC: 5.5 10*3/uL (ref 4.0–10.5)

## 2021-06-09 LAB — COMPREHENSIVE METABOLIC PANEL
ALT: 23 U/L (ref 0–53)
AST: 26 U/L (ref 0–37)
Albumin: 4.3 g/dL (ref 3.5–5.2)
Alkaline Phosphatase: 67 U/L (ref 39–117)
BUN: 29 mg/dL — ABNORMAL HIGH (ref 6–23)
CO2: 29 mEq/L (ref 19–32)
Calcium: 9.6 mg/dL (ref 8.4–10.5)
Chloride: 106 mEq/L (ref 96–112)
Creatinine, Ser: 0.91 mg/dL (ref 0.40–1.50)
GFR: 82.83 mL/min (ref >60.00)
Glucose, Bld: 81 mg/dL (ref 70–99)
Potassium: 4.5 mEq/L (ref 3.5–5.1)
Sodium: 142 mEq/L (ref 135–145)
Total Bilirubin: 0.9 mg/dL (ref 0.2–1.2)
Total Protein: 6.6 g/dL (ref 6.0–8.3)

## 2021-06-09 LAB — FERRITIN: Ferritin: 53.4 ng/mL (ref 22.0–322.0)

## 2021-06-09 MED ORDER — AMPHETAMINE-DEXTROAMPHETAMINE 20 MG PO TABS: 20.0000 mg | ORAL_TABLET | Freq: Two times a day (BID) | ORAL | 0 refills | Status: DC

## 2021-06-09 NOTE — Addendum Note (Signed)
Addended by: Lamar Blinks C on: 06/09/2021 07:30 PM ? ? Modules accepted: Orders ? ?

## 2021-06-15 ENCOUNTER — Other Ambulatory Visit (HOSPITAL_BASED_OUTPATIENT_CLINIC_OR_DEPARTMENT_OTHER): Payer: Self-pay

## 2021-06-23 DIAGNOSIS — F422 Mixed obsessional thoughts and acts: Secondary | ICD-10-CM | POA: Diagnosis not present

## 2021-07-08 DIAGNOSIS — K1329 Other disturbances of oral epithelium, including tongue: Secondary | ICD-10-CM | POA: Diagnosis not present

## 2021-07-08 DIAGNOSIS — K1321 Leukoplakia of oral mucosa, including tongue: Secondary | ICD-10-CM | POA: Diagnosis not present

## 2021-07-08 DIAGNOSIS — K121 Other forms of stomatitis: Secondary | ICD-10-CM | POA: Diagnosis not present

## 2021-07-09 ENCOUNTER — Encounter: Payer: Self-pay | Admitting: Family Medicine

## 2021-07-14 ENCOUNTER — Ambulatory Visit (INDEPENDENT_AMBULATORY_CARE_PROVIDER_SITE_OTHER): Payer: Medicare Other | Admitting: Dermatology

## 2021-07-14 ENCOUNTER — Encounter: Payer: Self-pay | Admitting: Dermatology

## 2021-07-14 DIAGNOSIS — Z1283 Encounter for screening for malignant neoplasm of skin: Secondary | ICD-10-CM | POA: Diagnosis not present

## 2021-07-14 DIAGNOSIS — Z85828 Personal history of other malignant neoplasm of skin: Secondary | ICD-10-CM | POA: Diagnosis not present

## 2021-07-14 DIAGNOSIS — L821 Other seborrheic keratosis: Secondary | ICD-10-CM | POA: Diagnosis not present

## 2021-07-14 DIAGNOSIS — L608 Other nail disorders: Secondary | ICD-10-CM | POA: Diagnosis not present

## 2021-07-14 DIAGNOSIS — L570 Actinic keratosis: Secondary | ICD-10-CM

## 2021-07-19 DIAGNOSIS — Z23 Encounter for immunization: Secondary | ICD-10-CM | POA: Diagnosis not present

## 2021-07-20 DIAGNOSIS — M25552 Pain in left hip: Secondary | ICD-10-CM | POA: Insufficient documentation

## 2021-07-21 DIAGNOSIS — M25561 Pain in right knee: Secondary | ICD-10-CM | POA: Diagnosis not present

## 2021-07-21 DIAGNOSIS — M7062 Trochanteric bursitis, left hip: Secondary | ICD-10-CM | POA: Diagnosis not present

## 2021-07-21 DIAGNOSIS — M25562 Pain in left knee: Secondary | ICD-10-CM | POA: Diagnosis not present

## 2021-07-21 DIAGNOSIS — M25552 Pain in left hip: Secondary | ICD-10-CM | POA: Diagnosis not present

## 2021-07-21 DIAGNOSIS — M25551 Pain in right hip: Secondary | ICD-10-CM | POA: Diagnosis not present

## 2021-07-21 DIAGNOSIS — M2241 Chondromalacia patellae, right knee: Secondary | ICD-10-CM | POA: Diagnosis not present

## 2021-07-22 DIAGNOSIS — F422 Mixed obsessional thoughts and acts: Secondary | ICD-10-CM | POA: Diagnosis not present

## 2021-08-01 DIAGNOSIS — M7062 Trochanteric bursitis, left hip: Secondary | ICD-10-CM | POA: Insufficient documentation

## 2021-08-04 ENCOUNTER — Ambulatory Visit: Payer: Medicare Other

## 2021-08-04 ENCOUNTER — Ambulatory Visit (INDEPENDENT_AMBULATORY_CARE_PROVIDER_SITE_OTHER): Payer: Medicare Other | Admitting: Podiatry

## 2021-08-04 DIAGNOSIS — M2041 Other hammer toe(s) (acquired), right foot: Secondary | ICD-10-CM

## 2021-08-06 DIAGNOSIS — H9193 Unspecified hearing loss, bilateral: Secondary | ICD-10-CM | POA: Diagnosis not present

## 2021-08-06 DIAGNOSIS — H6983 Other specified disorders of Eustachian tube, bilateral: Secondary | ICD-10-CM | POA: Diagnosis not present

## 2021-08-06 DIAGNOSIS — J302 Other seasonal allergic rhinitis: Secondary | ICD-10-CM | POA: Diagnosis not present

## 2021-08-06 DIAGNOSIS — H903 Sensorineural hearing loss, bilateral: Secondary | ICD-10-CM | POA: Diagnosis not present

## 2021-08-11 ENCOUNTER — Encounter: Payer: Self-pay | Admitting: Dermatology

## 2021-08-11 NOTE — Progress Notes (Signed)
   Follow-Up Visit   Subjective  Jonathan Garrison is a 76 y.o. male who presents for the following: Annual Exam (Personal history of scc patient has a list to go over with Dr Denna Haggard. Recheck the great toe on the left foot ).  Annual general skin examination, multiple areas which patient would like to have checked Location:  Duration:  Quality:  Associated Signs/Symptoms: Modifying Factors:  Severity:  Timing: Context:   Objective  Well appearing patient in no apparent distress; mood and affect are within normal limits. Multiple noninflamed brown 4 to 12 mm flattopped textured papules  Left Lower Leg - Anterior Pink slightly inflamed flattopped 6 mm scale, dermoscopy compatible with lichenoid keratosis  Mid Frontal Scalp (6) Gritty pink 4 mm crust  Right 2nd Proximal Nail fold of Toe Subungual hyperkeratosis with distal nail plate thickening, opacification.  Although this is compatible with tinea, I cannot exclude trauma perhaps with secondary tinea.  In any event neither patient nor I is enthusiastic about oral antifungals so no intervention will be initiated.  He may use any over-the-counter antifungal he chooses.    A full examination was performed including scalp, head, eyes, ears, nose, lips, neck, chest, axillae, abdomen, back, buttocks, bilateral upper extremities, bilateral lower extremities, hands, feet, fingers, toes, fingernails, and toenails. All findings within normal limits unless otherwise noted below.   Assessment & Plan    Lichenoid keratosis Left Lower Leg - Anterior  recheck if there is growth or bleeding  AK (actinic keratosis) (6) Mid Frontal Scalp  No Ln2 - deferred because patient is going kayaking in a few days   Related Medications Fluorouracil (TOLAK) 4 % CREA Total of 28 applications to the area  Seborrheic keratosis  Leave if stable  Toenail deformity Right 2nd Proximal Nail fold of Toe  No prescription intervention.      I, [name], MD, have reviewed all documentation for this visit.  The documentation on 08/11/21 for the exam, diagnosis, procedures, and orders are all accurate and complete.

## 2021-08-13 NOTE — Progress Notes (Signed)
   Chief Complaint  Patient presents with   hammertoe    Patient states that he has been having lots of discomfort with right foot, cramping in arches and ball of the right foot.    HPI: 76 year old male presenting today as a reestablish new patient for evaluation of hammertoes to the right foot.  He was last seen in the office 2019 for the same condition.  Patient remains active and healthy but he has had some intermittent cramping in the arches of his feet.  He takes meloxicam for his hips and knees and back pain.  Currently he is wearing good supportive tennis shoes.  Past Medical History:  Diagnosis Date   ADD (attention deficit disorder) without hyperactivity    Allergy    Anemia    no treatment   Anxiety    Arthralgia    many joints, managed with vitamins/herbs and ibuprofen   Arthritis    Cataract    Chest pain    Colitis 1986   single attack (believes was ulcerative)   Depression    Eczema    Esophageal stricture    GERD (gastroesophageal reflux disease)    Hemorrhoids    Lichen planus    Neuromuscular disorder ([place])    POLIO AGE 63   Oral cancer ([place]) 05/2010   plans for surgery 06/2010   Osteoporosis    hips   Pleurisy 1984   Polio age 57   Umbilical hernia    Past Surgical History:  Procedure Laterality Date   COLONOSCOPY  2012   LEG SURGERY  when in 6th grade   L leg, bone spur removal (just above knee, laterally)   TONGUE SURGERY     for tongue cancer   UPPER GASTROINTESTINAL ENDOSCOPY     No Known Allergies    Objective: Physical Exam General: The patient is alert and oriented x3 in no acute distress.  Dermatology: Skin is cool, dry and supple bilateral lower extremities. Negative for open lesions or macerations.  Vascular: Palpable pedal pulses bilaterally. No edema or erythema noted. Capillary refill within normal limits.  Neurological: Epicritic and protective threshold grossly intact bilaterally.   Musculoskeletal Exam: All pedal and ankle  joints range of motion within normal limits bilateral. Muscle strength 5/5 in all groups bilateral. Hammertoe contracture deformity noted to digits 2-5 of the right foot  Radiographic Exam RT foot 08/13/2021: Hammertoe contracture deformity noted to the interphalangeal joints and MPJ of the respective hammertoe digits mentioned on clinical musculoskeletal exam.     Assessment: 1. Hammertoes 2-5 right    Plan of Care:  1. Patient evaluated.  2.  Continue good supportive shoes and sneakers 3.  The hammertoes are minimally symptomatic.  Continue to pursue conservative treatment 4.  Return to clinic as needed    Edrick Kins, DPM [facility]  Dr. Edrick Kins, DPM    [address], [place] [address]                Office [phone]  Fax [phone]

## 2021-08-18 DIAGNOSIS — F422 Mixed obsessional thoughts and acts: Secondary | ICD-10-CM | POA: Diagnosis not present

## 2021-09-01 DIAGNOSIS — H40013 Open angle with borderline findings, low risk, bilateral: Secondary | ICD-10-CM | POA: Diagnosis not present

## 2021-09-08 DIAGNOSIS — F422 Mixed obsessional thoughts and acts: Secondary | ICD-10-CM | POA: Diagnosis not present

## 2021-10-01 ENCOUNTER — Telehealth: Payer: Self-pay | Admitting: Family Medicine

## 2021-10-01 NOTE — Telephone Encounter (Signed)
Left message for patient to call back and schedule Medicare Annual Wellness Visit (AWV).   Please offer to do virtually or by telephone.  Left office number and my jabber [phone].  Last AWV:07/24/2019  Please schedule at anytime with Nurse Health Advisor.

## 2021-10-07 DIAGNOSIS — K149 Disease of tongue, unspecified: Secondary | ICD-10-CM | POA: Diagnosis not present

## 2021-10-12 ENCOUNTER — Encounter: Payer: Self-pay | Admitting: Family Medicine

## 2021-10-12 DIAGNOSIS — F988 Other specified behavioral and emotional disorders with onset usually occurring in childhood and adolescence: Secondary | ICD-10-CM

## 2021-10-12 MED ORDER — AMPHETAMINE-DEXTROAMPHETAMINE 20 MG PO TABS: 20.0000 mg | ORAL_TABLET | Freq: Two times a day (BID) | ORAL | 0 refills | Status: DC

## 2021-10-20 DIAGNOSIS — F422 Mixed obsessional thoughts and acts: Secondary | ICD-10-CM | POA: Diagnosis not present

## 2021-10-20 MED ORDER — AMPHETAMINE-DEXTROAMPHETAMINE 20 MG PO TABS
20.0000 mg | ORAL_TABLET | Freq: Two times a day (BID) | ORAL | 0 refills | Status: DC
Start: 2021-10-20 — End: 2022-03-10

## 2021-10-20 NOTE — Addendum Note (Signed)
Addended by: Lamar Blinks C on: 10/20/2021 05:08 PM   Modules accepted: Orders

## 2021-10-26 DIAGNOSIS — Z23 Encounter for immunization: Secondary | ICD-10-CM | POA: Diagnosis not present

## 2021-10-27 ENCOUNTER — Encounter: Payer: Self-pay | Admitting: Family Medicine

## 2021-11-01 ENCOUNTER — Encounter: Payer: Self-pay | Admitting: Family Medicine

## 2021-11-01 DIAGNOSIS — U071 COVID-19: Secondary | ICD-10-CM

## 2021-11-02 MED ORDER — NIRMATRELVIR/RITONAVIR (PAXLOVID)TABLET: 3.0000 | ORAL_TABLET | Freq: Two times a day (BID) | ORAL | 0 refills | Status: AC

## 2021-11-02 NOTE — Addendum Note (Signed)
Addended by: Lamar Blinks C on: 11/02/2021 01:02 PM   Modules accepted: Orders

## 2021-11-08 DIAGNOSIS — R051 Acute cough: Secondary | ICD-10-CM | POA: Diagnosis not present

## 2021-11-17 DIAGNOSIS — F422 Mixed obsessional thoughts and acts: Secondary | ICD-10-CM | POA: Diagnosis not present

## 2021-11-26 ENCOUNTER — Encounter: Payer: Self-pay | Admitting: Family Medicine

## 2021-11-26 DIAGNOSIS — E611 Iron deficiency: Secondary | ICD-10-CM

## 2021-11-27 MED ORDER — FERROUS SULFATE 324 (65 FE) MG PO TBEC: DELAYED_RELEASE_TABLET | ORAL | 3 refills | Status: DC

## 2021-12-31 NOTE — Telephone Encounter (Signed)
Pt has an awv with Amber on Tuesday. Just wanted to confirm there was an order for the PCV20 shot.

## 2022-01-04 ENCOUNTER — Ambulatory Visit (INDEPENDENT_AMBULATORY_CARE_PROVIDER_SITE_OTHER): Payer: Medicare Other | Admitting: *Deleted

## 2022-01-04 VITALS — BP 146/80 | HR 65 | Ht 74.0 in | Wt 198.4 lb

## 2022-01-04 DIAGNOSIS — Z23 Encounter for immunization: Secondary | ICD-10-CM | POA: Diagnosis not present

## 2022-01-04 DIAGNOSIS — Z0000 Encounter for general adult medical examination without abnormal findings: Secondary | ICD-10-CM

## 2022-01-04 NOTE — Patient Instructions (Signed)
Jonathan Garrison , Thank you for taking time to come for your Medicare Wellness Visit. I appreciate your ongoing commitment to your health goals. Please review the following plan we discussed and let me know if I can assist you in the future.   These are the goals we discussed:  Goals      Maintain healthy active lifestyle.        This is a list of the screening recommended for you and due dates:  Health Maintenance  Topic Date Due   COVID-19 Vaccine (7 - 2023-24 season) 12/21/2021   Zoster (Shingles) Vaccine (2 of 2) 02/11/2022   Medicare Annual Wellness Visit  01/05/2023   DTaP/Tdap/Td vaccine (3 - Td or Tdap) 01/13/2027   Colon Cancer Screening  05/15/2030   Pneumonia Vaccine  Completed   Flu Shot  Completed   Hepatitis C Screening: USPSTF Recommendation to screen - Ages 18-79 yo.  Completed   HPV Vaccine  Aged Out     Next appointment: Follow up in one year for your annual wellness visit.   Preventive Care 50 Years and Older, Male Preventive care refers to lifestyle choices and visits with your health care provider that can promote health and wellness. What does preventive care include? A yearly physical exam. This is also called an annual well check. Dental exams once or twice a year. Routine eye exams. Ask your health care provider how often you should have your eyes checked. Personal lifestyle choices, including: Daily care of your teeth and gums. Regular physical activity. Eating a healthy diet. Avoiding tobacco and drug use. Limiting alcohol use. Practicing safe sex. Taking low doses of aspirin every day. Taking vitamin and mineral supplements as recommended by your health care provider. What happens during an annual well check? The services and screenings done by your health care provider during your annual well check will depend on your age, overall health, lifestyle risk factors, and family history of disease. Counseling  Your health care provider may ask you  questions about your: Alcohol use. Tobacco use. Drug use. Emotional well-being. Home and relationship well-being. Sexual activity. Eating habits. History of falls. Memory and ability to understand (cognition). Work and work Statistician. Screening  You may have the following tests or measurements: Height, weight, and BMI. Blood pressure. Lipid and cholesterol levels. These may be checked every 5 years, or more frequently if you are over 72 years old. Skin check. Lung cancer screening. You may have this screening every year starting at age 62 if you have a 30-pack-year history of smoking and currently smoke or have quit within the past 15 years. Fecal occult blood test (FOBT) of the stool. You may have this test every year starting at age 76. Flexible sigmoidoscopy or colonoscopy. You may have a sigmoidoscopy every 5 years or a colonoscopy every 10 years starting at age 33. Prostate cancer screening. Recommendations will vary depending on your family history and other risks. Hepatitis C blood test. Hepatitis B blood test. Sexually transmitted disease (STD) testing. Diabetes screening. This is done by checking your blood sugar (glucose) after you have not eaten for a while (fasting). You may have this done every 1-3 years. Abdominal aortic aneurysm (AAA) screening. You may need this if you are a current or former smoker. Osteoporosis. You may be screened starting at age 94 if you are at high risk. Talk with your health care provider about your test results, treatment options, and if necessary, the need for more tests. Vaccines  Your  health care provider may recommend certain vaccines, such as: Influenza vaccine. This is recommended every year. Tetanus, diphtheria, and acellular pertussis (Tdap, Td) vaccine. You may need a Td booster every 10 years. Zoster vaccine. You may need this after age 54. Pneumococcal 13-valent conjugate (PCV13) vaccine. One dose is recommended after age  58. Pneumococcal polysaccharide (PPSV23) vaccine. One dose is recommended after age 66. Talk to your health care provider about which screenings and vaccines you need and how often you need them. This information is not intended to replace advice given to you by your health care provider. Make sure you discuss any questions you have with your health care provider. Document Released: 02/06/2015 Document Revised: 09/30/2015 Document Reviewed: 11/11/2014 Elsevier Interactive Patient Education  2017 [place] Prevention in the Home Falls can cause injuries. They can happen to people of all ages. There are many things you can do to make your home safe and to help prevent falls. What can I do on the outside of my home? Regularly fix the edges of walkways and driveways and fix any cracks. Remove anything that might make you trip as you walk through a door, such as a raised step or threshold. Trim any bushes or trees on the path to your home. Use bright outdoor lighting. Clear any walking paths of anything that might make someone trip, such as rocks or tools. Regularly check to see if handrails are loose or broken. Make sure that both sides of any steps have handrails. Any raised decks and porches should have guardrails on the edges. Have any leaves, snow, or ice cleared regularly. Use sand or salt on walking paths during winter. Clean up any spills in your garage right away. This includes oil or grease spills. What can I do in the bathroom? Use night lights. Install grab bars by the toilet and in the tub and shower. Do not use towel bars as grab bars. Use non-skid mats or decals in the tub or shower. If you need to sit down in the shower, use a plastic, non-slip stool. Keep the floor dry. Clean up any water that spills on the floor as soon as it happens. Remove soap buildup in the tub or shower regularly. Attach bath mats securely with double-sided non-slip rug tape. Do not have throw  rugs and other things on the floor that can make you trip. What can I do in the bedroom? Use night lights. Make sure that you have a light by your bed that is easy to reach. Do not use any sheets or blankets that are too big for your bed. They should not hang down onto the floor. Have a firm chair that has side arms. You can use this for support while you get dressed. Do not have throw rugs and other things on the floor that can make you trip. What can I do in the kitchen? Clean up any spills right away. Avoid walking on wet floors. Keep items that you use a lot in easy-to-reach places. If you need to reach something above you, use a strong step stool that has a grab bar. Keep electrical cords out of the way. Do not use floor polish or wax that makes floors slippery. If you must use wax, use non-skid floor wax. Do not have throw rugs and other things on the floor that can make you trip. What can I do with my stairs? Do not leave any items on the stairs. Make sure that there are handrails  on both sides of the stairs and use them. Fix handrails that are broken or loose. Make sure that handrails are as long as the stairways. Check any carpeting to make sure that it is firmly attached to the stairs. Fix any carpet that is loose or worn. Avoid having throw rugs at the top or bottom of the stairs. If you do have throw rugs, attach them to the floor with carpet tape. Make sure that you have a light switch at the top of the stairs and the bottom of the stairs. If you do not have them, ask someone to add them for you. What else can I do to help prevent falls? Wear shoes that: Do not have high heels. Have rubber bottoms. Are comfortable and fit you well. Are closed at the toe. Do not wear sandals. If you use a stepladder: Make sure that it is fully opened. Do not climb a closed stepladder. Make sure that both sides of the stepladder are locked into place. Ask someone to hold it for you, if  possible. Clearly mark and make sure that you can see: Any grab bars or handrails. First and last steps. Where the edge of each step is. Use tools that help you move around (mobility aids) if they are needed. These include: Canes. Walkers. Scooters. Crutches. Turn on the lights when you go into a dark area. Replace any light bulbs as soon as they burn out. Set up your furniture so you have a clear path. Avoid moving your furniture around. If any of your floors are uneven, fix them. If there are any pets around you, be aware of where they are. Review your medicines with your doctor. Some medicines can make you feel dizzy. This can increase your chance of falling. Ask your doctor what other things that you can do to help prevent falls. This information is not intended to replace advice given to you by your health care provider. Make sure you discuss any questions you have with your health care provider. Document Released: 11/06/2008 Document Revised: 06/18/2015 Document Reviewed: 02/14/2014 Elsevier Interactive Patient Education  2017 [employer].

## 2022-01-04 NOTE — Progress Notes (Signed)
Subjective:   Jonathan Garrison is a 76 y.o. male who presents for Medicare Annual/Subsequent preventive examination.  Review of Systems    Defer to PCP Cardiac Risk Factors include: advanced age (>90mn, >>50women);male gender;obesity (BMI >30kg/m2)     Objective:    Today's Vitals   01/04/22 0944 01/04/22 1017  BP: (!) 162/80 (!) 146/80  Pulse: 76 65  Weight: 198 lb 6.4 oz (90 kg)   Height: '6\' 2"'$  (1.88 m)    Body mass index is 25.47 kg/m.     01/04/2022    9:54 AM 07/24/2019    9:39 AM 06/06/2014    2:41 PM 08/15/2013    2:55 PM  Advanced Directives  Does Patient Have a Medical Advance Directive? Yes No No Patient does not have advance directive  Type of Advance Directive Living will;Healthcare Power of Attorney     Does patient want to make changes to medical advance directive? No - Patient declined     Copy of HOkayin Chart? No - copy requested     Would patient like information on creating a medical advance directive?  Yes (MAU/Ambulatory/Procedural Areas - Information given) No - patient declined information   Pre-existing out of facility DNR order (yellow form or pink MOST form)    No    Current Medications (verified) Outpatient Encounter Medications as of 01/04/2022  Medication Sig   amphetamine-dextroamphetamine (ADDERALL) 20 MG tablet Take 1 tablet (20 mg total) by mouth 2 (two) times daily.   aspirin 81 MG tablet Take 81 mg by mouth daily.   b complex vitamins tablet Take 0.5 tablets by mouth daily.   Biotin 5000 MCG CAPS Take 1 capsule by mouth daily.   Calcium-Magnesium-Zinc 1195-09-3MG TABS Take by mouth. 1 tablet daily   chlorpheniramine (CHLOR-TRIMETON) 4 MG tablet Take 2 mg by mouth every 6 (six) hours as needed.   Cholecalciferol (VITAMIN D3) 2000 UNITS capsule Take 2,000 Units by mouth daily.   ferrous sulfate 324 (65 Fe) MG TBEC Take one every other day for low iron   Fluorouracil (TOLAK) 4 % CREA Total of 28 applications to the  area   fluticasone (FLONASE) 50 MCG/ACT nasal spray SHAKE LIQUID AND USE 2 SPRAYS IN EACH NOSTRIL DAILY   glucosamine-chondroitin 500-400 MG tablet Take 1 tablet by mouth 2 (two) times daily.   meloxicam (MOBIC) 15 MG tablet TAKE 1 TABLET(15 MG) BY MOUTH DAILY AS NEEDED   Multiple Vitamins-Minerals (MULTIVITAMIN WITH MINERALS) tablet Take 1 tablet by mouth daily.   NON FORMULARY ALJ -herbal decongestant  Takes as needed   omeprazole (PRILOSEC) 40 MG capsule Take 1 capsule (40 mg total) by mouth in the morning and at bedtime.   sildenafil (REVATIO) 20 MG tablet Take 2-3 tablets one hour prior to intercourse   tacrolimus (PROTOPIC) 0.1 % ointment Apply topically daily.   Zn-Pyg Afri-Nettle-Saw Palmet (SAW PALMETTO COMPLEX PO) Take 1 tablet by mouth 2 (two) times daily.   [DISCONTINUED] amoxicillin-clavulanate (AUGMENTIN) 875-125 MG tablet Take 1 tablet by mouth 2 (two) times daily.   No facility-administered encounter medications on file as of 01/04/2022.    Allergies (verified) Patient has no known allergies.   History: Past Medical History:  Diagnosis Date   ADD (attention deficit disorder) without hyperactivity    Allergy    Anemia    no treatment   Anxiety    Arthralgia    many joints, managed with vitamins/herbs and ibuprofen   Arthritis  Cataract    Chest pain    Colitis 1986   single attack (believes was ulcerative)   Depression    Eczema    Esophageal stricture    GERD (gastroesophageal reflux disease)    Hemorrhoids    Lichen planus    Neuromuscular disorder ([place])    POLIO AGE 89   Oral cancer ([place]) 05/2010   plans for surgery 06/2010   Osteoporosis    hips   Pleurisy 1984   Polio age 17   Umbilical hernia    Past Surgical History:  Procedure Laterality Date   COLONOSCOPY  2012   LEG SURGERY  when in 6th grade   L leg, bone spur removal (just above knee, laterally)   TONGUE SURGERY     for tongue cancer   UPPER GASTROINTESTINAL ENDOSCOPY     Family  History  Problem Relation Age of Onset   Hypertension Mother    Diabetes Mother    Hyperlipidemia Mother    Hypertension Father    Benign prostatic hyperplasia Father    Heart disease Father    Lichen planus Sister    Cancer Sister        ?spot on chest? 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)    Employer: [employer]  Tobacco Use   Smoking status: Former    Types: Pipe, Cigarettes    Quit date: 01/25/1976    Years since quitting: 45.9   Smokeless tobacco: Never   Tobacco comments:    Quit smoking 35 years ago  Vaping Use   Vaping Use: Never used  Substance and Sexual Activity   Alcohol use: Yes    Alcohol/week: 7.0 - 14.0 standard drinks of alcohol    Types: 7 - 14 Glasses of wine per week   Drug use: No   Sexual activity: Not on file  Other Topics Concern   Not on file  Social History Narrative   Not on file   Social Determinants of Health   Financial Resource Strain: Low Risk  (01/04/2022)   Overall Financial Resource Strain (CARDIA)    Difficulty of Paying Living Expenses: Not hard at all  Food Insecurity: No Food Insecurity (01/04/2022)   Hunger Vital Sign    Worried About Running Out of Food in the Last Year: Never true    Ran Out of Food in the Last Year: Never true  Transportation Needs: No Transportation Needs (01/04/2022)   PRAPARE - Hydrologist (Medical): No    Lack of Transportation (Non-Medical): No  Physical Activity: Sufficiently Active (01/04/2022)    Exercise Vital Sign    Days of Exercise per Week: 6 days    Minutes of Exercise per Session: 60 min  Stress: No Stress Concern Present (01/04/2022)   [place]    Feeling of Stress :  Only a little  Social Connections: Moderately Isolated (01/04/2022)   Social Connection and Isolation Panel [NHANES]    Frequency of Communication with Friends and Family: Never    Frequency of Social Gatherings with Friends and Family: More than three times a week    Attends Religious Services: Never    Marine scientist or Organizations: Yes    Attends Music therapist: More than 4 times per year    Marital Status: Divorced    Tobacco Counseling Counseling given: Not Answered Tobacco comments: Quit smoking 35 years ago   Clinical Intake:  Pre-visit preparation completed: Yes  Pain : No/denies pain     Diabetes: No  How often do you need to have someone help you when you read instructions, pamphlets, or other written materials from your doctor or pharmacy?: 1 - Never   Activities of Daily Living    01/04/2022   10:06 AM 01/22/2021    2:45 PM  In your present state of health, do you have any difficulty performing the following activities:  Hearing? 1 0  Vision? 1 0  Comment has some floaters   Difficulty concentrating or making decisions? 0 0  Walking or climbing stairs? 1 0  Dressing or bathing? 0 0  Doing errands, shopping? 0 0  Preparing Food and eating ? N   Using the Toilet? N   In the past six months, have you accidently leaked urine? N   Do you have problems with loss of bowel control? N   Managing your Medications? N   Managing your Finances? N   Housekeeping or managing your Housekeeping? N     Patient Care Team: Copland, Gay Filler, MD as PCP - General (Family Medicine) Irene Shipper, MD (Gastroenterology) Phylliss Bob, MD (Orthopedic Surgery)  Indicate any recent Medical Services  you may have received from other than [facility] providers in the past year (date may be approximate).     Assessment:   This is a routine wellness examination for [place].  Hearing/Vision screen No results found.  Dietary issues and exercise activities discussed: Current Exercise Habits: Home exercise routine, Type of exercise: walking, Time (Minutes): 60, Frequency (Times/Week): 6, Weekly Exercise (Minutes/Week): 360, Intensity: Moderate, Exercise limited by: None identified   Goals Addressed   None    Depression Screen    01/04/2022   10:05 AM 06/11/2020    1:25 PM 07/24/2019    9:52 AM 01/12/2017    1:15 PM 01/07/2016    4:58 PM 12/09/2015    3:29 PM 06/18/2015    9:26 AM  PHQ 2/9 Scores  PHQ - 2 Score 0 '1 1 1 '$ 0 0 0    Fall Risk    01/04/2022   10:06 AM 06/11/2020    1:24 PM 07/24/2019    9:52 AM 08/24/2018    6:13 PM 01/12/2017    1:15 PM  Fall Risk   Falls in the past year? 0 1 0  No  Comment    Emmi Telephone Survey: data to providers prior to load   Number falls in past yr: 0 1 0    Comment    Emmi Telephone Survey Actual Response =    Injury with Fall? 0 1 0    Risk for fall due to : No Fall Risks      Follow up Falls evaluation completed  Education provided;Falls prevention discussed      FALL RISK PREVENTION PERTAINING TO THE HOME:  Any stairs in  or around the home? Yes  If so, are there any without handrails? No  Home free of loose throw rugs in walkways, pet beds, electrical cords, etc? Yes  Adequate lighting in your home to reduce risk of falls? Yes   ASSISTIVE DEVICES UTILIZED TO PREVENT FALLS:  Life alert? No  Use of a cane, walker or w/c? No  Grab bars in the bathroom? No  Shower chair or bench in shower? Yes  Elevated toilet seat or a handicapped toilet? No   TIMED UP AND GO:  Was the test performed? Yes .  Length of time to ambulate 10 feet: 4 sec.   Gait steady and fast without use of assistive device  Cognitive Function:    01/04/2022    10:11 AM  MMSE - Mini Mental State Exam  Not completed: Refused        Immunizations Immunization History  Administered Date(s) Administered   Fluad Quad(high Dose 65+) 10/30/2018   Influenza Split 10/26/2021   Influenza, High Dose Seasonal PF 12/09/2015, 10/17/2016, 12/29/2017   Influenza,inj,Quad PF,6+ Mos 12/19/2014   Influenza-Unspecified 11/07/2013, 11/25/2019, 12/16/2020   PFIZER(Purple Top)SARS-COV-2 Vaccination 03/09/2019, 04/02/2019, 11/17/2019   PNEUMOCOCCAL CONJUGATE-20 01/04/2022   Pfizer Covid-19 Vaccine Bivalent Booster 57yr & up 12/16/2020, 07/16/2021, 10/26/2021   Pneumococcal Conjugate-13 04/15/2014   Pneumococcal Polysaccharide-23 12/09/2015   Respiratory Syncytial Virus Vaccine,Recomb Aduvanted(Arexvy) 12/09/2021   Td 01/12/2017   Tdap 01/09/2007   Zoster Recombinat (Shingrix) 12/17/2021    TDAP status: Up to date  Flu Vaccine status: Up to date  Pneumococcal vaccine status: Completed during today's visit.  Covid-19 vaccine status: Information provided on how to obtain vaccines.   Qualifies for Shingles Vaccine? Yes   Zostavax completed No   Shingrix Completed?: No.    Education has been provided regarding the importance of this vaccine. Patient has been advised to call insurance company to determine out of pocket expense if they have not yet received this vaccine. Advised may also receive vaccine at local pharmacy or Health Dept. Verbalized acceptance and understanding.  Screening Tests Health Maintenance  Topic Date Due   Medicare Annual Wellness (AWV)  Never done   COVID-19 Vaccine (7 - 2023-24 season) 12/21/2021   Zoster Vaccines- Shingrix (2 of 2) 02/11/2022   DTaP/Tdap/Td (3 - Td or Tdap) 01/13/2027   COLONOSCOPY (Pts 45-466yrInsurance coverage will need to be confirmed)  05/15/2030   Pneumonia Vaccine 6561Years old  Completed   INFLUENZA VACCINE  Completed   Hepatitis C Screening  Completed   HPV VACCINES  Aged Out    Health  Maintenance  Health Maintenance Due  Topic Date Due   Medicare Annual Wellness (AWV)  Never done   COVID-19 Vaccine (7 - 2023-24 season) 12/21/2021    Colorectal cancer screening: Type of screening: Colonoscopy. Completed 05/14/20. Repeat every 10 years  Lung Cancer Screening: (Low Dose CT Chest recommended if Age 75-80ears, 30 pack-year currently smoking OR have quit w/in 15years.) does not qualify.   Additional Screening:  Hepatitis C Screening: does qualify; Completed 12/19/14  Vision Screening: Recommended annual ophthalmology exams for early detection of glaucoma and other disorders of the eye. Is the patient up to date with their annual eye exam?  Yes  Who is the provider or what is the name of the office in which the patient attends annual eye exams? Dr. Heather [place] pt is not established with a provider, would they like to be referred to a provider to establish care? No .  Dental Screening: Recommended annual dental exams for proper oral hygiene  Community Resource Referral / Chronic Care Management: CRR required this visit?  No   CCM required this visit?  No      Plan:     I have personally reviewed and noted the following in the patient's chart:   Medical and social history Use of alcohol, tobacco or illicit drugs  Current medications and supplements including opioid prescriptions. Patient is not currently taking opioid prescriptions. Functional ability and status Nutritional status Physical activity Advanced directives List of other physicians Hospitalizations, surgeries, and ER visits in previous 12 months Vitals Screenings to include cognitive, depression, and falls Referrals and appointments  In addition, I have reviewed and discussed with patient certain preventive protocols, quality metrics, and best practice recommendations. A written personalized care plan for preventive services as well as general preventive health recommendations were provided  to patient.     Beatris Ship, [place]   01/04/2022   Nurse Notes: None

## 2022-01-05 DIAGNOSIS — F422 Mixed obsessional thoughts and acts: Secondary | ICD-10-CM | POA: Diagnosis not present

## 2022-01-06 ENCOUNTER — Encounter (INDEPENDENT_AMBULATORY_CARE_PROVIDER_SITE_OTHER): Payer: Medicare Other | Admitting: Family Medicine

## 2022-01-06 DIAGNOSIS — G8929 Other chronic pain: Secondary | ICD-10-CM | POA: Diagnosis not present

## 2022-01-06 DIAGNOSIS — M25569 Pain in unspecified knee: Secondary | ICD-10-CM | POA: Diagnosis not present

## 2022-01-06 DIAGNOSIS — E663 Overweight: Secondary | ICD-10-CM

## 2022-01-06 DIAGNOSIS — I10 Essential (primary) hypertension: Secondary | ICD-10-CM | POA: Diagnosis not present

## 2022-01-07 NOTE — Telephone Encounter (Signed)
Please see the MyChart message reply(ies) for my assessment and plan.  The patient gave consent for this Medical Advice Message and is aware that it may result in a bill to their insurance company as well as the possibility that this may result in a co-payment or deductible. They are an established patient, but are not seeking medical advice exclusively about a problem treated during an in person or video visit in the last 7 days. I did not recommend an in person or video visit within 7 days of my reply.  I spent a total of 10 minutes cumulative time within 7 days through MyChart messaging Anella Nakata, MD  

## 2022-01-07 NOTE — Addendum Note (Signed)
Addended by: Lamar Blinks C on: 01/07/2022 01:19 PM   Modules accepted: Orders

## 2022-01-07 NOTE — Addendum Note (Signed)
Addended by: Lamar Blinks C on: 01/07/2022 06:18 PM   Modules accepted: Orders

## 2022-01-10 MED ORDER — LOSARTAN POTASSIUM 25 MG PO TABS: 25.0000 mg | ORAL_TABLET | Freq: Every day | ORAL | 1 refills | Status: DC

## 2022-01-10 NOTE — Addendum Note (Signed)
Addended by: Lamar Blinks C on: 01/10/2022 07:45 PM   Modules accepted: Orders

## 2022-01-11 ENCOUNTER — Encounter (HOSPITAL_BASED_OUTPATIENT_CLINIC_OR_DEPARTMENT_OTHER): Payer: Self-pay | Admitting: Orthopaedic Surgery

## 2022-01-12 ENCOUNTER — Ambulatory Visit (INDEPENDENT_AMBULATORY_CARE_PROVIDER_SITE_OTHER): Payer: Medicare Other | Admitting: Orthopaedic Surgery

## 2022-01-12 DIAGNOSIS — M533 Sacrococcygeal disorders, not elsewhere classified: Secondary | ICD-10-CM

## 2022-01-12 DIAGNOSIS — G8929 Other chronic pain: Secondary | ICD-10-CM | POA: Diagnosis not present

## 2022-01-12 NOTE — Progress Notes (Signed)
Chief Complaint: Right knee pain, left knee tightness     History of Present Illness:    Jonathan Garrison is a 76 y.o. male presents today with ongoing right knee pain as well as left knee tightness particularly in the morning.  Of note he is very active and previously was an avid basketball player.  He did have to quit last summer as a result of the right knee.  He does have pain about the lateral aspect of the knee at rest.  He was seen by an outside orthopedist who recommended injection for tendinitis.  He does enjoy white water kayaking.  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  He wears a custom insole for this.  Finally he does endorse left-sided SI type back pain for which she has been working on a good stretching program of his back.  He previously worked as a professor at [employer]    Surgical History:   None  PMH/PSH/Family History/Social History/Meds/Allergies:    Past Medical History:  Diagnosis Date   ADD (attention deficit disorder) without hyperactivity    Allergy    Anemia    no treatment   Anxiety    Arthralgia    many joints, managed with vitamins/herbs and ibuprofen   Arthritis    Cataract    Chest pain    Colitis 1986   single attack (believes was ulcerative)   Depression    Eczema    Esophageal stricture    GERD (gastroesophageal reflux disease)    Hemorrhoids    Lichen planus    Neuromuscular disorder ([place])    POLIO AGE 49   Oral cancer ([place]) 05/2010   plans for surgery 06/2010   Osteoporosis    hips   Pleurisy 1984   Polio age 37   Umbilical hernia    Past Surgical History:  Procedure Laterality Date   COLONOSCOPY  2012   LEG SURGERY  when in 6th grade   L leg, bone spur removal (just above knee, laterally)   TONGUE SURGERY     for tongue cancer   UPPER GASTROINTESTINAL ENDOSCOPY     Social History   Socioeconomic History   Marital status: Single    Spouse name: Not on file   Number of  children: Not on file   Years of education: Not on file   Highest education level: Not on file  Occupational History   Occupation: Psychologist, 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)    Employer: [employer]  Tobacco Use   Smoking status: Former    Types: Pipe, Cigarettes    Quit date: 01/25/1976    Years since quitting: 45.9   Smokeless tobacco: Never   Tobacco comments:    Quit smoking 35 years ago  Vaping Use   Vaping Use: Never used  Substance and Sexual Activity   Alcohol use: Yes    Alcohol/week: 7.0 - 14.0 standard drinks of alcohol    Types: 7 - 14 Glasses of wine per week   Drug use: No   Sexual activity: Not on file  Other Topics Concern   Not on file  Social History Narrative   Not on file   Social Determinants of Health   Financial Resource Strain: Low Risk  (01/04/2022)   Overall Financial Resource Strain (CARDIA)  Difficulty of Paying Living Expenses: Not hard at all  Food Insecurity: No Food Insecurity (01/04/2022)   Hunger Vital Sign    Worried About Running Out of Food in the Last Year: Never true    Ran Out of Food in the Last Year: Never true  Transportation Needs: No Transportation Needs (01/04/2022)   PRAPARE - Hydrologist (Medical): No    Lack of Transportation (Non-Medical): No  Physical Activity: Sufficiently Active (01/04/2022)   Exercise Vital Sign    Days of Exercise per Week: 6 days    Minutes of Exercise per Session: 60 min  Stress: No Stress Concern Present (01/04/2022)   [place]    Feeling of Stress : Only a little  Social Connections: Moderately Isolated (01/04/2022)   Social Connection and Isolation Panel [NHANES]    Frequency of Communication with Friends and Family: Never    Frequency of Social Gatherings with Friends and Family: More than three times a week    Attends Religious Services: Never    Marine scientist or Organizations: Yes     Attends Music therapist: More than 4 times per year    Marital Status: Divorced   Family History  Problem Relation Age of Onset   Hypertension Mother    Diabetes Mother    Hyperlipidemia Mother    Hypertension Father    Benign prostatic hyperplasia Father    Heart disease Father    Lichen planus Sister    Cancer Sister        ?spot on chest? contained   Diabetes Maternal Grandfather    Colon cancer Neg Hx    Esophageal cancer Neg Hx    Stomach cancer Neg Hx    Pancreatic cancer Neg Hx    Liver disease Neg Hx    Colon polyps Neg Hx    Rectal cancer Neg Hx    No Known Allergies Current Outpatient Medications  Medication Sig Dispense Refill   amphetamine-dextroamphetamine (ADDERALL) 20 MG tablet Take 1 tablet (20 mg total) by mouth 2 (two) times daily. 60 tablet 0   aspirin 81 MG tablet Take 81 mg by mouth daily.     b complex vitamins tablet Take 0.5 tablets by mouth daily.     Biotin 5000 MCG CAPS Take 1 capsule by mouth daily.     Calcium-Magnesium-Zinc 240-97-3 MG TABS Take by mouth. 1 tablet daily     chlorpheniramine (CHLOR-TRIMETON) 4 MG tablet Take 2 mg by mouth every 6 (six) hours as needed.     Cholecalciferol (VITAMIN D3) 2000 UNITS capsule Take 2,000 Units by mouth daily.     ferrous sulfate 324 (65 Fe) MG TBEC Take one every other day for low iron 45 tablet 3   Fluorouracil (TOLAK) 4 % CREA Total of 28 applications to the area 40 g 0   fluticasone (FLONASE) 50 MCG/ACT nasal spray SHAKE LIQUID AND USE 2 SPRAYS IN EACH NOSTRIL DAILY 16 g 11   glucosamine-chondroitin 500-400 MG tablet Take 1 tablet by mouth 2 (two) times daily.     losartan (COZAAR) 25 MG tablet Take 1 tablet (25 mg total) by mouth daily. 90 tablet 1   meloxicam (MOBIC) 15 MG tablet TAKE 1 TABLET(15 MG) BY MOUTH DAILY AS NEEDED 90 tablet 3   Multiple Vitamins-Minerals (MULTIVITAMIN WITH MINERALS) tablet Take 1 tablet by mouth daily.     NON FORMULARY ALJ -herbal decongestant  Takes  as  needed     omeprazole (PRILOSEC) 40 MG capsule Take 1 capsule (40 mg total) by mouth in the morning and at bedtime. 1803 capsule 3   sildenafil (REVATIO) 20 MG tablet Take 2-3 tablets one hour prior to intercourse 60 tablet 5   tacrolimus (PROTOPIC) 0.1 % ointment Apply topically daily. 100 g 2   Zn-Pyg Afri-Nettle-Saw Palmet (SAW PALMETTO COMPLEX PO) Take 1 tablet by mouth 2 (two) times daily.     No current facility-administered medications for this visit.   No results found.  Review of Systems:   A ROS was performed including pertinent positives and negatives as documented in the HPI.  Physical Exam :   Constitutional: NAD and appears stated age Neurological: Alert and oriented Psych: Appropriate affect and cooperative There were no vitals taken for this visit.   Comprehensive Musculoskeletal Exam:    He is tenderness palpation about the left SI joint.  There is tenderness about the right knee IT band laterally.  No joint line tenderness about the right knee.  Range of motion is from 0 to 130 degrees without crepitus.  He has bilateral cavovarus feet with hammertoes throughout.  Imaging:   Xray (right knee 4 views, left knee 4 views): Right knee with very mild degenerative findings particularly about the medial joint space    I personally reviewed and interpreted the radiographs.   Assessment:   76 y.o. male with right knee pain consistent with lateral IT band tendinitis.  This is very consistent with his history of needing to stretch out the IT band proximally.  I did describe that I do believe a some IT band extra-articular injection will give him significant relief.  I would like him to also start physical therapy for good IT stretching program.  With regard to the left SI joint I will plan to make referral to Dr. Laurence Spates for left SI guided injection.  I stated that he may also contact me should he want to consider custom orthotics for his cavovarus feet.  Plan :    -He  will return to clinic if no significant relief from his sub-IT band injection     I personally saw and evaluated the patient, and participated in the management and treatment plan.  Vanetta Mulders, MD Attending Physician, Orthopedic Surgery  This document was dictated using Dragon voice recognition software. A reasonable attempt at proof reading has been made to minimize errors.

## 2022-02-02 DIAGNOSIS — F422 Mixed obsessional thoughts and acts: Secondary | ICD-10-CM | POA: Diagnosis not present

## 2022-02-07 ENCOUNTER — Encounter: Payer: Self-pay | Admitting: Physical Therapy

## 2022-02-07 ENCOUNTER — Ambulatory Visit (INDEPENDENT_AMBULATORY_CARE_PROVIDER_SITE_OTHER): Payer: Medicare Other | Admitting: Physical Therapy

## 2022-02-07 ENCOUNTER — Other Ambulatory Visit: Payer: Self-pay

## 2022-02-07 DIAGNOSIS — M25561 Pain in right knee: Secondary | ICD-10-CM

## 2022-02-07 DIAGNOSIS — M5459 Other low back pain: Secondary | ICD-10-CM

## 2022-02-07 DIAGNOSIS — M25552 Pain in left hip: Secondary | ICD-10-CM | POA: Diagnosis not present

## 2022-02-07 DIAGNOSIS — R262 Difficulty in walking, not elsewhere classified: Secondary | ICD-10-CM

## 2022-02-07 DIAGNOSIS — M6281 Muscle weakness (generalized): Secondary | ICD-10-CM | POA: Diagnosis not present

## 2022-02-07 DIAGNOSIS — G8929 Other chronic pain: Secondary | ICD-10-CM

## 2022-02-07 NOTE — Therapy (Addendum)
OUTPATIENT PHYSICAL THERAPY THORACOLUMBAR EVALUATION   Patient Name: Gevin Perea MRN: [id number] DOB:[birth year], 77 y.o., male Today's Date: 02/07/2022  END OF SESSION:  PT End of Session - 02/07/22 1433     Visit Number 1    Number of Visits 6    Date for PT Re-Evaluation 05/02/22    Authorization Type MCR    Progress Note Due on Visit 10    PT Start Time 1346    PT Stop Time 1430    PT Time Calculation (min) 44 min    Activity Tolerance Patient tolerated treatment well    Behavior During Therapy WFL for tasks assessed/performed             Past Medical History:  Diagnosis Date   ADD (attention deficit disorder) without hyperactivity    Allergy    Anemia    no treatment   Anxiety    Arthralgia    many joints, managed with vitamins/herbs and ibuprofen   Arthritis    Cataract    Chest pain    Colitis 1986   single attack (believes was ulcerative)   Depression    Eczema    Esophageal stricture    GERD (gastroesophageal reflux disease)    Hemorrhoids    Lichen planus    Neuromuscular disorder ([place])    POLIO AGE 17   Oral cancer ([place]) 05/2010   plans for surgery 06/2010   Osteoporosis    hips   Pleurisy 1984   Polio age 36   Umbilical hernia    Past Surgical History:  Procedure Laterality Date   COLONOSCOPY  2012   LEG SURGERY  when in 6th grade   L leg, bone spur removal (just above knee, laterally)   TONGUE SURGERY     for tongue cancer   UPPER GASTROINTESTINAL ENDOSCOPY     Patient Active Problem List   Diagnosis Date Noted   Increased prostate specific antigen (PSA) velocity 11/27/2017   Osteopenia 08/01/2017   Vitamin B12 deficiency anemia 06/09/2014   Chest pain 03/13/2013   Depression with anxiety 08/26/2011   Overweight (BMI 25.0-29.9) 08/26/2011   Joint pain 08/26/2011   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   Oral cancer ([place]) 07/09/2010    PCP: Darreld Mclean, MD   REFERRING PROVIDER: Vanetta Mulders, MD  REFERRING  DIAG: M53.3,G89.29 (ICD-10-CM) - Chronic left SI joint pain. Rt knee pain  Rationale for Evaluation and Treatment: Rehabilitation  THERAPY DIAG:  Other low back pain  Pain in left hip  Chronic pain of right knee  Muscle weakness (generalized)  Difficulty in walking, not elsewhere classified  ONSET DATE: chronic pain for years  SUBJECTIVE:  SUBJECTIVE STATEMENT: He relays chronic pain around his left SIJ, he says he has some pain in his Rt knee as well and 6-8 weeks ago this flared up and he could barely walk. He had injection in his Rt knee which has helped. He has referral to see Dr. Ernestina Patches for SIJ injection. He is very active and likes to [employer]. He says his Right leg is a little bit shorter compared to his left leg. 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 Yes: NPRS scale: 4 in Rt knee over last week, back pain depends but is 6-8 in mornings/10 Pain location: Rt knee and left SIJ Pain description: ache Aggravating factors: stairs, worse first thing in the morning Relieving factors: meloxicam, voltaren  PRECAUTIONS: None  WEIGHT BEARING RESTRICTIONS: No  FALLS:  Has patient fallen in last 6 months? No  OCCUPATION: retired  PLOF: Independent  PATIENT GOALS: reduce pain,   NEXT MD VISIT:   OBJECTIVE:   DIAGNOSTIC FINDINGS:  Xray (right knee 4 views, left knee 4 views): Right knee with very mild degenerative findings particularly about the medial joint space  PATIENT SURVEYS:  Eval: FOTO 67% functional   COGNITION: Overall cognitive status: Within functional limits for tasks assessed     SENSATION: WFL  MUSCLE LENGTH: Hamstrings: mildly tight at 80 deg bilat ITB tightness mildly on  Rt  POSTURE:   PALPATION: Tender to palpation over Lt SIJ  LUMBAR ROM:   AROM eval  Flexion WNL  Extension WNL  Right lateral flexion WNL  Left lateral flexion WNL  Right rotation WNL  Left rotation WNL   (Blank rows = not tested)  LOWER EXTREMITY ROM:     Active  Right eval Left eval  Hip flexion [facility]  Hip extension    Hip abduction [facility]  Hip adduction    Hip internal rotation    Hip external rotation    Knee flexion [facility] WFL  Knee extension [facility] WFL  Ankle dorsiflexion    Ankle plantarflexion    Ankle inversion    Ankle eversion     (Blank rows = not tested)  LOWER EXTREMITY MMT:    MMT Right eval Left eval  Hip flexion 4+ 4+  Hip extension    Hip abduction 4+ 4+  Hip adduction    Hip internal rotation    Hip external rotation    Knee flexion 5 5  Knee extension 5- 5-  Ankle dorsiflexion 4 5  Ankle plantarflexion    Ankle inversion    Ankle eversion     (Blank rows = not tested)  LUMBAR SPECIAL TESTS:  Eval: Straight leg raise test: Negative and Slump test: Negative Negative SIJ testing No pain with PAM testing to lumbar spine  FUNCTIONAL TESTS:  Eval: SLS X 10 seconds on left, 3-4 seconds on Rt  GAIT: 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: Comments: independent Hydrographic surveyor, 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 TODAY'S TREATMENT:  Eval HEP creation and review with demonstration and trial set preformed, see below for details    PATIENT EDUCATION: Education details: HEP, PT plan of care Person educated: Patient Education method: Explanation, Demonstration, Verbal cues, and Handouts Education comprehension: verbalized understanding and needs further education   HOME EXERCISE PROGRAM: Access Code: [email or web address] URL: [email or web address] Date: 02/07/2022 Prepared by: Elsie Ra  Exercises - Single Leg Bridge  - 2 x daily - 6 x weekly - 1-2 sets - 10  reps - 5 hold - Seated Hamstring Stretch  - 2 x daily - 6 x weekly - 1 sets - 2 reps - 30 hold -  Standing ITB Stretch  - 2 x daily - 6 x weekly - 2-3 sets - 30 hold - Heel Toe Raises with Counter Support  - 2 x daily - 6 x weekly - 2-3 sets - 10 reps - Single Leg Stance  - 2 x daily - 6 x weekly - 1 sets - 5 reps - 10 sec hold - Side Stepping with Resistance at Ankles  - 2 x daily - 6 x weekly - 2-3 sets - 10 reps - Sit to Stand Without Arm Support  - 2 x daily - 6 x weekly - 1-2 sets - 10 reps  ASSESSMENT:  CLINICAL IMPRESSION: Patient referred to PT for chronic Lt SIJ pain and 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 Special testing negative today but he does have some tenderness to palpation over left SIJ with OA, as well as some mild hip/quad weakness and mild tightness in H.S. and ITBand. Patient will benefit from skilled PT to address below impairments, limitations and improve overall function.  OBJECTIVE IMPAIRMENTS: decreased activity tolerance, difficulty walking, decreased balance, decreased endurance, decreased mobility, decreased ROM, decreased strength, impaired flexibility, impaired LE use, postural dysfunction, and pain.  ACTIVITY LIMITATIONS: bending, lifting, carry, locomotion, cleaning, community activity,  PERSONAL FACTORS: childhood polio, OA are also affecting patient's functional outcome.  REHAB POTENTIAL: Good  CLINICAL DECISION MAKING: Stable/uncomplicated  EVALUATION COMPLEXITY: Low    GOALS: Short term PT Goals Target date: 03/07/2022   Pt will be I and compliant with HEP. Baseline:  Goal status: New Pt will decrease pain by 25% overall Baseline: Goal status: New  Long term PT goals Target date:05/02/2022   Pt will improve  hip/knee strength to at least 5-/5 MMT to improve functional strength Baseline: Goal status: New Pt will improve FOTO to at least 69% functional to show improved function Baseline: Goal status: New Pt will reduce pain to overall less than 2-3/10 with usual activity and kayaking Baseline: Goal status: New Pt will  be able to hold SLS X 10 seconds on avg for his Rt leg to show improved balance.  Baseline: 3-4 seconds on Rt Goal status  PLAN: PT FREQUENCY: 1 times per 2 weeks  PT DURATION: 12 weeks  PLANNED INTERVENTIONS (unless contraindicated): aquatic PT, Canalith repositioning, cryotherapy, Electrical stimulation, Iontophoresis with 4 mg/ml dexamethasome, Moist heat, traction, Ultrasound, gait training, Therapeutic exercise, balance training, neuromuscular re-education, patient/family education, prosthetic training, manual techniques, passive ROM, dry needling, taping, vasopnuematic device, vestibular, spinal manipulations, joint manipulations  PLAN FOR NEXT SESSION: review HEP, quad strength, ITB stretching, Hip/SIJ stabilization work    Debbe Odea, PT,DPT 02/07/2022, 2:34 PM

## 2022-02-09 ENCOUNTER — Encounter: Payer: Self-pay | Admitting: Physical Therapy

## 2022-02-09 ENCOUNTER — Other Ambulatory Visit (HOSPITAL_BASED_OUTPATIENT_CLINIC_OR_DEPARTMENT_OTHER): Payer: Self-pay | Admitting: Orthopaedic Surgery

## 2022-02-09 ENCOUNTER — Encounter (HOSPITAL_BASED_OUTPATIENT_CLINIC_OR_DEPARTMENT_OTHER): Payer: Self-pay | Admitting: Orthopaedic Surgery

## 2022-02-09 DIAGNOSIS — G8929 Other chronic pain: Secondary | ICD-10-CM

## 2022-02-10 ENCOUNTER — Other Ambulatory Visit (HOSPITAL_BASED_OUTPATIENT_CLINIC_OR_DEPARTMENT_OTHER): Payer: Self-pay | Admitting: Orthopaedic Surgery

## 2022-02-10 DIAGNOSIS — M25552 Pain in left hip: Secondary | ICD-10-CM

## 2022-02-18 ENCOUNTER — Encounter: Payer: Self-pay | Admitting: Family Medicine

## 2022-02-18 DIAGNOSIS — I10 Essential (primary) hypertension: Secondary | ICD-10-CM

## 2022-02-21 ENCOUNTER — Ambulatory Visit (INDEPENDENT_AMBULATORY_CARE_PROVIDER_SITE_OTHER): Payer: Medicare Other | Admitting: Physical Therapy

## 2022-02-21 ENCOUNTER — Encounter: Payer: Self-pay | Admitting: Physical Therapy

## 2022-02-21 DIAGNOSIS — G8929 Other chronic pain: Secondary | ICD-10-CM

## 2022-02-21 DIAGNOSIS — M5459 Other low back pain: Secondary | ICD-10-CM | POA: Diagnosis not present

## 2022-02-21 DIAGNOSIS — R262 Difficulty in walking, not elsewhere classified: Secondary | ICD-10-CM

## 2022-02-21 DIAGNOSIS — M25561 Pain in right knee: Secondary | ICD-10-CM

## 2022-02-21 DIAGNOSIS — M6281 Muscle weakness (generalized): Secondary | ICD-10-CM

## 2022-02-21 DIAGNOSIS — M25552 Pain in left hip: Secondary | ICD-10-CM | POA: Diagnosis not present

## 2022-02-21 NOTE — Therapy (Signed)
OUTPATIENT PHYSICAL THERAPY TREATMENT   Patient Name: Jonathan Garrison MRN: [id number] DOB:07-24-1945, 77 y.o., male Today's Date: 02/21/2022  END OF SESSION:  PT End of Session - 02/21/22 1343     Visit Number 2    Number of Visits 6    Date for PT Re-Evaluation 05/02/22    Authorization Type MCR    Progress Note Due on Visit 10    PT Start Time 6384    PT Stop Time 1430    PT Time Calculation (min) 45 min    Activity Tolerance Patient tolerated treatment well    Behavior During Therapy WFL for tasks assessed/performed             Past Medical History:  Diagnosis Date   ADD (attention deficit disorder) without hyperactivity    Allergy    Anemia    no treatment   Anxiety    Arthralgia    many joints, managed with vitamins/herbs and ibuprofen   Arthritis    Cataract    Chest pain    Colitis 1986   single attack (believes was ulcerative)   Depression    Eczema    Esophageal stricture    GERD (gastroesophageal reflux disease)    Hemorrhoids    Lichen planus    Neuromuscular disorder ([place])    POLIO AGE 54   Oral cancer ([place]) 05/2010   plans for surgery 06/2010   Osteoporosis    hips   Pleurisy 1984   Polio age 24   Umbilical hernia    Past Surgical History:  Procedure Laterality Date   COLONOSCOPY  2012   LEG SURGERY  when in 6th grade   L leg, bone spur removal (just above knee, laterally)   TONGUE SURGERY     for tongue cancer   UPPER GASTROINTESTINAL ENDOSCOPY     Patient Active Problem List   Diagnosis Date Noted   Increased prostate specific antigen (PSA) velocity 11/27/2017   Osteopenia 08/01/2017   Vitamin B12 deficiency anemia 06/09/2014   Chest pain 03/13/2013   Depression with anxiety 08/26/2011   Overweight (BMI 25.0-29.9) 08/26/2011   Joint pain 08/26/2011   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   Oral cancer ([place]) 07/09/2010    PCP: Darreld Mclean, MD   REFERRING PROVIDER: Vanetta Mulders, MD  REFERRING DIAG: M53.3,G89.29  (ICD-10-CM) - Chronic left SI joint pain. Rt knee pain  Rationale for Evaluation and Treatment: Rehabilitation  THERAPY DIAG:  Other low back pain  Pain in left hip  Chronic pain of right knee  Muscle weakness (generalized)  Difficulty in walking, not elsewhere classified  ONSET DATE: chronic pain for years  SUBJECTIVE:  SUBJECTIVE STATEMENT: He relays he has been compliant with HEP, it is going well, he did not have any questions about them. He is sore today from a workout at the [facility] yesterday.  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 Yes: NPRS scale: stiff and sore more than pain to report today/10 Pain location: Rt knee and left SIJ Pain description: ache Aggravating factors: stairs, worse first thing in the morning Relieving factors: meloxicam, voltaren  PRECAUTIONS: None  WEIGHT BEARING RESTRICTIONS: No  FALLS:  Has patient fallen in last 6 months? No  OCCUPATION: retired  PLOF: Independent  PATIENT GOALS: reduce pain,   NEXT MD VISIT:   OBJECTIVE:   DIAGNOSTIC FINDINGS:  Xray (right knee 4 views, left knee 4 views): Right knee with very mild degenerative findings particularly about the medial joint space  PATIENT SURVEYS:  Eval: FOTO 67% functional   COGNITION: Overall cognitive status: Within functional limits for tasks assessed     SENSATION: WFL  MUSCLE LENGTH: Hamstrings: mildly tight at 80 deg bilat ITB tightness mildly on Rt  POSTURE:   PALPATION: Tender to palpation over Lt SIJ  LUMBAR ROM:   AROM eval  Flexion WNL  Extension WNL  Right lateral flexion WNL  Left lateral flexion WNL  Right rotation WNL  Left rotation WNL   (Blank rows = not tested)  LOWER EXTREMITY ROM:     Active  Right eval Left eval  Hip  flexion [facility]  Hip extension    Hip abduction [facility]  Hip adduction    Hip internal rotation    Hip external rotation    Knee flexion [facility] WFL  Knee extension [facility] WFL  Ankle dorsiflexion    Ankle plantarflexion    Ankle inversion    Ankle eversion     (Blank rows = not tested)  LOWER EXTREMITY MMT:    MMT Right eval Left eval  Hip flexion 4+ 4+  Hip extension    Hip abduction 4+ 4+  Hip adduction    Hip internal rotation    Hip external rotation    Knee flexion 5 5  Knee extension 5- 5-  Ankle dorsiflexion 4 5  Ankle plantarflexion    Ankle inversion    Ankle eversion     (Blank rows = not tested)  LUMBAR SPECIAL TESTS:  Eval: Straight leg raise test: Negative and Slump test: Negative Negative SIJ testing No pain with PAM testing to lumbar spine  FUNCTIONAL TESTS:  Eval: SLS X 10 seconds on left, 3-4 seconds on Rt  GAIT: Eval: Comments: independent community Ambulator, 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 TODAY'S TREATMENT:  02/21/22 -Supine SLR X 12 reps bilat -Sidelying straight leg hip abd X 12 reps bilat -Prone straight leg hip extension X 12 reps bilat -Supine piriformis stretch 30 sec X 2 bilat -Supine butterfly stretch for hip adductors 30 sec X 2 -Quadriped bird dogs X 12 bilat -Sit to stands, no UE support with slow eccentric lower X 12 reps Neuro Re-ed -SLS 15 sec X 3 bilat -Tandem walk without UE support 6 round trips in bars -sidestepping on foam beam without UE support in bars 6 round trips  Manual therapy: Bilat hip PROM, long axis distraction, manual stretching for hamstrings and ITBand    PATIENT EDUCATION: Education details: HEP, PT plan of care Person educated: Patient Education method: Explanation, Demonstration, Verbal cues, and Handouts Education comprehension: verbalized understanding and needs further education   HOME  EXERCISE PROGRAM: Access Code: [email or web address] URL: [email or web address] Date: 02/21/2022 Prepared by:  Elsie Ra, progressed on 02/21/22  Exercises - Single Leg Bridge  - 2 x daily - 6 x weekly - 1-2 sets - 10 reps - 5 hold - Seated Hamstring Stretch  - 2 x daily - 6 x weekly - 1 sets - 2 reps - 30 hold - Standing ITB Stretch  - 2 x daily - 6 x weekly - 2-3 sets - 30 hold - Heel Toe Raises with Counter Support  - 2 x daily - 6 x weekly - 2-3 sets - 10 reps - Single Leg Stance  - 2 x daily - 6 x weekly - 1 sets - 5 reps - 10 sec hold - Side Stepping with Resistance at Ankles  - 2 x daily - 6 x weekly - 2-3 sets - 10 reps - Sit to Stand Without Arm Support  - 2 x daily - 6 x weekly - 1-2 sets - 10 reps - Supine Active Straight Leg Raise  - 2 x daily - 6 x weekly - 1-2 sets - 12 reps - Sidelying Hip Abduction (Both Sides)  - 2 x daily - 6 x weekly - 1-2 sets - 12 reps - Prone Hip Extension  - 2 x daily - 6 x weekly - 1-2 sets - 12 reps - Bird Dog  - 2 x daily - 6 x weekly - 1 sets - 12 reps - 2 sec hold - Supine Piriformis Stretch with Foot on Ground  - 2 x daily - 6 x weekly - 2 sets - 30 hold - Tandem Walking  - 2 x daily - 6 x weekly - 3 sets - 12 reps  ASSESSMENT:  CLINICAL IMPRESSION: We reviewed his HEP and added more hip strengthening, core strength, and balance exercises to this. He had good tolerance to session today without significant complaints. PT recommending to continue with current treatment plan.   OBJECTIVE IMPAIRMENTS: decreased activity tolerance, difficulty walking, decreased balance, decreased endurance, decreased mobility, decreased ROM, decreased strength, impaired flexibility, impaired LE use, postural dysfunction, and pain.  ACTIVITY LIMITATIONS: bending, lifting, carry, locomotion, cleaning, community activity,  PERSONAL FACTORS: childhood polio, OA are also affecting patient's functional outcome.  REHAB POTENTIAL: Good  CLINICAL DECISION MAKING: Stable/uncomplicated  EVALUATION COMPLEXITY: Low    GOALS: Short term PT Goals Target date: 03/07/2022   Pt  will be I and compliant with HEP. Baseline:  Goal status: New Pt will decrease pain by 25% overall Baseline: Goal status: New  Long term PT goals Target date:05/02/2022   Pt will improve  hip/knee strength to at least 5-/5 MMT to improve functional strength Baseline: Goal status: New Pt will improve FOTO to at least 69% functional to show improved function Baseline: Goal status: New Pt will reduce pain to overall less than 2-3/10 with usual activity and kayaking Baseline: Goal status: New Pt will be able to hold SLS X 10 seconds on avg for his Rt leg to show improved balance.  Baseline: 3-4 seconds on Rt Goal status  PLAN: PT FREQUENCY: 1 times per 2 weeks  PT DURATION: 12 weeks  PLANNED INTERVENTIONS (unless contraindicated): aquatic PT, Canalith repositioning, cryotherapy, Electrical stimulation, Iontophoresis with 4 mg/ml dexamethasome, Moist heat, traction, Ultrasound, gait training, Therapeutic exercise, balance training, neuromuscular re-education, patient/family education, prosthetic training, manual techniques, passive ROM, dry needling, taping, vasopnuematic device, vestibular, spinal manipulations, joint manipulations  PLAN FOR NEXT SESSION: review HEP, quad strength, ITB stretching, Hip/SIJ stabilization  work, Pharmacologist.    Debbe Odea, PT,DPT 02/21/2022, 1:44 PM

## 2022-02-21 NOTE — Addendum Note (Signed)
Addended by: Lamar Blinks C on: 02/21/2022 06:04 AM   Modules accepted: Orders

## 2022-02-22 ENCOUNTER — Ambulatory Visit: Payer: Medicare Other | Attending: Cardiovascular Disease | Admitting: Cardiovascular Disease

## 2022-02-22 ENCOUNTER — Ambulatory Visit: Payer: Medicare Other | Admitting: Cardiovascular Disease

## 2022-02-22 ENCOUNTER — Encounter: Payer: Self-pay | Admitting: Cardiovascular Disease

## 2022-02-22 VITALS — BP 152/86 | HR 76 | Ht 74.0 in | Wt 200.6 lb

## 2022-02-22 DIAGNOSIS — Z8249 Family history of ischemic heart disease and other diseases of the circulatory system: Secondary | ICD-10-CM | POA: Insufficient documentation

## 2022-02-22 DIAGNOSIS — R0789 Other chest pain: Secondary | ICD-10-CM | POA: Insufficient documentation

## 2022-02-22 DIAGNOSIS — I10 Essential (primary) hypertension: Secondary | ICD-10-CM

## 2022-02-22 MED ORDER — LOSARTAN POTASSIUM 50 MG PO TABS: 50.0000 mg | ORAL_TABLET | Freq: Every day | ORAL | 3 refills | Status: DC

## 2022-02-22 NOTE — Assessment & Plan Note (Signed)
Father had bypass grafting in his 66s.

## 2022-02-22 NOTE — Assessment & Plan Note (Signed)
History of essential hypertension on losartan 25 mg daily EKG normal with blood pressure measured today at 152/86.  He did have renal Doppler studies performed 07/03/2020 that showed no evidence of renal artery stenosis.  He checks his blood pressure on a daily basis which runs on "the high side".  I am going to increase his losartan from 25 to 50 mg a day.  I will have him keep a 30-day blood pressure log and see a Pharm.D. back in 4 weeks to review and titrate his medications.

## 2022-02-22 NOTE — Assessment & Plan Note (Signed)
History of atypical chest pain in the past with a Myoview performed 04/10/2013 that was low risk, nonischemic with normal wall motion.  Given his family history I am going to get a coronary calcium score to further evaluate.

## 2022-02-22 NOTE — Patient Instructions (Signed)
Medication Instructions:  Your physician has recommended you make the following change in your medication:   -Increase losartan (cozaar) to '50mg'$  once daily.  *If you need a refill on your cardiac medications before your next appointment, please call your pharmacy*   Testing/Procedures: Dr. Gwenlyn Found has ordered a CT coronary calcium score.   Test locations:  [place]   This is $99 out of pocket.   Coronary CalciumScan A coronary calcium scan is an imaging test used to look for deposits of calcium and other fatty materials (plaques) in the inner lining of the blood vessels of the heart (coronary arteries). These deposits of calcium and plaques can partly clog and narrow the coronary arteries without producing any symptoms or warning signs. This puts a person at risk for a heart attack. This test can detect these deposits before symptoms develop. Tell a health care provider about: Any allergies you have. All medicines you are taking, including vitamins, herbs, eye drops, creams, and over-the-counter medicines. Any problems you or family members have had with anesthetic medicines. Any blood disorders you have. Any surgeries you have had. Any medical conditions you have. Whether you are pregnant or may be pregnant. What are the risks? Generally, this is a safe procedure. However, problems may occur, including: Harm to a pregnant woman and her unborn baby. This test involves the use of radiation. Radiation exposure can be dangerous to a pregnant woman and her unborn baby. If you are pregnant, you generally should not have this procedure done. Slight increase in the risk of cancer. This is because of the radiation involved in the test. What happens before the procedure? No preparation is needed for this procedure. What happens during the procedure? You will undress and remove any jewelry around your neck or chest. You will put on a hospital gown. Sticky  electrodes will be placed on your chest. The electrodes will be connected to an electrocardiogram (ECG) machine to record a tracing of the electrical activity of your heart. A CT scanner will take pictures of your heart. During this time, you will be asked to lie still and hold your breath for 2-3 seconds while a picture of your heart is being taken. The procedure may vary among health care providers and hospitals. What happens after the procedure? You can get dressed. You can return to your normal activities. It is up to you to get the results of your test. Ask your health care provider, or the department that is doing the test, when your results will be ready. Summary A coronary calcium scan is an imaging test used to look for deposits of calcium and other fatty materials (plaques) in the inner lining of the blood vessels of the heart (coronary arteries). Generally, this is a safe procedure. Tell your health care provider if you are pregnant or may be pregnant. No preparation is needed for this procedure. A CT scanner will take pictures of your heart. You can return to your normal activities after the scan is done. This information is not intended to replace advice given to you by your health care provider. Make sure you discuss any questions you have with your health care provider. Document Released: 07/09/2007 Document Revised: 11/30/2015 Document Reviewed: 11/30/2015 Elsevier Interactive Patient Education  2017 [place]: At [facility], you and your health needs are our priority.  As part of our continuing mission to provide you with exceptional heart care, we have created designated Provider  Care Teams.  These Care Teams include your primary Cardiologist (physician) and Advanced Practice Providers (APPs -  Physician Assistants and Nurse Practitioners) who all work together to provide you with the care you need, when you need it.  We recommend signing up for  the patient portal called "MyChart".  Sign up information is provided on this After Visit Summary.  MyChart is used to connect with patients for Virtual Visits (Telemedicine).  Patients are able to view lab/test results, encounter notes, upcoming appointments, etc.  Non-urgent messages can be sent to your provider as well.   To learn more about what you can do with MyChart, go to [email or web address].    Your next appointment:   6 month(s)  Provider:   Quay Burow, MD   Other Instructions Dr. Gwenlyn Found has requested that you schedule an appointment with one of our clinical pharmacists for a blood pressure check appointment within the next 4 weeks.  If you monitor your blood pressure (BP) at home, please bring your BP cuff and your BP readings with you to this appointment  HOW TO TAKE YOUR BLOOD PRESSURE: Rest 5 minutes before taking your blood pressure. Don't smoke or drink caffeinated beverages for at least 30 minutes before. Take your blood pressure before (not after) you eat. Sit comfortably with your back supported and both feet on the floor (don't cross your legs). Elevate your arm to heart level on a table or a desk. Use the proper sized cuff. It should fit smoothly and snugly around your bare upper arm. There should be enough room to slip a fingertip under the cuff. The bottom edge of the cuff should be 1 inch above the crease of the elbow. Ideally, take 3 measurements at one sitting and record the average.

## 2022-02-22 NOTE — Progress Notes (Signed)
02/22/2022 Jonathan Garrison   [birth year]  [id number]  Primary Physician Copland, Gay Filler, MD Primary Cardiologist: [name] MD Lupe Carney, [place]  HPI:  Jonathan Garrison is a 77 y.o.  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 He was referred by Dr. [name] at urgent care on [facility] for cardiovascular evaluation because of chest pain.  I last saw him in the office 04/17/2013.  His cardiovascular risk factor profile is remarkable only for family history. His father had bypass surgery in his 36s. He does not smoke. He is not diabetic, hyperlipidemic or hypertensive. He said Reflux type symptoms for decades worse in the last year or 2. Symptoms occur with recumbency, after certain meals and occasionally with exercise. The chest pain is substernal and does not radiate. A Myoview stress test was performed 04/10/13 which was low risk, nonischemic with normal wall motion.  Since I saw him 9 years ago he is remained stable.  He is very active and walks daily.  [place] [facility] [place] [facility]  He also [employer].  He has noticed his blood pressure has been rising over the years.  He had renal Doppler studies performed 07/03/2020 which were normal.  He gets occasional atypical chest pain.  He was recently placed on losartan 25 mg a day.  He checks his blood pressure daily.   Current Meds  Medication Sig   amphetamine-dextroamphetamine (ADDERALL) 20 MG tablet Take 1 tablet (20 mg total) by mouth 2 (two) times daily.   aspirin 81 MG tablet Take 81 mg by mouth daily.   b complex vitamins tablet Take 0.5 tablets by mouth daily.   Biotin 5000 MCG CAPS Take 1 capsule by mouth daily.   Calcium-Magnesium-Zinc 086-57-8 MG TABS Take by mouth. 1 tablet daily   Cholecalciferol (VITAMIN D3) 2000 UNITS capsule Take 2,000 Units by mouth daily.   ferrous sulfate 324 (65 Fe) MG TBEC Take one every other day for low iron   Fluorouracil (TOLAK) 4 % CREA  Total of 28 applications to the area   fluticasone (FLONASE) 50 MCG/ACT nasal spray SHAKE LIQUID AND USE 2 SPRAYS IN EACH NOSTRIL DAILY   glucosamine-chondroitin 500-400 MG tablet Take 1 tablet by mouth 2 (two) times daily.   losartan (COZAAR) 25 MG tablet Take 1 tablet (25 mg total) by mouth daily.   meloxicam (MOBIC) 15 MG tablet TAKE 1 TABLET(15 MG) BY MOUTH DAILY AS NEEDED   Multiple Vitamins-Minerals (MULTIVITAMIN WITH MINERALS) tablet Take 1 tablet by mouth daily.   NON FORMULARY ALJ -herbal decongestant  Takes as needed   omeprazole (PRILOSEC) 40 MG capsule Take 1 capsule (40 mg total) by mouth in the morning and at bedtime.   sildenafil (REVATIO) 20 MG tablet Take 2-3 tablets one hour prior to intercourse   tacrolimus (PROTOPIC) 0.1 % ointment Apply topically daily.   Zn-Pyg Afri-Nettle-Saw Palmet (SAW PALMETTO COMPLEX PO) Take 1 tablet by mouth 2 (two) times daily.     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)    Employer: [employer]  Tobacco Use   Smoking status: Former    Types:  Pipe, Cigarettes    Quit date: 01/25/1976    Years since quitting: 46.1   Smokeless tobacco: Never   Tobacco comments:    Quit smoking 35 years ago  Vaping Use   Vaping Use: Never used  Substance and Sexual Activity   Alcohol use: Yes    Alcohol/week: 7.0 - 14.0 standard drinks of alcohol    Types: 7 - 14 Glasses of wine per week   Drug use: No   Sexual activity: Not on file  Other Topics Concern   Not on file  Social History Narrative   Not on file   Social Determinants of Health   Financial Resource Strain: Low Risk  (01/04/2022)   Overall Financial Resource Strain (CARDIA)    Difficulty of Paying Living Expenses: Not hard at all   Food Insecurity: No Food Insecurity (01/04/2022)   Hunger Vital Sign    Worried About Running Out of Food in the Last Year: Never true    Ran Out of Food in the Last Year: Never true  Transportation Needs: No Transportation Needs (01/04/2022)   PRAPARE - Hydrologist (Medical): No    Lack of Transportation (Non-Medical): No  Physical Activity: Sufficiently Active (01/04/2022)   Exercise Vital Sign    Days of Exercise per Week: 6 days    Minutes of Exercise per Session: 60 min  Stress: No Stress Concern Present (01/04/2022)   [place]    Feeling of Stress : Only a little  Social Connections: Moderately Isolated (01/04/2022)   Social Connection and Isolation Panel [NHANES]    Frequency of Communication with Friends and Family: Never    Frequency of Social Gatherings with Friends and Family: More than three times a week    Attends Religious Services: Never    Marine scientist or Organizations: Yes    Attends Music therapist: More than 4 times per year    Marital Status: Divorced  Intimate Partner Violence: Not At Risk (01/04/2022)   Humiliation, Afraid, Rape, and Kick questionnaire    Fear of Current or Ex-Partner: No    Emotionally Abused: No    Physically Abused: No    Sexually Abused: No     Review of Systems: General: negative for chills, fever, night sweats or weight changes.  Cardiovascular: negative for chest pain, dyspnea on exertion, edema, orthopnea, palpitations, paroxysmal nocturnal dyspnea or shortness of breath Dermatological: negative for rash Respiratory: negative for cough or wheezing Urologic: negative for hematuria Abdominal: negative for nausea, vomiting, diarrhea, bright red blood per rectum, melena, or hematemesis Neurologic: negative for visual changes, syncope, or dizziness All other systems reviewed and are otherwise negative except as  noted above.    Blood pressure (!) 152/86, pulse 76, height '6\' 2"'$  (1.88 m), weight 200 lb 9.6 oz (91 kg), SpO2 96 %.  General appearance: alert and no distress Neck: no adenopathy, no carotid bruit, no JVD, supple, symmetrical, trachea midline, and thyroid not enlarged, symmetric, no tenderness/mass/nodules Lungs: clear to auscultation bilaterally Heart: regular rate and rhythm, S1, S2 normal, no murmur, click, rub or gallop Extremities: extremities normal, atraumatic, no cyanosis or edema Pulses: 2+ and symmetric Skin: Skin color, texture, turgor normal. No rashes or lesions Neurologic: Grossly normal  EKG sinus rhythm at 76 with voltage criteria for LVH.  I personally reviewed this EKG.  ASSESSMENT AND PLAN:   Chest pain History of atypical chest pain in the past  with a Myoview performed 04/10/2013 that was low risk, nonischemic with normal wall motion.  Given his family history I am going to get a coronary calcium score to further evaluate.  Essential hypertension History of essential hypertension on losartan 25 mg daily EKG normal with blood pressure measured today at 152/86.  He did have renal Doppler studies performed 07/03/2020 that showed no evidence of renal artery stenosis.  He checks his blood pressure on a daily basis which runs on "the high side".  I am going to increase his losartan from 25 to 50 mg a day.  I will have him keep a 30-day blood pressure log and see a Pharm.D. back in 4 weeks to review and titrate his medications.  Family history of heart disease Father had bypass grafting in his 71s.     [name] MD FACP,FACC,FAHA, [facility] 02/22/2022 2:39 PM

## 2022-03-07 ENCOUNTER — Ambulatory Visit (INDEPENDENT_AMBULATORY_CARE_PROVIDER_SITE_OTHER): Payer: Medicare Other | Admitting: Physical Therapy

## 2022-03-07 ENCOUNTER — Encounter: Payer: Self-pay | Admitting: Cardiovascular Disease

## 2022-03-07 ENCOUNTER — Telehealth: Payer: Self-pay | Admitting: Physical Medicine and Rehabilitation

## 2022-03-07 ENCOUNTER — Encounter: Payer: Self-pay | Admitting: Physical Therapy

## 2022-03-07 DIAGNOSIS — I2510 Atherosclerotic heart disease of native coronary artery without angina pectoris: Secondary | ICD-10-CM

## 2022-03-07 DIAGNOSIS — R262 Difficulty in walking, not elsewhere classified: Secondary | ICD-10-CM

## 2022-03-07 DIAGNOSIS — M6281 Muscle weakness (generalized): Secondary | ICD-10-CM | POA: Diagnosis not present

## 2022-03-07 DIAGNOSIS — G8929 Other chronic pain: Secondary | ICD-10-CM | POA: Diagnosis not present

## 2022-03-07 DIAGNOSIS — M25561 Pain in right knee: Secondary | ICD-10-CM

## 2022-03-07 DIAGNOSIS — M25552 Pain in left hip: Secondary | ICD-10-CM

## 2022-03-07 DIAGNOSIS — M5459 Other low back pain: Secondary | ICD-10-CM

## 2022-03-07 DIAGNOSIS — R0789 Other chest pain: Secondary | ICD-10-CM

## 2022-03-07 NOTE — Therapy (Signed)
OUTPATIENT PHYSICAL THERAPY TREATMENT   Patient Name: Jonathan Garrison MRN: [id number] DOB:1945/05/18, 77 y.o., male Today's Date: 03/07/2022  END OF SESSION:  PT End of Session - 03/07/22 1343     Visit Number 3    Number of Visits 6    Date for PT Re-Evaluation 05/02/22    Authorization Type MCR    Progress Note Due on Visit 10    PT Start Time 1344    PT Stop Time 1436    PT Time Calculation (min) 52 min    Activity Tolerance Patient tolerated treatment well    Behavior During Therapy WFL for tasks assessed/performed             Past Medical History:  Diagnosis Date   ADD (attention deficit disorder) without hyperactivity    Allergy    Anemia    no treatment   Anxiety    Arthralgia    many joints, managed with vitamins/herbs and ibuprofen   Arthritis    Cataract    Chest pain    Colitis 1986   single attack (believes was ulcerative)   Depression    Eczema    Esophageal stricture    GERD (gastroesophageal reflux disease)    Hemorrhoids    Lichen planus    Neuromuscular disorder ([place])    POLIO AGE 47   Oral cancer ([place]) 05/2010   plans for surgery 06/2010   Osteoporosis    hips   Pleurisy 1984   Polio age 89   Umbilical hernia    Past Surgical History:  Procedure Laterality Date   COLONOSCOPY  2012   LEG SURGERY  when in 6th grade   L leg, bone spur removal (just above knee, laterally)   TONGUE SURGERY     for tongue cancer   UPPER GASTROINTESTINAL ENDOSCOPY     Patient Active Problem List   Diagnosis Date Noted   Essential hypertension 02/22/2022   Family history of heart disease 02/22/2022   Increased prostate specific antigen (PSA) velocity 11/27/2017   Osteopenia 08/01/2017   Vitamin B12 deficiency anemia 06/09/2014   Chest pain 03/13/2013   Depression with anxiety 08/26/2011   Overweight (BMI 25.0-29.9) 08/26/2011   Joint pain 08/26/2011   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   Oral cancer ([place]) 07/09/2010    PCP: Darreld Mclean, MD   REFERRING PROVIDER: Vanetta Mulders, MD  REFERRING DIAG: M53.3,G89.29 (ICD-10-CM) - Chronic left SI joint pain. Rt knee pain  Rationale for Evaluation and Treatment: Rehabilitation  THERAPY DIAG:  Other low back pain  Pain in left hip  Chronic pain of right knee  Muscle weakness (generalized)  Difficulty in walking, not elsewhere classified  ONSET DATE: chronic pain for years  SUBJECTIVE:  SUBJECTIVE STATEMENT: He reports he was a little less active this past week, but overall doing well in regards to his hip. He reports he is just feeling some discomfort in his right knee.   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 Yes: NPRS scale: right knee 4-5/10 Pain location: Rt knee and left SIJ Pain description: ache Aggravating factors: stairs, worse first thing in the morning Relieving factors: meloxicam, voltaren  PRECAUTIONS: None  WEIGHT BEARING RESTRICTIONS: No  FALLS:  Has patient fallen in last 6 months? No  OCCUPATION: retired  PLOF: Independent  PATIENT GOALS: reduce pain,   NEXT MD VISIT:   OBJECTIVE:   DIAGNOSTIC FINDINGS:  Xray (right knee 4 views, left knee 4 views): Right knee with very mild degenerative findings particularly about the medial joint space  PATIENT SURVEYS:  Eval: FOTO 67% functional   COGNITION: Overall cognitive status: Within functional limits for tasks assessed     SENSATION: WFL  MUSCLE LENGTH: Hamstrings: mildly tight at 80 deg bilat ITB tightness mildly on Rt  POSTURE:   PALPATION: Tender to palpation over Lt SIJ  LUMBAR ROM:   AROM eval  Flexion WNL  Extension WNL  Right lateral flexion WNL  Left lateral flexion WNL  Right rotation WNL  Left rotation WNL   (Blank rows = not  tested)  LOWER EXTREMITY ROM:     Active  Right eval Left eval  Hip flexion [facility]  Hip extension    Hip abduction [facility]  Hip adduction    Hip internal rotation    Hip external rotation    Knee flexion [facility] WFL  Knee extension [facility] WFL  Ankle dorsiflexion    Ankle plantarflexion    Ankle inversion    Ankle eversion     (Blank rows = not tested)  LOWER EXTREMITY MMT:    MMT Right eval Left eval  Hip flexion 4+ 4+  Hip extension    Hip abduction 4+ 4+  Hip adduction    Hip internal rotation    Hip external rotation    Knee flexion 5 5  Knee extension 5- 5-  Ankle dorsiflexion 4 5  Ankle plantarflexion    Ankle inversion    Ankle eversion     (Blank rows = not tested)  LUMBAR SPECIAL TESTS:  Eval: Straight leg raise test: Negative and Slump test: Negative Negative SIJ testing No pain with PAM testing to lumbar spine  FUNCTIONAL TESTS:  Eval: SLS X 10 seconds on left, 3-4 seconds on Rt  GAIT: Eval: Comments: independent community Ambulator, 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  TODAY'S TREATMENT:  03/07/22 -Recumbent bike level 6-8 x69mn, cool down level 4 x235m -Supine SLR #2 ankle weight, 2X12 reps bilat -Supine DLR #2, 2x6 -Sidelying straight leg hip abd #2 ankle weight, 2x12 reps bilat -Prone straight leg hip extension #2 ankle weight, 2x12 reps bilat -Quadriped bird dogs 2x6 bilat -Blazepods in longsitting with two pods on each side tapping with soccer ball 30 sec on, 30 sec rest x3 rounds (right leg up for first round, left leg up for second round,both legs up third round) (to simulate kayaking) -Standing ITB stretch with one UE support x30 sec bilat  -Supine SLR crossover ITB stretch x30 sec bilat -Hooklying piriformis stretch x30 sec bilat  Neuro Re-ed -Tandem walk without UE support 3 round with supervision -Sidestepping on foam beam without UE support in bars 3 round trips with supervision -Blazepods SLS with  four pods 30 sec on, 30 sec rest x2 bilat  with supervision    02/21/22 -Supine SLR X 12 reps bilat -Sidelying straight leg hip abd X 12 reps bilat -Prone straight leg hip extension X 12 reps bilat -Supine piriformis stretch 30 sec X 2 bilat -Supine butterfly stretch for hip adductors 30 sec X 2 -Quadriped bird dogs X 12 bilat -Sit to stands, no UE support with slow eccentric lower X 12 reps Neuro Re-ed -SLS 15 sec X 3 bilat -Tandem walk without UE support 6 round trips in bars -sidestepping on foam beam without UE support in bars 6 round trips  Manual therapy: Bilat hip PROM, long axis distraction, manual stretching for hamstrings and ITBand    PATIENT EDUCATION: Education details: HEP, PT plan of care Person educated: Patient Education method: Explanation, Demonstration, Verbal cues, and Handouts Education comprehension: verbalized understanding and needs further education   HOME EXERCISE PROGRAM: Access Code: [email or web address] URL: [email or web address] Date: 02/21/2022 Prepared by: Elsie Ra, progressed on 02/21/22  Exercises - Single Leg Bridge  - 2 x daily - 6 x weekly - 1-2 sets - 10 reps - 5 hold - Seated Hamstring Stretch  - 2 x daily - 6 x weekly - 1 sets - 2 reps - 30 hold - Standing ITB Stretch  - 2 x daily - 6 x weekly - 2-3 sets - 30 hold - Heel Toe Raises with Counter Support  - 2 x daily - 6 x weekly - 2-3 sets - 10 reps - Single Leg Stance  - 2 x daily - 6 x weekly - 1 sets - 5 reps - 10 sec hold - Side Stepping with Resistance at Ankles  - 2 x daily - 6 x weekly - 2-3 sets - 10 reps - Sit to Stand Without Arm Support  - 2 x daily - 6 x weekly - 1-2 sets - 10 reps - Supine Active Straight Leg Raise  - 2 x daily - 6 x weekly - 1-2 sets - 12 reps - Sidelying Hip Abduction (Both Sides)  - 2 x daily - 6 x weekly - 1-2 sets - 12 reps - Prone Hip Extension  - 2 x daily - 6 x weekly - 1-2 sets - 12 reps - Bird Dog  - 2 x daily - 6 x weekly - 1 sets - 12 reps - 2 sec hold - Supine Piriformis Stretch  with Foot on Ground  - 2 x daily - 6 x weekly - 2 sets - 30 hold - Tandem Walking  - 2 x daily - 6 x weekly - 3 sets - 12 reps  ASSESSMENT:  CLINICAL IMPRESSION: He tolerated treatment well today with added weight on strength exercises and additional challenges with BlazePods. He still shows some limitations in balance especially dynamic balance and overall LE strength. He will continue to benefit from skilled PT to address these limitations to increase function. He has now met his short term PT goals.  OBJECTIVE IMPAIRMENTS: decreased activity tolerance, difficulty walking, decreased balance, decreased endurance, decreased mobility, decreased ROM, decreased strength, impaired flexibility, impaired LE use, postural dysfunction, and pain.  ACTIVITY LIMITATIONS: bending, lifting, carry, locomotion, cleaning, community activity,  PERSONAL FACTORS: childhood polio, OA are also affecting patient's functional outcome.  REHAB POTENTIAL: Good  CLINICAL DECISION MAKING: Stable/uncomplicated  EVALUATION COMPLEXITY: Low    GOALS: Short term PT Goals Target date: 03/07/2022   Pt will be I and compliant with HEP. Baseline:  Goal status: MET 03/07/22  Pt will decrease pain by 25% overall Baseline: Goal status: MET 03/07/22  Long term PT goals Target date:05/02/2022   Pt will improve  hip/knee strength to at least 5-/5 MMT to improve functional strength Baseline: Goal status: New Pt will improve FOTO to at least 69% functional to show improved function Baseline: Goal status: New Pt will reduce pain to overall less than 2-3/10 with usual activity and kayaking Baseline: Goal status: New Pt will be able to hold SLS X 10 seconds on avg for his Rt leg to show improved balance.  Baseline: 3-4 seconds on Rt Goal status  PLAN: PT FREQUENCY: 1 times per 2 weeks  PT DURATION: 12 weeks  PLANNED INTERVENTIONS (unless contraindicated): aquatic PT, Canalith repositioning, cryotherapy, Electrical  stimulation, Iontophoresis with 4 mg/ml dexamethasome, Moist heat, traction, Ultrasound, gait training, Therapeutic exercise, balance training, neuromuscular re-education, patient/family education, prosthetic training, manual techniques, passive ROM, dry needling, taping, vasopnuematic device, vestibular, spinal manipulations, joint manipulations  PLAN FOR NEXT SESSION: review HEP, quad strength, ITB stretching, Hip/SIJ stabilization work, balance challenges.   Payten Beaumier, Student-PT 03/07/2022, 2:43 PM

## 2022-03-07 NOTE — Telephone Encounter (Signed)
Patient states he missed a call for an appointment

## 2022-03-08 ENCOUNTER — Ambulatory Visit (HOSPITAL_BASED_OUTPATIENT_CLINIC_OR_DEPARTMENT_OTHER)
Admission: RE | Admit: 2022-03-08 | Discharge: 2022-03-08 | Disposition: A | Discharge status: Home / Self Care | Payer: Medicare Other | Source: Ambulatory Visit | Attending: Cardiovascular Disease | Admitting: Cardiovascular Disease

## 2022-03-08 DIAGNOSIS — R0789 Other chest pain: Secondary | ICD-10-CM | POA: Insufficient documentation

## 2022-03-08 DIAGNOSIS — Z8249 Family history of ischemic heart disease and other diseases of the circulatory system: Secondary | ICD-10-CM | POA: Insufficient documentation

## 2022-03-08 DIAGNOSIS — I10 Essential (primary) hypertension: Secondary | ICD-10-CM | POA: Insufficient documentation

## 2022-03-08 NOTE — Telephone Encounter (Signed)
Spoke with patient and scheduled OV for 03/16/22 and injection for 03/24/22

## 2022-03-09 ENCOUNTER — Other Ambulatory Visit: Payer: Self-pay

## 2022-03-09 DIAGNOSIS — Z79899 Other long term (current) drug therapy: Secondary | ICD-10-CM

## 2022-03-09 DIAGNOSIS — E7800 Pure hypercholesterolemia, unspecified: Secondary | ICD-10-CM

## 2022-03-09 DIAGNOSIS — F422 Mixed obsessional thoughts and acts: Secondary | ICD-10-CM | POA: Diagnosis not present

## 2022-03-09 MED ORDER — ATORVASTATIN CALCIUM 40 MG PO TABS: 40.0000 mg | ORAL_TABLET | Freq: Every day | ORAL | 3 refills | Status: DC

## 2022-03-09 NOTE — Telephone Encounter (Signed)
[name], MD  to Jonathan Beaver, LPN     [phone]  [id number] AM Only calcium in LAD. That is true. Giver his CRF and atypical CP symptoms, let's get an exercise MV stress test to further eval

## 2022-03-09 NOTE — Progress Notes (Signed)
Prescription sent to pharmacy. Lab orders placed for 3 month repeat.

## 2022-03-10 ENCOUNTER — Telehealth (HOSPITAL_COMMUNITY): Payer: Self-pay | Admitting: Radiology

## 2022-03-10 ENCOUNTER — Other Ambulatory Visit: Payer: Self-pay | Admitting: Family Medicine

## 2022-03-10 ENCOUNTER — Encounter: Payer: Self-pay | Admitting: Family Medicine

## 2022-03-10 DIAGNOSIS — F988 Other specified behavioral and emotional disorders with onset usually occurring in childhood and adolescence: Secondary | ICD-10-CM

## 2022-03-10 DIAGNOSIS — M542 Cervicalgia: Secondary | ICD-10-CM

## 2022-03-10 MED ORDER — AMPHETAMINE-DEXTROAMPHETAMINE 20 MG PO TABS: 20.0000 mg | ORAL_TABLET | Freq: Two times a day (BID) | ORAL | 0 refills | Status: DC

## 2022-03-10 MED ORDER — MELOXICAM 15 MG PO TABS: ORAL_TABLET | ORAL | 2 refills | Status: DC

## 2022-03-10 NOTE — Telephone Encounter (Signed)
LDM on patient's cell phone per his DPR. He was reminded to wear sneakers and comfortable clothing and also to arrive 15 minutes earlier than his scheduled  time.

## 2022-03-10 NOTE — Telephone Encounter (Signed)
Patient given detailed instructions per Myocardial Perfusion Study Information Sheet for the test on 03/11/2022 at 12:30. Patient notified to arrive 15 minutes early and that it is imperative to arrive on time for appointment to keep from having the test rescheduled.  If you need to cancel or reschedule your appointment, please call the office within 24 hours of your appointment. . Patient verbalized understanding.EHK

## 2022-03-11 ENCOUNTER — Ambulatory Visit (HOSPITAL_COMMUNITY): Payer: Medicare Other | Attending: Cardiology

## 2022-03-11 DIAGNOSIS — I2584 Coronary atherosclerosis due to calcified coronary lesion: Secondary | ICD-10-CM

## 2022-03-11 DIAGNOSIS — R0789 Other chest pain: Secondary | ICD-10-CM

## 2022-03-11 DIAGNOSIS — I2510 Atherosclerotic heart disease of native coronary artery without angina pectoris: Secondary | ICD-10-CM

## 2022-03-11 LAB — MYOCARDIAL PERFUSION IMAGING
Angina Index: 0
Duke Treadmill Score: -6
Estimated workload: 5.2
Exercise duration (min): 4 min
Exercise duration (sec): 14 s
LV dias vol: 88 mL (ref 62–150)
LV sys vol: 26 mL
MPHR: 145 {beats}/min
Nuc Stress EF: 70 %
Peak HR: 153 {beats}/min
Percent HR: 105 %
Rest HR: 73 {beats}/min
Rest Nuclear Isotope Dose: 10.6 mCi
SDS: 0
SRS: 0
SSS: 0
ST Depression (mm): 2 mm
Stress Nuclear Isotope Dose: 31.9 mCi
TID: 0.84

## 2022-03-11 MED ORDER — TECHNETIUM TC 99M TETROFOSMIN IV KIT
31.9000 | PACK | Freq: Once | INTRAVENOUS | Status: AC | PRN
  Administered 2022-03-11: 31.9 via INTRAVENOUS

## 2022-03-11 MED ORDER — TECHNETIUM TC 99M TETROFOSMIN IV KIT
10.6000 | PACK | Freq: Once | INTRAVENOUS | Status: AC | PRN
  Administered 2022-03-11: 10.6 via INTRAVENOUS

## 2022-03-16 ENCOUNTER — Telehealth: Payer: Self-pay | Admitting: Radiology

## 2022-03-16 ENCOUNTER — Encounter: Payer: Self-pay | Admitting: Physical Medicine and Rehabilitation

## 2022-03-16 ENCOUNTER — Ambulatory Visit (INDEPENDENT_AMBULATORY_CARE_PROVIDER_SITE_OTHER): Payer: Medicare Other | Admitting: Physical Medicine and Rehabilitation

## 2022-03-16 DIAGNOSIS — I2510 Atherosclerotic heart disease of native coronary artery without angina pectoris: Secondary | ICD-10-CM

## 2022-03-16 DIAGNOSIS — M533 Sacrococcygeal disorders, not elsewhere classified: Secondary | ICD-10-CM | POA: Diagnosis not present

## 2022-03-16 DIAGNOSIS — M25552 Pain in left hip: Secondary | ICD-10-CM

## 2022-03-16 DIAGNOSIS — G8929 Other chronic pain: Secondary | ICD-10-CM

## 2022-03-16 DIAGNOSIS — I2584 Coronary atherosclerosis due to calcified coronary lesion: Secondary | ICD-10-CM

## 2022-03-16 DIAGNOSIS — M545 Low back pain, unspecified: Secondary | ICD-10-CM

## 2022-03-16 NOTE — Progress Notes (Signed)
Jonathan Garrison - 77 y.o. male MRN [id number]  Date of birth: July 26, 1945  Office Visit Note: Visit Date: 03/16/2022 PCP: Darreld Mclean, MD Referred by: Vanetta Mulders, MD  Subjective: No chief complaint on file.  HPI: Jonathan Garrison is a 77 y.o. male who comes in today per the request of the patient for evaluation of chronic left sided lower back pain and left sided lateral hip/thigh pain. Dr. Vanetta Mulders referred him to [place] for left sacroiliac joint injection diagnostically and therapeutically.  The patient felt like his lateral hip pain was more of an issue and wanted to see [place] first before having any injections performed.  He reports pain ongoing for several years and worsens with activity and movement. Left hip/thigh pain seems to be biggest concern. States difficulty walking up stairs due to pain. Pain is most severe in the mornings after waking up. He describes pain as sore and aching, currently rates as 2 out of 10. Some relief of pain with home exercise regimen, rest and use of medications. Good relief of pain with Meloxicam and Tylenol. He is currently attending formal physical therapy with our in house team. Also reports significant relief of pain with stretching exercises at home. Lumbar x-rays from 2022 exhibit mild degenerative changes. Left hip x-rays also from 2022 show minimal degenerative changes. Dr. Sammuel Hines performed left sub IT band injection recently, he reports greater than 90% relief with this procedure. 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 He was previously very active, recently stopped playing basketball due to knee issues. States kayaking season is approaching and he would like to be able to participate if possible. He does walk several miles a day given his pain is not severe. Patient requesting to meet with [place] first prior to any  injections because of a historical problem with a  prior spinal tap.  He essentially reports severe panic attack with what he referred to as "psychotic break ".  Patient denies focal weakness, numbness and tingling. No recent trauma or falls.    Review of Systems  Musculoskeletal:  Positive for back pain and joint pain.  Neurological:  Negative for tingling, sensory change, focal weakness and weakness.  All other systems reviewed and are negative.  Otherwise per HPI.  Assessment & Plan: Visit Diagnoses:    ICD-10-CM   1. Chronic left-sided low back pain without sciatica  M54.50    G89.29     2. Pain in left hip  M25.552     3. Sacroiliac joint pain  M53.3        Plan: Findings:  Chronic left sided lower back pain and left sided lateral hip/thigh pain. Lower back pain is manageable at home with stretching, biggest concern is left sided lateral hip pain. He continues to conservative therapies such as formal physical therapy, home exercise regimen, rest and use of medications. His clinical presentation and exam are complex, differentials could include left sacroiliac joint pain, left greater trochanteric bursitis, intrinsic left hip issue, and lumbar etiology such as nerve root irritation. We did discuss sacroiliac joint injection procedure using spine model. We were able to ease his concerns and reassure him that injection is performed safely using fluoroscopic guidance. He would like to hold on left sacroiliac joint injection at this time as his pain is mild today. He would like to discuss possible left hip etiology  further with Dr. Sammuel Hines. We are happy to see patient back as needed. No red flag symptoms noted upon exam today.     Meds & Orders: No orders of the defined types were placed in this encounter.  No orders of the defined types were placed in this encounter.   Follow-up: Return if symptoms worsen or fail to improve.   Procedures: No procedures performed      Clinical  History: No specialty comments available.   He reports that he quit smoking about 46 years ago. His smoking use included pipe and cigarettes. He has never used smokeless tobacco.  Recent Labs    03/18/21 0954  HGBA1C 5.2    Objective:  VS:  HT:    WT:   BMI:     BP:   HR: bpm  TEMP: ( )  RESP:  Physical Exam Vitals and nursing note reviewed.  HENT:     Head: Normocephalic and atraumatic.     Right Ear: External ear normal.     Left Ear: External ear normal.     Nose: Nose normal.     Mouth/Throat:     Mouth: Mucous membranes are moist.  Eyes:     Extraocular Movements: Extraocular movements intact.  Cardiovascular:     Rate and Rhythm: Normal rate.     Pulses: Normal pulses.  Pulmonary:     Effort: Pulmonary effort is normal.  Abdominal:     General: Abdomen is flat. There is no distension.  Musculoskeletal:        General: Tenderness present.     Cervical back: Normal range of motion.     Comments: Pt rises from seated position to standing without difficulty. Good lumbar range of motion. Strong distal strength without clonus, pain upon palpation of left greater trochanter. No pain noted with internal/external radiation of hips. Positive FABER, [place] and compression testing on the left. Sensation intact bilaterally. Walks independently, gait steady.   Skin:    General: Skin is warm and dry.     Capillary Refill: Capillary refill takes less than 2 seconds.  Neurological:     General: No focal deficit present.     Mental Status: He is alert and oriented to person, place, and time.  Psychiatric:        Mood and Affect: Mood normal.        Behavior: Behavior normal.     Ortho Exam  Imaging: No results found.  Past Medical/Family/Surgical/Social History: Medications & Allergies reviewed per EMR, new medications updated. 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   Oral cancer ([place]) 07/09/2010   Past Medical History:  Diagnosis Date   ADD (attention deficit disorder) without hyperactivity    Allergy    Anemia    no treatment   Anxiety    Arthralgia    many joints, managed with vitamins/herbs and ibuprofen   Arthritis    Cataract    Chest pain    Colitis 1986   single attack (believes was ulcerative)   Depression    Eczema    Esophageal stricture    GERD (gastroesophageal reflux  disease)    Hemorrhoids    Lichen planus    Neuromuscular disorder ([place])    POLIO AGE 25   Oral cancer ([place]) 05/2010   plans for surgery 06/2010   Osteoporosis    hips   Pleurisy 1984   Polio age 71   Umbilical hernia    Family History  Problem Relation Age of Onset   Hypertension Mother    Diabetes Mother    Hyperlipidemia Mother    Hypertension Father    Benign prostatic hyperplasia Father    Heart disease Father    Lichen planus Sister    Cancer Sister        ?spot on chest? 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  Tobacco Use   Smoking status: Former    Types: Pipe, Cigarettes    Quit date: 01/25/1976     Years since quitting: 46.1   Smokeless tobacco: Never   Tobacco comments:    Quit smoking 35 years ago  Vaping Use   Vaping Use: Never used  Substance and Sexual Activity   Alcohol use: Yes    Alcohol/week: 7.0 - 14.0 standard drinks of alcohol    Types: 7 - 14 Glasses of wine per week   Drug use: No   Sexual activity: Not on file

## 2022-03-16 NOTE — Telephone Encounter (Signed)
FYI---Patient called and states that he has an appointment @ 130 with Megan, but he wanted to make sure that he was also going to be able to talk to the Dr. As well. I advised that usually Jinny Blossom will see him and then Dr. Ernestina Patches can come in and go over details with him.

## 2022-03-16 NOTE — Progress Notes (Signed)
Functional Pain Scale - descriptive words and definitions  Uncomfortable (3)  Pain is present but can complete all ADL's/sleep is slightly affected and passive distraction only gives marginal relief. Mild range order  Average Pain 2-3  Lower back pain that goes into the buttocks. Pain is worse in the mornings, but after taking Meloxicam it gets better

## 2022-03-17 DIAGNOSIS — K121 Other forms of stomatitis: Secondary | ICD-10-CM | POA: Diagnosis not present

## 2022-03-17 DIAGNOSIS — K1329 Other disturbances of oral epithelium, including tongue: Secondary | ICD-10-CM | POA: Diagnosis not present

## 2022-03-19 NOTE — Patient Instructions (Incomplete)
It was good to see again today, I will be in touch with your labs I might suggest seeing Dr Fontaine No, Dr Martin Majestic at [facility]- however really any of their docs are very good

## 2022-03-19 NOTE — Progress Notes (Unsigned)
[facility] at [facility] [address] [place], [place] [address] [phone]  Date:  03/23/2022   Name:  Jonathan Garrison   DOB:  [birth year]   MRN:  [id number]  PCP:  Darreld Mclean, MD    Chief Complaint: No chief complaint on file.   History of Present Illness:  Jonathan Garrison is a 77 y.o. very pleasant male patient who presents with the following:  Patient seen today for Medicare follow-up exam. History of anxiety, depression, oral cancer, osteopenia, B12 deficiency, and GERD.  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   Most recent visit with myself was 1 year ago-however we have exchanged more than a dozen MyChart messages since then  Seen by cardiology in January -Dr. Gwenlyn Found who increased his losartan from 25 to 50 mg for elevated blood pressure at that time They also ordered a coronary calcium-he scored in the 35th percentile Lab work done in May  Aspirin Lipitor 40 Adderall 20 as needed Losartan 50 Prilosec Revatio as needed  COVID vaccination up-to-date ?  Second dose of Shingrix  He enjoys exercise with walking, hiking, kayaking.  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   Oral cancer ([place]) 07/09/2010    Past Medical History:  Diagnosis Date   ADD (attention deficit disorder) without hyperactivity    Allergy    Anemia    no treatment   Anxiety    Arthralgia    many joints, managed with vitamins/herbs and ibuprofen   Arthritis    Cataract    Chest pain    Colitis 1986    single attack (believes was ulcerative)   Depression    Eczema    Esophageal stricture    GERD (gastroesophageal reflux disease)    Hemorrhoids    Lichen planus    Neuromuscular disorder ([place])    POLIO AGE 21   Oral cancer ([place]) 05/2010   plans for surgery 06/2010   Osteoporosis    hips   Pleurisy 1984   Polio age 42   Umbilical hernia     Past Surgical History:  Procedure Laterality Date   COLONOSCOPY  2012   LEG SURGERY  when in 6th grade   L leg, bone spur removal (just above knee, laterally)   TONGUE SURGERY     for tongue cancer   UPPER GASTROINTESTINAL ENDOSCOPY      Social History   Tobacco Use   Smoking status: Former    Types: Pipe, Cigarettes    Quit date: 01/25/1976    Years since quitting: 46.1   Smokeless tobacco: Never   Tobacco comments:    Quit smoking 35 years ago  Vaping Use   Vaping Use: Never used  Substance Use Topics   Alcohol use: Yes    Alcohol/week: 7.0 - 14.0 standard drinks of alcohol    Types: 7 - 14 Glasses of wine per week   Drug use: No    Family History  Problem Relation Age of Onset   Hypertension Mother  Diabetes Mother    Hyperlipidemia Mother    Hypertension Father    Benign prostatic hyperplasia Father    Heart disease Father    Lichen planus Sister    Cancer Sister        ?spot on chest? contained   Diabetes Maternal Grandfather    Colon cancer Neg Hx    Esophageal cancer Neg Hx    Stomach cancer Neg Hx    Pancreatic cancer Neg Hx    Liver disease Neg Hx    Colon polyps Neg Hx    Rectal cancer Neg Hx     No Known Allergies  Medication list has been reviewed and updated.  Current Outpatient Medications on File Prior to Visit  Medication Sig Dispense Refill   amphetamine-dextroamphetamine (ADDERALL) 20 MG tablet Take 1 tablet (20 mg total) by mouth 2 (two) times daily. 60 tablet 0   aspirin 81 MG tablet Take 81 mg by mouth daily.     atorvastatin (LIPITOR) 40 MG tablet Take 1 tablet (40 mg total) by mouth  daily. 90 tablet 3   b complex vitamins tablet Take 0.5 tablets by mouth daily.     Biotin 5000 MCG CAPS Take 1 capsule by mouth daily.     Calcium-Magnesium-Zinc [id number] MG TABS Take by mouth. 1 tablet daily     Cholecalciferol (VITAMIN D3) 2000 UNITS capsule Take 2,000 Units by mouth daily.     ferrous sulfate 324 (65 Fe) MG TBEC Take one every other day for low iron 45 tablet 3   Fluorouracil (TOLAK) 4 % CREA Total of 28 applications to the area 40 g 0   fluticasone (FLONASE) 50 MCG/ACT nasal spray SHAKE LIQUID AND USE 2 SPRAYS IN EACH NOSTRIL DAILY 16 g 11   glucosamine-chondroitin 500-400 MG tablet Take 1 tablet by mouth 2 (two) times daily.     losartan (COZAAR) 50 MG tablet Take 1 tablet (50 mg total) by mouth daily. 90 tablet 3   meloxicam (MOBIC) 15 MG tablet TAKE 1 TABLET(15 MG) BY MOUTH DAILY AS NEEDED 90 tablet 2   Multiple Vitamins-Minerals (MULTIVITAMIN WITH MINERALS) tablet Take 1 tablet by mouth daily.     NON FORMULARY ALJ -herbal decongestant  Takes as needed     omeprazole (PRILOSEC) 40 MG capsule Take 1 capsule (40 mg total) by mouth in the morning and at bedtime. 1803 capsule 3   sildenafil (REVATIO) 20 MG tablet Take 2-3 tablets one hour prior to intercourse 60 tablet 5   tacrolimus (PROTOPIC) 0.1 % ointment Apply topically daily. 100 g 2   Zn-Pyg Afri-Nettle-Saw Palmet (SAW PALMETTO COMPLEX PO) Take 1 tablet by mouth 2 (two) times daily.     No current facility-administered medications on file prior to visit.    Review of Systems:  As per HPI- otherwise negative.   Physical Examination: There were no vitals filed for this visit. There were no vitals filed for this visit. There is no height or weight on file to calculate BMI. Ideal Body Weight:    GEN: no acute distress. HEENT: Atraumatic, Normocephalic.  Ears and Nose: No external deformity. CV: RRR, No M/G/R. No JVD. No thrill. No extra heart sounds. PULM: CTA B, no wheezes, crackles, rhonchi. No  retractions. No resp. distress. No accessory muscle use. ABD: S, NT, ND, +BS. No rebound. No HSM. EXTR: No c/c/e PSYCH: Normally interactive. Conversant.    Assessment and Plan: ***  Signed Lamar Blinks, MD

## 2022-03-21 ENCOUNTER — Ambulatory Visit (INDEPENDENT_AMBULATORY_CARE_PROVIDER_SITE_OTHER): Payer: Medicare Other | Admitting: Physical Therapy

## 2022-03-21 ENCOUNTER — Encounter: Payer: Self-pay | Admitting: Physical Therapy

## 2022-03-21 DIAGNOSIS — M5459 Other low back pain: Secondary | ICD-10-CM

## 2022-03-21 DIAGNOSIS — M25552 Pain in left hip: Secondary | ICD-10-CM

## 2022-03-21 DIAGNOSIS — G8929 Other chronic pain: Secondary | ICD-10-CM | POA: Diagnosis not present

## 2022-03-21 DIAGNOSIS — M6281 Muscle weakness (generalized): Secondary | ICD-10-CM | POA: Diagnosis not present

## 2022-03-21 DIAGNOSIS — R262 Difficulty in walking, not elsewhere classified: Secondary | ICD-10-CM

## 2022-03-21 DIAGNOSIS — M25561 Pain in right knee: Secondary | ICD-10-CM

## 2022-03-21 NOTE — Therapy (Signed)
OUTPATIENT PHYSICAL THERAPY TREATMENT   Patient Name: Jonathan Garrison MRN: [id number] DOB:12-03-1945, 77 y.o., male Today's Date: 03/21/2022  END OF SESSION:  PT End of Session - 03/21/22 1402     Visit Number 4    Number of Visits 6    Date for PT Re-Evaluation 05/02/22    Authorization Type MCR    Progress Note Due on Visit 10    PT Start Time [id number]    PT Stop Time 1430    PT Time Calculation (min) 45 min    Activity Tolerance Patient tolerated treatment well    Behavior During Therapy WFL for tasks assessed/performed              Past Medical History:  Diagnosis Date   ADD (attention deficit disorder) without hyperactivity    Allergy    Anemia    no treatment   Anxiety    Arthralgia    many joints, managed with vitamins/herbs and ibuprofen   Arthritis    Cataract    Chest pain    Colitis 1986   single attack (believes was ulcerative)   Depression    Eczema    Esophageal stricture    GERD (gastroesophageal reflux disease)    Hemorrhoids    Lichen planus    Neuromuscular disorder ([place])    POLIO AGE 21   Oral cancer ([place]) 05/2010   plans for surgery 06/2010   Osteoporosis    hips   Pleurisy 1984   Polio age 85   Umbilical hernia    Past Surgical History:  Procedure Laterality Date   COLONOSCOPY  2012   LEG SURGERY  when in 6th grade   L leg, bone spur removal (just above knee, laterally)   TONGUE SURGERY     for tongue cancer   UPPER GASTROINTESTINAL ENDOSCOPY     Patient Active Problem List   Diagnosis Date Noted   Essential hypertension 02/22/2022   Family history of heart disease 02/22/2022   Increased prostate specific antigen (PSA) velocity 11/27/2017   Osteopenia 08/01/2017   Vitamin B12 deficiency anemia 06/09/2014   Chest pain 03/13/2013   Depression with anxiety 08/26/2011   Overweight (BMI 25.0-29.9) 08/26/2011   Joint pain 08/26/2011   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   Oral cancer ([place]) 07/09/2010    PCP: Darreld Mclean, MD   REFERRING PROVIDER: Vanetta Mulders, MD  REFERRING DIAG: M53.3,G89.29 (ICD-10-CM) - Chronic left SI joint pain. Rt knee pain  Rationale for Evaluation and Treatment: Rehabilitation  THERAPY DIAG:  Other low back pain  Pain in left hip  Chronic pain of right knee  Muscle weakness (generalized)  Difficulty in walking, not elsewhere classified  ONSET DATE: chronic pain for years  SUBJECTIVE:  SUBJECTIVE STATEMENT: He reports he still does the exercises, his Rt knee is feeling better. He saw Dr. Ernestina Patches in regards to his back pain.   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 Yes: NPRS scale: right knee 4-5/10 Pain location: Rt knee and left SIJ Pain description: ache Aggravating factors: stairs, worse first thing in the morning Relieving factors: meloxicam, voltaren  PRECAUTIONS: None  WEIGHT BEARING RESTRICTIONS: No  FALLS:  Has patient fallen in last 6 months? No  OCCUPATION: retired  PLOF: Independent  PATIENT GOALS: reduce pain,   NEXT MD VISIT:   OBJECTIVE:   DIAGNOSTIC FINDINGS:  Xray (right knee 4 views, left knee 4 views): Right knee with very mild degenerative findings particularly about the medial joint space  PATIENT SURVEYS:  Eval: FOTO 67% functional   COGNITION: Overall cognitive status: Within functional limits for tasks assessed     SENSATION: WFL  MUSCLE LENGTH: Hamstrings: mildly tight at 80 deg bilat ITB tightness mildly on Rt  POSTURE:   PALPATION: Tender to palpation over Lt SIJ  LUMBAR ROM:   AROM eval  Flexion WNL  Extension WNL  Right lateral flexion WNL  Left lateral flexion WNL  Right rotation WNL  Left rotation WNL   (Blank rows = not tested)  LOWER EXTREMITY ROM:     Active   Right eval Left eval  Hip flexion [facility]  Hip extension    Hip abduction [facility] [place]  Hip adduction    Hip internal rotation    Hip external rotation    Knee flexion [facility] WFL  Knee extension [facility] WFL  Ankle dorsiflexion    Ankle plantarflexion    Ankle inversion    Ankle eversion     (Blank rows = not tested)  LOWER EXTREMITY MMT:    MMT Right eval Left eval  Hip flexion 4+ 4+  Hip extension    Hip abduction 4+ 4+  Hip adduction    Hip internal rotation    Hip external rotation    Knee flexion 5 5  Knee extension 5- 5-  Ankle dorsiflexion 4 5  Ankle plantarflexion    Ankle inversion    Ankle eversion     (Blank rows = not tested)  LUMBAR SPECIAL TESTS:  Eval: Straight leg raise test: Negative and Slump test: Negative Negative SIJ testing No pain with PAM testing to lumbar spine  FUNCTIONAL TESTS:  Eval: SLS X 10 seconds on left, 3-4 seconds on Rt  GAIT: Eval: Comments: independent community Ambulator, 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  TODAY'S TREATMENT:  03/21/22 -Recumbent bike level 8 x 8 min, cool down level 4 x70mn -Sit to stands, no UE support X 12 reps -Seated lumbar flexion stretch with Pball 6 sec X 12 -Seated hip flexion stretch with pball 6 sec X 12 -Tandem walk in bars without UE support 3 round trips -Rocker board 1 min A-P, 1 min lateral -Supine bridges 2 X 12 -Supine SLR #3 ankle weight, X12 reps bilat -Sidelying straight leg hip abd #3 ankle weight, x12 reps bilat -Prone straight leg hip extension #3 ankle weight, x12 reps bilat   03/07/22 -Recumbent bike level 6-8 x628m, cool down level 4 x2m93m-Supine SLR #2 ankle weight, 2X12 reps bilat -Supine DLR #2, 2x6 -Sidelying straight leg hip abd #2 ankle weight, 2x12 reps bilat -Prone straight leg hip extension #2 ankle weight, 2x12 reps bilat -Quadriped bird dogs 2x6 bilat -Blazepods in longsitting with two pods  on each side tapping with soccer ball 30 sec on, 30 sec rest x3 rounds (right leg up for  first round, left leg up for second round,both legs up third round) (to simulate kayaking) -Standing ITB stretch with one UE support x30 sec bilat  -Supine SLR crossover ITB stretch x30 sec bilat -Hooklying piriformis stretch x30 sec bilat  Neuro Re-ed -Tandem walk without UE support 3 round with supervision -Sidestepping on foam beam without UE support in bars 3 round trips with supervision -Blazepods SLS with four pods 30 sec on, 30 sec rest x2 bilat with supervision    02/21/22 -Supine SLR X 12 reps bilat -Sidelying straight leg hip abd X 12 reps bilat -Prone straight leg hip extension X 12 reps bilat -Supine piriformis stretch 30 sec X 2 bilat -Supine butterfly stretch for hip adductors 30 sec X 2 -Quadriped bird dogs X 12 bilat -Sit to stands, no UE support with slow eccentric lower X 12 reps Neuro Re-ed -SLS 15 sec X 3 bilat -Tandem walk without UE support 6 round trips in bars -sidestepping on foam beam without UE support in bars 6 round trips  Manual therapy: Bilat hip PROM, long axis distraction, manual stretching for hamstrings and ITBand    PATIENT EDUCATION: Education details: HEP, PT plan of care Person educated: Patient Education method: Explanation, Demonstration, Verbal cues, and Handouts Education comprehension: verbalized understanding and needs further education   HOME EXERCISE PROGRAM: Access Code: [email or web address] URL: [email or web address] Date: 02/21/2022 Prepared by: Elsie Ra, progressed on 02/21/22  Exercises - Single Leg Bridge  - 2 x daily - 6 x weekly - 1-2 sets - 10 reps - 5 hold - Seated Hamstring Stretch  - 2 x daily - 6 x weekly - 1 sets - 2 reps - 30 hold - Standing ITB Stretch  - 2 x daily - 6 x weekly - 2-3 sets - 30 hold - Heel Toe Raises with Counter Support  - 2 x daily - 6 x weekly - 2-3 sets - 10 reps - Single Leg Stance  - 2 x daily - 6 x weekly - 1 sets - 5 reps - 10 sec hold - Side Stepping with Resistance at Ankles  -  2 x daily - 6 x weekly - 2-3 sets - 10 reps - Sit to Stand Without Arm Support  - 2 x daily - 6 x weekly - 1-2 sets - 10 reps - Supine Active Straight Leg Raise  - 2 x daily - 6 x weekly - 1-2 sets - 12 reps - Sidelying Hip Abduction (Both Sides)  - 2 x daily - 6 x weekly - 1-2 sets - 12 reps - Prone Hip Extension  - 2 x daily - 6 x weekly - 1-2 sets - 12 reps - Bird Dog  - 2 x daily - 6 x weekly - 1 sets - 12 reps - 2 sec hold - Supine Piriformis Stretch with Foot on Ground  - 2 x daily - 6 x weekly - 2 sets - 30 hold - Tandem Walking  - 2 x daily - 6 x weekly - 3 sets - 12 reps  ASSESSMENT:  CLINICAL IMPRESSION: He is showing some improvement in strength and pain but still with some limitations in these and we continue to work to improve these as tolerated with PT. He showed improvments in balance today with higher level balance challenges.  OBJECTIVE IMPAIRMENTS: decreased activity tolerance, difficulty walking, decreased balance, decreased endurance, decreased mobility, decreased ROM,  decreased strength, impaired flexibility, impaired LE use, postural dysfunction, and pain.  ACTIVITY LIMITATIONS: bending, lifting, carry, locomotion, cleaning, community activity,  PERSONAL FACTORS: childhood polio, OA are also affecting patient's functional outcome.  REHAB POTENTIAL: Good  CLINICAL DECISION MAKING: Stable/uncomplicated  EVALUATION COMPLEXITY: Low    GOALS: Short term PT Goals Target date: 03/07/2022   Pt will be I and compliant with HEP. Baseline:  Goal status: MET 03/07/22 Pt will decrease pain by 25% overall Baseline: Goal status: MET 03/07/22  Long term PT goals Target date:05/02/2022   Pt will improve  hip/knee strength to at least 5-/5 MMT to improve functional strength Baseline: Goal status: New Pt will improve FOTO to at least 69% functional to show improved function Baseline: Goal status: New Pt will reduce pain to overall less than 2-3/10 with usual activity and  kayaking Baseline: Goal status: New Pt will be able to hold SLS X 10 seconds on avg for his Rt leg to show improved balance.  Baseline: 3-4 seconds on Rt Goal status  PLAN: PT FREQUENCY: 1 times per 2 weeks  PT DURATION: 12 weeks  PLANNED INTERVENTIONS (unless contraindicated): aquatic PT, Canalith repositioning, cryotherapy, Electrical stimulation, Iontophoresis with 4 mg/ml dexamethasome, Moist heat, traction, Ultrasound, gait training, Therapeutic exercise, balance training, neuromuscular re-education, patient/family education, prosthetic training, manual techniques, passive ROM, dry needling, taping, vasopnuematic device, vestibular, spinal manipulations, joint manipulations  PLAN FOR NEXT SESSION: review HEP, quad strength, ITB stretching, Hip/SIJ stabilization work, balance challenges.   Debbe Odea, PT,DPT 03/21/2022, 2:16 PM

## 2022-03-22 ENCOUNTER — Ambulatory Visit: Payer: Medicare Other | Attending: Cardiology | Admitting: Pharmacist

## 2022-03-22 ENCOUNTER — Encounter: Payer: Self-pay | Admitting: Pharmacist

## 2022-03-22 ENCOUNTER — Encounter: Payer: Self-pay | Admitting: Family Medicine

## 2022-03-22 ENCOUNTER — Encounter: Payer: Self-pay | Admitting: Internal Medicine

## 2022-03-22 VITALS — BP 142/77 | HR 78

## 2022-03-22 DIAGNOSIS — I2510 Atherosclerotic heart disease of native coronary artery without angina pectoris: Secondary | ICD-10-CM | POA: Diagnosis not present

## 2022-03-22 DIAGNOSIS — G8929 Other chronic pain: Secondary | ICD-10-CM | POA: Insufficient documentation

## 2022-03-22 DIAGNOSIS — I2584 Coronary atherosclerosis due to calcified coronary lesion: Secondary | ICD-10-CM

## 2022-03-22 DIAGNOSIS — M545 Low back pain, unspecified: Secondary | ICD-10-CM | POA: Insufficient documentation

## 2022-03-22 DIAGNOSIS — M7630 Iliotibial band syndrome, unspecified leg: Secondary | ICD-10-CM | POA: Insufficient documentation

## 2022-03-22 DIAGNOSIS — I10 Essential (primary) hypertension: Secondary | ICD-10-CM | POA: Diagnosis not present

## 2022-03-22 DIAGNOSIS — M19011 Primary osteoarthritis, right shoulder: Secondary | ICD-10-CM | POA: Insufficient documentation

## 2022-03-22 MED ORDER — LOSARTAN POTASSIUM 50 MG PO TABS: 75.0000 mg | ORAL_TABLET | Freq: Every day | ORAL | 2 refills | Status: DC

## 2022-03-22 NOTE — Progress Notes (Signed)
Patient ID: Jonathan Garrison                 DOB: 11-04-1945                      MRN: [id number]     HPI: Jonathan Garrison is a 77 y.o. male referred by Dr. Gwenlyn Garrison to HTN clinic. PMH is significant for HTN and knee/joint pain. Had polio as a child. PCP Dr Jonathan Garrison started patient on losartan '25mg'$  which was then further increased to '50mg'$  by Dr Jonathan Garrison.  Patient presents today in good spirits. Is physically active and goes rafting frequently. 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 Is reporting muscle pain in lower extremities but is not sure if it is due to atorvastatin or underlying knee pain and exercising at PT. Patient would like to start Co q 10.  Lives alone and frequently eats out for dinner. For breakfast has cereal and fruit, lunch has peanut butter, fruit, and protein bars, and dinner varies depending where he goes. If he eats at home he may have a can of soup.  Has not had any adverse effects to losartan.  Home BP readings: 137/84 136/76 140/87 139/84 147/83 144/83 138/80  Denies tobacco use.  Has annual visit with Dr Jonathan Garrison tomorrow.  Current HTN meds:  Losartan '50mg'$  daily  BP goal: <130/80   Wt Readings from Last 3 Encounters:  03/11/22 200 lb (90.7 kg)  02/22/22 200 lb 9.6 oz (91 kg)  01/04/22 198 lb 6.4 oz (90 kg)   BP Readings from Last 3 Encounters:  03/22/22 (!) 142/77  02/22/22 (!) 152/86  01/04/22 (!) 146/80   Pulse Readings from Last 3 Encounters:  03/22/22 78  02/22/22 76  01/04/22 65    Renal function: CrCl cannot be calculated (Patient's most recent lab result is older than the maximum 21 days allowed.).  Past Medical History:  Diagnosis Date   ADD (attention deficit disorder) without hyperactivity    Allergy    Anemia    no treatment   Anxiety    Arthralgia    many joints, managed with vitamins/herbs and ibuprofen   Arthritis    Cataract    Chest pain    Colitis 1986   single attack (believes was ulcerative)   Depression    Eczema     Esophageal stricture    GERD (gastroesophageal reflux disease)    Hemorrhoids    Lichen planus    Neuromuscular disorder ([place])    POLIO AGE 72   Oral cancer ([place]) 05/2010   plans for surgery 06/2010   Osteoporosis    hips   Pleurisy 1984   Polio age 53   Umbilical hernia     Current Outpatient Medications on File Prior to Visit  Medication Sig Dispense Refill   amphetamine-dextroamphetamine (ADDERALL) 20 MG tablet Take 1 tablet (20 mg total) by mouth 2 (two) times daily. 60 tablet 0   aspirin 81 MG tablet Take 81 mg by mouth daily.     atorvastatin (LIPITOR) 40 MG tablet Take 1 tablet (40 mg total) by mouth daily. 90 tablet 3   b complex vitamins tablet Take 0.5 tablets by mouth daily.     Biotin 5000 MCG CAPS Take 1 capsule by mouth daily.     Calcium-Magnesium-Zinc [id number] MG TABS Take by mouth. 1 tablet daily     Cholecalciferol (VITAMIN D3) 2000 UNITS capsule Take 2,000 Units by mouth daily.     ferrous sulfate  324 (65 Fe) MG TBEC Take one every other day for low iron 45 tablet 3   Fluorouracil (TOLAK) 4 % CREA Total of 28 applications to the area 40 g 0   fluticasone (FLONASE) 50 MCG/ACT nasal spray SHAKE LIQUID AND USE 2 SPRAYS IN EACH NOSTRIL DAILY 16 g 11   glucosamine-chondroitin 500-400 MG tablet Take 1 tablet by mouth 2 (two) times daily.     losartan (COZAAR) 50 MG tablet Take 1 tablet (50 mg total) by mouth daily. 90 tablet 3   meloxicam (MOBIC) 15 MG tablet TAKE 1 TABLET(15 MG) BY MOUTH DAILY AS NEEDED 90 tablet 2   Multiple Vitamins-Minerals (MULTIVITAMIN WITH MINERALS) tablet Take 1 tablet by mouth daily.     NON FORMULARY ALJ -herbal decongestant  Takes as needed     omeprazole (PRILOSEC) 40 MG capsule Take 1 capsule (40 mg total) by mouth in the morning and at bedtime. 1803 capsule 3   sildenafil (REVATIO) 20 MG tablet Take 2-3 tablets one hour prior to intercourse 60 tablet 5   tacrolimus (PROTOPIC) 0.1 % ointment Apply topically daily. 100 g 2   Zn-Pyg  Afri-Nettle-Saw Palmet (SAW PALMETTO COMPLEX PO) Take 1 tablet by mouth 2 (two) times daily.     No current facility-administered medications on file prior to visit.    No Known Allergies   Assessment/Plan:  1. Hypertension -  HYPERTENSION CONTROL Vitals:   03/22/22 1340 03/22/22 1341  BP: (!) 147/80 (!) 142/77    The patient's blood pressure is elevated above target today.  In order to address the patient's elevated BP: A current anti-hypertensive medication was adjusted today.    Patient BP in room 142/77 which is improved from previous visits but remains above goal of <130/80. Sodium intake likely contributory because he frequently eats out for dinner and when he does not he will have canned soup and crackers. Recommended to decrease sodium intake and gave Salty 6 list.  Breakfast and lunch meals seem heart healthy.  Advised to continue exercise routine. Muscle does not seem to be due to statin use. Advised he can try Co q 10.  Since BP improving on losartan but not quite to goal, will increase to '75mg'$  once daily and have patient continue to monitor.  Recheck in clinic in 2 months.  Increase losartan to '75mg'$  once a day Recheck in 4 weeks  Karren Cobble, PharmD, [place], [place], [place], [place], [place], [address] Phone: [phone], Fax: [phone]

## 2022-03-22 NOTE — Patient Instructions (Addendum)
It was nice meeting you today  We would like your blood pressure to be less than 130/80  Try to watch your salt intake   Let's increase your losartan to '75mg'$  once a day. You can take 1 and 1/2 of your '50mg'$  tablets  Please continue to monitor your blood pressure at home and let us know if it is trending upwards  Please call or message with any questions  Karren Cobble, PharmD, [place], [place], [place], [place], [place], [address] Phone: [phone], Fax: [phone]

## 2022-03-23 ENCOUNTER — Encounter: Payer: Self-pay | Admitting: Family Medicine

## 2022-03-23 ENCOUNTER — Ambulatory Visit (INDEPENDENT_AMBULATORY_CARE_PROVIDER_SITE_OTHER): Payer: Medicare Other | Admitting: Family Medicine

## 2022-03-23 VITALS — BP 132/80 | HR 78 | Temp 97.8°F | Resp 18 | Ht 74.0 in | Wt 201.4 lb

## 2022-03-23 DIAGNOSIS — E611 Iron deficiency: Secondary | ICD-10-CM | POA: Diagnosis not present

## 2022-03-23 DIAGNOSIS — M25569 Pain in unspecified knee: Secondary | ICD-10-CM

## 2022-03-23 DIAGNOSIS — F988 Other specified behavioral and emotional disorders with onset usually occurring in childhood and adolescence: Secondary | ICD-10-CM | POA: Diagnosis not present

## 2022-03-23 DIAGNOSIS — Z125 Encounter for screening for malignant neoplasm of prostate: Secondary | ICD-10-CM

## 2022-03-23 DIAGNOSIS — I2584 Coronary atherosclerosis due to calcified coronary lesion: Secondary | ICD-10-CM

## 2022-03-23 DIAGNOSIS — R7309 Other abnormal glucose: Secondary | ICD-10-CM

## 2022-03-23 DIAGNOSIS — G8929 Other chronic pain: Secondary | ICD-10-CM

## 2022-03-23 DIAGNOSIS — E785 Hyperlipidemia, unspecified: Secondary | ICD-10-CM | POA: Diagnosis not present

## 2022-03-23 DIAGNOSIS — D649 Anemia, unspecified: Secondary | ICD-10-CM

## 2022-03-23 DIAGNOSIS — I10 Essential (primary) hypertension: Secondary | ICD-10-CM | POA: Diagnosis not present

## 2022-03-23 DIAGNOSIS — I2510 Atherosclerotic heart disease of native coronary artery without angina pectoris: Secondary | ICD-10-CM

## 2022-03-23 LAB — LIPID PANEL
Cholesterol: 149 mg/dL (ref 0–200)
HDL: 79.7 mg/dL (ref >39.00)
LDL Cholesterol: 62 mg/dL (ref 0–99)
NonHDL: 69.02
Total CHOL/HDL Ratio: 2
Triglycerides: 34 mg/dL (ref 0.0–149.0)
VLDL: 6.8 mg/dL (ref 0.0–40.0)

## 2022-03-23 LAB — COMPREHENSIVE METABOLIC PANEL
ALT: 24 U/L (ref 0–53)
AST: 29 U/L (ref 0–37)
Albumin: 3.9 g/dL (ref 3.5–5.2)
Alkaline Phosphatase: 61 U/L (ref 39–117)
BUN: 25 mg/dL — ABNORMAL HIGH (ref 6–23)
CO2: 30 mEq/L (ref 19–32)
Calcium: 9.5 mg/dL (ref 8.4–10.5)
Chloride: 104 mEq/L (ref 96–112)
Creatinine, Ser: 0.88 mg/dL (ref 0.40–1.50)
GFR: 84.04 mL/min (ref >60.00)
Glucose, Bld: 83 mg/dL (ref 70–99)
Potassium: 4.4 mEq/L (ref 3.5–5.1)
Sodium: 140 mEq/L (ref 135–145)
Total Bilirubin: 0.6 mg/dL (ref 0.2–1.2)
Total Protein: 6.3 g/dL (ref 6.0–8.3)

## 2022-03-23 LAB — CBC
HCT: 40.6 % (ref 39.0–52.0)
Hemoglobin: 13.6 g/dL (ref 13.0–17.0)
MCHC: 33.4 g/dL (ref 30.0–36.0)
MCV: 93.2 fl (ref 78.0–100.0)
Platelets: 263 10*3/uL (ref 150.0–400.0)
RBC: 4.36 Mil/uL (ref 4.22–5.81)
RDW: 14 % (ref 11.5–15.5)
WBC: 6.2 10*3/uL (ref 4.0–10.5)

## 2022-03-23 LAB — HEMOGLOBIN A1C: Hgb A1c MFr Bld: 5.5 % (ref 4.6–6.5)

## 2022-03-23 LAB — FERRITIN: Ferritin: 60.3 ng/mL (ref 22.0–322.0)

## 2022-03-23 LAB — PSA, MEDICARE: PSA: 1.4 ng/ml (ref 0.10–4.00)

## 2022-03-24 ENCOUNTER — Other Ambulatory Visit: Payer: Self-pay | Admitting: Family Medicine

## 2022-03-24 ENCOUNTER — Ambulatory Visit: Payer: Medicare Other | Admitting: Physical Medicine and Rehabilitation

## 2022-03-24 DIAGNOSIS — R972 Elevated prostate specific antigen [PSA]: Secondary | ICD-10-CM

## 2022-03-24 DIAGNOSIS — I10 Essential (primary) hypertension: Secondary | ICD-10-CM

## 2022-04-06 DIAGNOSIS — X501XXA Overexertion from prolonged static or awkward postures, initial encounter: Secondary | ICD-10-CM | POA: Diagnosis not present

## 2022-04-06 DIAGNOSIS — S43401A Unspecified sprain of right shoulder joint, initial encounter: Secondary | ICD-10-CM | POA: Diagnosis not present

## 2022-04-07 DIAGNOSIS — F422 Mixed obsessional thoughts and acts: Secondary | ICD-10-CM | POA: Diagnosis not present

## 2022-04-11 ENCOUNTER — Encounter: Payer: Self-pay | Admitting: Physical Therapy

## 2022-04-11 ENCOUNTER — Ambulatory Visit (INDEPENDENT_AMBULATORY_CARE_PROVIDER_SITE_OTHER): Payer: Medicare Other | Admitting: Physical Therapy

## 2022-04-11 DIAGNOSIS — M25561 Pain in right knee: Secondary | ICD-10-CM | POA: Diagnosis not present

## 2022-04-11 DIAGNOSIS — G8929 Other chronic pain: Secondary | ICD-10-CM | POA: Diagnosis not present

## 2022-04-11 DIAGNOSIS — M6281 Muscle weakness (generalized): Secondary | ICD-10-CM

## 2022-04-11 DIAGNOSIS — M5459 Other low back pain: Secondary | ICD-10-CM

## 2022-04-11 DIAGNOSIS — M25552 Pain in left hip: Secondary | ICD-10-CM | POA: Diagnosis not present

## 2022-04-11 DIAGNOSIS — R262 Difficulty in walking, not elsewhere classified: Secondary | ICD-10-CM

## 2022-04-11 NOTE — Therapy (Signed)
OUTPATIENT PHYSICAL THERAPY TREATMENT   Patient Name: Jonathan Garrison MRN: [id number] DOB:April 17, 1945, 77 y.o., male Today's Date: 04/11/2022  END OF SESSION:  PT End of Session - 04/11/22 1356     Visit Number 5    Number of Visits 6    Date for PT Re-Evaluation 05/02/22    Authorization Type MCR    Progress Note Due on Visit 10    PT Start Time [id number]    PT Stop Time 1430    PT Time Calculation (min) 45 min    Activity Tolerance Patient tolerated treatment well    Behavior During Therapy WFL for tasks assessed/performed              Past Medical History:  Diagnosis Date   ADD (attention deficit disorder) without hyperactivity    Allergy    Anemia    no treatment   Anxiety    Arthralgia    many joints, managed with vitamins/herbs and ibuprofen   Arthritis    Cataract    Chest pain    Colitis 1986   single attack (believes was ulcerative)   Depression    Eczema    Esophageal stricture    GERD (gastroesophageal reflux disease)    Hemorrhoids    Lichen planus    Neuromuscular disorder ([place])    POLIO AGE 76   Oral cancer ([place]) 05/2010   plans for surgery 06/2010   Osteoporosis    hips   Pleurisy 1984   Polio age 46   Umbilical hernia    Past Surgical History:  Procedure Laterality Date   COLONOSCOPY  2012   LEG SURGERY  when in 6th grade   L leg, bone spur removal (just above knee, laterally)   TONGUE SURGERY     for tongue cancer   UPPER GASTROINTESTINAL ENDOSCOPY     Patient Active Problem List   Diagnosis Date Noted   Localized osteoarthritis of both shoulders 03/22/2022   Lower back pain 03/22/2022   Neck pain, chronic 03/22/2022   IT band syndrome 03/22/2022   Essential hypertension 02/22/2022   Family history of heart disease 02/22/2022   Trochanteric bursitis of left hip 08/01/2021   Pain of both hip joints 07/20/2021   Seasonal allergic rhinitis 05/24/2021   ETD (Eustachian tube dysfunction), bilateral 05/24/2021   Gastroesophageal reflux  disease without esophagitis 12/15/2017   Dysphagia 12/15/2017   History of oral cancer 12/15/2017   Increased prostate specific antigen (PSA) velocity 11/27/2017   Osteopenia 08/01/2017   Vitamin B12 deficiency anemia 06/09/2014   Chest pain 03/13/2013   Depression with anxiety 08/26/2011   Overweight (BMI 25.0-29.9) 08/26/2011   Joint pain 08/26/2011   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   Oral cancer ([place]) 07/09/2010    PCP: Darreld Mclean, MD   REFERRING PROVIDER: Vanetta Mulders, MD  REFERRING DIAG: M53.3,G89.29 (ICD-10-CM) - Chronic left SI joint pain. Rt knee pain  Rationale for Evaluation and Treatment: Rehabilitation  THERAPY DIAG:  Other low back pain  Pain in left hip  Chronic pain of right knee  Muscle weakness (generalized)  Difficulty in walking, not elsewhere classified  ONSET DATE: chronic pain for years  SUBJECTIVE:  SUBJECTIVE STATEMENT: He relays he strained his shoulder kayacking since he was in PT last. He will see MD about an injection in his left hip soon. His Rt knee pain is about 6/10 overall  PERTINENT HISTORY:  Polio as child, OA  PAIN:  Are you having pain? Yes: NPRS scale: 6/10 Pain location: Rt knee and left SIJ Pain description: ache Aggravating factors: stairs, worse first thing in the morning Relieving factors: meloxicam, voltaren  PRECAUTIONS: None  WEIGHT BEARING RESTRICTIONS: No  FALLS:  Has patient fallen in last 6 months? No  OCCUPATION: retired  PLOF: Independent  PATIENT GOALS: reduce pain,   NEXT MD VISIT:   OBJECTIVE:   DIAGNOSTIC FINDINGS:  Xray (right knee 4 views, left knee 4 views): Right knee with very mild degenerative findings particularly about the medial joint space  PATIENT SURVEYS:  Eval: FOTO 67%  functional   COGNITION: Overall cognitive status: Within functional limits for tasks assessed     SENSATION: WFL  MUSCLE LENGTH: Hamstrings: mildly tight at 80 deg bilat ITB tightness mildly on Rt  POSTURE:   PALPATION: Tender to palpation over Lt SIJ  LUMBAR ROM:   AROM eval  Flexion WNL  Extension WNL  Right lateral flexion WNL  Left lateral flexion WNL  Right rotation WNL  Left rotation WNL   (Blank rows = not tested)  LOWER EXTREMITY ROM:     Active  Right eval Left eval  Hip flexion [facility]  Hip extension    Hip abduction [facility]  Hip adduction    Hip internal rotation    Hip external rotation    Knee flexion [facility] WFL  Knee extension [facility] WFL  Ankle dorsiflexion    Ankle plantarflexion    Ankle inversion    Ankle eversion     (Blank rows = not tested)  LOWER EXTREMITY MMT:    MMT Right eval Left eval   Hip flexion 4+ 4+   Hip extension     Hip abduction 4+ 4+   Hip adduction     Hip internal rotation     Hip external rotation     Knee flexion 5 5   Knee extension 5- 5-   Ankle dorsiflexion 4 5   Ankle plantarflexion     Ankle inversion     Ankle eversion      (Blank rows = not tested)  LUMBAR SPECIAL TESTS:  Eval: Straight leg raise test: Negative and Slump test: Negative Negative SIJ testing No pain with PAM testing to lumbar spine  FUNCTIONAL TESTS:  Eval: SLS X 10 seconds on left, 3-4 seconds on Rt  GAIT: Eval: Comments: independent community Ambulator, 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  TODAY'S TREATMENT:  04/11/22 -Recumbent bike level 8 x 8 min, cool down level 4 x16min -Sit to stands, no UE support X 12 reps holding 5# -Seated lumbar flexion stretch with Pball 6 sec X 12 -Step ups on 6 inch step holding 5# and one UE support X 12 reps bilat, then 6 extra reps on the Rt leg -Standing hip abd with green X 12 reps bilat, then extra 6 reps on Rt -Standing hip ext with green X 12 reps bilat, then extra 6 reps on Rt -Standing hip  flexion with green X 12 reps bilat, then extra 6 reps on Rt -Leg press DL 100# 2X12, then SL 50# X 12 bilat and one extra set of 6 reps for Rt -Tandem walk in bars on foam without UE support  6 round trips -sidestepping in bars on foam without UE support 6 round trips -Rocker board 1 min A-P, 1 min lateral   03/21/22 -Recumbent bike level 8 x 8 min, cool down level 4 x66min -Sit to stands, no UE support X 12 reps -Seated lumbar flexion stretch with Pball 6 sec X 12 -Seated hip flexion stretch with pball 6 sec X 12 -Tandem walk in bars without UE support 3 round trips -Rocker board 1 min A-P, 1 min lateral -Supine bridges 2 X 12 -Supine SLR #3 ankle weight, X12 reps bilat -Sidelying straight leg hip abd #3 ankle weight, x12 reps bilat -Prone straight leg hip extension #3 ankle weight, x12 reps bilat   PATIENT EDUCATION: Education details: HEP, PT plan of care Person educated: Patient Education method: Explanation, Demonstration, Verbal cues, and Handouts Education comprehension: verbalized understanding and needs further education   HOME EXERCISE PROGRAM: Access Code: [email or web address] URL: [email or web address] Date: 02/21/2022 Prepared by: Elsie Ra, progressed on 02/21/22  Exercises - Single Leg Bridge  - 2 x daily - 6 x weekly - 1-2 sets - 10 reps - 5 hold - Seated Hamstring Stretch  - 2 x daily - 6 x weekly - 1 sets - 2 reps - 30 hold - Standing ITB Stretch  - 2 x daily - 6 x weekly - 2-3 sets - 30 hold - Heel Toe Raises with Counter Support  - 2 x daily - 6 x weekly - 2-3 sets - 10 reps - Single Leg Stance  - 2 x daily - 6 x weekly - 1 sets - 5 reps - 10 sec hold - Side Stepping with Resistance at Ankles  - 2 x daily - 6 x weekly - 2-3 sets - 10 reps - Sit to Stand Without Arm Support  - 2 x daily - 6 x weekly - 1-2 sets - 10 reps - Supine Active Straight Leg Raise  - 2 x daily - 6 x weekly - 1-2 sets - 12 reps - Sidelying Hip Abduction (Both Sides)  - 2 x daily - 6  x weekly - 1-2 sets - 12 reps - Prone Hip Extension  - 2 x daily - 6 x weekly - 1-2 sets - 12 reps - Bird Dog  - 2 x daily - 6 x weekly - 1 sets - 12 reps - 2 sec hold - Supine Piriformis Stretch with Foot on Ground  - 2 x daily - 6 x weekly - 2 sets - 30 hold - Tandem Walking  - 2 x daily - 6 x weekly - 3 sets - 12 reps  ASSESSMENT:  CLINICAL IMPRESSION: His strength is improving but he still has some pain with activity that varies. We will continue to work on building up the strength in his legs as his Rt leg is weaker than Lt. His balance is doing overall much better since starting PT. He has one more visit left on current PT plan so we will assess if he feels ready to DC then  OBJECTIVE IMPAIRMENTS: decreased activity tolerance, difficulty walking, decreased balance, decreased endurance, decreased mobility, decreased ROM, decreased strength, impaired flexibility, impaired LE use, postural dysfunction, and pain.  ACTIVITY LIMITATIONS: bending, lifting, carry, locomotion, cleaning, community activity,  PERSONAL FACTORS: childhood polio, OA are also affecting patient's functional outcome.  REHAB POTENTIAL: Good  CLINICAL DECISION MAKING: Stable/uncomplicated  EVALUATION COMPLEXITY: Low    GOALS: Short term PT Goals Target date: 03/07/2022   Pt will be I and compliant with  HEP. Baseline:  Goal status: MET 03/07/22 Pt will decrease pain by 25% overall Baseline: Goal status: MET 03/07/22  Long term PT goals Target date:05/02/2022   Pt will improve  hip/knee strength to at least 5-/5 MMT to improve functional strength Baseline: Goal status: New Pt will improve FOTO to at least 69% functional to show improved function Baseline: Goal status: New Pt will reduce pain to overall less than 2-3/10 with usual activity and kayaking Baseline: Goal status: New Pt will be able to hold SLS X 10 seconds on avg for his Rt leg to show improved balance.  Baseline: 3-4 seconds on Rt Goal  status  PLAN: PT FREQUENCY: 1 times per 2 weeks  PT DURATION: 12 weeks  PLANNED INTERVENTIONS (unless contraindicated): aquatic PT, Canalith repositioning, cryotherapy, Electrical stimulation, Iontophoresis with 4 mg/ml dexamethasome, Moist heat, traction, Ultrasound, gait training, Therapeutic exercise, balance training, neuromuscular re-education, patient/family education, prosthetic training, manual techniques, passive ROM, dry needling, taping, vasopnuematic device, vestibular, spinal manipulations, joint manipulations  PLAN FOR NEXT SESSION: assess if he is ready to transition to independent program.   Debbe Odea, PT,DPT 04/11/2022, 1:57 PM

## 2022-04-12 ENCOUNTER — Encounter (HOSPITAL_BASED_OUTPATIENT_CLINIC_OR_DEPARTMENT_OTHER): Payer: Self-pay | Admitting: Orthopaedic Surgery

## 2022-04-13 ENCOUNTER — Ambulatory Visit (INDEPENDENT_AMBULATORY_CARE_PROVIDER_SITE_OTHER): Payer: Medicare Other

## 2022-04-13 ENCOUNTER — Ambulatory Visit (INDEPENDENT_AMBULATORY_CARE_PROVIDER_SITE_OTHER): Payer: Medicare Other | Admitting: Orthopaedic Surgery

## 2022-04-13 DIAGNOSIS — M25511 Pain in right shoulder: Secondary | ICD-10-CM

## 2022-04-13 NOTE — Progress Notes (Signed)
Chief Complaint: Right shoulder pain, left hip pain     History of Present Illness:   04/13/2022: Presents today with ongoing right shoulder pain after a kayak broke last 1 week prior where he reached and subsequently felt posterior based shoulder pain particular with cross body adduction.  He states that initially it felt like this was on the top of the shoulder.  This has been improving somewhat.  [facility].  With regard to his left hip he has been experiencing pain about the lateral aspect of the hip.  This has been awakening him at night with side-lying.  He does have a history of IT band symptoms.  He described it as a lateral dull achiness.  He has been doing physical therapy  Jonathan Garrison is a 77 y.o. male presents today with ongoing right knee pain as well as left knee tightness particularly in the morning.  Of note he is very active and previously was an avid basketball player.  He did have to quit last summer as a result of the right knee.  He does have pain about the lateral aspect of the knee at rest.  He was seen by an outside orthopedist who recommended injection for tendinitis.  He does enjoy white water kayaking.  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  He wears a custom insole for this.  Finally he does endorse left-sided SI type back pain for which she has been working on a good stretching program of his back.  He previously worked as a professor at [employer]    Surgical History:   None  PMH/PSH/Family History/Social History/Meds/Allergies:    Past Medical History:  Diagnosis Date   ADD (attention deficit disorder) without hyperactivity    Allergy    Anemia    no treatment   Anxiety    Arthralgia    many joints, managed with vitamins/herbs and ibuprofen   Arthritis    Cataract    Chest pain    Colitis 1986   single attack (believes was ulcerative)   Depression    Eczema    Esophageal  stricture    GERD (gastroesophageal reflux disease)    Hemorrhoids    Lichen planus    Neuromuscular disorder ([place])    POLIO AGE 28   Oral cancer ([place]) 05/2010   plans for surgery 06/2010   Osteoporosis    hips   Pleurisy 1984   Polio age 24   Umbilical hernia    Past Surgical History:  Procedure Laterality Date   COLONOSCOPY  2012   LEG SURGERY  when in 6th grade   L leg, bone spur removal (just above knee, laterally)   TONGUE SURGERY     for tongue cancer   UPPER GASTROINTESTINAL ENDOSCOPY     Social History   Socioeconomic History   Marital status: Single    Spouse name: Not on file   Number of children: Not on file   Years of education: Not on file   Highest education level: Not on file  Occupational History   Occupation: Psychologist, 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)    Employer: [employer]  Tobacco Use   Smoking status: Former    Types: Pipe, Cigarettes    Quit date: 01/25/1976  Years since quitting: 46.2   Smokeless tobacco: Never   Tobacco comments:    Quit smoking 35 years ago  Vaping Use   Vaping Use: Never used  Substance and Sexual Activity   Alcohol use: Yes    Alcohol/week: 7.0 - 14.0 standard drinks of alcohol    Types: 7 - 14 Glasses of wine per week   Drug use: No   Sexual activity: Not on file  Other Topics Concern   Not on file  Social History Narrative   Not on file   Social Determinants of Health   Financial Resource Strain: Low Risk  (01/04/2022)   Overall Financial Resource Strain (CARDIA)    Difficulty of Paying Living Expenses: Not hard at all  Food Insecurity: No Food Insecurity (01/04/2022)   Hunger Vital Sign    Worried About Running Out of Food in the Last Year: Never true    Ran Out of Food in the Last Year: Never true  Transportation Needs: No Transportation Needs (01/04/2022)   PRAPARE - Hydrologist (Medical): No    Lack of Transportation (Non-Medical): No  Physical Activity: Sufficiently Active  (01/04/2022)   Exercise Vital Sign    Days of Exercise per Week: 6 days    Minutes of Exercise per Session: 60 min  Stress: No Stress Concern Present (01/04/2022)   [place]    Feeling of Stress : Only a little  Social Connections: Moderately Isolated (01/04/2022)   Social Connection and Isolation Panel [NHANES]    Frequency of Communication with Friends and Family: Never    Frequency of Social Gatherings with Friends and Family: More than three times a week    Attends Religious Services: Never    Marine scientist or Organizations: Yes    Attends Music therapist: More than 4 times per year    Marital Status: Divorced   Family History  Problem Relation Age of Onset   Hypertension Mother    Diabetes Mother    Hyperlipidemia Mother    Hypertension Father    Benign prostatic hyperplasia Father    Heart disease Father    Lichen planus Sister    Cancer Sister        ?spot on chest? contained   Diabetes Maternal Grandfather    Colon cancer Neg Hx    Esophageal cancer Neg Hx    Stomach cancer Neg Hx    Pancreatic cancer Neg Hx    Liver disease Neg Hx    Colon polyps Neg Hx    Rectal cancer Neg Hx    No Known Allergies Current Outpatient Medications  Medication Sig Dispense Refill   amphetamine-dextroamphetamine (ADDERALL) 20 MG tablet Take 1 tablet (20 mg total) by mouth 2 (two) times daily. 60 tablet 0   aspirin 81 MG tablet Take 81 mg by mouth daily.     atorvastatin (LIPITOR) 40 MG tablet Take 1 tablet (40 mg total) by mouth daily. 90 tablet 3   b complex vitamins tablet Take 0.5 tablets by mouth daily.     Biotin 5000 MCG CAPS Take 1 capsule by mouth daily.     Calcium-Magnesium-Zinc 497-02-6 MG TABS Take by mouth. 1 tablet daily     Cholecalciferol (VITAMIN D3) 2000 UNITS capsule Take 2,000 Units by mouth daily.     ferrous sulfate 324 (65 Fe) MG TBEC Take one every other day for low  iron 45 tablet 3  Fluorouracil (TOLAK) 4 % CREA Total of 28 applications to the area 40 g 0   fluticasone (FLONASE) 50 MCG/ACT nasal spray SHAKE LIQUID AND USE 2 SPRAYS IN EACH NOSTRIL DAILY 16 g 11   glucosamine-chondroitin 500-400 MG tablet Take 1 tablet by mouth 2 (two) times daily.     losartan (COZAAR) 50 MG tablet Take 1.5 tablets (75 mg total) by mouth daily. 45 tablet 2   meloxicam (MOBIC) 15 MG tablet TAKE 1 TABLET(15 MG) BY MOUTH DAILY AS NEEDED 90 tablet 2   Multiple Vitamins-Minerals (MULTIVITAMIN WITH MINERALS) tablet Take 1 tablet by mouth daily.     NON FORMULARY ALJ -herbal decongestant  Takes as needed     omeprazole (PRILOSEC) 40 MG capsule Take 1 capsule (40 mg total) by mouth in the morning and at bedtime. 1803 capsule 3   sildenafil (REVATIO) 20 MG tablet Take 2-3 tablets one hour prior to intercourse 60 tablet 5   Zn-Pyg Afri-Nettle-Saw Palmet (SAW PALMETTO COMPLEX PO) Take 1 tablet by mouth 2 (two) times daily.     No current facility-administered medications for this visit.   DG Shoulder Right  Result Date: 04/13/2022 CLINICAL DATA:  Acute pain. EXAM: RIGHT SHOULDER - 2+ VIEW COMPARISON:  None Available. FINDINGS: Soft tissue calcifications adjacent to the lateral aspect of the humeral head. No fracture or dislocation. No other significant bony or soft tissue abnormalities identified. IMPRESSION: Soft tissue calcifications adjacent to the lateral aspect of the humeral head consistent with mild calcific tendinopathy. Electronically Signed   By: Dorise Bullion III M.D.   On: 04/13/2022 10:11    Review of Systems:   A ROS was performed including pertinent positives and negatives as documented in the HPI.  Physical Exam :   Constitutional: NAD and appears stated age Neurological: Alert and oriented Psych: Appropriate affect and cooperative There were no vitals taken for this visit.   Comprehensive Musculoskeletal Exam:    Tenderness palpation about the lateral  trochanter.  Tenderness with resisted abduction of the left hip.  He has 30 degrees internal/external rotation of the left hip.  Right shoulder with pain radiating laterally about the deltoid.  Positive Neer impingement test.  Full active range of motion with good strength, negative belly press  He is tenderness palpation about the left SI joint.  There is tenderness about the right knee IT band laterally.  No joint line tenderness about the right knee.  Range of motion is from 0 to 130 degrees without crepitus.  He has bilateral cavovarus feet with hammertoes throughout.  Imaging:   Xray (right knee 4 views, left knee 4 views): Right knee with very mild degenerative findings particularly about the medial joint space    I personally reviewed and interpreted the radiographs.   Assessment:   77 y.o. male with right shoulder rotator cuff contusion as well as a left hip evidence of gluteus medius tendinitis.  Side effect I recommended ultrasound-guided injection of the left hip.  He would like to proceed with this today.  I would like him to work in physical therapy on the right shoulder specifically.  He does have some scapular winging which I do believe would benefit from a shoulder strengthening program Plan :    -Return to clinic as needed     I personally saw and evaluated the patient, and participated in the management and treatment plan.  Vanetta Mulders, MD Attending Physician, Orthopedic Surgery  This document was dictated using Dragon voice recognition software. A  reasonable attempt at proof reading has been made to minimize errors.

## 2022-04-13 NOTE — Addendum Note (Signed)
Addended by: Raynelle Fanning A on: 04/13/2022 11:18 AM   Modules accepted: Orders

## 2022-04-25 ENCOUNTER — Encounter: Payer: Medicare Other | Admitting: Physical Therapy

## 2022-04-28 ENCOUNTER — Encounter: Payer: Self-pay | Admitting: Physical Therapy

## 2022-04-28 ENCOUNTER — Ambulatory Visit (INDEPENDENT_AMBULATORY_CARE_PROVIDER_SITE_OTHER): Payer: Medicare Other | Admitting: Physical Therapy

## 2022-04-28 DIAGNOSIS — G8929 Other chronic pain: Secondary | ICD-10-CM

## 2022-04-28 DIAGNOSIS — M5459 Other low back pain: Secondary | ICD-10-CM | POA: Diagnosis not present

## 2022-04-28 DIAGNOSIS — R262 Difficulty in walking, not elsewhere classified: Secondary | ICD-10-CM | POA: Diagnosis not present

## 2022-04-28 DIAGNOSIS — M25561 Pain in right knee: Secondary | ICD-10-CM | POA: Diagnosis not present

## 2022-04-28 DIAGNOSIS — M25511 Pain in right shoulder: Secondary | ICD-10-CM

## 2022-04-28 DIAGNOSIS — M6281 Muscle weakness (generalized): Secondary | ICD-10-CM

## 2022-04-28 DIAGNOSIS — M25552 Pain in left hip: Secondary | ICD-10-CM

## 2022-04-28 NOTE — Therapy (Signed)
OUTPATIENT PHYSICAL THERAPY TREATMENT/RECERT Progress Note reporting period 02/07/22 to 04/28/22  See below for objective and subjective measurements relating to patients progress with PT.    Patient Name: Jonathan Garrison MRN: [id number] DOB:July 25, 1945, 77 y.o., male Today's Date: 04/28/2022  END OF SESSION:  PT End of Session - 04/28/22 1356     Visit Number 6    Number of Visits 12    Date for PT Re-Evaluation 06/09/22    Authorization Type MCR    Progress Note Due on Visit 16    PT Start Time 1345    PT Stop Time 1430    PT Time Calculation (min) 45 min    Activity Tolerance Patient tolerated treatment well    Behavior During Therapy WFL for tasks assessed/performed               Past Medical History:  Diagnosis Date   ADD (attention deficit disorder) without hyperactivity    Allergy    Anemia    no treatment   Anxiety    Arthralgia    many joints, managed with vitamins/herbs and ibuprofen   Arthritis    Cataract    Chest pain    Colitis 1986   single attack (believes was ulcerative)   Depression    Eczema    Esophageal stricture    GERD (gastroesophageal reflux disease)    Hemorrhoids    Lichen planus    Neuromuscular disorder    POLIO AGE 54   Oral cancer 05/2010   plans for surgery 06/2010   Osteoporosis    hips   Pleurisy 1984   Polio age 49   Umbilical hernia    Past Surgical History:  Procedure Laterality Date   COLONOSCOPY  2012   LEG SURGERY  when in 6th grade   L leg, bone spur removal (just above knee, laterally)   TONGUE SURGERY     for tongue cancer   UPPER GASTROINTESTINAL ENDOSCOPY     Patient Active Problem List   Diagnosis Date Noted   Localized osteoarthritis of both shoulders 03/22/2022   Lower back pain 03/22/2022   Neck pain, chronic 03/22/2022   IT band syndrome 03/22/2022   Essential hypertension 02/22/2022   Family history of heart disease 02/22/2022   Trochanteric bursitis of left hip 08/01/2021   Pain of both hip  joints 07/20/2021   Seasonal allergic rhinitis 05/24/2021   ETD (Eustachian tube dysfunction), bilateral 05/24/2021   Gastroesophageal reflux disease without esophagitis 12/15/2017   Dysphagia 12/15/2017   History of oral cancer 12/15/2017   Increased prostate specific antigen (PSA) velocity 11/27/2017   Osteopenia 08/01/2017   Vitamin B12 deficiency anemia 06/09/2014   Chest pain 03/13/2013   Depression with anxiety 08/26/2011   Overweight (BMI 25.0-29.9) 08/26/2011   Joint pain 08/26/2011   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   Oral cancer 07/09/2010    PCP: Darreld Mclean, MD   REFERRING PROVIDER: Vanetta Mulders, MD  REFERRING DIAG: M53.3,G89.29 (ICD-10-CM) - Chronic left SI joint pain. Rt knee pain, Rt shoulder sprain  Rationale for Evaluation and Treatment: Rehabilitation  THERAPY DIAG:  Other low back pain  Pain in left hip  Chronic pain of right knee  Muscle weakness (generalized)  Difficulty in walking, not elsewhere classified  Acute pain of right shoulder  ONSET DATE: chronic pain for years  SUBJECTIVE:  SUBJECTIVE STATEMENT: He relays he has shoulder sprain from kayacking. He saw MD about this who mentions adding PT for Rt shoulder. He had injection in left hip as well  PERTINENT HISTORY:  Polio as child, OA  PAIN:  Are you having pain? Yes: NPRS scale: 4/10 Pain location: Rt knee and left SIJ Pain description: ache Aggravating factors: stairs, worse first thing in the morning Relieving factors: meloxicam, voltaren  PRECAUTIONS: None  WEIGHT BEARING RESTRICTIONS: No  FALLS:  Has patient fallen in last 6 months? No  OCCUPATION: retired  PLOF: Independent  PATIENT GOALS: reduce pain,   NEXT MD VISIT:   OBJECTIVE:   DIAGNOSTIC FINDINGS:  Xray  (right knee 4 views, left knee 4 views): Right knee with very mild degenerative findings particularly about the medial joint space  PATIENT SURVEYS:  Eval: FOTO 67% functional   COGNITION: Overall cognitive status: Within functional limits for tasks assessed     SENSATION: WFL  MUSCLE LENGTH: Hamstrings: mildly tight at 80 deg bilat ITB tightness mildly on Rt  POSTURE:   PALPATION: Tender to palpation over Lt SIJ  LUMBAR ROM:   AROM eval  Flexion WNL  Extension WNL  Right lateral flexion WNL  Left lateral flexion WNL  Right rotation WNL  Left rotation WNL   (Blank rows = not tested)   ROM:     Active  Right eval Left eval Rt/Lt 04/28/22  Hip flexion [facility]   Hip extension     Hip abduction [facility]   Hip adduction     Hip internal rotation     Hip external rotation     Knee flexion [facility] WFL   Knee extension [facility]   Shoulder flexion   150  Shoulder abduction   155              (Blank rows = not tested)   MMT:    MMT Right eval Left eval Rt/Lt 04/28/22  Hip flexion 4+ 4+ 5/5  Hip extension     Hip abduction 4+ 4+ 4+/4+  Hip adduction     Hip internal rotation     Hip external rotation     Knee flexion 5 5 5/5  Knee extension 5- 5- 5/5  Ankle dorsiflexion 4 5 4/5       Shoulder flexion   5/5  Shoulder abduction   4+/5  Shoulder IR   5/5  Shoulder ER   4/4+   (Blank rows = not tested)  LUMBAR SPECIAL TESTS:  Eval: Straight leg raise test: Negative and Slump test: Negative Negative SIJ testing No pain with PAM testing to lumbar spine  FUNCTIONAL TESTS:  Eval: SLS X 10 seconds on left, 3-4 seconds on Rt  GAIT: Eval: Comments: independent community Ambulator, 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  TODAY'S TREATMENT:  04/28/2422 -Sci fit bike L5 UE/LE X 6 min then 2 minute cooldown -Standing shoulder abduction with green band 2X12 bilat -Standing shoulder ER with green band 2X12 bilat -Standing shoulder horizontal abd with green band 2X12  bilat -heel walking in bars and tandem walk 3 round trips with UE support -Leg press DL 100# 2X12, then SL 50# X 12 bilat and one extra set of 6 reps for Rt -updated measurements and goals, see above and below for details   04/11/22 -Recumbent bike level 8 x 8 min, cool down level 4 x50min -Sit to stands, no UE support X 12 reps holding 5# -Seated lumbar flexion stretch with Pball 6  sec X 12 -Step ups on 6 inch step holding 5# and one UE support X 12 reps bilat, then 6 extra reps on the Rt leg -Standing hip abd with green X 12 reps bilat, then extra 6 reps on Rt -Standing hip ext with green X 12 reps bilat, then extra 6 reps on Rt -Standing hip flexion with green X 12 reps bilat, then extra 6 reps on Rt -Leg press DL 100# 2X12, then SL 50# X 12 bilat and one extra set of 6 reps for Rt -Tandem walk in bars on foam without UE support 6 round trips -sidestepping in bars on foam without UE support 6 round trips -Rocker board 1 min A-P, 1 min lateral   03/21/22 -Recumbent bike level 8 x 8 min, cool down level 4 x33min -Sit to stands, no UE support X 12 reps -Seated lumbar flexion stretch with Pball 6 sec X 12 -Seated hip flexion stretch with pball 6 sec X 12 -Tandem walk in bars without UE support 3 round trips -Rocker board 1 min A-P, 1 min lateral -Supine bridges 2 X 12 -Supine SLR #3 ankle weight, X12 reps bilat -Sidelying straight leg hip abd #3 ankle weight, x12 reps bilat -Prone straight leg hip extension #3 ankle weight, x12 reps bilat   PATIENT EDUCATION: Education details: HEP, PT plan of care Person educated: Patient Education method: Explanation, Demonstration, Verbal cues, and Handouts Education comprehension: verbalized understanding and needs further education   HOME EXERCISE PROGRAM: Access Code: [email or web address] URL: [email or web address] Date: 04/28/2022 Prepared by: Elsie Ra  Exercises - Single Leg Bridge  - 2 x daily - 6 x weekly - 1-2 sets - 10 reps  - 5 hold - Seated Hamstring Stretch  - 2 x daily - 6 x weekly - 1 sets - 2 reps - 30 hold - Standing ITB Stretch  - 2 x daily - 6 x weekly - 2-3 sets - 30 hold - Heel Toe Raises with Counter Support  - 2 x daily - 6 x weekly - 2-3 sets - 10 reps - Single Leg Stance  - 2 x daily - 6 x weekly - 1 sets - 5 reps - 10 sec hold - Side Stepping with Resistance at Ankles  - 2 x daily - 6 x weekly - 2-3 sets - 10 reps - Sit to Stand Without Arm Support  - 2 x daily - 6 x weekly - 1-2 sets - 10 reps - Supine Active Straight Leg Raise  - 2 x daily - 6 x weekly - 1-2 sets - 12 reps - Sidelying Hip Abduction (Both Sides)  - 2 x daily - 6 x weekly - 1-2 sets - 12 reps - Prone Hip Extension  - 2 x daily - 6 x weekly - 1-2 sets - 12 reps - Bird Dog  - 2 x daily - 6 x weekly - 1 sets - 12 reps - 2 sec hold - Supine Piriformis Stretch with Foot on Ground  - 2 x daily - 6 x weekly - 2 sets - 30 hold - Tandem Walking  - 2 x daily - 6 x weekly - 3 sets - 12 reps - Shoulder External Rotation with Anchored Resistance  - 2 x daily - 6 x weekly - 2-3 sets - 12 reps - Shoulder External Rotation and Scapular Retraction with Resistance  - 2 x daily - 6 x weekly - 2-3 sets - 12 reps - Standing Shoulder Horizontal Abduction with Resistance  -  2 x daily - 6 x weekly - 2-3 sets - 10 reps - Standing Single Arm Shoulder Abduction with Resistance (Mirrored)  - 2 x daily - 6 x weekly - 2-3 sets - 12 reps - Heel Walking  - 2 x daily - 6 x weekly - 2-3 sets - 12 reps  ASSESSMENT:  CLINICAL IMPRESSION: He has new shoulder sprain he saw MD about and MD is recommending to include some PT for Rt shoulder into his current plan. He has made progress overall but still with some Rt LE weakness in knee and DF compared to Lt LE. He also has some Rt shoulder RTC weakness that we will work to improve with PT. I did provide him with new exercises for Rt shoulder today and gave him print out for HEP. PT recommending 1 time per week for 6 more  weeks  OBJECTIVE IMPAIRMENTS: decreased activity tolerance, difficulty walking, decreased balance, decreased endurance, decreased mobility, decreased ROM, decreased strength, impaired flexibility, impaired LE use, postural dysfunction, and pain.  ACTIVITY LIMITATIONS: bending, lifting, carry, locomotion, cleaning, community activity,  PERSONAL FACTORS: childhood polio, OA are also affecting patient's functional outcome.  REHAB POTENTIAL: Good  CLINICAL DECISION MAKING: Stable/uncomplicated  EVALUATION COMPLEXITY: Low    GOALS: Short term PT Goals Target date: 03/07/2022   Pt will be I and compliant with HEP. Baseline:  Goal status: MET 03/07/22 Pt will decrease pain by 25% overall Baseline: Goal status: MET 03/07/22  Long term PT goals Target date:06/09/22   Pt will improve  hip/knee strength to at least 5-/5 MMT to improve functional strength Baseline: Goal status: ongoing, 4 for DF 04/28/22 Pt will improve FOTO to at least 69% functional to show improved function Baseline: Goal status: ongoing 04/28/22 Pt will reduce pain to overall less than 2-3/10 with usual activity and kayaking Baseline: Goal status: ongoing 04/28/22, had set back with shoulder sprain kayacking Pt will be able to hold SLS X 10 seconds on avg for his Rt leg to show improved balance.  Baseline: 3-4 seconds on Rt Goal status: ongoing 04/28/22  PLAN: PT FREQUENCY: 1 times per 2 weeks  PT DURATION: 12 weeks  PLANNED INTERVENTIONS (unless contraindicated): aquatic PT, Canalith repositioning, cryotherapy, Electrical stimulation, Iontophoresis with 4 mg/ml dexamethasome, Moist heat, traction, Ultrasound, gait training, Therapeutic exercise, balance training, neuromuscular re-education, patient/family education, prosthetic training, manual techniques, passive ROM, dry needling, taping, vasopnuematic device, vestibular, spinal manipulations, joint manipulations  PLAN FOR NEXT SESSION: assess if he is ready to  transition to independent program.   Debbe Odea, PT,DPT 04/28/2022, 2:03 PM

## 2022-05-04 DIAGNOSIS — H40013 Open angle with borderline findings, low risk, bilateral: Secondary | ICD-10-CM | POA: Diagnosis not present

## 2022-05-05 ENCOUNTER — Ambulatory Visit (HOSPITAL_BASED_OUTPATIENT_CLINIC_OR_DEPARTMENT_OTHER): Payer: Medicare Other | Admitting: Orthopaedic Surgery

## 2022-05-10 ENCOUNTER — Ambulatory Visit (INDEPENDENT_AMBULATORY_CARE_PROVIDER_SITE_OTHER): Payer: Medicare Other | Admitting: Physical Therapy

## 2022-05-10 ENCOUNTER — Encounter: Payer: Self-pay | Admitting: Physical Therapy

## 2022-05-10 DIAGNOSIS — M25552 Pain in left hip: Secondary | ICD-10-CM

## 2022-05-10 DIAGNOSIS — M6281 Muscle weakness (generalized): Secondary | ICD-10-CM

## 2022-05-10 DIAGNOSIS — M5459 Other low back pain: Secondary | ICD-10-CM

## 2022-05-10 DIAGNOSIS — R262 Difficulty in walking, not elsewhere classified: Secondary | ICD-10-CM | POA: Diagnosis not present

## 2022-05-10 DIAGNOSIS — M25561 Pain in right knee: Secondary | ICD-10-CM

## 2022-05-10 DIAGNOSIS — G8929 Other chronic pain: Secondary | ICD-10-CM

## 2022-05-10 DIAGNOSIS — M25511 Pain in right shoulder: Secondary | ICD-10-CM | POA: Diagnosis not present

## 2022-05-10 NOTE — Therapy (Signed)
OUTPATIENT PHYSICAL THERAPY TREATMENT   Patient Name: Jonathan Garrison MRN: [id number] DOB:05/05/1945, 77 y.o., male Today's Date: 05/10/2022  END OF SESSION:  PT End of Session - 05/10/22 1440     Visit Number 7    Number of Visits 12    Date for PT Re-Evaluation 06/09/22    Authorization Type MCR    Progress Note Due on Visit 16    PT Start Time 1430    PT Stop Time 1515    PT Time Calculation (min) 45 min    Activity Tolerance Patient tolerated treatment well    Behavior During Therapy WFL for tasks assessed/performed               Past Medical History:  Diagnosis Date   ADD (attention deficit disorder) without hyperactivity    Allergy    Anemia    no treatment   Anxiety    Arthralgia    many joints, managed with vitamins/herbs and ibuprofen   Arthritis    Cataract    Chest pain    Colitis 1986   single attack (believes was ulcerative)   Depression    Eczema    Esophageal stricture    GERD (gastroesophageal reflux disease)    Hemorrhoids    Lichen planus    Neuromuscular disorder    POLIO AGE 40   Oral cancer 05/2010   plans for surgery 06/2010   Osteoporosis    hips   Pleurisy 1984   Polio age 17   Umbilical hernia    Past Surgical History:  Procedure Laterality Date   COLONOSCOPY  2012   LEG SURGERY  when in 6th grade   L leg, bone spur removal (just above knee, laterally)   TONGUE SURGERY     for tongue cancer   UPPER GASTROINTESTINAL ENDOSCOPY     Patient Active Problem List   Diagnosis Date Noted   Localized osteoarthritis of both shoulders 03/22/2022   Lower back pain 03/22/2022   Neck pain, chronic 03/22/2022   IT band syndrome 03/22/2022   Essential hypertension 02/22/2022   Family history of heart disease 02/22/2022   Trochanteric bursitis of left hip 08/01/2021   Pain of both hip joints 07/20/2021   Seasonal allergic rhinitis 05/24/2021   ETD (Eustachian tube dysfunction), bilateral 05/24/2021   Gastroesophageal reflux disease  without esophagitis 12/15/2017   Dysphagia 12/15/2017   History of oral cancer 12/15/2017   Increased prostate specific antigen (PSA) velocity 11/27/2017   Osteopenia 08/01/2017   Vitamin B12 deficiency anemia 06/09/2014   Chest pain 03/13/2013   Depression with anxiety 08/26/2011   Overweight (BMI 25.0-29.9) 08/26/2011   Joint pain 08/26/2011   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   Oral cancer 07/09/2010    PCP: Pearline Cables, MD   REFERRING PROVIDER: Huel Cote, MD  REFERRING DIAG: M53.3,G89.29 (ICD-10-CM) - Chronic left SI joint pain. Rt knee pain, Rt shoulder sprain  Rationale for Evaluation and Treatment: Rehabilitation  THERAPY DIAG:  Other low back pain  Pain in left hip  Chronic pain of right knee  Muscle weakness (generalized)  Difficulty in walking, not elsewhere classified  Acute pain of right shoulder  ONSET DATE: chronic pain for years  SUBJECTIVE:  SUBJECTIVE STATEMENT: He relays back pain today but not really having any shoulder pain or knee pain today  PERTINENT HISTORY:  Polio as child, OA  PAIN:  Are you having pain? Yes: NPRS scale: 6/10 Pain location: back Pain description: ache Aggravating factors: stairs, worse first thing in the morning Relieving factors: meloxicam, voltaren  PRECAUTIONS: None  WEIGHT BEARING RESTRICTIONS: No  FALLS:  Has patient fallen in last 6 months? No  OCCUPATION: retired  PLOF: Independent  PATIENT GOALS: reduce pain,   NEXT MD VISIT:   OBJECTIVE:   DIAGNOSTIC FINDINGS:  Xray (right knee 4 views, left knee 4 views): Right knee with very mild degenerative findings particularly about the medial joint space  PATIENT SURVEYS:  Eval: FOTO 67% functional   COGNITION: Overall cognitive status: Within  functional limits for tasks assessed     SENSATION: WFL  MUSCLE LENGTH: Hamstrings: mildly tight at 80 deg bilat ITB tightness mildly on Rt  POSTURE:   PALPATION: Tender to palpation over Lt SIJ  LUMBAR ROM:   AROM eval  Flexion WNL  Extension WNL  Right lateral flexion WNL  Left lateral flexion WNL  Right rotation WNL  Left rotation WNL   (Blank rows = not tested)   ROM:     Active  Right eval Left eval Rt/Lt 04/28/22  Hip flexion [facility]   Hip extension     Hip abduction [facility]   Hip adduction     Hip internal rotation     Hip external rotation     Knee flexion [facility] WFL   Knee extension [facility]   Shoulder flexion   150  Shoulder abduction   155              (Blank rows = not tested)   MMT:    MMT Right eval Left eval Rt/Lt 04/28/22  Hip flexion 4+ 4+ 5/5  Hip extension     Hip abduction 4+ 4+ 4+/4+  Hip adduction     Hip internal rotation     Hip external rotation     Knee flexion 5 5 5/5  Knee extension 5- 5- 5/5  Ankle dorsiflexion 4 5 4/5       Shoulder flexion   5/5  Shoulder abduction   4+/5  Shoulder IR   5/5  Shoulder ER   4/4+   (Blank rows = not tested)  LUMBAR SPECIAL TESTS:  Eval: Straight leg raise test: Negative and Slump test: Negative Negative SIJ testing No pain with PAM testing to lumbar spine  FUNCTIONAL TESTS:  Eval: SLS X 10 seconds on left, 3-4 seconds on Rt  GAIT: Eval: Comments: independent community Ambulator, 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  TODAY'S TREATMENT:  05/10/2422 -Nu step L6 UE/LE X 8 min -prone alternating opposite arm/leg lifts 6 reps X 2 sets each side -Prone bilat hip extensions 2 X 6 reps -Quadriped leg extensions 2 X 6 reps bilat, cues for neutral spine and not to over rotate -Sit to stands with 10# KB, no UE support 2 X 6 reps -Plank from knees and elbows 30-20 seconds X 6 reps -heel walking in bars and tandem walk 3 round trips with UE support -Leg press DL [id number]# [id number], then SL 28# X 12 bilat and  one extra set of 6 reps for Rt   04/28/2422 -Sci fit bike L5 UE/LE X 6 min then 2 minute cooldown -Standing shoulder abduction with green band 2X12 bilat -Standing shoulder ER with green band  2X12 bilat -Standing shoulder horizontal abd with green band 2X12 bilat -heel walking in bars and tandem walk 3 round trips with UE support -Leg press DL 102# [id number], then SL 66# X 12 bilat and one extra set of 6 reps for Rt -updated measurements and goals, see above and below for details   PATIENT EDUCATION: Education details: HEP, PT plan of care Person educated: Patient Education method: Explanation, Demonstration, Verbal cues, and Handouts Education comprehension: verbalized understanding and needs further education   HOME EXERCISE PROGRAM: Access Code: [email or web address] URL: [email or web address] Date: 05/10/2022 Prepared by: Ivery Quale  Exercises - Single Leg Bridge  - 2 x daily - 6 x weekly - 1-2 sets - 10 reps - 5 hold - Seated Hamstring Stretch  - 2 x daily - 6 x weekly - 1 sets - 2 reps - 30 hold - Standing ITB Stretch  - 2 x daily - 6 x weekly - 2-3 sets - 30 hold - Heel Toe Raises with Counter Support  - 2 x daily - 6 x weekly - 2-3 sets - 10 reps - Single Leg Stance  - 2 x daily - 6 x weekly - 1 sets - 5 reps - 10 sec hold - Side Stepping with Resistance at Ankles  - 2 x daily - 6 x weekly - 2-3 sets - 10 reps - Sit to Stand Without Arm Support  - 2 x daily - 6 x weekly - 1-2 sets - 10 reps - Supine Active Straight Leg Raise  - 2 x daily - 6 x weekly - 1-2 sets - 12 reps - Sidelying Hip Abduction (Both Sides)  - 2 x daily - 6 x weekly - 1-2 sets - 12 reps - Prone Hip Extension  - 2 x daily - 6 x weekly - 1-2 sets - 12 reps - Bird Dog  - 2 x daily - 6 x weekly - 1 sets - 12 reps - 2 sec hold - Supine Piriformis Stretch with Foot on Ground  - 2 x daily - 6 x weekly - 2 sets - 30 hold - Tandem Walking  - 2 x daily - 6 x weekly - 3 sets - 12 reps - Shoulder External Rotation  with Anchored Resistance  - 2 x daily - 6 x weekly - 2-3 sets - 12 reps - Shoulder External Rotation and Scapular Retraction with Resistance  - 2 x daily - 6 x weekly - 2-3 sets - 12 reps - Standing Shoulder Horizontal Abduction with Resistance  - 2 x daily - 6 x weekly - 2-3 sets - 10 reps - Standing Single Arm Shoulder Abduction with Resistance (Mirrored)  - 2 x daily - 6 x weekly - 2-3 sets - 12 reps - Heel Walking  - 2 x daily - 6 x weekly - 2-3 sets - 12 reps - Prone Alternating Arm and Leg Lifts  - 1 x daily - 3 x weekly - 1-2 sets - 12 reps - Prone Double Leg Lift  - 1 x daily - 3 x weekly - 1-2 sets - 12 reps - Quadruped Leg Lifts  - 1 x daily - 3 x weekly - 1-2 sets - 12 reps - Plank on Knees  - 1 x daily - 3 x weekly - 1 sets - 6 reps - 20-30 sec hold  ASSESSMENT:  CLINICAL IMPRESSION: He was having a little more overall back pain today and wanted to know more low back strengthening exercises he can do.  I showed him some of these and printed them out for him to include into his HEP.  PT recommending 1 time per week for 6 more weeks  OBJECTIVE IMPAIRMENTS: decreased activity tolerance, difficulty walking, decreased balance, decreased endurance, decreased mobility, decreased ROM, decreased strength, impaired flexibility, impaired LE use, postural dysfunction, and pain.  ACTIVITY LIMITATIONS: bending, lifting, carry, locomotion, cleaning, community activity,  PERSONAL FACTORS: childhood polio, OA are also affecting patient's functional outcome.  REHAB POTENTIAL: Good  CLINICAL DECISION MAKING: Stable/uncomplicated  EVALUATION COMPLEXITY: Low    GOALS: Short term PT Goals Target date: 03/07/2022   Pt will be I and compliant with HEP. Baseline:  Goal status: MET 03/07/22 Pt will decrease pain by 25% overall Baseline: Goal status: MET 03/07/22  Long term PT goals Target date:06/09/22   Pt will improve  hip/knee strength to at least 5-/5 MMT to improve functional  strength Baseline: Goal status: ongoing, 4 for DF 04/28/22 Pt will improve FOTO to at least 69% functional to show improved function Baseline: Goal status: ongoing 04/28/22 Pt will reduce pain to overall less than 2-3/10 with usual activity and kayaking Baseline: Goal status: ongoing 04/28/22, had set back with shoulder sprain kayacking Pt will be able to hold SLS X 10 seconds on avg for his Rt leg to show improved balance.  Baseline: 3-4 seconds on Rt Goal status: ongoing 04/28/22  PLAN: PT FREQUENCY: 1 times per 2 weeks  PT DURATION: 12 weeks  PLANNED INTERVENTIONS (unless contraindicated): aquatic PT, Canalith repositioning, cryotherapy, Electrical stimulation, Iontophoresis with 4 mg/ml dexamethasome, Moist heat, traction, Ultrasound, gait training, Therapeutic exercise, balance training, neuromuscular re-education, patient/family education, prosthetic training, manual techniques, passive ROM, dry needling, taping, vasopnuematic device, vestibular, spinal manipulations, joint manipulations  PLAN FOR NEXT SESSION: lumbar/core strength, leg strength progressions as tolerated.   April Manson, PT,DPT 05/10/2022, 2:47 PM

## 2022-05-18 DIAGNOSIS — F422 Mixed obsessional thoughts and acts: Secondary | ICD-10-CM | POA: Diagnosis not present

## 2022-05-23 NOTE — Progress Notes (Unsigned)
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 male patient of Dr Allyson Sabal, in the office today for hypertension management.  He was most recently seen by Laural Golden PharmD in February, at which time his pressure was elevated at 142/77.  Losartan was increased from 50 to 75 mg daily at that time.    Today he returns for follow up.  Patient is a retired Retail buyer and notes that he does have some stresses in his life, but doing well overall.  Has no concern about his medication and has been checking BP randomly at home (see below).    Blood Pressure Goal:  130/80  Current Medications:  losartan 75 mg qd,   Family Hx:   father had several MI and CABG x 3, mom had polio - in wheelchair from young age; sister with lung cancer - being managed; brother prostate issues ; 2 daughters - one unknown currently, other healthy  Social Hx:      Tobacco:no (smoked in his 63's) pipe in [place] - cancer of tongue managed at [facility]  Alcohol: 1-2 beer in the afternoon/dinner  Caffeine:  2 coffee (strong) daily  Diet:    at last visit was noted to be high in sodium - dinner out most nights or soup/crackers if eats at home.  Today he notes that soups are always low sodium   Exercise: walks daily 30-45 min[place]  Home BP readings:   has 6 readings from the past month - average 133/80  (range 120-145/75-85)   Accessory Clinical Findings    Lab Results  Component Value Date   CREATININE 0.88 03/23/2022   BUN 25 (H) 03/23/2022   NA 140 03/23/2022   K 4.4 03/23/2022   CL 104 03/23/2022   CO2 30  03/23/2022   Lab Results  Component Value Date   ALT 24 03/23/2022   AST 29 03/23/2022   ALKPHOS 61 03/23/2022   BILITOT 0.6 03/23/2022   Lab Results  Component Value Date   HGBA1C 5.5 03/23/2022    Home Medications    Current Outpatient Medications  Medication Sig Dispense Refill   losartan (COZAAR) 100 MG tablet Take 1 tablet (100 mg total) by mouth daily. 90 tablet 3   rosuvastatin (CRESTOR) 20 MG tablet Take 1 tablet (20 mg total) by mouth daily. 90 tablet 3   amphetamine-dextroamphetamine (ADDERALL) 20 MG tablet Take 1 tablet (20 mg total) by mouth 2 (two) times daily. 60 tablet 0   aspirin 81 MG tablet Take 81 mg by mouth daily.     b complex vitamins tablet Take 0.5 tablets by mouth daily.     Biotin 5000 MCG CAPS Take 1 capsule by mouth daily.     Calcium-Magnesium-Zinc 167-83-8 MG TABS Take by mouth. 1 tablet daily     Cholecalciferol (VITAMIN D3) 2000 UNITS capsule Take 2,000 Units by mouth daily.     ferrous sulfate 324 (65 Fe) MG TBEC Take one every other day for low iron 45 tablet 3   Fluorouracil (TOLAK) 4 % CREA  Total of 28 applications to the area 40 g 0   fluticasone (FLONASE) 50 MCG/ACT nasal spray SHAKE LIQUID AND USE 2 SPRAYS IN EACH NOSTRIL DAILY 16 g 11   glucosamine-chondroitin 500-400 MG tablet Take 1 tablet by mouth 2 (two) times daily.     meloxicam (MOBIC) 15 MG tablet TAKE 1 TABLET(15 MG) BY MOUTH DAILY AS NEEDED 90 tablet 2   Multiple Vitamins-Minerals (MULTIVITAMIN WITH MINERALS) tablet Take 1 tablet by mouth daily.     NON FORMULARY ALJ -herbal decongestant  Takes as needed     omeprazole (PRILOSEC) 40 MG capsule Take 1 capsule (40 mg total) by mouth in the morning and at bedtime. 1803 capsule 3   sildenafil (REVATIO) 20 MG tablet Take 2-3 tablets one hour prior to intercourse 60 tablet 5   Zn-Pyg Afri-Nettle-Saw Palmet (SAW PALMETTO COMPLEX PO) Take 1 tablet by mouth 2 (two) times daily.     No current facility-administered medications for this  visit.         Assessment & Plan    Essential hypertension Assessment: BP is slightly elevated in office BP 133/76 mmHg;  above the goal (<130/80). No concerns for medication compliance, although does not like cutting tablets Tolerates losartan 75 mg well without any side effects Denies SOB, palpitation, chest pain, headaches,or swelling Reiterated the importance of regular exercise and low salt diet   Plan:  Increase losartan to 100 mg once daily Patient to keep record of BP readings with heart rate and report to [place] at the next visit Patient to follow up with Dr. Allyson Sabal in July  Labs ordered today:  none   Hyperlipidemia Assessment: Most recent LDL 62 on atorvastatin 40 Has been compliant with high intensity statin : atorvastatin 40 Questioning whether muscle aches are part of aging or medication related  Plan: Patient agreeable to stopping atorvastatin, wait 1 week then start rosuvastatin 20 mg daily Repeat labs after:  3 months Lipid Liver function   [name] Hay PharmD CPP [facility]  [address] [place], [place] [address] [phone]

## 2022-05-24 ENCOUNTER — Ambulatory Visit
Payer: Medicare Other | Attending: Cardiovascular Disease | Admitting: Pharmacist Clinician (PhC)/ Clinical Pharmacy Specialist

## 2022-05-24 VITALS — BP 133/76 | HR 76 | Ht 74.0 in | Wt 196.0 lb

## 2022-05-24 DIAGNOSIS — E785 Hyperlipidemia, unspecified: Secondary | ICD-10-CM | POA: Diagnosis not present

## 2022-05-24 DIAGNOSIS — I10 Essential (primary) hypertension: Secondary | ICD-10-CM

## 2022-05-24 MED ORDER — LOSARTAN POTASSIUM 100 MG PO TABS: 100.0000 mg | ORAL_TABLET | Freq: Every day | ORAL | 3 refills | Status: DC

## 2022-05-24 MED ORDER — ROSUVASTATIN CALCIUM 20 MG PO TABS: 20.0000 mg | ORAL_TABLET | Freq: Every day | ORAL | 3 refills | Status: DC

## 2022-05-24 NOTE — Patient Instructions (Signed)
Follow up appointment: with Dr. Allyson Sabal in July  Take your BP meds as follows:  Increase losartan to 100 mg once daily   Stop atorvastatin (cholesterol) and after 1 week then start rosuvastatin 20 mg   Check your blood pressure at home daily (if able) and keep record of the readings.  Hypertension "High blood pressure"  Hypertension is often called "The Silent Killer." It rarely causes symptoms until it is extremely  high or has done damage to other organs in the body. For this reason, you should have your  blood pressure checked regularly by your physician. We will check your blood pressure  every time you see a provider at one of our offices.   Your blood pressure reading consists of two numbers. Ideally, blood pressure should be  below 120/80. The first ("top") number is called the systolic pressure. It measures the  pressure in your arteries as your heart beats. The second ("bottom") number is called the diastolic pressure. It measures the pressure in your arteries as the heart relaxes between beats.  The benefits of getting your blood pressure under control are enormous. A 10-point  reduction in systolic blood pressure can reduce your risk of stroke by 27% and heart failure by 28%  Your blood pressure goal is 130/80  To check your pressure at home you will need to:  1. Sit up in a chair, with feet flat on the floor and back supported. Do not cross your ankles or legs. 2. Rest your left arm so that the cuff is about heart level. If the cuff goes on your upper arm,  then just relax the arm on the table, arm of the chair or your lap. If you have a wrist cuff, we  suggest relaxing your wrist against your chest (think of it as Pledging the Flag with the  wrong arm).  3. Place the cuff snugly around your arm, about 1 inch above the crook of your elbow. The  cords should be inside the groove of your elbow.  4. Sit quietly, with the cuff in place, for about 5 minutes. After that 5  minutes press the power  button to start a reading. 5. Do not talk or move while the reading is taking place.  6. Record your readings on a sheet of paper. Although most cuffs have a memory, it is often  easier to see a pattern developing when the numbers are all in front of you.  7. You can repeat the reading after 1-3 minutes if it is recommended  Make sure your bladder is empty and you have not had caffeine or tobacco within the last 30 min  Always bring your blood pressure log with you to your appointments. If you have not brought your monitor in to be double checked for accuracy, please bring it to your next appointment.  You can find a list of quality blood pressure cuffs at validatebp.org

## 2022-05-25 ENCOUNTER — Encounter: Payer: Self-pay | Admitting: Physical Therapy

## 2022-05-25 ENCOUNTER — Ambulatory Visit (INDEPENDENT_AMBULATORY_CARE_PROVIDER_SITE_OTHER): Payer: Medicare Other | Admitting: Physical Therapy

## 2022-05-25 ENCOUNTER — Encounter: Payer: Self-pay | Admitting: Pharmacist Clinician (PhC)/ Clinical Pharmacy Specialist

## 2022-05-25 DIAGNOSIS — R262 Difficulty in walking, not elsewhere classified: Secondary | ICD-10-CM | POA: Diagnosis not present

## 2022-05-25 DIAGNOSIS — M6281 Muscle weakness (generalized): Secondary | ICD-10-CM | POA: Diagnosis not present

## 2022-05-25 DIAGNOSIS — E785 Hyperlipidemia, unspecified: Secondary | ICD-10-CM | POA: Insufficient documentation

## 2022-05-25 DIAGNOSIS — M25552 Pain in left hip: Secondary | ICD-10-CM | POA: Diagnosis not present

## 2022-05-25 DIAGNOSIS — G8929 Other chronic pain: Secondary | ICD-10-CM

## 2022-05-25 DIAGNOSIS — M5459 Other low back pain: Secondary | ICD-10-CM | POA: Diagnosis not present

## 2022-05-25 DIAGNOSIS — M25561 Pain in right knee: Secondary | ICD-10-CM

## 2022-05-25 NOTE — Assessment & Plan Note (Signed)
Assessment: BP is slightly elevated in office BP 133/76 mmHg;  above the goal (<130/80). No concerns for medication compliance, although does not like cutting tablets Tolerates losartan 75 mg well without any side effects Denies SOB, palpitation, chest pain, headaches,or swelling Reiterated the importance of regular exercise and low salt diet   Plan:  Increase losartan to 100 mg once daily Patient to keep record of BP readings with heart rate and report to [place] at the next visit Patient to follow up with Dr. Allyson Sabal in July  Labs ordered today:  none

## 2022-05-25 NOTE — Therapy (Signed)
OUTPATIENT PHYSICAL THERAPY TREATMENT   Patient Name: Jonathan Garrison MRN: [id number] DOB:05-May-1945, 77 y.o., male Today's Date: 05/25/2022  END OF SESSION:      Past Medical History:  Diagnosis Date   ADD (attention deficit disorder) without hyperactivity    Allergy    Anemia    no treatment   Anxiety    Arthralgia    many joints, managed with vitamins/herbs and ibuprofen   Arthritis    Cataract    Chest pain    Colitis 1986   single attack (believes was ulcerative)   Depression    Eczema    Esophageal stricture    GERD (gastroesophageal reflux disease)    Hemorrhoids    Lichen planus    Neuromuscular disorder (HCC)    POLIO AGE 44   Oral cancer (HCC) 05/2010   plans for surgery 06/2010   Osteoporosis    hips   Pleurisy 1984   Polio age 21   Umbilical hernia    Past Surgical History:  Procedure Laterality Date   COLONOSCOPY  2012   LEG SURGERY  when in 6th grade   L leg, bone spur removal (just above knee, laterally)   TONGUE SURGERY     for tongue cancer   UPPER GASTROINTESTINAL ENDOSCOPY     Patient Active Problem List   Diagnosis Date Noted   Hyperlipidemia 05/25/2022   Localized osteoarthritis of both shoulders 03/22/2022   Lower back pain 03/22/2022   Neck pain, chronic 03/22/2022   IT band syndrome 03/22/2022   Essential hypertension 02/22/2022   Family history of heart disease 02/22/2022   Trochanteric bursitis of left hip 08/01/2021   Pain of both hip joints 07/20/2021   Seasonal allergic rhinitis 05/24/2021   ETD (Eustachian tube dysfunction), bilateral 05/24/2021   Gastroesophageal reflux disease without esophagitis 12/15/2017   Dysphagia 12/15/2017   History of oral cancer 12/15/2017   Increased prostate specific antigen (PSA) velocity 11/27/2017   Osteopenia 08/01/2017   Vitamin B12 deficiency anemia 06/09/2014   Chest pain 03/13/2013   Depression with anxiety 08/26/2011   Overweight (BMI 25.0-29.9) 08/26/2011   Joint pain 08/26/2011    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 Rt knee pain, Rt shoulder sprain  Rationale for Evaluation and Treatment: Rehabilitation  THERAPY DIAG:  Other low back pain  Pain in left hip  Chronic pain of right knee  Muscle weakness (generalized)  Difficulty in walking, not elsewhere classified  ONSET DATE: chronic pain for years  SUBJECTIVE:  SUBJECTIVE STATEMENT: He relays more overall low back pain today but his knee is feeling pretty good.  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 Yes: NPRS scale: 6-7/10 Pain location: back Pain description: ache Aggravating factors: stairs, worse first thing in the morning Relieving factors: meloxicam, voltaren  PRECAUTIONS: None  WEIGHT BEARING RESTRICTIONS: No  FALLS:  Has patient fallen in last 6 months? No  OCCUPATION: retired  PLOF: Independent  PATIENT GOALS: reduce pain,   NEXT MD VISIT:   OBJECTIVE:   DIAGNOSTIC FINDINGS:  Xray (right knee 4 views, left knee 4 views): Right knee with very mild degenerative findings particularly about the medial joint space  PATIENT SURVEYS:  Eval: FOTO 67% functional   COGNITION: Overall cognitive status: Within functional limits for tasks assessed     SENSATION: WFL  MUSCLE LENGTH: Hamstrings: mildly tight at 80 deg bilat ITB tightness mildly on Rt  POSTURE:   PALPATION: Tender to palpation over Lt SIJ  LUMBAR ROM:   AROM eval  Flexion WNL  Extension WNL  Right lateral flexion WNL  Left lateral flexion WNL  Right rotation WNL  Left rotation WNL   (Blank rows = not  tested)   ROM:     Active  Right eval Left eval Rt/Lt 04/28/22  Hip flexion [facility]   Hip extension     Hip abduction [facility]   Hip adduction     Hip internal rotation     Hip external rotation     Knee flexion [facility] WFL   Knee extension [facility]   Shoulder flexion   150  Shoulder abduction   155              (Blank rows = not tested)   MMT:    MMT Right eval Left eval Rt/Lt 04/28/22  Hip flexion 4+ 4+ 5/5  Hip extension     Hip abduction 4+ 4+ 4+/4+  Hip adduction     Hip internal rotation     Hip external rotation     Knee flexion 5 5 5/5  Knee extension 5- 5- 5/5  Ankle dorsiflexion 4 5 4/5       Shoulder flexion   5/5  Shoulder abduction   4+/5  Shoulder IR   5/5  Shoulder ER   4/4+   (Blank rows = not tested)  LUMBAR SPECIAL TESTS:  Eval: Straight leg raise test: Negative and Slump test: Negative Negative SIJ testing No pain with PAM testing to lumbar spine  FUNCTIONAL TESTS:  Eval: SLS X 10 seconds on left, 3-4 seconds on Rt  GAIT: Eval: Comments: independent community Ambulator, 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  TODAY'S TREATMENT:  05/25/22 -Nu step L6 UE/LE X 8 min -Single knee to chest stretch 30 sec X 2 bilat -Double knee to chest stretch 30 sec X 2 -Supine piriformis stretch 30 sec  X2 bilat -Supine hamstring stretch with strap 30 sec X 2 -Supine double SLR with isometric hold for core 20 sec X 2 -Single leg bridge 6 sec holds 2X 6 bilat -prone alternating opposite arm/leg lifts 6 reps X 2 sets each side -Quadriped leg extensions 2 X 6 reps bilat, cues for neutral spine and not to over rotate -Seated lumbar Pball roll outs for flexion stretch 6 sec hold X 12 reps, then feet on ball for DKTC stretch 6 sec X 12 -Sit to stands no UE support 2 X 6 reps -wall squats with  back on ball  X12 reps  05/10/2422 -Nu step L6 UE/LE X 8 min -prone alternating opposite arm/leg lifts 6 reps X 2 sets each side -Prone bilat hip extensions 2 X 6 reps -Quadriped leg  extensions 2 X 6 reps bilat, cues for neutral spine and not to over rotate -Sit to stands with 10# KB, no UE support 2 X 6 reps -Plank from knees and elbows 30-20 seconds X 6 reps -heel walking in bars and tandem walk 3 round trips with UE support -Leg press DL 161# [id number], then SL 45# X 12 bilat and one extra set of 6 reps for Rt   PATIENT EDUCATION: Education details: HEP, PT plan of care Person educated: Patient Education method: Explanation, Demonstration, Verbal cues, and Handouts Education comprehension: verbalized understanding and needs further education   HOME EXERCISE PROGRAM: Access Code: [email or web address] URL: [email or web address] Date: 05/10/2022 Prepared by: Ivery Quale  Exercises - Single Leg Bridge  - 2 x daily - 6 x weekly - 1-2 sets - 10 reps - 5 hold - Seated Hamstring Stretch  - 2 x daily - 6 x weekly - 1 sets - 2 reps - 30 hold - Standing ITB Stretch  - 2 x daily - 6 x weekly - 2-3 sets - 30 hold - Heel Toe Raises with Counter Support  - 2 x daily - 6 x weekly - 2-3 sets - 10 reps - Single Leg Stance  - 2 x daily - 6 x weekly - 1 sets - 5 reps - 10 sec hold - Side Stepping with Resistance at Ankles  - 2 x daily - 6 x weekly - 2-3 sets - 10 reps - Sit to Stand Without Arm Support  - 2 x daily - 6 x weekly - 1-2 sets - 10 reps - Supine Active Straight Leg Raise  - 2 x daily - 6 x weekly - 1-2 sets - 12 reps - Sidelying Hip Abduction (Both Sides)  - 2 x daily - 6 x weekly - 1-2 sets - 12 reps - Prone Hip Extension  - 2 x daily - 6 x weekly - 1-2 sets - 12 reps - Bird Dog  - 2 x daily - 6 x weekly - 1 sets - 12 reps - 2 sec hold - Supine Piriformis Stretch with Foot on Ground  - 2 x daily - 6 x weekly - 2 sets - 30 hold - Tandem Walking  - 2 x daily - 6 x weekly - 3 sets - 12 reps - Shoulder External Rotation with Anchored Resistance  - 2 x daily - 6 x weekly - 2-3 sets - 12 reps - Shoulder External Rotation and Scapular Retraction with Resistance  - 2 x daily  - 6 x weekly - 2-3 sets - 12 reps - Standing Shoulder Horizontal Abduction with Resistance  - 2 x daily - 6 x weekly - 2-3 sets - 10 reps - Standing Single Arm Shoulder Abduction with Resistance (Mirrored)  - 2 x daily - 6 x weekly - 2-3 sets - 12 reps - Heel Walking  - 2 x daily - 6 x weekly - 2-3 sets - 12 reps - Prone Alternating Arm and Leg Lifts  - 1 x daily - 3 x weekly - 1-2 sets - 12 reps - Prone Double Leg Lift  - 1 x daily - 3 x weekly - 1-2 sets - 12 reps - Quadruped Leg Lifts  - 1 x daily - 3 x weekly -  1-2 sets - 12 reps - Plank on Knees  - 1 x daily - 3 x weekly - 1 sets - 6 reps - 20-30 sec hold  ASSESSMENT:  CLINICAL IMPRESSION: He is still having some back pain so stretching and strengthening exercises for this were the focus of today's sessions. He may benefit from DN however he wants to research this a little more before trying it. I did provide him with an education sheet for it and he can decide in the future it this is something he wants to try if his back pain persists.   OBJECTIVE IMPAIRMENTS: decreased activity tolerance, difficulty walking, decreased balance, decreased endurance, decreased mobility, decreased ROM, decreased strength, impaired flexibility, impaired LE use, postural dysfunction, and pain.  ACTIVITY LIMITATIONS: bending, lifting, carry, locomotion, cleaning, community activity,  PERSONAL FACTORS: childhood polio, OA are also affecting patient's functional outcome.  REHAB POTENTIAL: Good  CLINICAL DECISION MAKING: Stable/uncomplicated  EVALUATION COMPLEXITY: Low    GOALS: Short term PT Goals Target date: 03/07/2022   Pt will be I and compliant with HEP. Baseline:  Goal status: MET 03/07/22 Pt will decrease pain by 25% overall Baseline: Goal status: MET 03/07/22  Long term PT goals Target date:06/09/22   Pt will improve  hip/knee strength to at least 5-/5 MMT to improve functional strength Baseline: Goal status: ongoing, 4 for DF 04/28/22 Pt  will improve FOTO to at least 69% functional to show improved function Baseline: Goal status: ongoing 04/28/22 Pt will reduce pain to overall less than 2-3/10 with usual activity and kayaking Baseline: Goal status: ongoing 04/28/22, had set back with shoulder sprain kayacking Pt will be able to hold SLS X 10 seconds on avg for his Rt leg to show improved balance.  Baseline: 3-4 seconds on Rt Goal status: ongoing 04/28/22  PLAN: PT FREQUENCY: 1 times per 2 weeks  PT DURATION: 12 weeks  PLANNED INTERVENTIONS (unless contraindicated): aquatic PT, Canalith repositioning, cryotherapy, Electrical stimulation, Iontophoresis with 4 mg/ml dexamethasome, Moist heat, traction, Ultrasound, gait training, Therapeutic exercise, balance training, neuromuscular re-education, patient/family education, prosthetic training, manual techniques, passive ROM, dry needling, taping, vasopnuematic device, vestibular, spinal manipulations, joint manipulations  PLAN FOR NEXT SESSION: lumbar/core strength, leg strength progressions as tolerated.   April Manson, PT,DPT 05/25/2022, 1:50 PM

## 2022-05-25 NOTE — Assessment & Plan Note (Signed)
Assessment: Most recent LDL 62 on atorvastatin 40 Has been compliant with high intensity statin : atorvastatin 40 Questioning whether muscle aches are part of aging or medication related  Plan: Patient agreeable to stopping atorvastatin, wait 1 week then start rosuvastatin 20 mg daily Repeat labs after:  3 months Lipid Liver function

## 2022-05-30 DIAGNOSIS — Z20822 Contact with and (suspected) exposure to covid-19: Secondary | ICD-10-CM | POA: Diagnosis not present

## 2022-05-30 DIAGNOSIS — J189 Pneumonia, unspecified organism: Secondary | ICD-10-CM | POA: Diagnosis not present

## 2022-05-30 DIAGNOSIS — J029 Acute pharyngitis, unspecified: Secondary | ICD-10-CM | POA: Diagnosis not present

## 2022-05-31 ENCOUNTER — Encounter: Payer: Self-pay | Admitting: Family Medicine

## 2022-05-31 NOTE — Progress Notes (Unsigned)
[facility] [address], [address] [place], [place] [address] [phone]  Date:  06/01/2022   Name:  Jonathan Garrison   DOB:  1945/04/09   MRN:  [id number]  PCP:  Pearline Cables, MD    Chief Complaint: UC follow up (Sxs present x 4 days. Sore throat, BO, fever, cough. He says he feels worse after taking a walk yesterday. Temp was 101.5 temp this morning. /Pt was seen at [facility] [address] 05-30-22. He had poc flu, covid, strep- all were neg. CXR was done as well. Pt was Rx: Augm 875 BID x 7 days, Z-pack, Promethazine DM.//Concerns/ questions: Per last lab results you wanted to recheck BUN and PSA 6 months from 03/23/22 OV. Okay to check today?/)   History of Present Illness:  Jonathan Garrison is a 77 y.o. very pleasant male patient who presents with the following:  Pt seen today for follow-up of recent illness History of anxiety, depression, oral cancer, osteopenia, B12 deficiency, and GERD.  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  He reached out to me yesterday, having been seen at UC the day prior/ Monday  and tested negative for covid, flu and strep, and apparently had a normal CXR but provider started him on augmentin and zpack due to concern of possible pneumonia .  I reviewed x-ray report and it is negative-however provider heard Rales in his lower lungs  He did not pick up the abx yet but he will start today He was starting to feel better but then got worse again the last day or so- he has a fever again today  Jonathan Garrison first noticed symptoms of fatigue, achiness and fever on Friday when he was at a music festival.  He had to skip the festival on Saturday, started to run fever so he went to urgent care on Monday.  Yesterday he felt a bit better, but notes the temperature is back today His main sx besides fever is cough  No vomiting but he did feel nauseated  He did take meloxicam today as usual    He also wondered about doing labs today: i am trying to recall if we were going to do some follow-up blood work:  BUN, iron, anything else ? and when is that appropriate ? maybe this [facility]. ? thanks, [employer]  Component Value Date   PSA 1.40 03/23/2022   PSA 0.91 03/18/2021   PSA 1.07 01/29/2020       Patient Active Problem List   Diagnosis Date Noted   Hyperlipidemia 05/25/2022   Localized osteoarthritis of both shoulders 03/22/2022   Lower back pain 03/22/2022   Neck pain, chronic 03/22/2022   IT band syndrome 03/22/2022   Essential hypertension 02/22/2022   Family history of heart disease 02/22/2022   Trochanteric bursitis of left hip 08/01/2021   Pain of both hip joints 07/20/2021   Seasonal allergic rhinitis 05/24/2021   ETD (Eustachian tube dysfunction), bilateral 05/24/2021   Gastroesophageal reflux disease without esophagitis 12/15/2017   Dysphagia 12/15/2017   History of oral cancer 12/15/2017   Increased prostate specific antigen (PSA) velocity 11/27/2017   Osteopenia 08/01/2017   Vitamin B12 deficiency anemia 06/09/2014   Chest pain 03/13/2013   Depression with anxiety 08/26/2011   Overweight (BMI 25.0-29.9) 08/26/2011   Joint pain 08/26/2011   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   Oral cancer (HCC)  07/09/2010    Past Medical History:  Diagnosis Date   ADD (attention deficit disorder) without hyperactivity    Allergy    Anemia    no treatment   Anxiety    Arthralgia    many joints, managed with vitamins/herbs and ibuprofen   Arthritis    Cataract    Chest pain    Colitis 1986   single attack (believes was ulcerative)   Depression    Eczema    Esophageal stricture    GERD (gastroesophageal reflux disease)    Hemorrhoids    Lichen planus    Neuromuscular disorder (HCC)    POLIO AGE 47   Oral cancer (HCC) 05/2010   plans for surgery 06/2010   Osteoporosis    hips   Pleurisy 1984   Polio age 33   Umbilical hernia     Past  Surgical History:  Procedure Laterality Date   COLONOSCOPY  2012   LEG SURGERY  when in 6th grade   L leg, bone spur removal (just above knee, laterally)   TONGUE SURGERY     for tongue cancer   UPPER GASTROINTESTINAL ENDOSCOPY      Social History   Tobacco Use   Smoking status: Former    Types: Pipe, Cigarettes    Quit date: 01/25/1976    Years since quitting: 46.3   Smokeless tobacco: Never   Tobacco comments:    Quit smoking 35 years ago  Vaping Use   Vaping Use: Never used  Substance Use Topics   Alcohol use: Yes    Alcohol/week: 7.0 - 14.0 standard drinks of alcohol    Types: 7 - 14 Glasses of wine per week   Drug use: No    Family History  Problem Relation Age of Onset   Hypertension Mother    Diabetes Mother    Hyperlipidemia Mother    Hypertension Father    Benign prostatic hyperplasia Father    Heart disease Father    Lichen planus Sister    Cancer Sister        ?spot on chest? contained   Diabetes Maternal Grandfather    Colon cancer Neg Hx    Esophageal cancer Neg Hx    Stomach cancer Neg Hx    Pancreatic cancer Neg Hx    Liver disease Neg Hx    Colon polyps Neg Hx    Rectal cancer Neg Hx     No Known Allergies  Medication list has been reviewed and updated.  Current Outpatient Medications on File Prior to Visit  Medication Sig Dispense Refill   amphetamine-dextroamphetamine (ADDERALL) 20 MG tablet Take 1 tablet (20 mg total) by mouth 2 (two) times daily. 60 tablet 0   aspirin 81 MG tablet Take 81 mg by mouth daily.     b complex vitamins tablet Take 0.5 tablets by mouth daily.     Biotin 5000 MCG CAPS Take 1 capsule by mouth daily.     Calcium-Magnesium-Zinc 167-83-8 MG TABS Take by mouth. 1 tablet daily     Cholecalciferol (VITAMIN D3) 2000 UNITS capsule Take 2,000 Units by mouth daily.     ferrous sulfate 324 (65 Fe) MG TBEC Take one every other day for low iron 45 tablet 3   Fluorouracil (TOLAK) 4 % CREA Total of 28 applications to the area  40 g 0   fluticasone (FLONASE) 50 MCG/ACT nasal spray SHAKE LIQUID AND USE 2 SPRAYS IN EACH NOSTRIL DAILY 16 g 11   glucosamine-chondroitin 500-400  MG tablet Take 1 tablet by mouth 2 (two) times daily.     losartan (COZAAR) 100 MG tablet Take 1 tablet (100 mg total) by mouth daily. 90 tablet 3   meloxicam (MOBIC) 15 MG tablet TAKE 1 TABLET(15 MG) BY MOUTH DAILY AS NEEDED 90 tablet 2   Multiple Vitamins-Minerals (MULTIVITAMIN WITH MINERALS) tablet Take 1 tablet by mouth daily.     NON FORMULARY ALJ -herbal decongestant  Takes as needed     omeprazole (PRILOSEC) 40 MG capsule Take 1 capsule (40 mg total) by mouth in the morning and at bedtime. 1803 capsule 3   rosuvastatin (CRESTOR) 20 MG tablet Take 1 tablet (20 mg total) by mouth daily. 90 tablet 3   sildenafil (REVATIO) 20 MG tablet Take 2-3 tablets one hour prior to intercourse 60 tablet 5   Zn-Pyg Afri-Nettle-Saw Palmet (SAW PALMETTO COMPLEX PO) Take 1 tablet by mouth 2 (two) times daily.     No current facility-administered medications on file prior to visit.    Review of Systems:  As per HPI- otherwise negative.   Physical Examination: Vitals:   06/01/22 0955  BP: (!) 152/82  Pulse: 78  Resp: 18  Temp: (!) 101.7 F (38.7 C)  SpO2: 95%   Vitals:   06/01/22 0955  Weight: 197 lb 3.2 oz (89.4 kg)  Height: 6\' 2"  (1.88 m)   Body mass index is 25.32 kg/m. Ideal Body Weight: Weight in (lb) to have BMI = 25: 194.3  GEN: no acute distress.  Overall looks well but is running a fever HEENT: Atraumatic, Normocephalic.  Bilateral TM wnl, oropharynx normal.  PEERL,EOMI.   Ears and Nose: No external deformity. CV: RRR, No M/G/R. No JVD. No thrill. No extra heart sounds. PULM: CTA B, no wheezes, crackles, rhonchi. No retractions. No resp. distress. No accessory muscle use. ABD: S, NT, ND, +BS. No rebound. No HSM. EXTR: No c/c/e PSYCH: Normally interactive. Conversant.  Results for orders placed or performed in visit on 06/01/22   POC COVID-19 BinaxNow  Result Value Ref Range   SARS Coronavirus 2 Ag Negative Negative    DG Chest 2 View  Result Date: 06/01/2022 CLINICAL DATA:  Intermittent fever and cough for 4 days. EXAM: CHEST - 2 VIEW COMPARISON:  09/22/2017 FINDINGS: Heart size and mediastinal contours appear normal. No pleural fluid or airspace disease. No signs of interstitial edema. Visualized osseous structures appear intact. IMPRESSION: No active cardiopulmonary disease. Electronically Signed   By: Signa Kell M.D.   On: 06/01/2022 10:53     Assessment and Plan: Fever, unspecified - Plan: DG Chest 2 View, POC COVID-19 BinaxNow, doxycycline (VIBRAMYCIN) 100 MG capsule  Acute cough - Plan: Basic metabolic panel, CBC, DG Chest 2 View, POC COVID-19 BinaxNow  Increased prostate specific antigen (PSA) velocity - Plan: PSA  Patient seen today for follow-up of acute illness.  He was seen in urgent care 2 days ago with fever, cough, all other testing negative.  He is prescribed azithromycin and Augmentin but has not actually started taking it yet Overall Jonathan Garrison looks better than expected for having a temperature, he states he is in no distress and does not wish to go to the ER Further lab work pending as above Repeat COVID, chest film negative Advised him to start Augmentin, will have him also take doxycycline as opposed to azithromycin to follow for any possibility of tickborne illness I have asked him to let me know how he is doing, contact me right away if not  improving or if getting worse  Signed Abbe Amsterdam, MD   Received labs as below, message to patient  Results for orders placed or performed in visit on 06/01/22  PSA  Result Value Ref Range   PSA 1.19 0.10 - 4.00 ng/mL  Basic metabolic panel  Result Value Ref Range   Sodium 139 135 - 145 mEq/L   Potassium 4.6 3.5 - 5.1 mEq/L   Chloride 102 96 - 112 mEq/L   CO2 27 19 - 32 mEq/L   Glucose, Bld 90 70 - 99 mg/dL   BUN 19 6 - 23 mg/dL    Creatinine, Ser 9.14 0.40 - 1.50 mg/dL   GFR 78.29 >56.21 mL/min   Calcium 9.2 8.4 - 10.5 mg/dL  CBC  Result Value Ref Range   WBC 8.3 4.0 - 10.5 K/uL   RBC 4.31 4.22 - 5.81 Mil/uL   Platelets 249.0 150.0 - 400.0 K/uL   Hemoglobin 13.6 13.0 - 17.0 g/dL   HCT 30.8 65.7 - 84.6 %   MCV 93.9 78.0 - 100.0 fl   MCHC 33.5 30.0 - 36.0 g/dL   RDW 96.2 95.2 - 84.1 %  POC COVID-19 BinaxNow  Result Value Ref Range   SARS Coronavirus 2 Ag Negative Negative

## 2022-05-31 NOTE — Telephone Encounter (Signed)
Please see the MyChart message reply(ies) for my assessment and plan.  The patient gave consent for this Medical Advice Message and is aware that it may result in a bill to their insurance company as well as the possibility that this may result in a co-payment or deductible. They are an established patient, but are not seeking medical advice exclusively about a problem treated during an in person or video visit in the last 7 days. I did not recommend an in person or video visit within 7 days of my reply.  I spent a total of 10 minutes cumulative time within 7 days through MyChart messaging Auriana Scalia, MD  

## 2022-06-01 ENCOUNTER — Encounter: Payer: Self-pay | Admitting: Family Medicine

## 2022-06-01 ENCOUNTER — Ambulatory Visit (INDEPENDENT_AMBULATORY_CARE_PROVIDER_SITE_OTHER): Payer: Medicare Other | Admitting: Family Medicine

## 2022-06-01 ENCOUNTER — Ambulatory Visit (HOSPITAL_BASED_OUTPATIENT_CLINIC_OR_DEPARTMENT_OTHER)
Admission: RE | Admit: 2022-06-01 | Discharge: 2022-06-01 | Disposition: A | Discharge status: Home / Self Care | Payer: Medicare Other | Source: Ambulatory Visit | Attending: Family Medicine | Admitting: Family Medicine

## 2022-06-01 VITALS — BP 152/82 | HR 78 | Temp 101.7°F | Resp 18 | Ht 74.0 in | Wt 197.2 lb

## 2022-06-01 DIAGNOSIS — R051 Acute cough: Secondary | ICD-10-CM

## 2022-06-01 DIAGNOSIS — R972 Elevated prostate specific antigen [PSA]: Secondary | ICD-10-CM | POA: Diagnosis not present

## 2022-06-01 DIAGNOSIS — R509 Fever, unspecified: Secondary | ICD-10-CM | POA: Diagnosis not present

## 2022-06-01 DIAGNOSIS — R059 Cough, unspecified: Secondary | ICD-10-CM | POA: Diagnosis not present

## 2022-06-01 LAB — POC COVID19 BINAXNOW: SARS Coronavirus 2 Ag: NEGATIVE

## 2022-06-01 LAB — CBC
HCT: 40.5 % (ref 39.0–52.0)
Hemoglobin: 13.6 g/dL (ref 13.0–17.0)
MCHC: 33.5 g/dL (ref 30.0–36.0)
MCV: 93.9 fl (ref 78.0–100.0)
Platelets: 249 10*3/uL (ref 150.0–400.0)
RBC: 4.31 Mil/uL (ref 4.22–5.81)
RDW: 13.7 % (ref 11.5–15.5)
WBC: 8.3 10*3/uL (ref 4.0–10.5)

## 2022-06-01 LAB — BASIC METABOLIC PANEL
BUN: 19 mg/dL (ref 6–23)
CO2: 27 mEq/L (ref 19–32)
Calcium: 9.2 mg/dL (ref 8.4–10.5)
Chloride: 102 mEq/L (ref 96–112)
Creatinine, Ser: 0.86 mg/dL (ref 0.40–1.50)
GFR: 84.51 mL/min (ref >60.00)
Glucose, Bld: 90 mg/dL (ref 70–99)
Potassium: 4.6 mEq/L (ref 3.5–5.1)
Sodium: 139 mEq/L (ref 135–145)

## 2022-06-01 LAB — PSA: PSA: 1.19 ng/mL (ref 0.10–4.00)

## 2022-06-01 MED ORDER — DOXYCYCLINE HYCLATE 100 MG PO CAPS: 100.0000 mg | ORAL_CAPSULE | Freq: Two times a day (BID) | ORAL | 0 refills | Status: DC

## 2022-06-01 NOTE — Patient Instructions (Signed)
Please go to lab and then to the ground floor imaging dept for a chest film I will be in touch with results asap Please start on the antibiotics today!

## 2022-06-01 NOTE — Telephone Encounter (Signed)
Per last lab results you wanted to recheck BUN and PSA 6 months from 03/23/22 OV.  He is coming in at 10am for UC follow up today. Is this okay to check today?

## 2022-06-07 ENCOUNTER — Encounter: Payer: Self-pay | Admitting: Physical Therapy

## 2022-06-07 ENCOUNTER — Ambulatory Visit (INDEPENDENT_AMBULATORY_CARE_PROVIDER_SITE_OTHER): Payer: Medicare Other | Admitting: Physical Therapy

## 2022-06-07 DIAGNOSIS — G8929 Other chronic pain: Secondary | ICD-10-CM | POA: Diagnosis not present

## 2022-06-07 DIAGNOSIS — R262 Difficulty in walking, not elsewhere classified: Secondary | ICD-10-CM

## 2022-06-07 DIAGNOSIS — M25552 Pain in left hip: Secondary | ICD-10-CM | POA: Diagnosis not present

## 2022-06-07 DIAGNOSIS — M6281 Muscle weakness (generalized): Secondary | ICD-10-CM | POA: Diagnosis not present

## 2022-06-07 DIAGNOSIS — M25561 Pain in right knee: Secondary | ICD-10-CM

## 2022-06-07 DIAGNOSIS — M25511 Pain in right shoulder: Secondary | ICD-10-CM

## 2022-06-07 DIAGNOSIS — M5459 Other low back pain: Secondary | ICD-10-CM

## 2022-06-07 NOTE — Therapy (Signed)
OUTPATIENT PHYSICAL THERAPY TREATMENT   Patient Name: Jonathan Garrison MRN: [id number] DOB:May 20, 1945, 77 y.o., male Today's Date: 06/07/2022  END OF SESSION:  PT End of Session - 06/07/22 1358     Visit Number 9    Number of Visits 12    Date for PT Re-Evaluation 06/09/22    Authorization Type MCR    Progress Note Due on Visit 16    PT Start Time 1350    PT Stop Time 1430    PT Time Calculation (min) 40 min    Activity Tolerance Patient tolerated treatment well    Behavior During Therapy WFL for tasks assessed/performed                Past Medical History:  Diagnosis Date   ADD (attention deficit disorder) without hyperactivity    Allergy    Anemia    no treatment   Anxiety    Arthralgia    many joints, managed with vitamins/herbs and ibuprofen   Arthritis    Cataract    Chest pain    Colitis 1986   single attack (believes was ulcerative)   Depression    Eczema    Esophageal stricture    GERD (gastroesophageal reflux disease)    Hemorrhoids    Lichen planus    Neuromuscular disorder (HCC)    POLIO AGE 71   Oral cancer (HCC) 05/2010   plans for surgery 06/2010   Osteoporosis    hips   Pleurisy 1984   Polio age 65   Umbilical hernia    Past Surgical History:  Procedure Laterality Date   COLONOSCOPY  2012   LEG SURGERY  when in 6th grade   L leg, bone spur removal (just above knee, laterally)   TONGUE SURGERY     for tongue cancer   UPPER GASTROINTESTINAL ENDOSCOPY     Patient Active Problem List   Diagnosis Date Noted   Hyperlipidemia 05/25/2022   Localized osteoarthritis of both shoulders 03/22/2022   Lower back pain 03/22/2022   Neck pain, chronic 03/22/2022   IT band syndrome 03/22/2022   Essential hypertension 02/22/2022   Family history of heart disease 02/22/2022   Trochanteric bursitis of left hip 08/01/2021   Pain of both hip joints 07/20/2021   Seasonal allergic rhinitis 05/24/2021   ETD (Eustachian tube dysfunction), bilateral  05/24/2021   Gastroesophageal reflux disease without esophagitis 12/15/2017   Dysphagia 12/15/2017   History of oral cancer 12/15/2017   Increased prostate specific antigen (PSA) velocity 11/27/2017   Osteopenia 08/01/2017   Vitamin B12 deficiency anemia 06/09/2014   Chest pain 03/13/2013   Depression with anxiety 08/26/2011   Overweight (BMI 25.0-29.9) 08/26/2011   Joint pain 08/26/2011   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 Rt knee pain, Rt shoulder sprain  Rationale for Evaluation and Treatment: Rehabilitation  THERAPY DIAG:  Other low back pain  Pain in left hip  Chronic pain of right knee  Muscle weakness (generalized)  Difficulty in walking, not elsewhere classified  Acute pain of right shoulder  ONSET DATE: chronic pain for years  SUBJECTIVE:  SUBJECTIVE STATEMENT: He relays he developed pnemonia so he is finishing up antibiotics to treat this. He gets more tired lately due to this. He relays 4/10 pain in Rt knee and 6/10 in left hip.   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 Yes: NPRS scale: 4-6/10 Pain location: back Pain description: ache Aggravating factors: stairs, worse first thing in the morning Relieving factors: meloxicam, voltaren  PRECAUTIONS: None  WEIGHT BEARING RESTRICTIONS: No  FALLS:  Has patient fallen in last 6 months? No  OCCUPATION: retired  PLOF: Independent  PATIENT GOALS: reduce pain,   NEXT MD VISIT:   OBJECTIVE:   DIAGNOSTIC FINDINGS:  Xray (right knee 4 views, left knee 4 views): Right knee with very mild degenerative  findings particularly about the medial joint space  PATIENT SURVEYS:  Eval: FOTO 67% functional   COGNITION: Overall cognitive status: Within functional limits for tasks assessed     SENSATION: WFL  MUSCLE LENGTH: Hamstrings: mildly tight at 80 deg bilat ITB tightness mildly on Rt  POSTURE:   PALPATION: Tender to palpation over Lt SIJ  LUMBAR ROM:   AROM eval  Flexion WNL  Extension WNL  Right lateral flexion WNL  Left lateral flexion WNL  Right rotation WNL  Left rotation WNL   (Blank rows = not tested)   ROM:     Active  Right eval Left eval Rt/Lt 04/28/22  Hip flexion [facility]   Hip extension     Hip abduction [facility]   Hip adduction     Hip internal rotation     Hip external rotation     Knee flexion [facility] WFL   Knee extension [facility]   Shoulder flexion   150  Shoulder abduction   155              (Blank rows = not tested)   MMT:    MMT Right eval Left eval Rt/Lt 04/28/22  Hip flexion 4+ 4+ 5/5  Hip extension     Hip abduction 4+ 4+ 4+/4+  Hip adduction     Hip internal rotation     Hip external rotation     Knee flexion 5 5 5/5  Knee extension 5- 5- 5/5  Ankle dorsiflexion 4 5 4/5       Shoulder flexion   5/5  Shoulder abduction   4+/5  Shoulder IR   5/5  Shoulder ER   4/4+   (Blank rows = not tested)  LUMBAR SPECIAL TESTS:  Eval: Straight leg raise test: Negative and Slump test: Negative Negative SIJ testing No pain with PAM testing to lumbar spine  FUNCTIONAL TESTS:  Eval: SLS X 10 seconds on left, 3-4 seconds on Rt  GAIT: Eval: Comments: independent community Ambulator, 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  TODAY'S TREATMENT:  06/07/22 -Nu step L6 UE/LE X 8 min -Single knee to chest stretch 30 sec X 2 bilat -Supine piriformis stretch 30 sec  X2 bilat knee to opposite shoulder and X 2 bilat figure 4 -Supine double SLR with isometric hold for core 12 sec X 2 -Supine alternate SLR 2X6 bilat -Supine hip abd/add raises 2X6 bilat -Seated  lumbar Pball roll outs for flexion stretch 6 sec hold X 12 reps, then feet on ball for DKTC stretch 6 sec X 12 -wall squats with back on ball  X12 reps -Rows with blue 2X12 -Extensions with blue 2X12  05/25/22 -Nu step L6 UE/LE X 8 min -Single  knee to chest stretch 30 sec X 2 bilat -Double knee to chest stretch 30 sec X 2 -Supine piriformis stretch 30 sec  X2 bilat -Supine hamstring stretch with strap 30 sec X 2 -Supine double SLR with isometric hold for core 20 sec X 2 -Single leg bridge 6 sec holds 2X 6 bilat -prone alternating opposite arm/leg lifts 6 reps X 2 sets each side -Quadriped leg extensions 2 X 6 reps bilat, cues for neutral spine and not to over rotate -Seated lumbar Pball roll outs for flexion stretch 6 sec hold X 12 reps, then feet on ball for DKTC stretch 6 sec X 12 -Sit to stands no UE support 2 X 6 reps -wall squats with back on ball  X12 reps  05/10/2422 -Nu step L6 UE/LE X 8 min -prone alternating opposite arm/leg lifts 6 reps X 2 sets each side -Prone bilat hip extensions 2 X 6 reps -Quadriped leg extensions 2 X 6 reps bilat, cues for neutral spine and not to over rotate -Sit to stands with 10# KB, no UE support 2 X 6 reps -Plank from knees and elbows 30-20 seconds X 6 reps -heel walking in bars and tandem walk 3 round trips with UE support -Leg press DL 161# [id number], then SL 45# X 12 bilat and one extra set of 6 reps for Rt   PATIENT EDUCATION: Education details: HEP, PT plan of care Person educated: Patient Education method: Explanation, Demonstration, Verbal cues, and Handouts Education comprehension: verbalized understanding and needs further education   HOME EXERCISE PROGRAM: Access Code: [email or web address] URL: [email or web address] Date: 05/10/2022 Prepared by: Ivery Quale  Exercises - Single Leg Bridge  - 2 x daily - 6 x weekly - 1-2 sets - 10 reps - 5 hold - Seated Hamstring Stretch  - 2 x daily - 6 x weekly - 1 sets - 2 reps - 30 hold -  Standing ITB Stretch  - 2 x daily - 6 x weekly - 2-3 sets - 30 hold - Heel Toe Raises with Counter Support  - 2 x daily - 6 x weekly - 2-3 sets - 10 reps - Single Leg Stance  - 2 x daily - 6 x weekly - 1 sets - 5 reps - 10 sec hold - Side Stepping with Resistance at Ankles  - 2 x daily - 6 x weekly - 2-3 sets - 10 reps - Sit to Stand Without Arm Support  - 2 x daily - 6 x weekly - 1-2 sets - 10 reps - Supine Active Straight Leg Raise  - 2 x daily - 6 x weekly - 1-2 sets - 12 reps - Sidelying Hip Abduction (Both Sides)  - 2 x daily - 6 x weekly - 1-2 sets - 12 reps - Prone Hip Extension  - 2 x daily - 6 x weekly - 1-2 sets - 12 reps - Bird Dog  - 2 x daily - 6 x weekly - 1 sets - 12 reps - 2 sec hold - Supine Piriformis Stretch with Foot on Ground  - 2 x daily - 6 x weekly - 2 sets - 30 hold - Tandem Walking  - 2 x daily - 6 x weekly - 3 sets - 12 reps - Shoulder External Rotation with Anchored Resistance  - 2 x daily - 6 x weekly - 2-3 sets - 12 reps - Shoulder External Rotation and Scapular Retraction with Resistance  - 2 x daily - 6 x weekly - 2-3 sets -  12 reps - Standing Shoulder Horizontal Abduction with Resistance  - 2 x daily - 6 x weekly - 2-3 sets - 10 reps - Standing Single Arm Shoulder Abduction with Resistance (Mirrored)  - 2 x daily - 6 x weekly - 2-3 sets - 12 reps - Heel Walking  - 2 x daily - 6 x weekly - 2-3 sets - 12 reps - Prone Alternating Arm and Leg Lifts  - 1 x daily - 3 x weekly - 1-2 sets - 12 reps - Prone Double Leg Lift  - 1 x daily - 3 x weekly - 1-2 sets - 12 reps - Quadruped Leg Lifts  - 1 x daily - 3 x weekly - 1-2 sets - 12 reps - Plank on Knees  - 1 x daily - 3 x weekly - 1 sets - 6 reps - 20-30 sec hold  ASSESSMENT:  CLINICAL IMPRESSION: His back pain had improved today but still with some pain in hip/knee. We modified his exercises some today due to fatigue and coughing when supine as he is recovering from Pneumonia.   OBJECTIVE IMPAIRMENTS: decreased  activity tolerance, difficulty walking, decreased balance, decreased endurance, decreased mobility, decreased ROM, decreased strength, impaired flexibility, impaired LE use, postural dysfunction, and pain.  ACTIVITY LIMITATIONS: bending, lifting, carry, locomotion, cleaning, community activity,  PERSONAL FACTORS: childhood polio, OA are also affecting patient's functional outcome.  REHAB POTENTIAL: Good  CLINICAL DECISION MAKING: Stable/uncomplicated  EVALUATION COMPLEXITY: Low    GOALS: Short term PT Goals Target date: 03/07/2022   Pt will be I and compliant with HEP. Baseline:  Goal status: MET 03/07/22 Pt will decrease pain by 25% overall Baseline: Goal status: MET 03/07/22  Long term PT goals Target date:06/09/22   Pt will improve  hip/knee strength to at least 5-/5 MMT to improve functional strength Baseline: Goal status: ongoing, 4 for DF 04/28/22 Pt will improve FOTO to at least 69% functional to show improved function Baseline: Goal status: ongoing 04/28/22 Pt will reduce pain to overall less than 2-3/10 with usual activity and kayaking Baseline: Goal status: ongoing 04/28/22, had set back with shoulder sprain kayacking Pt will be able to hold SLS X 10 seconds on avg for his Rt leg to show improved balance.  Baseline: 3-4 seconds on Rt Goal status: ongoing 04/28/22  PLAN: PT FREQUENCY: 1 times per 2 weeks  PT DURATION: 12 weeks  PLANNED INTERVENTIONS (unless contraindicated): aquatic PT, Canalith repositioning, cryotherapy, Electrical stimulation, Iontophoresis with 4 mg/ml dexamethasome, Moist heat, traction, Ultrasound, gait training, Therapeutic exercise, balance training, neuromuscular re-education, patient/family education, prosthetic training, manual techniques, passive ROM, dry needling, taping, vasopnuematic device, vestibular, spinal manipulations, joint manipulations  PLAN FOR NEXT SESSION: lumbar/core strength, leg strength progressions as tolerated.   April Manson, PT,DPT 06/07/2022, 1:59 PM

## 2022-06-09 ENCOUNTER — Ambulatory Visit (INDEPENDENT_AMBULATORY_CARE_PROVIDER_SITE_OTHER): Payer: Medicare Other | Admitting: Family Medicine

## 2022-06-09 VITALS — BP 130/63 | HR 83 | Temp 98.9°F | Resp 16 | Wt 196.0 lb

## 2022-06-09 DIAGNOSIS — R051 Acute cough: Secondary | ICD-10-CM | POA: Diagnosis not present

## 2022-06-09 DIAGNOSIS — R509 Fever, unspecified: Secondary | ICD-10-CM

## 2022-06-09 NOTE — Progress Notes (Signed)
[facility] [address], [address] [place], [place] [address] [phone]  Date:  06/09/2022   Name:  Jonathan Garrison   DOB:  1946-01-01   MRN:  [id number]  PCP:  Pearline Cables, MD    Chief Complaint: No chief complaint on file.   History of Present Illness:  Jonathan Garrison is a 77 y.o. very pleasant male patient who presents with the following:  Patient seen today to follow-up on recent illness Last seen by myself on May 8 at which time he had recently been to an urgent care with symptoms of illness and fever.  He had tested negative for COVID, flu and strep and had a chest x-ray.  He was prescribed Augmentin and Z-Pak for concern of possible pneumonia Saw him in the clinic 48 hours after his urgent care visit with concern of continued fevers, he had not yet actually started the antibiotics I retested him for COVID, still negative and instructed him to start antibiotics which he did-I did substitute doxycycline for azithromycin to cover any possibility of tick fever  He contacted me earlier this week requesting a visit to make sure all is well  Blood work from May 8, BMP normal and CBC normal Chest x-ray 5/8 negative He is finishing up his abx over the next couple of days Fever resolved since the day after he started abx   He was able to do his PT earlier this week and coughed quite a bit during exercise-however overall cough is improving He may feel a bit nauseated in the am but no vomiting Some loose stools but no diarrhea  He is able to eat okay Patient Active Problem List   Diagnosis Date Noted   Hyperlipidemia 05/25/2022   Localized osteoarthritis of both shoulders 03/22/2022   Lower back pain 03/22/2022   Neck pain, chronic 03/22/2022   IT band syndrome 03/22/2022   Essential hypertension 02/22/2022   Family history of heart disease 02/22/2022   Trochanteric bursitis of left hip 08/01/2021   Pain of both hip  joints 07/20/2021   Seasonal allergic rhinitis 05/24/2021   ETD (Eustachian tube dysfunction), bilateral 05/24/2021   Gastroesophageal reflux disease without esophagitis 12/15/2017   Dysphagia 12/15/2017   History of oral cancer 12/15/2017   Increased prostate specific antigen (PSA) velocity 11/27/2017   Osteopenia 08/01/2017   Vitamin B12 deficiency anemia 06/09/2014   Chest pain 03/13/2013   Depression with anxiety 08/26/2011   Overweight (BMI 25.0-29.9) 08/26/2011   Joint pain 08/26/2011   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   Oral cancer (HCC) 07/09/2010    Past Medical History:  Diagnosis Date   ADD (attention deficit disorder) without hyperactivity    Allergy    Anemia    no treatment   Anxiety    Arthralgia    many joints, managed with vitamins/herbs and ibuprofen   Arthritis    Cataract    Chest pain    Colitis 1986   single attack (believes was ulcerative)   Depression    Eczema    Esophageal stricture    GERD (gastroesophageal reflux disease)    Hemorrhoids    Lichen planus    Neuromuscular disorder (HCC)    POLIO AGE 23   Oral cancer (HCC) 05/2010   plans for surgery 06/2010   Osteoporosis    hips   Pleurisy 1984   Polio age 60   Umbilical hernia     Past Surgical  History:  Procedure Laterality Date   COLONOSCOPY  2012   LEG SURGERY  when in 6th grade   L leg, bone spur removal (just above knee, laterally)   TONGUE SURGERY     for tongue cancer   UPPER GASTROINTESTINAL ENDOSCOPY      Social History   Tobacco Use   Smoking status: Former    Types: Pipe, Cigarettes    Quit date: 01/25/1976    Years since quitting: 46.4   Smokeless tobacco: Never   Tobacco comments:    Quit smoking 35 years ago  Vaping Use   Vaping Use: Never used  Substance Use Topics   Alcohol use: Yes    Alcohol/week: 7.0 - 14.0 standard drinks of alcohol    Types: 7 - 14 Glasses of wine per week   Drug use: No    Family History  Problem Relation Age of Onset    Hypertension Mother    Diabetes Mother    Hyperlipidemia Mother    Hypertension Father    Benign prostatic hyperplasia Father    Heart disease Father    Lichen planus Sister    Cancer Sister        ?spot on chest? contained   Diabetes Maternal Grandfather    Colon cancer Neg Hx    Esophageal cancer Neg Hx    Stomach cancer Neg Hx    Pancreatic cancer Neg Hx    Liver disease Neg Hx    Colon polyps Neg Hx    Rectal cancer Neg Hx     No Known Allergies  Medication list has been reviewed and updated.  Current Outpatient Medications on File Prior to Visit  Medication Sig Dispense Refill   amphetamine-dextroamphetamine (ADDERALL) 20 MG tablet Take 1 tablet (20 mg total) by mouth 2 (two) times daily. 60 tablet 0   aspirin 81 MG tablet Take 81 mg by mouth daily.     b complex vitamins tablet Take 0.5 tablets by mouth daily.     Biotin 5000 MCG CAPS Take 1 capsule by mouth daily.     Calcium-Magnesium-Zinc 167-83-8 MG TABS Take by mouth. 1 tablet daily     Cholecalciferol (VITAMIN D3) 2000 UNITS capsule Take 2,000 Units by mouth daily.     doxycycline (VIBRAMYCIN) 100 MG capsule Take 1 capsule (100 mg total) by mouth 2 (two) times daily. 20 capsule 0   ferrous sulfate 324 (65 Fe) MG TBEC Take one every other day for low iron 45 tablet 3   fluticasone (FLONASE) 50 MCG/ACT nasal spray SHAKE LIQUID AND USE 2 SPRAYS IN EACH NOSTRIL DAILY 16 g 11   glucosamine-chondroitin 500-400 MG tablet Take 1 tablet by mouth 2 (two) times daily.     loratadine (CLARITIN) 10 MG tablet Take 10 mg by mouth daily.     losartan (COZAAR) 100 MG tablet Take 1 tablet (100 mg total) by mouth daily. 90 tablet 3   meloxicam (MOBIC) 15 MG tablet TAKE 1 TABLET(15 MG) BY MOUTH DAILY AS NEEDED 90 tablet 2   Multiple Vitamins-Minerals (MULTIVITAMIN WITH MINERALS) tablet Take 1 tablet by mouth daily.     NON FORMULARY ALJ -herbal decongestant  Takes as needed     omeprazole (PRILOSEC) 40 MG capsule Take 1 capsule  (40 mg total) by mouth in the morning and at bedtime. 1803 capsule 3   rosuvastatin (CRESTOR) 20 MG tablet Take 1 tablet (20 mg total) by mouth daily. 90 tablet 3   sildenafil (REVATIO) 20 MG tablet  Take 2-3 tablets one hour prior to intercourse 60 tablet 5   Zn-Pyg Afri-Nettle-Saw Palmet (SAW PALMETTO COMPLEX PO) Take 1 tablet by mouth 2 (two) times daily.     No current facility-administered medications on file prior to visit.    Review of Systems:  As per HPI- otherwise negative.   Physical Examination: Vitals:   06/09/22 1117 06/09/22 1120  BP: (!) 140/77 130/63  Pulse: 83   Resp: 16   Temp: 98.9 F (37.2 C)   SpO2: 100%    Vitals:   06/09/22 1117  Weight: 196 lb (88.9 kg)   Body mass index is 25.16 kg/m. Ideal Body Weight:    GEN: no acute distress.  Looks well, normal weight HEENT: Atraumatic, Normocephalic. Bilateral TM wnl, oropharynx normal.  PEERL,EOMI.   Ears and Nose: No external deformity. CV: RRR, No M/G/R. No JVD. No th.rill. No extra heart sounds. PULM: CTA B, no wheezes, crackles, rhonchi. No retractions. No resp. distress. No accessory muscle use. ABD: S, NT, ND, +BS. No rebound. No HSM. EXTR: No c/c/e PSYCH: Normally interactive. Conversant.  Lungs are clear though he is coughing occasionally He notes appearance of some hypopigmented macules on his right knee, consistent with vitiligo Assessment and Plan: Acute cough  Fever, unspecified  Patient seen today for follow-up.  I recently saw him with fever and cough, chest x-ray clear-treated with Augmentin and doxycycline.  Patient notes he seemed to respond rapidly to antibiotics and is feeling much better, though he is still coughing some. I asked him to please let me know if he is not continuing to improve.  We expect he will do well  Signed Abbe Amsterdam, MD

## 2022-06-09 NOTE — Patient Instructions (Signed)
It was good to see you today- I am glad you are getting better. Please let me know if you are not continuing to get better   I might suggest seeing Dr Leonie Man, Dr Roderic Scarce at [facility]- however really any of their docs are very good

## 2022-06-15 ENCOUNTER — Encounter: Payer: Self-pay | Admitting: Family Medicine

## 2022-06-16 NOTE — Telephone Encounter (Signed)
Please see the MyChart message reply(ies) for my assessment and plan.  The patient gave consent for this Medical Advice Message and is aware that it may result in a bill to their insurance company as well as the possibility that this may result in a co-payment or deductible. They are an established patient, but are not seeking medical advice exclusively about a problem treated during an in person or video visit in the last 7 days. I did not recommend an in person or video visit within 7 days of my reply.  I spent a total of 10 minutes cumulative time within 7 days through MyChart messaging Carrisa Keller, MD  

## 2022-06-21 ENCOUNTER — Encounter: Payer: Self-pay | Admitting: Physical Therapy

## 2022-06-21 ENCOUNTER — Ambulatory Visit (INDEPENDENT_AMBULATORY_CARE_PROVIDER_SITE_OTHER): Payer: Medicare Other | Admitting: Physical Therapy

## 2022-06-21 DIAGNOSIS — M25561 Pain in right knee: Secondary | ICD-10-CM

## 2022-06-21 DIAGNOSIS — M25552 Pain in left hip: Secondary | ICD-10-CM | POA: Diagnosis not present

## 2022-06-21 DIAGNOSIS — M25511 Pain in right shoulder: Secondary | ICD-10-CM | POA: Diagnosis not present

## 2022-06-21 DIAGNOSIS — R262 Difficulty in walking, not elsewhere classified: Secondary | ICD-10-CM | POA: Diagnosis not present

## 2022-06-21 DIAGNOSIS — G8929 Other chronic pain: Secondary | ICD-10-CM

## 2022-06-21 DIAGNOSIS — M5459 Other low back pain: Secondary | ICD-10-CM | POA: Diagnosis not present

## 2022-06-21 NOTE — Telephone Encounter (Signed)
The only open spots are the 10, 11:40, and 3pm for both Wednesday and Thursday.

## 2022-06-21 NOTE — Therapy (Signed)
OUTPATIENT PHYSICAL THERAPY TREATMENT/RECERT   Patient Name: Jonathan Garrison MRN: [id number] DOB:May 12, 1945, 77 y.o., male Today's Date: 06/21/2022  END OF SESSION:  PT End of Session - 06/21/22 1351     Visit Number 10    Number of Visits 16    Date for PT Re-Evaluation 09/13/22    Authorization Type MCR    Progress Note Due on Visit 16    PT Start Time 1345    PT Stop Time 1428    PT Time Calculation (min) 43 min    Activity Tolerance Patient tolerated treatment well    Behavior During Therapy WFL for tasks assessed/performed                Past Medical History:  Diagnosis Date   ADD (attention deficit disorder) without hyperactivity    Allergy    Anemia    no treatment   Anxiety    Arthralgia    many joints, managed with vitamins/herbs and ibuprofen   Arthritis    Cataract    Chest pain    Colitis 1986   single attack (believes was ulcerative)   Depression    Eczema    Esophageal stricture    GERD (gastroesophageal reflux disease)    Hemorrhoids    Lichen planus    Neuromuscular disorder (HCC)    POLIO AGE 35   Oral cancer (HCC) 05/2010   plans for surgery 06/2010   Osteoporosis    hips   Pleurisy 1984   Polio age 45   Umbilical hernia    Past Surgical History:  Procedure Laterality Date   COLONOSCOPY  2012   LEG SURGERY  when in 6th grade   L leg, bone spur removal (just above knee, laterally)   TONGUE SURGERY     for tongue cancer   UPPER GASTROINTESTINAL ENDOSCOPY     Patient Active Problem List   Diagnosis Date Noted   Hyperlipidemia 05/25/2022   Localized osteoarthritis of both shoulders 03/22/2022   Lower back pain 03/22/2022   Neck pain, chronic 03/22/2022   IT band syndrome 03/22/2022   Essential hypertension 02/22/2022   Family history of heart disease 02/22/2022   Trochanteric bursitis of left hip 08/01/2021   Pain of both hip joints 07/20/2021   Seasonal allergic rhinitis 05/24/2021   ETD (Eustachian tube dysfunction),  bilateral 05/24/2021   Gastroesophageal reflux disease without esophagitis 12/15/2017   Dysphagia 12/15/2017   History of oral cancer 12/15/2017   Increased prostate specific antigen (PSA) velocity 11/27/2017   Osteopenia 08/01/2017   Vitamin B12 deficiency anemia 06/09/2014   Chest pain 03/13/2013   Depression with anxiety 08/26/2011   Overweight (BMI 25.0-29.9) 08/26/2011   Joint pain 08/26/2011   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 Rt knee pain, Rt shoulder sprain  Rationale for Evaluation and Treatment: Rehabilitation  THERAPY DIAG:  Other low back pain  Pain in left hip  Chronic pain of right knee  Difficulty in walking, not elsewhere classified  Acute pain of right shoulder  ONSET DATE: chronic pain for years  SUBJECTIVE:  SUBJECTIVE STATEMENT: He relays his knees are doing good overall but having more back pain today around 8/10  PERTINENT HISTORY:  Polio as child, OA  PAIN:  Are you having pain? Yes: NPRS scale: 8/10 Pain location: back Pain description: ache Aggravating factors: stairs, worse first thing in the morning Relieving factors: meloxicam, voltaren  PRECAUTIONS: None  WEIGHT BEARING RESTRICTIONS: No  FALLS:  Has patient fallen in last 6 months? No  OCCUPATION: retired  PLOF: Independent  PATIENT GOALS: reduce pain,   NEXT MD VISIT:   OBJECTIVE:   DIAGNOSTIC FINDINGS:  Xray (right knee 4 views, left knee 4 views): Right knee with very mild degenerative findings particularly about the medial joint space  PATIENT SURVEYS:  Eval: FOTO 67% functional    COGNITION: Overall cognitive status: Within functional limits for tasks assessed     SENSATION: WFL  MUSCLE LENGTH: Hamstrings: mildly tight at 80 deg bilat ITB tightness mildly on Rt  POSTURE:   PALPATION: Tender to palpation over Lt SIJ  LUMBAR ROM:   AROM eval  Flexion WNL  Extension WNL  Right lateral flexion WNL  Left lateral flexion WNL  Right rotation WNL  Left rotation WNL   (Blank rows = not tested)   ROM:     Active  Right eval Left eval Rt/Lt 04/28/22  Hip flexion [facility]   Hip extension     Hip abduction [facility]   Hip adduction     Hip internal rotation     Hip external rotation     Knee flexion [facility] WFL   Knee extension [facility]   Shoulder flexion   150  Shoulder abduction   155              (Blank rows = not tested)   MMT:    MMT Right eval Left eval Rt/Lt 04/28/22 Rt/Lt  Hip flexion 4+ 4+ 5/5 5/5  Hip extension      Hip abduction 4+ 4+ 4+/4+ 4+/4+  Hip adduction      Hip internal rotation      Hip external rotation      Knee flexion 5 5 5/5   Knee extension 5- 5- 5/5   Ankle dorsiflexion 4 5 4/5 4+/5        Shoulder flexion   5/5   Shoulder abduction   4+/5   Shoulder IR   5/5   Shoulder ER   4/4+    (Blank rows = not tested)  LUMBAR SPECIAL TESTS:  Eval: Straight leg raise test: Negative and Slump test: Negative Negative SIJ testing No pain with PAM testing to lumbar spine  FUNCTIONAL TESTS:  Eval: SLS X 10 seconds on left, 3-4 seconds on Rt 06/21/22: SLS 6 sec avg on Rt  GAIT: Eval: Comments: independent community Ambulator, 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  TODAY'S TREATMENT:  06/21/22 -recumbent bike L5 X 8 min -TRX squats and push ups 2X12 -Seated lumbar Pball roll outs for flexion stretch 6 sec hold X 12 reps -Rows with blue 2X12 -Extensions with blue 2X12 -Anti-rotation blue 2X12 bilat -Leg press DL [id number] -Rocker board A-P 2 min -Single leg balance 12 sec X 6 bilat -Tandem walk on foam beam X 6   -Sidestepping on foam beam X 6   06/07/22 -Nu step L6 UE/LE X 8 min -Single knee to chest stretch 30 sec X 2 bilat -Supine piriformis stretch 30 sec  X2 bilat knee to opposite shoulder and X 2 bilat figure  4 -Supine double SLR with isometric hold for core 12 sec X 2 -Supine alternate SLR 2X6 bilat -Supine hip abd/add raises 2X6 bilat -Seated lumbar Pball roll outs for flexion stretch 6 sec hold X 12 reps, then feet on ball for DKTC stretch 6 sec X 12 -wall squats with back on ball  X12 reps -Rows with blue 2X12 -Extensions with blue 2X12  05/25/22 -Nu step L6 UE/LE X 8 min -Single knee to chest stretch 30 sec X 2 bilat -Double knee to chest stretch 30 sec X 2 -Supine piriformis stretch 30 sec  X2 bilat -Supine hamstring stretch with strap 30 sec X 2 -Supine double SLR with isometric hold for core 20 sec X 2 -Single leg bridge 6 sec holds 2X 6 bilat -prone alternating opposite arm/leg lifts 6 reps X 2 sets each side -Quadriped leg extensions 2 X 6 reps bilat, cues for neutral spine and not to over rotate -Seated lumbar Pball roll outs for flexion stretch 6 sec hold X 12 reps, then feet on ball for DKTC stretch 6 sec X 12 -Sit to stands no UE support 2 X 6 reps -wall squats with back on ball  X12 reps     PATIENT EDUCATION: Education details: HEP, PT plan of care Person educated: Patient Education method: Explanation, Demonstration, Verbal cues, and Handouts Education comprehension: verbalized understanding and needs further education   HOME EXERCISE PROGRAM: Access Code: [email or web address] URL: [email or web address] Date: 05/10/2022 Prepared by: Ivery Quale  Exercises - Single Leg Bridge  - 2 x daily - 6 x weekly - 1-2 sets - 10 reps - 5 hold - Seated Hamstring Stretch  - 2 x daily - 6 x weekly - 1 sets - 2 reps - 30 hold - Standing ITB Stretch  - 2 x daily - 6 x weekly - 2-3 sets - 30 hold - Heel Toe Raises with Counter Support  - 2 x daily - 6 x weekly - 2-3 sets  - 10 reps - Single Leg Stance  - 2 x daily - 6 x weekly - 1 sets - 5 reps - 10 sec hold - Side Stepping with Resistance at Ankles  - 2 x daily - 6 x weekly - 2-3 sets - 10 reps - Sit to Stand Without Arm Support  - 2 x daily - 6 x weekly - 1-2 sets - 10 reps - Supine Active Straight Leg Raise  - 2 x daily - 6 x weekly - 1-2 sets - 12 reps - Sidelying Hip Abduction (Both Sides)  - 2 x daily - 6 x weekly - 1-2 sets - 12 reps - Prone Hip Extension  - 2 x daily - 6 x weekly - 1-2 sets - 12 reps - Bird Dog  - 2 x daily - 6 x weekly - 1 sets - 12 reps - 2 sec hold - Supine Piriformis Stretch with Foot on Ground  - 2 x daily - 6 x weekly - 2 sets - 30 hold - Tandem Walking  - 2 x daily - 6 x weekly - 3 sets - 12 reps - Shoulder External Rotation with Anchored Resistance  - 2 x daily - 6 x weekly - 2-3 sets - 12 reps - Shoulder External Rotation and Scapular Retraction with Resistance  - 2 x daily - 6 x weekly - 2-3 sets - 12 reps - Standing Shoulder Horizontal Abduction with Resistance  - 2 x daily - 6 x weekly - 2-3 sets - 10 reps -  Standing Single Arm Shoulder Abduction with Resistance (Mirrored)  - 2 x daily - 6 x weekly - 2-3 sets - 12 reps - Heel Walking  - 2 x daily - 6 x weekly - 2-3 sets - 12 reps - Prone Alternating Arm and Leg Lifts  - 1 x daily - 3 x weekly - 1-2 sets - 12 reps - Prone Double Leg Lift  - 1 x daily - 3 x weekly - 1-2 sets - 12 reps - Quadruped Leg Lifts  - 1 x daily - 3 x weekly - 1-2 sets - 12 reps - Plank on Knees  - 1 x daily - 3 x weekly - 1 sets - 6 reps - 20-30 sec hold  ASSESSMENT:  CLINICAL IMPRESSION: Recert today as PT plan of care had expired. He has showed improvements toward PT goals but still has not yet met them and PT recommending to continue every 2 weeks while doing HEP in the meantime to continue to build strength and balance to improve function.   OBJECTIVE IMPAIRMENTS: decreased activity tolerance, difficulty walking, decreased balance, decreased  endurance, decreased mobility, decreased ROM, decreased strength, impaired flexibility, impaired LE use, postural dysfunction, and pain.  ACTIVITY LIMITATIONS: bending, lifting, carry, locomotion, cleaning, community activity,  PERSONAL FACTORS: childhood polio, OA are also affecting patient's functional outcome.  REHAB POTENTIAL: Good  CLINICAL DECISION MAKING: Stable/uncomplicated  EVALUATION COMPLEXITY: Low    GOALS: Short term PT Goals Target date: 03/07/2022   Pt will be I and compliant with HEP. Baseline:  Goal status: MET 03/07/22 Pt will decrease pain by 25% overall Baseline: Goal status: MET 03/07/22  Long term PT goals Target date:09/13/22   Pt will improve  hip/knee strength to at least 5-/5 MMT to improve functional strength Baseline: Goal status: ongoing, 4 to 4+ for Rt ankle DF and hip abd on avg 06/21/22 Pt will improve FOTO to at least 69% functional to show improved function Baseline: Goal status: DC this goal as foto was closed out. Pt will reduce pain to overall less than 2-3/10 with usual activity and kayaking Baseline: Goal status: ongoing 06/21/22, has been having more back pain  4. Pt will be able to hold SLS X 10 seconds on avg for his Rt leg to show improved balance.  Baseline: 3-4 seconds on Rt Goal status: ongoing 06/21/22  PLAN: PT FREQUENCY: 1 times per 2 weeks  PT DURATION: 12 weeks  PLANNED INTERVENTIONS (unless contraindicated): aquatic PT, Canalith repositioning, cryotherapy, Electrical stimulation, Iontophoresis with 4 mg/ml dexamethasome, Moist heat, traction, Ultrasound, gait training, Therapeutic exercise, balance training, neuromuscular re-education, patient/family education, prosthetic training, manual techniques, passive ROM, dry needling, taping, vasopnuematic device, vestibular, spinal manipulations, joint manipulations  PLAN FOR NEXT SESSION: lumbar/core strength, leg strength progressions as tolerated.   April Manson,  PT,DPT 06/21/2022, 1:55 PM

## 2022-06-23 ENCOUNTER — Ambulatory Visit (INDEPENDENT_AMBULATORY_CARE_PROVIDER_SITE_OTHER): Payer: Medicare Other | Admitting: Internal Medicine

## 2022-06-23 ENCOUNTER — Encounter: Payer: Self-pay | Admitting: Internal Medicine

## 2022-06-23 VITALS — BP 136/70 | HR 93 | Ht 74.0 in | Wt 200.0 lb

## 2022-06-23 DIAGNOSIS — R1319 Other dysphagia: Secondary | ICD-10-CM

## 2022-06-23 DIAGNOSIS — K222 Esophageal obstruction: Secondary | ICD-10-CM

## 2022-06-23 DIAGNOSIS — K219 Gastro-esophageal reflux disease without esophagitis: Secondary | ICD-10-CM | POA: Diagnosis not present

## 2022-06-23 MED ORDER — OMEPRAZOLE 40 MG PO CPDR: 40.0000 mg | DELAYED_RELEASE_CAPSULE | Freq: Two times a day (BID) | ORAL | 3 refills | Status: DC

## 2022-06-23 NOTE — Patient Instructions (Signed)
We have sent the following medications to your pharmacy for you to pick up at your convenience:  Omeprazole.  Please follow up in one year.  _______________________________________________________  If your blood pressure at your visit was 140/90 or greater, please contact your primary care physician to follow up on this.  _______________________________________________________  If you are age 76 or older, your body mass index should be between 23-30. Your Body mass index is 25.68 kg/m. If this is out of the aforementioned range listed, please consider follow up with your Primary Care Provider.  If you are age 27 or younger, your body mass index should be between 19-25. Your Body mass index is 25.68 kg/m. If this is out of the aformentioned range listed, please consider follow up with your Primary Care Provider.   ________________________________________________________  [facility] providers would like to encourage you to use [facility] to communicate with providers for non-urgent requests or questions.  Due to long hold times on the telephone, sending your provider a message by [facility] may be a faster and more efficient way to get a response.  Please allow 48 business hours for a response.  Please remember that this is for non-urgent requests.  _______________________________________________________

## 2022-06-23 NOTE — Progress Notes (Signed)
HISTORY OF PRESENT ILLNESS:  Jonathan Garrison is a 77 y.o. 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  He was last seen in this office March 2 2023rd for recurrent dysphagia.  He subsequently underwent upper endoscopy May 07, 2021.  He was found to have significant stricturing of the distal esophagus which was balloon dilated to 20 mm.  He is continued on omeprazole 40 mg daily and 40 mg twice daily for breakthrough symptoms.  Presents today for follow-up.  He tells me that over the winter he was having creasing reflux symptoms as well as recurrent dysphagia.  He increased his PPI to twice daily.  Both his reflux symptoms and dysphagia resolved.  He is doing well now and back on once daily therapy.  He has no new problems or complaints.  He is aged out of colon cancer screening.  His last colonoscopy April 2022.  Review of blood work: May 2024.  Unremarkable basic metabolic panel.  Normal CBC with hemoglobin 13.6  REVIEW OF SYSTEMS:  All non-GI ROS negative unless otherwise stated in the HPI except for arthritis, anxiety, back pain, cough, hip pain, fatigue, headaches, hearing problems,  Past Medical History:  Diagnosis Date   ADD (attention deficit disorder) without hyperactivity    Allergy    Anemia    no treatment   Anxiety    Arthralgia    many joints, managed with vitamins/herbs and ibuprofen   Arthritis    Cataract    Chest pain    Colitis 1986   single attack (believes was ulcerative)   Depression    Eczema    Esophageal stricture    GERD (gastroesophageal reflux disease)    Hemorrhoids    Lichen planus    Neuromuscular disorder (HCC)    POLIO AGE 85   Oral cancer (HCC) 05/2010   plans for surgery 06/2010   Osteoporosis    hips   Pleurisy 1984   Polio age 29   Umbilical hernia     Past Surgical History:  Procedure Laterality Date    COLONOSCOPY  2012   LEG SURGERY  when in 6th grade   L leg, bone spur removal (just above knee, laterally)   TONGUE SURGERY     for tongue cancer   UPPER GASTROINTESTINAL ENDOSCOPY      Social History Angeles Koellner  reports that he quit smoking about 46 years ago. His smoking use included pipe and cigarettes. He has never used smokeless tobacco. He reports current alcohol use of about 7.0 - 14.0 standard drinks of alcohol per week. He reports that he does not use drugs.  family history includes Benign prostatic hyperplasia in his father; Cancer in his sister; Diabetes in his maternal grandfather and mother; Heart disease in his father; Hyperlipidemia in his mother; Hypertension in his father and mother; Lichen planus in his sister.  No Known Allergies     PHYSICAL EXAMINATION: Vital signs: BP 136/70   Pulse 93   Ht 6\' 2"  (1.88 m)   Wt 200 lb (90.7 kg)   BMI 25.68 kg/m   Constitutional: generally well-appearing, no acute distress Psychiatric: alert and oriented x3, cooperative Eyes: extraocular movements intact, anicteric, conjunctiva pink Mouth: oral pharynx moist, no lesions Neck: supple no lymphadenopathy Cardiovascular: heart regular rate and rhythm, no murmur Lungs: clear to auscultation bilaterally Abdomen: soft, nontender, nondistended, no obvious ascites, no peritoneal signs, normal  bowel sounds, no organomegaly Rectal: Omitted Extremities: no clubbing, cyanosis, or lower extremity edema bilaterally Skin: no lesions on visible extremities Neuro: No focal deficits.  Cranial nerves intact  ASSESSMENT:  1.  GERD complicated by erosive esophagitis and peptic stricture.  Currently asymptomatic post dilation on PPI.  Last endoscopy April 2023 2.  Colon cancer screening.  Up-to-date and aged out.  Last exam 2022   PLAN:  1.  Reflux precautions 2.  Continue omeprazole 40 mg p.o. twice daily.  Medication refilled.  Medication risks reviewed. 3.  Routine office follow-up 1  year.  Sooner if needed.  He agrees.

## 2022-06-24 ENCOUNTER — Encounter: Payer: Self-pay | Admitting: Family Medicine

## 2022-06-25 DIAGNOSIS — R519 Headache, unspecified: Secondary | ICD-10-CM | POA: Diagnosis not present

## 2022-06-25 DIAGNOSIS — Z20822 Contact with and (suspected) exposure to covid-19: Secondary | ICD-10-CM | POA: Diagnosis not present

## 2022-06-25 DIAGNOSIS — J069 Acute upper respiratory infection, unspecified: Secondary | ICD-10-CM | POA: Diagnosis not present

## 2022-06-25 DIAGNOSIS — R059 Cough, unspecified: Secondary | ICD-10-CM | POA: Diagnosis not present

## 2022-06-25 NOTE — Patient Instructions (Signed)
It was good to see you today, I will be in touch with your labs as they come in!

## 2022-06-25 NOTE — Progress Notes (Unsigned)
[facility] [address] [place], [place] [address] [phone]  Date:  06/27/2022   Name:  Jonathan Garrison   DOB:  01-18-1946   MRN:  [id number]  PCP:  Pearline Cables, MD    Chief Complaint: sti check (Concerns/ questions: pt says he is also feeling like he has another URI, some nausea, dizziness, cough, red eye with some weeping, some myalgias)   History of Present Illness:  Jonathan Garrison is a 77 y.o. very pleasant male patient who presents with the following:  Patient seen today with concern of STI screening.  He has a new partner who requested that he be screened He is not aware of any previous STI infection or any particular exposure Most recent visit with myself was last month He was ill last month also with a febrile illness, he did recover fully History of anxiety, depression, oral cancer, osteopenia, B12 deficiency, and GERD.  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   However, he was then in urgent care again 2 days ago, 06/25/2022 again with cough, myalgias, headache, low-grade fever. Prior to getting sick he was again at a music event and was exposed to a large group of people He tested negative for COVID, flu, strep at [facility] - he was not treated with any rx meds He had a low grade fever to 99.6 this am He did have meloxicam this am  Overall he does not feel that his symptoms are severe, he feels like he is somewhat getting better.  However, he is concerned about how he did last time and would like to have a chest x-ray  He is tolerating crestor well   Pulse Readings from Last 3 Encounters:  06/27/22  94  06/23/22 93  06/09/22 83   He notes his pulse can sometimes be elevated esp after exercise -for example, [facility] 110 [facility].  I advised his pulse is typically been normal here at the med center so it is difficult for me to comment, certainly I am glad to order a Zio patch if he would like.  He declines for the time being  Patient Active Problem List   Diagnosis Date Noted   Hyperlipidemia 05/25/2022   Localized osteoarthritis of both shoulders 03/22/2022   Lower back pain 03/22/2022   Neck pain, chronic 03/22/2022   IT band syndrome 03/22/2022   Essential hypertension 02/22/2022   Family history of heart disease 02/22/2022   Trochanteric bursitis of left hip 08/01/2021   Pain of both hip joints 07/20/2021   Seasonal allergic rhinitis 05/24/2021   ETD (Eustachian tube dysfunction), bilateral 05/24/2021   Gastroesophageal reflux disease without esophagitis 12/15/2017   Dysphagia 12/15/2017   History of oral cancer 12/15/2017   Increased prostate specific antigen (PSA) velocity 11/27/2017   Osteopenia 08/01/2017   Vitamin B12 deficiency anemia 06/09/2014   Chest pain 03/13/2013   Depression with anxiety 08/26/2011   Overweight (BMI 25.0-29.9) 08/26/2011   Joint pain 08/26/2011   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  Oral cancer (HCC) 07/09/2010    Past Medical History:  Diagnosis Date   ADD (attention deficit disorder) without hyperactivity    Allergy    Anemia    no treatment   Anxiety    Arthralgia    many joints, managed with vitamins/herbs and ibuprofen   Arthritis    Cataract    Chest pain    Colitis 1986   single attack (believes was ulcerative)   Depression    Eczema    Esophageal stricture    GERD (gastroesophageal reflux disease)    Hemorrhoids    Lichen planus    Neuromuscular disorder (HCC)    POLIO AGE 11   Oral cancer (HCC) 05/2010   plans for surgery 06/2010   Osteoporosis    hips   Pleurisy  1984   Polio age 70   Umbilical hernia     Past Surgical History:  Procedure Laterality Date   COLONOSCOPY  2012   LEG SURGERY  when in 6th grade   L leg, bone spur removal (just above knee, laterally)   TONGUE SURGERY     for tongue cancer   UPPER GASTROINTESTINAL ENDOSCOPY      Social History   Tobacco Use   Smoking status: Former    Types: Pipe, Cigarettes    Quit date: 01/25/1976    Years since quitting: 46.4   Smokeless tobacco: Never   Tobacco comments:    Quit smoking 35 years ago  Vaping Use   Vaping Use: Never used  Substance Use Topics   Alcohol use: Yes    Alcohol/week: 7.0 - 14.0 standard drinks of alcohol    Types: 7 - 14 Glasses of wine per week   Drug use: No       Review of Systems:  As per HPI- otherwise negative. BP Readings from Last 3 Encounters:  06/27/22 (!) 152/70  06/23/22 136/70  06/09/22 130/63     Physical Examination: Vitals:   06/27/22 1340  BP: (!) 152/70  Pulse: 94  Resp: 18  Temp: 98.5 F (36.9 C)  SpO2: 95%   Vitals:   06/27/22 1340  Weight: 198 lb 9.6 oz (90.1 kg)  Height: 6\' 2"  (1.88 m)   Body mass index is 25.5 kg/m. Ideal Body Weight: Weight in (lb) to have BMI = 25: 194.3  GEN: no acute distress.  Normal weight, looks well HEENT: Atraumatic, Normocephalic.  Bilateral TM wnl, oropharynx normal.  PEERL,EOMI.   Ears and Nose: No external deformity. CV: RRR, No M/G/R. No JVD. No thrill. No extra heart sounds. PULM: CTA B, no wheezes, crackles, rhonchi. No retractions. No resp. distress. No accessory muscle use. ABD: S, NT, ND, +BS. No rebound. No HSM. EXTR: No c/c/e PSYCH: Normally interactive. Conversant.   Results for orders placed or performed in visit on 06/27/22  POCT urinalysis dipstick  Result Value Ref Range   Color, UA yellow yellow   Clarity, UA clear clear   Glucose, UA negative negative mg/dL   Bilirubin, UA negative negative   Ketones, POC UA negative negative mg/dL   Spec Grav, UA 1.610  1.010 - 1.025   Blood, UA negative negative   pH, UA 6.0 5.0 - 8.0   Protein Ur, POC negative negative mg/dL   Urobilinogen, UA 0.2 0.2 or 1.0 E.U./dL   Nitrite, UA Negative Negative   Leukocytes, UA Negative Negative    Assessment and Plan: Routine screening for STI (sexually transmitted infection) - Plan: Hepatitis B surface antigen, Hepatitis  C antibody, HIV Antibody (routine testing w rflx), RPR, HSV(herpes simplex vrs) 1+2 ab-IgG, Cervicovaginal ancillary only( [place])  Subacute cough - Plan: DG Chest 2 View  High risk sexual behavior, unspecified type - Plan: HIV Antibody (routine testing w rflx)  Exposure to blood or body fluid - Plan: HIV Antibody (routine testing w rflx)  Urinary frequency - Plan: Urine Culture, POCT urinalysis dipstick  Other problems related to lifestyle - Plan: HIV Antibody (routine testing w rflx) Seen today for routine STI screening, pending as above He also notes occasional urinary frequency, urine culture pending He was sick last month with possible pneumonia, recovered but then became mildly ill again the last 2 or 3 days.  Will repeat a chest x-ray today, he agrees to closely monitor his symptoms  Signed Abbe Amsterdam, MD  Received chest film as below- message to pt  DG Chest 2 View  Result Date: 06/27/2022 CLINICAL DATA:  Cough and low-grade fever. EXAM: CHEST - 2 VIEW COMPARISON:  06/01/2022 and prior studies FINDINGS: The cardiomediastinal silhouette is unremarkable. There is no evidence of focal airspace disease, pulmonary edema, suspicious pulmonary nodule/mass, pleural effusion, or pneumothorax. No acute bony abnormalities are identified. IMPRESSION: No active cardiopulmonary disease. Electronically Signed   By: Harmon Pier M.D.   On: 06/27/2022 15:10     No sign of pneumonia.  I might suggest hold off on antibiotics and give it a couple of days- however PLEASE do contact me if you continue to have any temps over 99 or so

## 2022-06-27 ENCOUNTER — Encounter: Payer: Self-pay | Admitting: Family Medicine

## 2022-06-27 ENCOUNTER — Other Ambulatory Visit (HOSPITAL_COMMUNITY)
Admission: RE | Admit: 2022-06-27 | Discharge: 2022-06-27 | Disposition: A | Discharge status: Home / Self Care | Payer: Medicare Other | Source: Ambulatory Visit

## 2022-06-27 ENCOUNTER — Ambulatory Visit (INDEPENDENT_AMBULATORY_CARE_PROVIDER_SITE_OTHER): Payer: Medicare Other | Admitting: Family Medicine

## 2022-06-27 ENCOUNTER — Ambulatory Visit (HOSPITAL_BASED_OUTPATIENT_CLINIC_OR_DEPARTMENT_OTHER)
Admission: RE | Admit: 2022-06-27 | Discharge: 2022-06-27 | Disposition: A | Discharge status: Home / Self Care | Payer: Medicare Other | Source: Ambulatory Visit | Attending: Family Medicine | Admitting: Family Medicine

## 2022-06-27 VITALS — BP 152/70 | HR 94 | Temp 98.5°F | Resp 18 | Ht 74.0 in | Wt 198.6 lb

## 2022-06-27 DIAGNOSIS — Z113 Encounter for screening for infections with a predominantly sexual mode of transmission: Secondary | ICD-10-CM | POA: Diagnosis not present

## 2022-06-27 DIAGNOSIS — Z7251 High risk heterosexual behavior: Secondary | ICD-10-CM | POA: Diagnosis not present

## 2022-06-27 DIAGNOSIS — R052 Subacute cough: Secondary | ICD-10-CM

## 2022-06-27 DIAGNOSIS — R059 Cough, unspecified: Secondary | ICD-10-CM | POA: Diagnosis not present

## 2022-06-27 DIAGNOSIS — H10503 Unspecified blepharoconjunctivitis, bilateral: Secondary | ICD-10-CM

## 2022-06-27 DIAGNOSIS — R350 Frequency of micturition: Secondary | ICD-10-CM | POA: Diagnosis not present

## 2022-06-27 DIAGNOSIS — Z7721 Contact with and (suspected) exposure to potentially hazardous body fluids: Secondary | ICD-10-CM | POA: Diagnosis not present

## 2022-06-27 DIAGNOSIS — R509 Fever, unspecified: Secondary | ICD-10-CM | POA: Diagnosis not present

## 2022-06-27 DIAGNOSIS — Z7289 Other problems related to lifestyle: Secondary | ICD-10-CM

## 2022-06-27 LAB — POCT URINALYSIS DIP (MANUAL ENTRY)
Bilirubin, UA: NEGATIVE
Blood, UA: NEGATIVE
Glucose, UA: NEGATIVE mg/dL
Ketones, POC UA: NEGATIVE mg/dL
Leukocytes, UA: NEGATIVE
Nitrite, UA: NEGATIVE
Protein Ur, POC: NEGATIVE mg/dL
Spec Grav, UA: 1.01 (ref 1.010–1.025)
Urobilinogen, UA: 0.2 E.U./dL
pH, UA: 6 (ref 5.0–8.0)

## 2022-06-28 ENCOUNTER — Encounter: Payer: Self-pay | Admitting: Family Medicine

## 2022-06-28 DIAGNOSIS — R052 Subacute cough: Secondary | ICD-10-CM

## 2022-06-28 DIAGNOSIS — F988 Other specified behavioral and emotional disorders with onset usually occurring in childhood and adolescence: Secondary | ICD-10-CM

## 2022-06-28 DIAGNOSIS — Z113 Encounter for screening for infections with a predominantly sexual mode of transmission: Secondary | ICD-10-CM

## 2022-06-28 LAB — HEPATITIS C ANTIBODY: Hepatitis C Ab: NONREACTIVE

## 2022-06-28 LAB — HEPATITIS B SURFACE ANTIGEN: Hepatitis B Surface Ag: NONREACTIVE

## 2022-06-28 LAB — HSV(HERPES SIMPLEX VRS) I + II AB-IGG
HAV 1 IGG,TYPE SPECIFIC AB: 33 index — ABNORMAL HIGH
HSV 2 IGG,TYPE SPECIFIC AB: 4.98 index — ABNORMAL HIGH

## 2022-06-28 LAB — HIV ANTIBODY (ROUTINE TESTING W REFLEX): HIV 1&2 Ab, 4th Generation: NONREACTIVE

## 2022-06-28 LAB — RPR: RPR Ser Ql: NONREACTIVE

## 2022-06-28 MED ORDER — POLYMYXIN B-TRIMETHOPRIM 10000-0.1 UNIT/ML-% OP SOLN
1.0000 [drp] | OPHTHALMIC | 0 refills | Status: DC
Start: 2022-06-28 — End: 2023-03-13

## 2022-06-28 MED ORDER — AMOXICILLIN-POT CLAVULANATE 875-125 MG PO TABS
1.0000 | ORAL_TABLET | Freq: Two times a day (BID) | ORAL | 0 refills | Status: DC
Start: 2022-06-28 — End: 2022-08-16

## 2022-06-28 NOTE — Addendum Note (Signed)
Addended by: Abbe Amsterdam C on: 06/28/2022 02:57 PM   Modules accepted: Orders

## 2022-06-29 DIAGNOSIS — H1089 Other conjunctivitis: Secondary | ICD-10-CM | POA: Diagnosis not present

## 2022-06-29 LAB — URINE CULTURE
MICRO NUMBER:: 15032880
Result:: NO GROWTH
SPECIMEN QUALITY:: ADEQUATE

## 2022-06-29 LAB — CERVICOVAGINAL ANCILLARY ONLY
Chlamydia: NEGATIVE
Comment: NEGATIVE
Comment: NEGATIVE
Comment: NORMAL
Neisseria Gonorrhea: NEGATIVE
Trichomonas: NEGATIVE

## 2022-07-04 ENCOUNTER — Encounter: Payer: Self-pay | Admitting: Physical Therapy

## 2022-07-05 ENCOUNTER — Encounter: Payer: Self-pay | Admitting: Physical Therapy

## 2022-07-05 ENCOUNTER — Ambulatory Visit (INDEPENDENT_AMBULATORY_CARE_PROVIDER_SITE_OTHER): Payer: Medicare Other | Admitting: Physical Therapy

## 2022-07-05 DIAGNOSIS — M6281 Muscle weakness (generalized): Secondary | ICD-10-CM

## 2022-07-05 DIAGNOSIS — M5459 Other low back pain: Secondary | ICD-10-CM | POA: Diagnosis not present

## 2022-07-05 DIAGNOSIS — M25511 Pain in right shoulder: Secondary | ICD-10-CM

## 2022-07-05 DIAGNOSIS — G8929 Other chronic pain: Secondary | ICD-10-CM

## 2022-07-05 DIAGNOSIS — M25552 Pain in left hip: Secondary | ICD-10-CM | POA: Diagnosis not present

## 2022-07-05 DIAGNOSIS — M25561 Pain in right knee: Secondary | ICD-10-CM | POA: Diagnosis not present

## 2022-07-05 DIAGNOSIS — R262 Difficulty in walking, not elsewhere classified: Secondary | ICD-10-CM

## 2022-07-05 NOTE — Therapy (Signed)
OUTPATIENT PHYSICAL THERAPY TREATMENT   Patient Name: Jonathan Garrison MRN: [id number] DOB:06/22/1945, 77 y.o., male Today's Date: 07/05/2022  END OF SESSION:  PT End of Session - 07/05/22 1432     Visit Number 11    Number of Visits 16    Date for PT Re-Evaluation 09/13/22    Authorization Type MCR    Progress Note Due on Visit 16    PT Start Time 1347    PT Stop Time 1430    PT Time Calculation (min) 43 min    Activity Tolerance Patient tolerated treatment well    Behavior During Therapy WFL for tasks assessed/performed                 Past Medical History:  Diagnosis Date   ADD (attention deficit disorder) without hyperactivity    Allergy    Anemia    no treatment   Anxiety    Arthralgia    many joints, managed with vitamins/herbs and ibuprofen   Arthritis    Cataract    Chest pain    Colitis 1986   single attack (believes was ulcerative)   Depression    Eczema    Esophageal stricture    GERD (gastroesophageal reflux disease)    Hemorrhoids    Lichen planus    Neuromuscular disorder (HCC)    POLIO AGE 66   Oral cancer (HCC) 05/2010   plans for surgery 06/2010   Osteoporosis    hips   Pleurisy 1984   Polio age 65   Umbilical hernia    Past Surgical History:  Procedure Laterality Date   COLONOSCOPY  2012   LEG SURGERY  when in 6th grade   L leg, bone spur removal (just above knee, laterally)   TONGUE SURGERY     for tongue cancer   UPPER GASTROINTESTINAL ENDOSCOPY     Patient Active Problem List   Diagnosis Date Noted   Hyperlipidemia 05/25/2022   Localized osteoarthritis of both shoulders 03/22/2022   Lower back pain 03/22/2022   Neck pain, chronic 03/22/2022   IT band syndrome 03/22/2022   Essential hypertension 02/22/2022   Family history of heart disease 02/22/2022   Trochanteric bursitis of left hip 08/01/2021   Pain of both hip joints 07/20/2021   Seasonal allergic rhinitis 05/24/2021   ETD (Eustachian tube dysfunction), bilateral  05/24/2021   Gastroesophageal reflux disease without esophagitis 12/15/2017   Dysphagia 12/15/2017   History of oral cancer 12/15/2017   Increased prostate specific antigen (PSA) velocity 11/27/2017   Osteopenia 08/01/2017   Vitamin B12 deficiency anemia 06/09/2014   Chest pain 03/13/2013   Depression with anxiety 08/26/2011   Overweight (BMI 25.0-29.9) 08/26/2011   Joint pain 08/26/2011   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 Rt knee pain, Rt shoulder sprain  Rationale for Evaluation and Treatment: Rehabilitation  THERAPY DIAG:  Other low back pain  Pain in left hip  Chronic pain of right knee  Difficulty in walking, not elsewhere classified  Acute pain of right shoulder  Muscle weakness (generalized)  ONSET DATE: chronic pain for years  SUBJECTIVE:  SUBJECTIVE STATEMENT: He relays he can tell he is getting stronger, still dealing with lung infection and is on antibiotics. He wants to work more on back strengthening.   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 Yes: NPRS scale: 3/10 Pain location: back Pain description: ache Aggravating factors: stairs, worse first thing in the morning Relieving factors: meloxicam, voltaren  PRECAUTIONS: None  WEIGHT BEARING RESTRICTIONS: No  FALLS:  Has patient fallen in last 6 months? No  OCCUPATION: retired  PLOF: Independent  PATIENT GOALS: reduce pain,   NEXT MD VISIT:   OBJECTIVE:   DIAGNOSTIC FINDINGS:  Xray (right knee 4 views, left knee 4 views): Right knee with very mild degenerative findings particularly about the  medial joint space  PATIENT SURVEYS:  Eval: FOTO 67% functional   COGNITION: Overall cognitive status: Within functional limits for tasks assessed     SENSATION: WFL  MUSCLE LENGTH: Hamstrings: mildly tight at 80 deg bilat ITB tightness mildly on Rt  POSTURE:   PALPATION: Tender to palpation over Lt SIJ  LUMBAR ROM:   AROM eval  Flexion WNL  Extension WNL  Right lateral flexion WNL  Left lateral flexion WNL  Right rotation WNL  Left rotation WNL   (Blank rows = not tested)   ROM:     Active  Right eval Left eval Rt/Lt 04/28/22  Hip flexion [facility]   Hip extension     Hip abduction [facility]   Hip adduction     Hip internal rotation     Hip external rotation     Knee flexion [facility] WFL   Knee extension [facility]   Shoulder flexion   150  Shoulder abduction   155              (Blank rows = not tested)   MMT:    MMT Right eval Left eval Rt/Lt 04/28/22 Rt/Lt  Hip flexion 4+ 4+ 5/5 5/5  Hip extension      Hip abduction 4+ 4+ 4+/4+ 4+/4+  Hip adduction      Hip internal rotation      Hip external rotation      Knee flexion 5 5 5/5   Knee extension 5- 5- 5/5   Ankle dorsiflexion 4 5 4/5 4+/5        Shoulder flexion   5/5   Shoulder abduction   4+/5   Shoulder IR   5/5   Shoulder ER   4/4+    (Blank rows = not tested)  LUMBAR SPECIAL TESTS:  Eval: Straight leg raise test: Negative and Slump test: Negative Negative SIJ testing No pain with PAM testing to lumbar spine  FUNCTIONAL TESTS:  Eval: SLS X 10 seconds on left, 3-4 seconds on Rt 06/21/22: SLS 6 sec avg on Rt  GAIT: Eval: Comments: independent community Ambulator, 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  TODAY'S TREATMENT:  07/05/22 -Nu step L6 X 6 min -Low back assessment: negative SLR, negative slump test, negative rib hump present with forward flexion, he does have some thoracic kyphosis noted and + prone instability test L4-5 -thoracic extension stretch seated 6 sec X 6 -thoracic extension stretch  at wall 6 sec X 6 -hip hinge X 12 with dowel on back for proper technique -Kettlebell half deadlift 15# X 12 -Row machine 55# 2X12 -Lat pulldown machine 55# 2X12 -Diagonal chops 10# 2X12 bilat  06/21/22 -recumbent bike L5 X 8 min -TRX squats and push ups 2X12 -  Seated lumbar Pball roll outs for flexion stretch 6 sec hold X 12 reps -Rows with blue 2X12 -Extensions with blue 2X12 -Anti-rotation blue 2X12 bilat -Leg press DL 161# [id number] -Rocker board A-P 2 min -Single leg balance 12 sec X 6 bilat -Tandem walk on foam beam X 6  -Sidestepping on foam beam X 6   06/07/22 -Nu step L6 UE/LE X 8 min -Single knee to chest stretch 30 sec X 2 bilat -Supine piriformis stretch 30 sec  X2 bilat knee to opposite shoulder and X 2 bilat figure 4 -Supine double SLR with isometric hold for core 12 sec X 2 -Supine alternate SLR 2X6 bilat -Supine hip abd/add raises 2X6 bilat -Seated lumbar Pball roll outs for flexion stretch 6 sec hold X 12 reps, then feet on ball for DKTC stretch 6 sec X 12 -wall squats with back on ball  X12 reps -Rows with blue 2X12 -Extensions with blue 2X12  05/25/22 -Nu step L6 UE/LE X 8 min -Single knee to chest stretch 30 sec X 2 bilat -Double knee to chest stretch 30 sec X 2 -Supine piriformis stretch 30 sec  X2 bilat -Supine hamstring stretch with strap 30 sec X 2 -Supine double SLR with isometric hold for core 20 sec X 2 -Single leg bridge 6 sec holds 2X 6 bilat -prone alternating opposite arm/leg lifts 6 reps X 2 sets each side -Quadriped leg extensions 2 X 6 reps bilat, cues for neutral spine and not to over rotate -Seated lumbar Pball roll outs for flexion stretch 6 sec hold X 12 reps, then feet on ball for DKTC stretch 6 sec X 12 -Sit to stands no UE support 2 X 6 reps -wall squats with back on ball  X12 reps     PATIENT EDUCATION: Education details: HEP, PT plan of care Person educated: Patient Education method: Explanation, Demonstration, Verbal cues, and  Handouts Education comprehension: verbalized understanding and needs further education   HOME EXERCISE PROGRAM: Access Code: [email or web address] URL: [email or web address] Date: 07/05/2022 Prepared by: Ivery Quale  Exercises - Single Leg Bridge  - 2 x daily - 6 x weekly - 1-2 sets - 10 reps - 5 hold - Seated Hamstring Stretch  - 2 x daily - 6 x weekly - 1 sets - 2 reps - 30 hold - Standing ITB Stretch  - 2 x daily - 6 x weekly - 2-3 sets - 30 hold - Heel Toe Raises with Counter Support  - 2 x daily - 6 x weekly - 2-3 sets - 10 reps - Single Leg Stance  - 2 x daily - 6 x weekly - 1 sets - 5 reps - 10 sec hold - Side Stepping with Resistance at Ankles  - 2 x daily - 6 x weekly - 2-3 sets - 10 reps - Sit to Stand Without Arm Support  - 2 x daily - 6 x weekly - 1-2 sets - 10 reps - Supine Active Straight Leg Raise  - 2 x daily - 6 x weekly - 1-2 sets - 12 reps - Sidelying Hip Abduction (Both Sides)  - 2 x daily - 6 x weekly - 1-2 sets - 12 reps - Prone Hip Extension  - 2 x daily - 6 x weekly - 1-2 sets - 12 reps - Bird Dog  - 2 x daily - 6 x weekly - 1 sets - 12 reps - 2 sec hold - Supine Piriformis Stretch with Foot on Ground  - 2 x daily -  6 x weekly - 2 sets - 30 hold - Tandem Walking  - 2 x daily - 6 x weekly - 3 sets - 12 reps - Shoulder External Rotation with Anchored Resistance  - 2 x daily - 6 x weekly - 2-3 sets - 12 reps - Shoulder External Rotation and Scapular Retraction with Resistance  - 2 x daily - 6 x weekly - 2-3 sets - 12 reps - Standing Shoulder Horizontal Abduction with Resistance  - 2 x daily - 6 x weekly - 2-3 sets - 10 reps - Standing Single Arm Shoulder Abduction with Resistance (Mirrored)  - 2 x daily - 6 x weekly - 2-3 sets - 12 reps - Heel Walking  - 2 x daily - 6 x weekly - 2-3 sets - 12 reps - Prone Alternating Arm and Leg Lifts  - 1 x daily - 3 x weekly - 1-2 sets - 12 reps - Prone Double Leg Lift  - 1 x daily - 3 x weekly - 1-2 sets - 12 reps - Quadruped  Leg Lifts  - 1 x daily - 3 x weekly - 1-2 sets - 12 reps - Plank on Knees  - 1 x daily - 3 x weekly - 1 sets - 6 reps - 20-30 sec hold - Seated Thoracic Lumbar Extension with Pectoralis Stretch  - 1 x daily - 3 x weekly - 1 sets - 12 reps - 6 sec hold - Standing Thoracic Extension at Wall  - 1 x daily - 3 x weekly - 1 sets - 12 reps - Half Deadlift with Kettlebell  - 1 x daily - 3 x weekly - 3 sets - 10 reps - 6 sec hold - Standing Diagonal Chop  - 1 x daily - 3 x weekly - 2 sets - 12 reps - Anti-Rotation Press With Sidesteps and Anchored Resistance  - 1 x daily - 3 x weekly - 2 sets - 10 reps  ASSESSMENT:  CLINICAL IMPRESSION: I gave him more back strengthening exercises at his request as he does have some instability noted L4-5 today. I showed him more gym activity he can do as well as he has access to [facility]. I printed out these new activities so he can remember them but does not need to do everything on the list all the time, that he should mix them up and so some part of the time.  OBJECTIVE IMPAIRMENTS: decreased activity tolerance, difficulty walking, decreased balance, decreased endurance, decreased mobility, decreased ROM, decreased strength, impaired flexibility, impaired LE use, postural dysfunction, and pain.  ACTIVITY LIMITATIONS: bending, lifting, carry, locomotion, cleaning, community activity,  PERSONAL FACTORS: childhood polio, OA are also affecting patient's functional outcome.  REHAB POTENTIAL: Good  CLINICAL DECISION MAKING: Stable/uncomplicated  EVALUATION COMPLEXITY: Low    GOALS: Short term PT Goals Target date: 03/07/2022   Pt will be I and compliant with HEP. Baseline:  Goal status: MET 03/07/22 Pt will decrease pain by 25% overall Baseline: Goal status: MET 03/07/22  Long term PT goals Target date:09/13/22   Pt will improve  hip/knee strength to at least 5-/5 MMT to improve functional strength Baseline: Goal status: ongoing, 4 to 4+ for Rt ankle DF and  hip abd on avg 06/21/22 Pt will improve FOTO to at least 69% functional to show improved function Baseline: Goal status: DC this goal as foto was closed out. Pt will reduce pain to overall less than 2-3/10 with usual activity and kayaking Baseline: Goal status: ongoing 06/21/22,  has been having more back pain  4. Pt will be able to hold SLS X 10 seconds on avg for his Rt leg to show improved balance.  Baseline: 3-4 seconds on Rt Goal status: ongoing 06/21/22  PLAN: PT FREQUENCY: 1 times per 2 weeks  PT DURATION: 12 weeks  PLANNED INTERVENTIONS (unless contraindicated): aquatic PT, Canalith repositioning, cryotherapy, Electrical stimulation, Iontophoresis with 4 mg/ml dexamethasome, Moist heat, traction, Ultrasound, gait training, Therapeutic exercise, balance training, neuromuscular re-education, patient/family education, prosthetic training, manual techniques, passive ROM, dry needling, taping, vasopnuematic device, vestibular, spinal manipulations, joint manipulations  PLAN FOR NEXT SESSION: lumbar/core strength, leg strength progressions as tolerated.   April Manson, PT,DPT 07/05/2022, 2:32 PM

## 2022-07-06 NOTE — Telephone Encounter (Signed)
Pt was boosted with plain TD on 01/12/17- not Tdap. Please advise.

## 2022-07-10 MED ORDER — AMPHETAMINE-DEXTROAMPHETAMINE 20 MG PO TABS
20.0000 mg | ORAL_TABLET | Freq: Two times a day (BID) | ORAL | 0 refills | Status: DC
Start: 2022-07-10 — End: 2022-11-08

## 2022-07-10 NOTE — Addendum Note (Signed)
Addended by: Abbe Amsterdam C on: 07/10/2022 06:08 AM   Modules accepted: Orders

## 2022-07-14 MED ORDER — VALACYCLOVIR HCL 1 G PO TABS: 1000.0000 mg | ORAL_TABLET | Freq: Every day | ORAL | 3 refills | Status: DC

## 2022-07-14 MED ORDER — VALACYCLOVIR HCL 1 G PO TABS
1000.0000 mg | ORAL_TABLET | Freq: Every day | ORAL | 3 refills | Status: DC
Start: 2022-07-14 — End: 2022-07-14

## 2022-07-14 NOTE — Addendum Note (Signed)
Addended by: Abbe Amsterdam C on: 07/14/2022 11:27 AM   Modules accepted: Orders

## 2022-07-14 NOTE — Addendum Note (Signed)
Addended by: Abbe Amsterdam C on: 07/14/2022 05:30 PM   Modules accepted: Orders

## 2022-07-19 ENCOUNTER — Encounter: Payer: Self-pay | Admitting: Physical Therapy

## 2022-07-19 ENCOUNTER — Ambulatory Visit (INDEPENDENT_AMBULATORY_CARE_PROVIDER_SITE_OTHER): Payer: Medicare Other | Admitting: Physical Therapy

## 2022-07-19 DIAGNOSIS — M5459 Other low back pain: Secondary | ICD-10-CM | POA: Diagnosis not present

## 2022-07-19 DIAGNOSIS — G8929 Other chronic pain: Secondary | ICD-10-CM

## 2022-07-19 DIAGNOSIS — M6281 Muscle weakness (generalized): Secondary | ICD-10-CM

## 2022-07-19 DIAGNOSIS — R262 Difficulty in walking, not elsewhere classified: Secondary | ICD-10-CM | POA: Diagnosis not present

## 2022-07-19 DIAGNOSIS — M25561 Pain in right knee: Secondary | ICD-10-CM | POA: Diagnosis not present

## 2022-07-19 DIAGNOSIS — M25552 Pain in left hip: Secondary | ICD-10-CM | POA: Diagnosis not present

## 2022-07-19 DIAGNOSIS — M25511 Pain in right shoulder: Secondary | ICD-10-CM

## 2022-07-19 NOTE — Therapy (Signed)
OUTPATIENT PHYSICAL THERAPY TREATMENT   Patient Name: Christophere Hillhouse MRN: [id number] DOB:08/01/1945, 77 y.o., male Today's Date: 07/19/2022  END OF SESSION:  PT End of Session - 07/19/22 1355     Visit Number 12    Number of Visits 16    Date for PT Re-Evaluation 09/13/22    Authorization Type MCR    Progress Note Due on Visit 16    PT Start Time 1347    PT Stop Time 1430    PT Time Calculation (min) 43 min    Activity Tolerance Patient tolerated treatment well    Behavior During Therapy WFL for tasks assessed/performed                 Past Medical History:  Diagnosis Date   ADD (attention deficit disorder) without hyperactivity    Allergy    Anemia    no treatment   Anxiety    Arthralgia    many joints, managed with vitamins/herbs and ibuprofen   Arthritis    Cataract    Chest pain    Colitis 1986   single attack (believes was ulcerative)   Depression    Eczema    Esophageal stricture    GERD (gastroesophageal reflux disease)    Hemorrhoids    Lichen planus    Neuromuscular disorder (HCC)    POLIO AGE 48   Oral cancer (HCC) 05/2010   plans for surgery 06/2010   Osteoporosis    hips   Pleurisy 1984   Polio age 42   Umbilical hernia    Past Surgical History:  Procedure Laterality Date   COLONOSCOPY  2012   LEG SURGERY  when in 6th grade   L leg, bone spur removal (just above knee, laterally)   TONGUE SURGERY     for tongue cancer   UPPER GASTROINTESTINAL ENDOSCOPY     Patient Active Problem List   Diagnosis Date Noted   Hyperlipidemia 05/25/2022   Localized osteoarthritis of both shoulders 03/22/2022   Lower back pain 03/22/2022   Neck pain, chronic 03/22/2022   IT band syndrome 03/22/2022   Essential hypertension 02/22/2022   Family history of heart disease 02/22/2022   Trochanteric bursitis of left hip 08/01/2021   Pain of both hip joints 07/20/2021   Seasonal allergic rhinitis 05/24/2021   ETD (Eustachian tube dysfunction), bilateral  05/24/2021   Gastroesophageal reflux disease without esophagitis 12/15/2017   Dysphagia 12/15/2017   History of oral cancer 12/15/2017   Increased prostate specific antigen (PSA) velocity 11/27/2017   Osteopenia 08/01/2017   Vitamin B12 deficiency anemia 06/09/2014   Chest pain 03/13/2013   Depression with anxiety 08/26/2011   Overweight (BMI 25.0-29.9) 08/26/2011   Joint pain 08/26/2011   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 Rt knee pain, Rt shoulder sprain  Rationale for Evaluation and Treatment: Rehabilitation  THERAPY DIAG:  Other low back pain  Pain in left hip  Chronic pain of right knee  Difficulty in walking, not elsewhere classified  Acute pain of right shoulder  Muscle weakness (generalized)  ONSET DATE: chronic pain for years  SUBJECTIVE:  SUBJECTIVE STATEMENT: He relays he had more pain in SIJ when he woke up 8/10 but after stretching and the day has gone on, it is down to 4/10. 3/10 pain in his shoulders today. He leaves for kayak trip this Friday.   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 Yes: NPRS scale: see above statement/10 Pain location: back Pain description: ache Aggravating factors: stairs, worse first thing in the morning Relieving factors: meloxicam, voltaren  PRECAUTIONS: None  WEIGHT BEARING RESTRICTIONS: No  FALLS:  Has patient fallen in last 6 months? No  OCCUPATION: retired  PLOF: Independent  PATIENT GOALS: reduce pain,   NEXT MD VISIT:   OBJECTIVE:   DIAGNOSTIC FINDINGS:  Xray (right knee 4 views, left knee 4 views): Right  knee with very mild degenerative findings particularly about the medial joint space  PATIENT SURVEYS:  Eval: FOTO 67% functional   COGNITION: Overall cognitive status: Within functional limits for tasks assessed     SENSATION: WFL  MUSCLE LENGTH: Hamstrings: mildly tight at 80 deg bilat ITB tightness mildly on Rt  POSTURE:   PALPATION: Tender to palpation over Lt SIJ  LUMBAR ROM:   AROM eval  Flexion WNL  Extension WNL  Right lateral flexion WNL  Left lateral flexion WNL  Right rotation WNL  Left rotation WNL   (Blank rows = not tested)   ROM:     Active  Right eval Left eval Rt/Lt 04/28/22  Hip flexion [facility]   Hip extension     Hip abduction [facility]   Hip adduction     Hip internal rotation     Hip external rotation     Knee flexion [facility] WFL   Knee extension [facility]   Shoulder flexion   150  Shoulder abduction   155              (Blank rows = not tested)   MMT:    MMT Right eval Left eval Rt/Lt 04/28/22 Rt/Lt  Hip flexion 4+ 4+ 5/5 5/5  Hip extension      Hip abduction 4+ 4+ 4+/4+ 4+/4+  Hip adduction      Hip internal rotation      Hip external rotation      Knee flexion 5 5 5/5   Knee extension 5- 5- 5/5   Ankle dorsiflexion 4 5 4/5 4+/5        Shoulder flexion   5/5   Shoulder abduction   4+/5   Shoulder IR   5/5   Shoulder ER   4/4+    (Blank rows = not tested)  LUMBAR SPECIAL TESTS:  Eval: Straight leg raise test: Negative and Slump test: Negative Negative SIJ testing No pain with PAM testing to lumbar spine  FUNCTIONAL TESTS:  Eval: SLS X 10 seconds on left, 3-4 seconds on Rt 06/21/22: SLS 6 sec avg on Rt  GAIT: Eval: Comments: independent community Ambulator, 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  TODAY'S TREATMENT:  07/19/22 -Nu step L6 X 6 min -Seated chess press machine 35# 3X12 -Row machine 55# 3X12 -Lat pulldown machine 45# 3X12 -Diagonal chops 35# 2X12 bilat -Leg press machine 100# 2 X12 DL, then 29# 2 X 12 SL -thoracic  extension stretch seated 6 sec X 12 -thoracic extension stretch at wall 6 sec X 12 -Kettlebell deadlift 15# 2 X 12   06/21/22 -recumbent bike L5 X 8 min -TRX squats and push ups 2X12 -Seated lumbar Pball  roll outs for flexion stretch 6 sec hold X 12 reps -Rows with blue 2X12 -Extensions with blue 2X12 -Anti-rotation blue 2X12 bilat -Leg press DL 161# 0R60 -Rocker board A-P 2 min -Single leg balance 12 sec X 6 bilat -Tandem walk on foam beam X 6  -Sidestepping on foam beam X 6   06/07/22 -Nu step L6 UE/LE X 8 min -Single knee to chest stretch 30 sec X 2 bilat -Supine piriformis stretch 30 sec  X2 bilat knee to opposite shoulder and X 2 bilat figure 4 -Supine double SLR with isometric hold for core 12 sec X 2 -Supine alternate SLR 2X6 bilat -Supine hip abd/add raises 2X6 bilat -Seated lumbar Pball roll outs for flexion stretch 6 sec hold X 12 reps, then feet on ball for DKTC stretch 6 sec X 12 -wall squats with back on ball  X12 reps -Rows with blue 2X12 -Extensions with blue 2X12  05/25/22 -Nu step L6 UE/LE X 8 min -Single knee to chest stretch 30 sec X 2 bilat -Double knee to chest stretch 30 sec X 2 -Supine piriformis stretch 30 sec  X2 bilat -Supine hamstring stretch with strap 30 sec X 2 -Supine double SLR with isometric hold for core 20 sec X 2 -Single leg bridge 6 sec holds 2X 6 bilat -prone alternating opposite arm/leg lifts 6 reps X 2 sets each side -Quadriped leg extensions 2 X 6 reps bilat, cues for neutral spine and not to over rotate -Seated lumbar Pball roll outs for flexion stretch 6 sec hold X 12 reps, then feet on ball for DKTC stretch 6 sec X 12 -Sit to stands no UE support 2 X 6 reps -wall squats with back on ball  X12 reps     PATIENT EDUCATION: Education details: HEP, PT plan of care Person educated: Patient Education method: Explanation, Demonstration, Verbal cues, and Handouts Education comprehension: verbalized understanding and needs further  education   HOME EXERCISE PROGRAM: Access Code: [email or web address] URL: [email or web address] Date: 07/05/2022 Prepared by: Ivery Quale  Exercises - Single Leg Bridge  - 2 x daily - 6 x weekly - 1-2 sets - 10 reps - 5 hold - Seated Hamstring Stretch  - 2 x daily - 6 x weekly - 1 sets - 2 reps - 30 hold - Standing ITB Stretch  - 2 x daily - 6 x weekly - 2-3 sets - 30 hold - Heel Toe Raises with Counter Support  - 2 x daily - 6 x weekly - 2-3 sets - 10 reps - Single Leg Stance  - 2 x daily - 6 x weekly - 1 sets - 5 reps - 10 sec hold - Side Stepping with Resistance at Ankles  - 2 x daily - 6 x weekly - 2-3 sets - 10 reps - Sit to Stand Without Arm Support  - 2 x daily - 6 x weekly - 1-2 sets - 10 reps - Supine Active Straight Leg Raise  - 2 x daily - 6 x weekly - 1-2 sets - 12 reps - Sidelying Hip Abduction (Both Sides)  - 2 x daily - 6 x weekly - 1-2 sets - 12 reps - Prone Hip Extension  - 2 x daily - 6 x weekly - 1-2 sets - 12 reps - Bird Dog  - 2 x daily - 6 x weekly - 1 sets - 12 reps - 2 sec hold - Supine Piriformis Stretch with Foot on Ground  - 2 x daily - 6 x weekly -  2 sets - 30 hold - Tandem Walking  - 2 x daily - 6 x weekly - 3 sets - 12 reps - Shoulder External Rotation with Anchored Resistance  - 2 x daily - 6 x weekly - 2-3 sets - 12 reps - Shoulder External Rotation and Scapular Retraction with Resistance  - 2 x daily - 6 x weekly - 2-3 sets - 12 reps - Standing Shoulder Horizontal Abduction with Resistance  - 2 x daily - 6 x weekly - 2-3 sets - 10 reps - Standing Single Arm Shoulder Abduction with Resistance (Mirrored)  - 2 x daily - 6 x weekly - 2-3 sets - 12 reps - Heel Walking  - 2 x daily - 6 x weekly - 2-3 sets - 12 reps - Prone Alternating Arm and Leg Lifts  - 1 x daily - 3 x weekly - 1-2 sets - 12 reps - Prone Double Leg Lift  - 1 x daily - 3 x weekly - 1-2 sets - 12 reps - Quadruped Leg Lifts  - 1 x daily - 3 x weekly - 1-2 sets - 12 reps - Plank on Knees  -  1 x daily - 3 x weekly - 1 sets - 6 reps - 20-30 sec hold - Seated Thoracic Lumbar Extension with Pectoralis Stretch  - 1 x daily - 3 x weekly - 1 sets - 12 reps - 6 sec hold - Standing Thoracic Extension at Wall  - 1 x daily - 3 x weekly - 1 sets - 12 reps - Half Deadlift with Kettlebell  - 1 x daily - 3 x weekly - 3 sets - 10 reps - 6 sec hold - Standing Diagonal Chop  - 1 x daily - 3 x weekly - 2 sets - 12 reps - Anti-Rotation Press With Sidesteps and Anchored Resistance  - 1 x daily - 3 x weekly - 2 sets - 10 reps  ASSESSMENT:  CLINICAL IMPRESSION: He continues to improve with his overall strength. He does still have intermittent pain levels and will continue to benefit from further PT. He will take a 10 day trip and will reschedule PT after that.   OBJECTIVE IMPAIRMENTS: decreased activity tolerance, difficulty walking, decreased balance, decreased endurance, decreased mobility, decreased ROM, decreased strength, impaired flexibility, impaired LE use, postural dysfunction, and pain.  ACTIVITY LIMITATIONS: bending, lifting, carry, locomotion, cleaning, community activity,  PERSONAL FACTORS: childhood polio, OA are also affecting patient's functional outcome.  REHAB POTENTIAL: Good  CLINICAL DECISION MAKING: Stable/uncomplicated  EVALUATION COMPLEXITY: Low    GOALS: Short term PT Goals Target date: 03/07/2022   Pt will be I and compliant with HEP. Baseline:  Goal status: MET 03/07/22 Pt will decrease pain by 25% overall Baseline: Goal status: MET 03/07/22  Long term PT goals Target date:09/13/22   Pt will improve  hip/knee strength to at least 5-/5 MMT to improve functional strength Baseline: Goal status: ongoing, 4 to 4+ for Rt ankle DF and hip abd on avg 06/21/22 Pt will improve FOTO to at least 69% functional to show improved function Baseline: Goal status: DC this goal as foto was closed out. Pt will reduce pain to overall less than 2-3/10 with usual activity and  kayaking Baseline: Goal status: ongoing 06/21/22, has been having more back pain  4. Pt will be able to hold SLS X 10 seconds on avg for his Rt leg to show improved balance.  Baseline: 3-4 seconds on Rt Goal status: ongoing 06/21/22  PLAN:  PT FREQUENCY: 1 times per 2 weeks  PT DURATION: 12 weeks  PLANNED INTERVENTIONS (unless contraindicated): aquatic PT, Canalith repositioning, cryotherapy, Electrical stimulation, Iontophoresis with 4 mg/ml dexamethasome, Moist heat, traction, Ultrasound, gait training, Therapeutic exercise, balance training, neuromuscular re-education, patient/family education, prosthetic training, manual techniques, passive ROM, dry needling, taping, vasopnuematic device, vestibular, spinal manipulations, joint manipulations  PLAN FOR NEXT SESSION: how was his kayack lumbar/core strength, leg strength progressions as tolerated.   April Manson, PT,DPT 07/19/2022, 1:56 PM

## 2022-07-21 DIAGNOSIS — J302 Other seasonal allergic rhinitis: Secondary | ICD-10-CM | POA: Diagnosis not present

## 2022-07-21 NOTE — Addendum Note (Signed)
Addended by: Abbe Amsterdam C on: 07/21/2022 05:52 AM   Modules accepted: Orders

## 2022-08-04 ENCOUNTER — Encounter (INDEPENDENT_AMBULATORY_CARE_PROVIDER_SITE_OTHER): Payer: Medicare Other | Admitting: Family Medicine

## 2022-08-04 DIAGNOSIS — A6000 Herpesviral infection of urogenital system, unspecified: Secondary | ICD-10-CM

## 2022-08-04 NOTE — Telephone Encounter (Signed)
Please see the MyChart message reply(ies) for my assessment and plan.  The patient gave consent for this Medical Advice Message and is aware that it may result in a bill to their insurance company as well as the possibility that this may result in a co-payment or deductible. They are an established patient, but are not seeking medical advice exclusively about a problem treated during an in person or video visit in the last 7 days. I did not recommend an in person or video visit within 7 days of my reply.  I spent a total of 10 minutes cumulative time within 7 days through MyChart messaging Taneesha Edgin, MD  

## 2022-08-10 DIAGNOSIS — F410 Panic disorder [episodic paroxysmal anxiety] without agoraphobia: Secondary | ICD-10-CM | POA: Diagnosis not present

## 2022-08-10 DIAGNOSIS — F411 Generalized anxiety disorder: Secondary | ICD-10-CM | POA: Diagnosis not present

## 2022-08-16 ENCOUNTER — Ambulatory Visit (INDEPENDENT_AMBULATORY_CARE_PROVIDER_SITE_OTHER): Payer: Medicare Other | Admitting: Pulmonary Disease

## 2022-08-16 ENCOUNTER — Encounter (HOSPITAL_BASED_OUTPATIENT_CLINIC_OR_DEPARTMENT_OTHER): Payer: Self-pay | Admitting: Pulmonary Disease

## 2022-08-16 VITALS — BP 134/76 | HR 90 | Resp 16 | Ht 73.0 in | Wt 200.4 lb

## 2022-08-16 DIAGNOSIS — R052 Subacute cough: Secondary | ICD-10-CM | POA: Diagnosis not present

## 2022-08-16 NOTE — Patient Instructions (Signed)
Follow up in 6 months 

## 2022-08-16 NOTE — Progress Notes (Signed)
[facility]  Chief Complaint  Patient presents with   New Patient (Initial Visit)    Having a for a couple months that hasn't gone away, used antibiotic , and medrol pac  , had a chest x ray that didn't show any thing, hx mouth cancer , he also said that he has mold in his home and old flooring     Past Surgical History:  He  has a past surgical history that includes Leg Surgery (when in 6th grade); Tongue surgery; Upper gastrointestinal endoscopy; and Colonoscopy (2012).  Past Medical History:  ADD, Allergies, Anemia, Anxiety, Arthritis, Cataract, Colitis, Depression, Eczema, Esophageal stricture, GERD, Hemorrhoids, Lichen planus, Polio, Osteoporosis  Constitutional:  BP 134/76   Pulse 90   Resp 16   Ht 6\' 1"  (1.854 m)   Wt 200 lb 6.4 oz (90.9 kg)   SpO2 99%   BMI 26.44 kg/m   Brief Summary:  Jonathan Garrison is a 77 y.o. male with subacute cough.      Subjective:   He is a Technical sales engineer.  He went to a [employer] a few months ago.  Had dust exposure.  Got a cough and treated for "walking pneumonia" with two antibiotics.  Had to use medrol also.  Better now.  He had pneumonia several years ago.  He has allergies and uses an OTC antihistamine.  His house was build in the 1950's.  He has asbestos tape around some of his heating equipment in the basement, and maybe in some of the floor tiling.  He smoked briefly in his 89's.  He had mold exposure a couple of years ago.  He exercises on a regular basis.  No issues with his breathing while asleep.  Chest xray from 06/27/22 was normal.  Physical Exam:   Appearance - well kempt   ENMT - no sinus tenderness, no oral exudate, no LAN, Mallampati 2 airway, no stridor  Respiratory - equal breath sounds bilaterally, no wheezing or rales  CV - s1s2 regular rate and rhythm, no murmurs  Ext - no clubbing, no edema  Skin - no rashes  Psych - normal mood and affect   Pulmonary testing:     Chest Imaging:    Social History:  He  reports that he quit smoking about 46 years ago. His smoking use included pipe and cigarettes. He has never used smokeless tobacco. He reports current alcohol use of about 7.0 - 14.0 standard drinks of alcohol per week. He reports that he does not use drugs.  Family History:  His family history includes Benign prostatic hyperplasia in his father; Cancer in his sister; Diabetes in his maternal grandfather and mother; Heart disease in his father; Hyperlipidemia in his mother; Hypertension in his father and mother; Lichen planus in his sister.     Assessment/Plan:   Subacute cough. - he likely had asthmatic bronchitis after environmental exposure and upper respiratory infection in the setting of allergic rhinitis - his chest xray is clear and no significant symptoms at this time - monitor clinically; defer additional testing at this time  Asbestos exposure. - doesn't seem to have significant exposure - no radiographic findings of asbestos related lung disease  Allergic rhinitis. - continue flonase, claritin  Time Spent Involved in Patient Care on Day of Examination:  37 minutes  Follow up:   Patient Instructions  Follow up in 6 months  Medication List:   Allergies as of 08/16/2022   No Known Allergies  Medication List        Accurate as of August 16, 2022 11:30 AM. If you have any questions, ask your nurse or doctor.          STOP taking these medications    amoxicillin-clavulanate 875-125 MG tablet Commonly known as: AUGMENTIN Stopped by: Coralyn Helling       TAKE these medications    amphetamine-dextroamphetamine 20 MG tablet Commonly known as: Adderall Take 1 tablet (20 mg total) by mouth 2 (two) times daily.   aspirin 81 MG tablet Take 81 mg by mouth daily.   b complex vitamins tablet Take 0.5 tablets by mouth daily.   Biotin 5000 MCG Caps Take 1 capsule by mouth daily.   Calcium-Magnesium-Zinc  167-83-8 MG Tabs Take by mouth. 1 tablet daily   ferrous sulfate 324 (65 Fe) MG Tbec Take one every other day for low iron   fluticasone 50 MCG/ACT nasal spray Commonly known as: FLONASE SHAKE LIQUID AND USE 2 SPRAYS IN EACH NOSTRIL DAILY   glucosamine-chondroitin 500-400 MG tablet Take 1 tablet by mouth 2 (two) times daily.   loratadine 10 MG tablet Commonly known as: CLARITIN Take 10 mg by mouth daily.   losartan 100 MG tablet Commonly known as: COZAAR Take 1 tablet (100 mg total) by mouth daily.   meloxicam 15 MG tablet Commonly known as: MOBIC TAKE 1 TABLET(15 MG) BY MOUTH DAILY AS NEEDED   multivitamin with minerals tablet Take 1 tablet by mouth daily.   NON FORMULARY ALJ -herbal decongestant  Takes as needed   omeprazole 40 MG capsule Commonly known as: PRILOSEC Take 1 capsule (40 mg total) by mouth in the morning and at bedtime.   rosuvastatin 20 MG tablet Commonly known as: CRESTOR Take 1 tablet (20 mg total) by mouth daily.   SAW PALMETTO COMPLEX PO Take 1 tablet by mouth 2 (two) times daily.   sildenafil 20 MG tablet Commonly known as: Revatio Take 2-3 tablets one hour prior to intercourse   trimethoprim-polymyxin b ophthalmic solution Commonly known as: Polytrim Place 1 drop into both eyes every 4 (four) hours.   valACYclovir 1000 MG tablet Commonly known as: Valtrex Take 1 tablet (1,000 mg total) by mouth daily.   Vitamin D3 50 MCG (2000 UT) capsule Take 2,000 Units by mouth daily.        Signature:  Coralyn Helling, MD [facility] Pager - [phone] 08/16/2022, 11:30 AM

## 2022-08-18 ENCOUNTER — Telehealth: Payer: Self-pay

## 2022-08-18 NOTE — Telephone Encounter (Signed)
Left message for pt to call back  °

## 2022-08-19 NOTE — Telephone Encounter (Signed)
Spoke with pt regarding upcoming scheduled visit with Dr. Allyson Sabal. Appointment changed to 8/27. Pt verbalizes understanding.

## 2022-08-19 NOTE — Telephone Encounter (Signed)
Left message for pt to call back  °

## 2022-08-23 ENCOUNTER — Ambulatory Visit: Payer: Medicare Other | Admitting: Cardiovascular Disease

## 2022-08-24 ENCOUNTER — Encounter: Payer: Self-pay | Admitting: Physical Therapy

## 2022-08-24 ENCOUNTER — Ambulatory Visit: Payer: Medicare Other | Admitting: Physical Therapy

## 2022-08-24 DIAGNOSIS — G8929 Other chronic pain: Secondary | ICD-10-CM | POA: Diagnosis not present

## 2022-08-24 DIAGNOSIS — M25561 Pain in right knee: Secondary | ICD-10-CM

## 2022-08-24 DIAGNOSIS — R262 Difficulty in walking, not elsewhere classified: Secondary | ICD-10-CM | POA: Diagnosis not present

## 2022-08-24 DIAGNOSIS — M25511 Pain in right shoulder: Secondary | ICD-10-CM | POA: Diagnosis not present

## 2022-08-24 DIAGNOSIS — M25552 Pain in left hip: Secondary | ICD-10-CM

## 2022-08-24 DIAGNOSIS — M5459 Other low back pain: Secondary | ICD-10-CM

## 2022-08-24 NOTE — Therapy (Signed)
OUTPATIENT PHYSICAL THERAPY TREATMENT   Patient Name: Jonathan Garrison MRN: [id number] DOB:Aug 07, 1945, 77 y.o., male Today's Date: 08/24/2022  END OF SESSION:  PT End of Session - 08/24/22 1359     Visit Number 13    Number of Visits 16    Date for PT Re-Evaluation 09/13/22    Authorization Type MCR    Progress Note Due on Visit 16    PT Start Time 1351    PT Stop Time 1430    PT Time Calculation (min) 39 min    Activity Tolerance Patient tolerated treatment well    Behavior During Therapy WFL for tasks assessed/performed                 Past Medical History:  Diagnosis Date   ADD (attention deficit disorder) without hyperactivity    Allergy    Anemia    no treatment   Anxiety    Arthralgia    many joints, managed with vitamins/herbs and ibuprofen   Arthritis    Cataract    Chest pain    Colitis 1986   single attack (believes was ulcerative)   Depression    Eczema    Esophageal stricture    GERD (gastroesophageal reflux disease)    Hemorrhoids    Lichen planus    Neuromuscular disorder (HCC)    POLIO AGE 74   Oral cancer (HCC) 05/2010   plans for surgery 06/2010   Osteoporosis    hips   Pleurisy 1984   Polio age 15   Umbilical hernia    Past Surgical History:  Procedure Laterality Date   COLONOSCOPY  2012   LEG SURGERY  when in 6th grade   L leg, bone spur removal (just above knee, laterally)   TONGUE SURGERY     for tongue cancer   UPPER GASTROINTESTINAL ENDOSCOPY     Patient Active Problem List   Diagnosis Date Noted   Hyperlipidemia 05/25/2022   Localized osteoarthritis of both shoulders 03/22/2022   Lower back pain 03/22/2022   Neck pain, chronic 03/22/2022   IT band syndrome 03/22/2022   Essential hypertension 02/22/2022   Family history of heart disease 02/22/2022   Trochanteric bursitis of left hip 08/01/2021   Pain of both hip joints 07/20/2021   Seasonal allergic rhinitis 05/24/2021   ETD (Eustachian tube dysfunction), bilateral  05/24/2021   Gastroesophageal reflux disease without esophagitis 12/15/2017   Dysphagia 12/15/2017   History of oral cancer 12/15/2017   Increased prostate specific antigen (PSA) velocity 11/27/2017   Osteopenia 08/01/2017   Vitamin B12 deficiency anemia 06/09/2014   Chest pain 03/13/2013   Depression with anxiety 08/26/2011   Overweight (BMI 25.0-29.9) 08/26/2011   Joint pain 08/26/2011   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 Rt knee pain, Rt shoulder sprain  Rationale for Evaluation and Treatment: Rehabilitation  THERAPY DIAG:  Other low back pain  Pain in left hip  Chronic pain of right knee  Difficulty in walking, not elsewhere classified  Acute pain of right shoulder  ONSET DATE: chronic pain for years  SUBJECTIVE:  SUBJECTIVE STATEMENT: He is doing okay for the most part, his pain has been intermittent, no pain at the moment but can get sharp pains still in his Rt knee and left hip. He is considering getting another injection in his knee. He has been trying to stay active overall.   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 Yes: NPRS scale: see above statement/10 Pain location: back Pain description: ache Aggravating factors: stairs, worse first thing in the morning Relieving factors: meloxicam, voltaren  PRECAUTIONS: None  WEIGHT BEARING RESTRICTIONS: No  FALLS:  Has patient fallen in last 6 months? No  OCCUPATION: retired  PLOF: Independent  PATIENT GOALS: reduce pain,   NEXT MD VISIT:   OBJECTIVE:   DIAGNOSTIC FINDINGS:  Xray (right knee 4 views, left  knee 4 views): Right knee with very mild degenerative findings particularly about the medial joint space  PATIENT SURVEYS:  Eval: FOTO 67% functional   COGNITION: Overall cognitive status: Within functional limits for tasks assessed     SENSATION: WFL  MUSCLE LENGTH: Hamstrings: mildly tight at 80 deg bilat ITB tightness mildly on Rt  POSTURE:   PALPATION: Tender to palpation over Lt SIJ  LUMBAR ROM:   AROM eval  Flexion WNL  Extension WNL  Right lateral flexion WNL  Left lateral flexion WNL  Right rotation WNL  Left rotation WNL   (Blank rows = not tested)   ROM:     Active  Right eval Left eval Rt/Lt 04/28/22  Hip flexion [facility]   Hip extension     Hip abduction [facility]   Hip adduction     Hip internal rotation     Hip external rotation     Knee flexion [facility]   Knee extension [facility]   Shoulder flexion   150  Shoulder abduction   155              (Blank rows = not tested)   MMT:    MMT Right eval Left eval Rt/Lt 04/28/22 Rt/Lt 07/05/22 Rt/Lt 08/24/22  Hip flexion 4+ 4+ 5/5 5/5 5/5  Hip extension       Hip abduction 4+ 4+ 4+/4+ 4+/4+ 4+/4+  Hip adduction       Hip internal rotation       Hip external rotation       Knee flexion 5 5 5/5  5/5  Knee extension 5- 5- 5/5  5/5  Ankle dorsiflexion 4 5 4/5 4+/5          Shoulder flexion   5/5    Shoulder abduction   4+/5    Shoulder IR   5/5    Shoulder ER   4/4+     (Blank rows = not tested)  LUMBAR SPECIAL TESTS:  Eval: Straight leg raise test: Negative and Slump test: Negative Negative SIJ testing No pain with PAM testing to lumbar spine  FUNCTIONAL TESTS:  Eval: SLS X 10 seconds on left, 3-4 seconds on Rt 06/21/22: SLS 6 sec avg on Rt  GAIT: Eval: Comments: independent community Ambulator, 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  TODAY'S TREATMENT:  08/24/22 -Nu step L6 X 10 min UE/LE -Suitcase carry 25# one lap around gym (140 feet) each side. -Seated chess press machine 45# 2X12 -Row  machine 55# 3X12 -Lat pulldown machine 45# 2X12 -knee extension machine 20# 2X12 -Hamstring curl machine 35# 2X18 -Diagonal chops 35# 2X12 bilat -Leg press machine 100# 3 X12 DL,  -  thoracic extension stretch seated 6 sec X 12 -thoracic extension stretch at wall 6 sec X 12 -Kettlebell deadlift 15# 2 X 12  07/19/22 -Nu step L6 X 6 min -Seated chess press machine 35# 3X12 -Row machine 55# 3X12 -Lat pulldown machine 45# 3X12 -Diagonal chops 35# 2X12 bilat -Leg press machine 100# 2 X12 DL, then 16# 2 X 12 SL -thoracic extension stretch seated 6 sec X 12 -thoracic extension stretch at wall 6 sec X 12 -Kettlebell deadlift 15# 2 X 12   06/21/22 -recumbent bike L5 X 8 min -TRX squats and push ups 2X12 -Seated lumbar Pball roll outs for flexion stretch 6 sec hold X 12 reps -Rows with blue 2X12 -Extensions with blue 2X12 -Anti-rotation blue 2X12 bilat -Leg press DL 109# [id number] -Rocker board A-P 2 min -Single leg balance 12 sec X 6 bilat -Tandem walk on foam beam X 6  -Sidestepping on foam beam X 6      PATIENT EDUCATION: Education details: HEP, PT plan of care Person educated: Patient Education method: Explanation, Demonstration, Verbal cues, and Handouts Education comprehension: verbalized understanding and needs further education   HOME EXERCISE PROGRAM: Access Code: [email or web address] URL: [email or web address] Date: 07/05/2022 Prepared by: Ivery Quale  Exercises - Single Leg Bridge  - 2 x daily - 6 x weekly - 1-2 sets - 10 reps - 5 hold - Seated Hamstring Stretch  - 2 x daily - 6 x weekly - 1 sets - 2 reps - 30 hold - Standing ITB Stretch  - 2 x daily - 6 x weekly - 2-3 sets - 30 hold - Heel Toe Raises with Counter Support  - 2 x daily - 6 x weekly - 2-3 sets - 10 reps - Single Leg Stance  - 2 x daily - 6 x weekly - 1 sets - 5 reps - 10 sec hold - Side Stepping with Resistance at Ankles  - 2 x daily - 6 x weekly - 2-3 sets - 10 reps - Sit to Stand Without Arm  Support  - 2 x daily - 6 x weekly - 1-2 sets - 10 reps - Supine Active Straight Leg Raise  - 2 x daily - 6 x weekly - 1-2 sets - 12 reps - Sidelying Hip Abduction (Both Sides)  - 2 x daily - 6 x weekly - 1-2 sets - 12 reps - Prone Hip Extension  - 2 x daily - 6 x weekly - 1-2 sets - 12 reps - Bird Dog  - 2 x daily - 6 x weekly - 1 sets - 12 reps - 2 sec hold - Supine Piriformis Stretch with Foot on Ground  - 2 x daily - 6 x weekly - 2 sets - 30 hold - Tandem Walking  - 2 x daily - 6 x weekly - 3 sets - 12 reps - Shoulder External Rotation with Anchored Resistance  - 2 x daily - 6 x weekly - 2-3 sets - 12 reps - Shoulder External Rotation and Scapular Retraction with Resistance  - 2 x daily - 6 x weekly - 2-3 sets - 12 reps - Standing Shoulder Horizontal Abduction with Resistance  - 2 x daily - 6 x weekly - 2-3 sets - 10 reps - Standing Single Arm Shoulder Abduction with Resistance (Mirrored)  - 2 x daily - 6 x weekly - 2-3 sets - 12 reps - Heel Walking  - 2 x daily - 6 x weekly - 2-3 sets - 12 reps - Prone  Alternating Arm and Leg Lifts  - 1 x daily - 3 x weekly - 1-2 sets - 12 reps - Prone Double Leg Lift  - 1 x daily - 3 x weekly - 1-2 sets - 12 reps - Quadruped Leg Lifts  - 1 x daily - 3 x weekly - 1-2 sets - 12 reps - Plank on Knees  - 1 x daily - 3 x weekly - 1 sets - 6 reps - 20-30 sec hold - Seated Thoracic Lumbar Extension with Pectoralis Stretch  - 1 x daily - 3 x weekly - 1 sets - 12 reps - 6 sec hold - Standing Thoracic Extension at Wall  - 1 x daily - 3 x weekly - 1 sets - 12 reps - Half Deadlift with Kettlebell  - 1 x daily - 3 x weekly - 3 sets - 10 reps - 6 sec hold - Standing Diagonal Chop  - 1 x daily - 3 x weekly - 2 sets - 12 reps - Anti-Rotation Press With Sidesteps and Anchored Resistance  - 1 x daily - 3 x weekly - 2 sets - 10 reps  ASSESSMENT:  CLINICAL IMPRESSION: He returns to PT for follow up and he feels he was losing some strength in his legs. I did recheck his  strength and he does not appear to have lost any but he may be losing some endurance in his legs and he will continue to benefit from strength and endurance program and we reviewed gym equipment that he can use to help achieve this outside of PT.  OBJECTIVE IMPAIRMENTS: decreased activity tolerance, difficulty walking, decreased balance, decreased endurance, decreased mobility, decreased ROM, decreased strength, impaired flexibility, impaired LE use, postural dysfunction, and pain.  ACTIVITY LIMITATIONS: bending, lifting, carry, locomotion, cleaning, community activity,  PERSONAL FACTORS: childhood polio, OA are also affecting patient's functional outcome.  REHAB POTENTIAL: Good  CLINICAL DECISION MAKING: Stable/uncomplicated  EVALUATION COMPLEXITY: Low    GOALS: Short term PT Goals Target date: 03/07/2022   Pt will be I and compliant with HEP. Baseline:  Goal status: MET 03/07/22 Pt will decrease pain by 25% overall Baseline: Goal status: MET 03/07/22  Long term PT goals Target date:09/13/22   Pt will improve  hip/knee strength to at least 5-/5 MMT to improve functional strength Baseline: Goal status: ongoing, 4 to 4+ for Rt ankle DF and hip abd on avg 06/21/22 Pt will improve FOTO to at least 69% functional to show improved function Baseline: Goal status: DC this goal as foto was closed out. Pt will reduce pain to overall less than 2-3/10 with usual activity and kayaking Baseline: Goal status: ongoing 06/21/22, has been having more back pain  4. Pt will be able to hold SLS X 10 seconds on avg for his Rt leg to show improved balance.  Baseline: 3-4 seconds on Rt Goal status: ongoing 06/21/22  PLAN: PT FREQUENCY: 1 times per 2 weeks  PT DURATION: 12 weeks  PLANNED INTERVENTIONS (unless contraindicated): aquatic PT, Canalith repositioning, cryotherapy, Electrical stimulation, Iontophoresis with 4 mg/ml dexamethasome, Moist heat, traction, Ultrasound, gait training,  Therapeutic exercise, balance training, neuromuscular re-education, patient/family education, prosthetic training, manual techniques, passive ROM, dry needling, taping, vasopnuematic device, vestibular, spinal manipulations, joint manipulations  PLAN FOR NEXT SESSION: lumbar/core strength, leg strength progressions as tolerated. Functional endurance. Has he been going to [facility]?  April Manson, PT,DPT 08/24/2022, 2:00 PM

## 2022-09-20 ENCOUNTER — Ambulatory Visit: Payer: Medicare Other | Admitting: Cardiovascular Disease

## 2022-09-20 ENCOUNTER — Encounter: Payer: Self-pay | Admitting: Cardiovascular Disease

## 2022-09-20 ENCOUNTER — Encounter (INDEPENDENT_AMBULATORY_CARE_PROVIDER_SITE_OTHER): Payer: Medicare Other | Admitting: Family Medicine

## 2022-09-20 DIAGNOSIS — U071 COVID-19: Secondary | ICD-10-CM | POA: Diagnosis not present

## 2022-09-20 DIAGNOSIS — R509 Fever, unspecified: Secondary | ICD-10-CM | POA: Diagnosis not present

## 2022-09-20 NOTE — Telephone Encounter (Signed)

## 2022-09-21 ENCOUNTER — Encounter: Payer: Medicare Other | Admitting: Physical Therapy

## 2022-10-05 ENCOUNTER — Ambulatory Visit (INDEPENDENT_AMBULATORY_CARE_PROVIDER_SITE_OTHER): Payer: Medicare Other | Admitting: Physical Therapy

## 2022-10-05 ENCOUNTER — Encounter: Payer: Self-pay | Admitting: Physical Therapy

## 2022-10-05 DIAGNOSIS — M6281 Muscle weakness (generalized): Secondary | ICD-10-CM

## 2022-10-05 DIAGNOSIS — M25561 Pain in right knee: Secondary | ICD-10-CM | POA: Diagnosis not present

## 2022-10-05 DIAGNOSIS — M5459 Other low back pain: Secondary | ICD-10-CM | POA: Diagnosis not present

## 2022-10-05 DIAGNOSIS — G8929 Other chronic pain: Secondary | ICD-10-CM

## 2022-10-05 DIAGNOSIS — R262 Difficulty in walking, not elsewhere classified: Secondary | ICD-10-CM

## 2022-10-05 DIAGNOSIS — M25552 Pain in left hip: Secondary | ICD-10-CM

## 2022-10-05 DIAGNOSIS — M25511 Pain in right shoulder: Secondary | ICD-10-CM | POA: Diagnosis not present

## 2022-10-05 NOTE — Therapy (Signed)
OUTPATIENT PHYSICAL THERAPY TREATMENT/RECERT   Patient Name: Jonathan Garrison MRN: [id number] DOB:06-23-1945, 77 y.o., male Today's Date: 10/05/2022  END OF SESSION:  PT End of Session - 10/05/22 1357     Visit Number 14    Number of Visits 20    Date for PT Re-Evaluation 12/28/22    Authorization Type MCR    Progress Note Due on Visit 16    PT Start Time 1345    PT Stop Time 1430    PT Time Calculation (min) 45 min    Activity Tolerance Patient tolerated treatment well    Behavior During Therapy WFL for tasks assessed/performed                 Past Medical History:  Diagnosis Date   ADD (attention deficit disorder) without hyperactivity    Allergy    Anemia    no treatment   Anxiety    Arthralgia    many joints, managed with vitamins/herbs and ibuprofen   Arthritis    Cataract    Chest pain    Colitis 1986   single attack (believes was ulcerative)   Depression    Eczema    Esophageal stricture    GERD (gastroesophageal reflux disease)    Hemorrhoids    Lichen planus    Neuromuscular disorder (HCC)    POLIO AGE 35   Oral cancer (HCC) 05/2010   plans for surgery 06/2010   Osteoporosis    hips   Pleurisy 1984   Polio age 80   Umbilical hernia    Past Surgical History:  Procedure Laterality Date   COLONOSCOPY  2012   LEG SURGERY  when in 6th grade   L leg, bone spur removal (just above knee, laterally)   TONGUE SURGERY     for tongue cancer   UPPER GASTROINTESTINAL ENDOSCOPY     Patient Active Problem List   Diagnosis Date Noted   Hyperlipidemia 05/25/2022   Localized osteoarthritis of both shoulders 03/22/2022   Lower back pain 03/22/2022   Neck pain, chronic 03/22/2022   IT band syndrome 03/22/2022   Essential hypertension 02/22/2022   Family history of heart disease 02/22/2022   Trochanteric bursitis of left hip 08/01/2021   Pain of both hip joints 07/20/2021   Seasonal allergic rhinitis 05/24/2021   ETD (Eustachian tube dysfunction),  bilateral 05/24/2021   Gastroesophageal reflux disease without esophagitis 12/15/2017   Dysphagia 12/15/2017   History of oral cancer 12/15/2017   Increased prostate specific antigen (PSA) velocity 11/27/2017   Osteopenia 08/01/2017   Vitamin B12 deficiency anemia 06/09/2014   Chest pain 03/13/2013   Depression with anxiety 08/26/2011   Overweight (BMI 25.0-29.9) 08/26/2011   Joint pain 08/26/2011   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 Rt knee pain, Rt shoulder sprain  Rationale for Evaluation and Treatment: Rehabilitation  THERAPY DIAG:  Other low back pain  Pain in left hip  Chronic pain of right knee  Difficulty in walking, not elsewhere classified  Acute pain of right shoulder  Muscle weakness (generalized)  ONSET DATE: chronic pain for years  SUBJECTIVE:  SUBJECTIVE STATEMENT: He has missed PT due to having covid, his shoulder and knee are doing better but having some hip/back pain that is worse first thing in the morning then improves as he walks and stretches.   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 Yes: NPRS scale: see above statement/10 Pain location: back Pain description: ache Aggravating factors: stairs, worse first thing in the morning Relieving factors: meloxicam, voltaren  PRECAUTIONS: None  WEIGHT BEARING RESTRICTIONS: No  FALLS:  Has patient fallen in last 6 months? No  OCCUPATION: retired  PLOF: Independent  PATIENT GOALS: reduce pain,   NEXT MD VISIT:   OBJECTIVE:   DIAGNOSTIC FINDINGS:  Xray (right knee 4 views, left knee 4  views): Right knee with very mild degenerative findings particularly about the medial joint space  PATIENT SURVEYS:  Eval: FOTO 67% functional  , FOTO was discharged?  COGNITION: Overall cognitive status: Within functional limits for tasks assessed     SENSATION: WFL  MUSCLE LENGTH: Hamstrings: mildly tight at 80 deg bilat ITB tightness mildly on Rt  POSTURE:   PALPATION: Tender to palpation over Lt SIJ  LUMBAR ROM:   AROM eval  Flexion WNL  Extension WNL  Right lateral flexion WNL  Left lateral flexion WNL  Right rotation WNL  Left rotation WNL   (Blank rows = not tested)   ROM:     Active  Right eval Left eval Rt/Lt 04/28/22 Right/Left 10/05/22  Hip flexion [facility]    Hip extension      Hip abduction [facility]    Hip adduction      Hip internal rotation      Hip external rotation      Knee flexion [facility]    Knee extension [facility]    Shoulder flexion   150 160 bilat  Shoulder abduction   155  160 bilat               (Blank rows = not tested)   MMT:    MMT Right eval Left eval Rt/Lt 04/28/22 Rt/Lt 07/05/22 Rt/Lt 08/24/22  Hip flexion 4+ 4+ 5/5 5/5 5/5  Hip extension       Hip abduction 4+ 4+ 4+/4+ 4+/4+ 4+/4+  Hip adduction       Hip internal rotation       Hip external rotation       Knee flexion 5 5 5/5  5/5  Knee extension 5- 5- 5/5  5/5  Ankle dorsiflexion 4 5 4/5 4+/5          Shoulder flexion   5/5    Shoulder abduction   4+/5    Shoulder IR   5/5    Shoulder ER   4/4+     (Blank rows = not tested)  LUMBAR SPECIAL TESTS:  Eval: Straight leg raise test: Negative and Slump test: Negative Negative SIJ testing No pain with PAM testing to lumbar spine  FUNCTIONAL TESTS:  Eval: SLS X 10 seconds on left, 3-4 seconds on Rt 06/21/22: SLS 6 sec avg on Rt  GAIT: Eval: Comments: independent community Ambulator, 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  TODAY'S TREATMENT:  10/05/22 -Supermans, alternate and opposite shoulder/hip extension 2 X  12 -Deadbugs X 12 bilat -supine leg raises X 12 horizontal, X 12 vertical/scissor -Doorway pec strech 6 sec X 12 -Suitcase carry 30# 40 feet) X 3 each side. -Deadlift floor to waist 30# 2X12 with cues for form -  Row machine 55# [id number] with cues for form -Diagonal chops 35# 2X12 bilat with cues for form    PATIENT EDUCATION: Education details: HEP, PT plan of care Person educated: Patient Education method: Explanation, Demonstration, Verbal cues, and Handouts Education comprehension: verbalized understanding and needs further education   HOME EXERCISE PROGRAM: Access Code: [email or web address] URL: [email or web address] Date: 07/05/2022 Prepared by: Ivery Quale  Exercises - Single Leg Bridge  - 2 x daily - 6 x weekly - 1-2 sets - 10 reps - 5 hold - Seated Hamstring Stretch  - 2 x daily - 6 x weekly - 1 sets - 2 reps - 30 hold - Standing ITB Stretch  - 2 x daily - 6 x weekly - 2-3 sets - 30 hold - Heel Toe Raises with Counter Support  - 2 x daily - 6 x weekly - 2-3 sets - 10 reps - Single Leg Stance  - 2 x daily - 6 x weekly - 1 sets - 5 reps - 10 sec hold - Side Stepping with Resistance at Ankles  - 2 x daily - 6 x weekly - 2-3 sets - 10 reps - Sit to Stand Without Arm Support  - 2 x daily - 6 x weekly - 1-2 sets - 10 reps - Supine Active Straight Leg Raise  - 2 x daily - 6 x weekly - 1-2 sets - 12 reps - Sidelying Hip Abduction (Both Sides)  - 2 x daily - 6 x weekly - 1-2 sets - 12 reps - Prone Hip Extension  - 2 x daily - 6 x weekly - 1-2 sets - 12 reps - Bird Dog  - 2 x daily - 6 x weekly - 1 sets - 12 reps - 2 sec hold - Supine Piriformis Stretch with Foot on Ground  - 2 x daily - 6 x weekly - 2 sets - 30 hold - Tandem Walking  - 2 x daily - 6 x weekly - 3 sets - 12 reps - Shoulder External Rotation with Anchored Resistance  - 2 x daily - 6 x weekly - 2-3 sets - 12 reps - Shoulder External Rotation and Scapular Retraction with Resistance  - 2 x daily - 6 x weekly - 2-3 sets  - 12 reps - Standing Shoulder Horizontal Abduction with Resistance  - 2 x daily - 6 x weekly - 2-3 sets - 10 reps - Standing Single Arm Shoulder Abduction with Resistance (Mirrored)  - 2 x daily - 6 x weekly - 2-3 sets - 12 reps - Heel Walking  - 2 x daily - 6 x weekly - 2-3 sets - 12 reps - Prone Alternating Arm and Leg Lifts  - 1 x daily - 3 x weekly - 1-2 sets - 12 reps - Prone Double Leg Lift  - 1 x daily - 3 x weekly - 1-2 sets - 12 reps - Quadruped Leg Lifts  - 1 x daily - 3 x weekly - 1-2 sets - 12 reps - Plank on Knees  - 1 x daily - 3 x weekly - 1 sets - 6 reps - 20-30 sec hold - Seated Thoracic Lumbar Extension with Pectoralis Stretch  - 1 x daily - 3 x weekly - 1 sets - 12 reps - 6 sec hold - Standing Thoracic Extension at Wall  - 1 x daily - 3 x weekly - 1 sets - 12 reps - Half Deadlift with Kettlebell  - 1 x daily - 3 x weekly -  3 sets - 10 reps - 6 sec hold - Standing Diagonal Chop  - 1 x daily - 3 x weekly - 2 sets - 12 reps - Anti-Rotation Press With Sidesteps and Anchored Resistance  - 1 x daily - 3 x weekly - 2 sets - 10 reps  ASSESSMENT:  CLINICAL IMPRESSION: He returns to PT after long layoff from PT due to having covid. He is having some back pain and wanted to learn more core strenghtening work he can do at home. I showed him additional exercises and did recert today to extend his PT plan of care to work toward his long term PT goals.  OBJECTIVE IMPAIRMENTS: decreased activity tolerance, difficulty walking, decreased balance, decreased endurance, decreased mobility, decreased ROM, decreased strength, impaired flexibility, impaired LE use, postural dysfunction, and pain.  ACTIVITY LIMITATIONS: bending, lifting, carry, locomotion, cleaning, community activity,  PERSONAL FACTORS: childhood polio, OA are also affecting patient's functional outcome.  REHAB POTENTIAL: Good  CLINICAL DECISION MAKING: Stable/uncomplicated  EVALUATION COMPLEXITY: Low    GOALS: Short term  PT Goals Target date: 03/07/2022   Pt will be I and compliant with HEP. Baseline:  Goal status: MET 03/07/22 Pt will decrease pain by 25% overall Baseline: Goal status: MET 03/07/22  Long term PT goals Target date:12/28/22   Pt will improve  hip/knee strength to at least 5-/5 MMT to improve functional strength Baseline: Goal status: ongoing, 4 to 4+ for Rt ankle DF and hip abd on avg9/11/24 Pt will improve FOTO to at least 69% functional to show improved function Baseline: Goal status: DC this goal as foto was closed out. Pt will reduce pain to overall less than 2-3/10 with usual activity and kayaking Baseline: Goal status: ongoing 10/05/22, has been having more back pain  4. Pt will be able to hold SLS X 10 seconds on avg for his Rt leg to show improved balance.  Baseline: 3-4 seconds on Rt Goal status: ongoing 10/05/22  PLAN: PT FREQUENCY: 1 times per 2 weeks  PT DURATION: 12 weeks  PLANNED INTERVENTIONS (unless contraindicated): aquatic PT, Canalith repositioning, cryotherapy, Electrical stimulation, Iontophoresis with 4 mg/ml dexamethasome, Moist heat, traction, Ultrasound, gait training, Therapeutic exercise, balance training, neuromuscular re-education, patient/family education, prosthetic training, manual techniques, passive ROM, dry needling, taping, vasopnuematic device, vestibular, spinal manipulations, joint manipulations  PLAN FOR NEXT SESSION: lumbar/core strength, leg strength progressions as tolerated. Functional endurance.  April Manson, PT,DPT 10/05/2022, 1:59 PM

## 2022-10-06 DIAGNOSIS — F422 Mixed obsessional thoughts and acts: Secondary | ICD-10-CM | POA: Diagnosis not present

## 2022-10-06 DIAGNOSIS — F410 Panic disorder [episodic paroxysmal anxiety] without agoraphobia: Secondary | ICD-10-CM | POA: Diagnosis not present

## 2022-10-13 DIAGNOSIS — K1329 Other disturbances of oral epithelium, including tongue: Secondary | ICD-10-CM | POA: Diagnosis not present

## 2022-10-13 DIAGNOSIS — Z09 Encounter for follow-up examination after completed treatment for conditions other than malignant neoplasm: Secondary | ICD-10-CM | POA: Diagnosis not present

## 2022-10-18 DIAGNOSIS — D225 Melanocytic nevi of trunk: Secondary | ICD-10-CM | POA: Diagnosis not present

## 2022-10-18 DIAGNOSIS — D485 Neoplasm of uncertain behavior of skin: Secondary | ICD-10-CM | POA: Diagnosis not present

## 2022-10-18 DIAGNOSIS — Z1283 Encounter for screening for malignant neoplasm of skin: Secondary | ICD-10-CM | POA: Diagnosis not present

## 2022-10-18 DIAGNOSIS — L218 Other seborrheic dermatitis: Secondary | ICD-10-CM | POA: Diagnosis not present

## 2022-10-18 DIAGNOSIS — D2272 Melanocytic nevi of left lower limb, including hip: Secondary | ICD-10-CM | POA: Diagnosis not present

## 2022-10-19 ENCOUNTER — Ambulatory Visit (INDEPENDENT_AMBULATORY_CARE_PROVIDER_SITE_OTHER): Payer: Medicare Other | Admitting: Physical Therapy

## 2022-10-19 ENCOUNTER — Encounter: Payer: Self-pay | Admitting: Physical Therapy

## 2022-10-19 DIAGNOSIS — R262 Difficulty in walking, not elsewhere classified: Secondary | ICD-10-CM | POA: Diagnosis not present

## 2022-10-19 DIAGNOSIS — M25561 Pain in right knee: Secondary | ICD-10-CM

## 2022-10-19 DIAGNOSIS — M6281 Muscle weakness (generalized): Secondary | ICD-10-CM | POA: Diagnosis not present

## 2022-10-19 DIAGNOSIS — G8929 Other chronic pain: Secondary | ICD-10-CM

## 2022-10-19 DIAGNOSIS — M25552 Pain in left hip: Secondary | ICD-10-CM

## 2022-10-19 DIAGNOSIS — M25511 Pain in right shoulder: Secondary | ICD-10-CM

## 2022-10-19 DIAGNOSIS — M5459 Other low back pain: Secondary | ICD-10-CM

## 2022-10-19 NOTE — Therapy (Signed)
OUTPATIENT PHYSICAL THERAPY TREATMENT   Patient Name: Benuel Veloz MRN: [id number] DOB:06/27/1945, 77 y.o., male Today's Date: 10/19/2022  END OF SESSION:  PT End of Session - 10/19/22 1344     Visit Number 15    Number of Visits 20    Date for PT Re-Evaluation 12/28/22    Authorization Type MCR    Progress Note Due on Visit 16    PT Start Time 1345    PT Stop Time 1425    PT Time Calculation (min) 40 min    Activity Tolerance Patient tolerated treatment well    Behavior During Therapy WFL for tasks assessed/performed                 Past Medical History:  Diagnosis Date   ADD (attention deficit disorder) without hyperactivity    Allergy    Anemia    no treatment   Anxiety    Arthralgia    many joints, managed with vitamins/herbs and ibuprofen   Arthritis    Cataract    Chest pain    Colitis 1986   single attack (believes was ulcerative)   Depression    Eczema    Esophageal stricture    GERD (gastroesophageal reflux disease)    Hemorrhoids    Lichen planus    Neuromuscular disorder (HCC)    POLIO AGE 68   Oral cancer (HCC) 05/2010   plans for surgery 06/2010   Osteoporosis    hips   Pleurisy 1984   Polio age 70   Umbilical hernia    Past Surgical History:  Procedure Laterality Date   COLONOSCOPY  2012   LEG SURGERY  when in 6th grade   L leg, bone spur removal (just above knee, laterally)   TONGUE SURGERY     for tongue cancer   UPPER GASTROINTESTINAL ENDOSCOPY     Patient Active Problem List   Diagnosis Date Noted   Hyperlipidemia 05/25/2022   Localized osteoarthritis of both shoulders 03/22/2022   Lower back pain 03/22/2022   Neck pain, chronic 03/22/2022   IT band syndrome 03/22/2022   Essential hypertension 02/22/2022   Family history of heart disease 02/22/2022   Trochanteric bursitis of left hip 08/01/2021   Pain of both hip joints 07/20/2021   Seasonal allergic rhinitis 05/24/2021   ETD (Eustachian tube dysfunction), bilateral  05/24/2021   Gastroesophageal reflux disease without esophagitis 12/15/2017   Dysphagia 12/15/2017   History of oral cancer 12/15/2017   Increased prostate specific antigen (PSA) velocity 11/27/2017   Osteopenia 08/01/2017   Vitamin B12 deficiency anemia 06/09/2014   Chest pain 03/13/2013   Depression with anxiety 08/26/2011   Overweight (BMI 25.0-29.9) 08/26/2011   Joint pain 08/26/2011   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 Rt knee pain, Rt shoulder sprain  Rationale for Evaluation and Treatment: Rehabilitation  THERAPY DIAG:  Other low back pain  Pain in left hip  Chronic pain of right knee  Difficulty in walking, not elsewhere classified  Acute pain of right shoulder  Muscle weakness (generalized)  ONSET DATE: chronic pain for years  SUBJECTIVE:  SUBJECTIVE STATEMENT: He has been having more overall back pain lately, he is not comfortable trying DN and wants to work on stretching, and core strength instead  PERTINENT HISTORY:  Polio as child, OA  PAIN:  Are you having pain? Yes: NPRS scale: 6/10 Pain location: back and hip Pain description: ache Aggravating factors: stairs, worse first thing in the morning Relieving factors: meloxicam, voltaren  PRECAUTIONS: None  WEIGHT BEARING RESTRICTIONS: No  FALLS:  Has patient fallen in last 6 months? No  OCCUPATION: retired  PLOF: Independent  PATIENT GOALS: reduce pain,   NEXT MD VISIT:   OBJECTIVE:   DIAGNOSTIC FINDINGS:  Xray (right knee 4 views, left knee 4 views): Right knee with very mild degenerative findings particularly about the  medial joint space  PATIENT SURVEYS:  Eval: FOTO 67% functional  , FOTO was discharged?  COGNITION: Overall cognitive status: Within functional limits for tasks assessed     SENSATION: WFL  MUSCLE LENGTH: Hamstrings: mildly tight at 80 deg bilat ITB tightness mildly on Rt  POSTURE:   PALPATION: Tender to palpation over Lt SIJ  LUMBAR ROM:   AROM eval  Flexion WNL  Extension WNL  Right lateral flexion WNL  Left lateral flexion WNL  Right rotation WNL  Left rotation WNL   (Blank rows = not tested)   ROM:     Active  Right eval Left eval Rt/Lt 04/28/22 Right/Left 10/05/22  Hip flexion [facility]    Hip extension      Hip abduction [facility]    Hip adduction      Hip internal rotation      Hip external rotation      Knee flexion [facility]    Knee extension [facility]    Shoulder flexion   150 160 bilat  Shoulder abduction   155  160 bilat               (Blank rows = not tested)   MMT:    MMT Right eval Left eval Rt/Lt 04/28/22 Rt/Lt 07/05/22 Rt/Lt 08/24/22  Hip flexion 4+ 4+ 5/5 5/5 5/5  Hip extension       Hip abduction 4+ 4+ 4+/4+ 4+/4+ 4+/4+  Hip adduction       Hip internal rotation       Hip external rotation       Knee flexion 5 5 5/5  5/5  Knee extension 5- 5- 5/5  5/5  Ankle dorsiflexion 4 5 4/5 4+/5          Shoulder flexion   5/5    Shoulder abduction   4+/5    Shoulder IR   5/5    Shoulder ER   4/4+     (Blank rows = not tested)  LUMBAR SPECIAL TESTS:  Eval: Straight leg raise test: Negative and Slump test: Negative Negative SIJ testing No pain with PAM testing to lumbar spine  FUNCTIONAL TESTS:  Eval: SLS X 10 seconds on left, 3-4 seconds on Rt 06/21/22: SLS 6 sec avg on Rt  GAIT: Eval: Comments: independent community Ambulator, 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  TODAY'S TREATMENT:  10/19/22 -Nu step L7 X 6 min UE/LE seat #14 -Lumbar L stretch 5 sec X 12 -Anti rotation press blue 2X 12 bilat -Suitcase/Farmers carry 30# 75 feet 3 laps each  arm -Doorway pec strech 6 sec X 12 -Suitcase carry 30# 40 feet) X 3 each side. -Deadlift floor to waist 30# 2X12 with cues for form -Row  machine 55# 2X12 with cues for form -Lat pulldown machine 45# 2X12 -Diagonal chops 35# 2X12 bilat with cues for form -Supermans, alternate and opposite shoulder/hip extension 2 X 12 -Deadbugs X 12 bilat -supine leg raises X 12 horizontal, X 12 vertical/scissor    PATIENT EDUCATION: Education details: HEP, PT plan of care Person educated: Patient Education method: Explanation, Demonstration, Verbal cues, and Handouts Education comprehension: verbalized understanding and needs further education   HOME EXERCISE PROGRAM: Access Code: [email or web address] URL: [email or web address] Date: 07/05/2022 Prepared by: Ivery Quale  Exercises - Single Leg Bridge  - 2 x daily - 6 x weekly - 1-2 sets - 10 reps - 5 hold - Seated Hamstring Stretch  - 2 x daily - 6 x weekly - 1 sets - 2 reps - 30 hold - Standing ITB Stretch  - 2 x daily - 6 x weekly - 2-3 sets - 30 hold - Heel Toe Raises with Counter Support  - 2 x daily - 6 x weekly - 2-3 sets - 10 reps - Single Leg Stance  - 2 x daily - 6 x weekly - 1 sets - 5 reps - 10 sec hold - Side Stepping with Resistance at Ankles  - 2 x daily - 6 x weekly - 2-3 sets - 10 reps - Sit to Stand Without Arm Support  - 2 x daily - 6 x weekly - 1-2 sets - 10 reps - Supine Active Straight Leg Raise  - 2 x daily - 6 x weekly - 1-2 sets - 12 reps - Sidelying Hip Abduction (Both Sides)  - 2 x daily - 6 x weekly - 1-2 sets - 12 reps - Prone Hip Extension  - 2 x daily - 6 x weekly - 1-2 sets - 12 reps - Bird Dog  - 2 x daily - 6 x weekly - 1 sets - 12 reps - 2 sec hold - Supine Piriformis Stretch with Foot on Ground  - 2 x daily - 6 x weekly - 2 sets - 30 hold - Tandem Walking  - 2 x daily - 6 x weekly - 3 sets - 12 reps - Shoulder External Rotation with Anchored Resistance  - 2 x daily - 6 x weekly - 2-3 sets - 12 reps -  Shoulder External Rotation and Scapular Retraction with Resistance  - 2 x daily - 6 x weekly - 2-3 sets - 12 reps - Standing Shoulder Horizontal Abduction with Resistance  - 2 x daily - 6 x weekly - 2-3 sets - 10 reps - Standing Single Arm Shoulder Abduction with Resistance (Mirrored)  - 2 x daily - 6 x weekly - 2-3 sets - 12 reps - Heel Walking  - 2 x daily - 6 x weekly - 2-3 sets - 12 reps - Prone Alternating Arm and Leg Lifts  - 1 x daily - 3 x weekly - 1-2 sets - 12 reps - Prone Double Leg Lift  - 1 x daily - 3 x weekly - 1-2 sets - 12 reps - Quadruped Leg Lifts  - 1 x daily - 3 x weekly - 1-2 sets - 12 reps - Plank on Knees  - 1 x daily - 3 x weekly - 1 sets - 6 reps - 20-30 sec hold - Seated Thoracic Lumbar Extension with Pectoralis Stretch  - 1 x daily - 3 x weekly - 1 sets - 12 reps - 6 sec hold - Standing Thoracic Extension at Wall  - 1 x  daily - 3 x weekly - 1 sets - 12 reps - Half Deadlift with Kettlebell  - 1 x daily - 3 x weekly - 3 sets - 10 reps - 6 sec hold - Standing Diagonal Chop  - 1 x daily - 3 x weekly - 2 sets - 12 reps - Anti-Rotation Press With Sidesteps and Anchored Resistance  - 1 x daily - 3 x weekly - 2 sets - 10 reps  ASSESSMENT:  CLINICAL IMPRESSION: He was having more overall back pain upon arrival but good tolerance to PT exercises today focusing on core strength and mobility. He report he felt better after session and will continue to benefit from skilled PT  OBJECTIVE IMPAIRMENTS: decreased activity tolerance, difficulty walking, decreased balance, decreased endurance, decreased mobility, decreased ROM, decreased strength, impaired flexibility, impaired LE use, postural dysfunction, and pain.  ACTIVITY LIMITATIONS: bending, lifting, carry, locomotion, cleaning, community activity,  PERSONAL FACTORS: childhood polio, OA are also affecting patient's functional outcome.  REHAB POTENTIAL: Good  CLINICAL DECISION MAKING: Stable/uncomplicated  EVALUATION  COMPLEXITY: Low    GOALS: Short term PT Goals Target date: 03/07/2022   Pt will be I and compliant with HEP. Baseline:  Goal status: MET 03/07/22 Pt will decrease pain by 25% overall Baseline: Goal status: MET 03/07/22  Long term PT goals Target date:12/28/22   Pt will improve  hip/knee strength to at least 5-/5 MMT to improve functional strength Baseline: Goal status: ongoing, 4 to 4+ for Rt ankle DF and hip abd on avg9/11/24 Pt will improve FOTO to at least 69% functional to show improved function Baseline: Goal status: DC this goal as foto was closed out. Pt will reduce pain to overall less than 2-3/10 with usual activity and kayaking Baseline: Goal status: ongoing 10/05/22, has been having more back pain  4. Pt will be able to hold SLS X 10 seconds on avg for his Rt leg to show improved balance.  Baseline: 3-4 seconds on Rt Goal status: ongoing 10/05/22  PLAN: PT FREQUENCY: 1 times per 2 weeks  PT DURATION: 12 weeks  PLANNED INTERVENTIONS (unless contraindicated): aquatic PT, Canalith repositioning, cryotherapy, Electrical stimulation, Iontophoresis with 4 mg/ml dexamethasome, Moist heat, traction, Ultrasound, gait training, Therapeutic exercise, balance training, neuromuscular re-education, patient/family education, prosthetic training, manual techniques, passive ROM, dry needling, taping, vasopnuematic device, vestibular, spinal manipulations, joint manipulations  PLAN FOR NEXT SESSION: lumbar/core strength, leg strength progressions as tolerated. Functional endurance.  April Manson, PT,DPT 10/19/2022, 1:45 PM

## 2022-11-01 DIAGNOSIS — H40013 Open angle with borderline findings, low risk, bilateral: Secondary | ICD-10-CM | POA: Diagnosis not present

## 2022-11-02 ENCOUNTER — Encounter: Payer: Self-pay | Admitting: Physical Therapy

## 2022-11-02 ENCOUNTER — Ambulatory Visit: Payer: Medicare Other | Admitting: Physical Therapy

## 2022-11-02 DIAGNOSIS — R262 Difficulty in walking, not elsewhere classified: Secondary | ICD-10-CM | POA: Diagnosis not present

## 2022-11-02 DIAGNOSIS — G8929 Other chronic pain: Secondary | ICD-10-CM

## 2022-11-02 DIAGNOSIS — M25552 Pain in left hip: Secondary | ICD-10-CM

## 2022-11-02 DIAGNOSIS — M25561 Pain in right knee: Secondary | ICD-10-CM

## 2022-11-02 DIAGNOSIS — M25511 Pain in right shoulder: Secondary | ICD-10-CM | POA: Diagnosis not present

## 2022-11-02 DIAGNOSIS — M5459 Other low back pain: Secondary | ICD-10-CM | POA: Diagnosis not present

## 2022-11-02 NOTE — Therapy (Signed)
OUTPATIENT PHYSICAL THERAPY TREATMENT Progress Note reporting period 05/18/22 to 11/02/22  See below for objective and subjective measurements relating to patients progress with PT.    Patient Name: Jonathan Garrison MRN: [id number] DOB:August 20, 1945, 77 y.o., male Today's Date: 11/02/2022  END OF SESSION:  PT End of Session - 11/02/22 1351     Visit Number 16    Number of Visits 20    Date for PT Re-Evaluation 12/28/22    Authorization Type MCR    Progress Note Due on Visit 26    PT Start Time 1340    PT Stop Time 1420    PT Time Calculation (min) 40 min    Activity Tolerance Patient tolerated treatment well    Behavior During Therapy WFL for tasks assessed/performed                 Past Medical History:  Diagnosis Date   ADD (attention deficit disorder) without hyperactivity    Allergy    Anemia    no treatment   Anxiety    Arthralgia    many joints, managed with vitamins/herbs and ibuprofen   Arthritis    Cataract    Chest pain    Colitis 1986   single attack (believes was ulcerative)   Depression    Eczema    Esophageal stricture    GERD (gastroesophageal reflux disease)    Hemorrhoids    Lichen planus    Neuromuscular disorder (HCC)    POLIO AGE 19   Oral cancer (HCC) 05/2010   plans for surgery 06/2010   Osteoporosis    hips   Pleurisy 1984   Polio age 39   Umbilical hernia    Past Surgical History:  Procedure Laterality Date   COLONOSCOPY  2012   LEG SURGERY  when in 6th grade   L leg, bone spur removal (just above knee, laterally)   TONGUE SURGERY     for tongue cancer   UPPER GASTROINTESTINAL ENDOSCOPY     Patient Active Problem List   Diagnosis Date Noted   Hyperlipidemia 05/25/2022   Localized osteoarthritis of both shoulders 03/22/2022   Lower back pain 03/22/2022   Neck pain, chronic 03/22/2022   IT band syndrome 03/22/2022   Essential hypertension 02/22/2022   Family history of heart disease 02/22/2022   Trochanteric bursitis of  left hip 08/01/2021   Pain of both hip joints 07/20/2021   Seasonal allergic rhinitis 05/24/2021   ETD (Eustachian tube dysfunction), bilateral 05/24/2021   Gastroesophageal reflux disease without esophagitis 12/15/2017   Dysphagia 12/15/2017   History of oral cancer 12/15/2017   Increased prostate specific antigen (PSA) velocity 11/27/2017   Osteopenia 08/01/2017   Vitamin B12 deficiency anemia 06/09/2014   Chest pain 03/13/2013   Depression with anxiety 08/26/2011   Overweight (BMI 25.0-29.9) 08/26/2011   Joint pain 08/26/2011   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 Rt knee pain, Rt shoulder sprain  Rationale for Evaluation and Treatment: Rehabilitation  THERAPY DIAG:  Other low back pain  Pain in left hip  Chronic pain of right knee  Difficulty in walking, not elsewhere classified  Acute pain of right shoulder  ONSET DATE: chronic pain for years  SUBJECTIVE:  SUBJECTIVE STATEMENT: He has some soreness and general pain in his Rt shoulder and back today upon arrival. 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 Yes: NPRS scale: 6/10 Pain location: back and hip Pain description: ache Aggravating factors: stairs, worse first thing in the morning Relieving factors: meloxicam, voltaren  PRECAUTIONS: None  WEIGHT BEARING RESTRICTIONS: No  FALLS:  Has patient fallen in last 6 months? No  OCCUPATION: retired  PLOF: Independent  PATIENT GOALS: reduce pain,   NEXT MD VISIT:   OBJECTIVE:   DIAGNOSTIC FINDINGS:  Xray (right knee 4 views, left knee 4 views): Right  knee with very mild degenerative findings particularly about the medial joint space  PATIENT SURVEYS:  Eval: FOTO 67% functional  , FOTO was discharged?  COGNITION: Overall cognitive status: Within functional limits for tasks assessed     SENSATION: WFL  MUSCLE LENGTH: Hamstrings: mildly tight at 80 deg bilat ITB tightness mildly on Rt  POSTURE:   PALPATION: Tender to palpation over Lt SIJ  LUMBAR ROM:   AROM eval  Flexion WNL  Extension WNL  Right lateral flexion WNL  Left lateral flexion WNL  Right rotation WNL  Left rotation WNL   (Blank rows = not tested)   ROM:     Active  Right eval Left eval Rt/Lt 04/28/22 Right/Left 10/05/22 Right 11/02/22  Hip flexion [facility]     Hip extension       Hip abduction [facility]     Hip adduction       Hip internal rotation       Hip external rotation       Knee flexion [facility]     Knee extension [facility]     Shoulder flexion   150 160 bilat WFL  Shoulder abduction   155  160 bilat WFL                 (Blank rows = not tested)   MMT:    MMT Right eval Left eval Rt/Lt 04/28/22 Rt/Lt 07/05/22 Rt/Lt 08/24/22 Rt/Lt 11/02/22  Hip flexion 4+ 4+ 5/5 5/5 5/5   Hip extension        Hip abduction 4+ 4+ 4+/4+ 4+/4+ 4+/4+ 4+/4+  Hip adduction        Hip internal rotation        Hip external rotation        Knee flexion 5 5 5/5  5/5 5/5  Knee extension 5- 5- 5/5  5/5 5/5  Ankle dorsiflexion 4 5 4/5 4+/5            Shoulder flexion   5/5   5/5  Shoulder abduction   4+/5   5/5  Shoulder IR   5/5   5/5  Shoulder ER   4/4+   5/5   (Blank rows = not tested)  LUMBAR SPECIAL TESTS:  Eval: Straight leg raise test: Negative and Slump test: Negative Negative SIJ testing No pain with PAM testing to lumbar spine  FUNCTIONAL TESTS:  Eval: SLS X 10 seconds on left, 3-4 seconds on Rt 06/21/22: SLS 6 sec avg on Rt 11/02/22: SLS 13 sec avg on Rt  GAIT: Eval: Comments: independent community Ambulator, sometimes has pain with  this  TODAY'S TREATMENT:  11/02/22 -Nu step L7 X 6 min UE/LE seat #14 -Anti rotation press blue 2X 12 bilat -Suitcase/Farmers carry 30# 75 feet 3 laps each arm -Doorway pec strech 6  sec X 12 -Suitcase carry 30# 40 feet) X 3 each side. -Deadlift floor to waist 30# 2X12 with cues for form -Supermans, alternate and opposite shoulder/hip extension 2 X 12 -supine leg raises X 12 horizontal, X 12 vertical/scissor -Sidelying hip abd with red band around ankles X 12 bilat -plank position from seated performing hip/knee flexion and extension X 12 reps  10/19/22 -Nu step L7 X 6 min UE/LE seat #14 -Lumbar L stretch 5 sec X 12 -Anti rotation press blue 2X 12 bilat -Suitcase/Farmers carry 30# 75 feet 3 laps each arm -Doorway pec strech 6 sec X 12 -Suitcase carry 30# 40 feet) X 3 each side. -Deadlift floor to waist 30# 2X12 with cues for form -Row machine 55# 2X12 with cues for form -Lat pulldown machine 45# 2X12 -Diagonal chops 35# 2X12 bilat with cues for form -Supermans, alternate and opposite shoulder/hip extension 2 X 12 -Deadbugs X 12 bilat -supine leg raises X 12 horizontal, X 12 vertical/scissor    PATIENT EDUCATION: Education details: HEP, PT plan of care Person educated: Patient Education method: Explanation, Demonstration, Verbal cues, and Handouts Education comprehension: verbalized understanding and needs further education   HOME EXERCISE PROGRAM: Access Code: [email or web address] URL: [email or web address] Date: 07/05/2022 Prepared by: Ivery Quale  Exercises - Single Leg Bridge  - 2 x daily - 6 x weekly - 1-2 sets - 10 reps - 5 hold - Seated Hamstring Stretch  - 2 x daily - 6 x weekly - 1 sets - 2 reps - 30 hold - Standing ITB Stretch  - 2 x daily - 6 x weekly - 2-3 sets - 30 hold - Heel Toe Raises with Counter Support  - 2 x daily - 6 x weekly - 2-3 sets - 10 reps - Single Leg Stance  - 2 x daily - 6 x weekly - 1 sets - 5 reps - 10 sec hold - Side Stepping with  Resistance at Ankles  - 2 x daily - 6 x weekly - 2-3 sets - 10 reps - Sit to Stand Without Arm Support  - 2 x daily - 6 x weekly - 1-2 sets - 10 reps - Supine Active Straight Leg Raise  - 2 x daily - 6 x weekly - 1-2 sets - 12 reps - Sidelying Hip Abduction (Both Sides)  - 2 x daily - 6 x weekly - 1-2 sets - 12 reps - Prone Hip Extension  - 2 x daily - 6 x weekly - 1-2 sets - 12 reps - Bird Dog  - 2 x daily - 6 x weekly - 1 sets - 12 reps - 2 sec hold - Supine Piriformis Stretch with Foot on Ground  - 2 x daily - 6 x weekly - 2 sets - 30 hold - Tandem Walking  - 2 x daily - 6 x weekly - 3 sets - 12 reps - Shoulder External Rotation with Anchored Resistance  - 2 x daily - 6 x weekly - 2-3 sets - 12 reps - Shoulder External Rotation and Scapular Retraction with Resistance  - 2 x daily - 6 x weekly - 2-3 sets - 12 reps - Standing Shoulder Horizontal Abduction with Resistance  - 2 x daily - 6 x weekly - 2-3 sets - 10 reps - Standing Single Arm Shoulder Abduction with Resistance (Mirrored)  - 2 x daily - 6 x weekly - 2-3 sets - 12 reps - Heel Walking  - 2 x daily - 6 x weekly - 2-3 sets -  12 reps - Prone Alternating Arm and Leg Lifts  - 1 x daily - 3 x weekly - 1-2 sets - 12 reps - Prone Double Leg Lift  - 1 x daily - 3 x weekly - 1-2 sets - 12 reps - Quadruped Leg Lifts  - 1 x daily - 3 x weekly - 1-2 sets - 12 reps - Plank on Knees  - 1 x daily - 3 x weekly - 1 sets - 6 reps - 20-30 sec hold - Seated Thoracic Lumbar Extension with Pectoralis Stretch  - 1 x daily - 3 x weekly - 1 sets - 12 reps - 6 sec hold - Standing Thoracic Extension at Wall  - 1 x daily - 3 x weekly - 1 sets - 12 reps - Half Deadlift with Kettlebell  - 1 x daily - 3 x weekly - 3 sets - 10 reps - 6 sec hold - Standing Diagonal Chop  - 1 x daily - 3 x weekly - 2 sets - 12 reps - Anti-Rotation Press With Sidesteps and Anchored Resistance  - 1 x daily - 3 x weekly - 2 sets - 10 reps  ASSESSMENT:  CLINICAL IMPRESSION: Progress  note shows he has made good overall progress in his strength program as well as his single leg balance. He has met some of his PT goals but not yet met all of his PT goals so he will continue to benefit from skilled PT  OBJECTIVE IMPAIRMENTS: decreased activity tolerance, difficulty walking, decreased balance, decreased endurance, decreased mobility, decreased ROM, decreased strength, impaired flexibility, impaired LE use, postural dysfunction, and pain.  ACTIVITY LIMITATIONS: bending, lifting, carry, locomotion, cleaning, community activity,  PERSONAL FACTORS: childhood polio, OA are also affecting patient's functional outcome.  REHAB POTENTIAL: Good  CLINICAL DECISION MAKING: Stable/uncomplicated  EVALUATION COMPLEXITY: Low    GOALS: Short term PT Goals Target date: 03/07/2022   Pt will be I and compliant with HEP. Baseline:  Goal status: MET 03/07/22 Pt will decrease pain by 25% overall Baseline: Goal status: MET 03/07/22  Long term PT goals Target date:12/28/22   Pt will improve  hip/knee strength to at least 5-/5 MMT to improve functional strength Baseline: Goal status: ongoing, 4 to 4+ for Rt ankle DF and hip abd on avg 10/05/22 Pt will improve FOTO to at least 69% functional to show improved function Baseline: Goal status: DC this goal as foto was closed out. Pt will reduce pain to overall less than 2-3/10 with usual activity and kayaking Baseline: Goal status: ongoing 11/02/22,still limited by some back pain and Rt shoulder pain 4. Pt will be able to hold SLS X 10 seconds on avg for his Rt leg to show improved balance.  Baseline: 2-3 Goal status: MET 11/02/22, improved to 13+ seconds  PLAN: PT FREQUENCY: 1 times per 2 weeks  PT DURATION: 12 weeks  PLANNED INTERVENTIONS (unless contraindicated): aquatic PT, Canalith repositioning, cryotherapy, Electrical stimulation, Iontophoresis with 4 mg/ml dexamethasome, Moist heat, traction, Ultrasound, gait training, Therapeutic  exercise, balance training, neuromuscular re-education, patient/family education, prosthetic training, manual techniques, passive ROM, dry needling, taping, vasopnuematic device, vestibular, spinal manipulations, joint manipulations  PLAN FOR NEXT SESSION: lumbar/core strength, leg strength progressions as tolerated. Functional endurance.  April Manson, PT,DPT 11/02/2022, 1:52 PM

## 2022-11-03 DIAGNOSIS — F422 Mixed obsessional thoughts and acts: Secondary | ICD-10-CM | POA: Diagnosis not present

## 2022-11-03 DIAGNOSIS — F410 Panic disorder [episodic paroxysmal anxiety] without agoraphobia: Secondary | ICD-10-CM | POA: Diagnosis not present

## 2022-11-08 ENCOUNTER — Encounter (INDEPENDENT_AMBULATORY_CARE_PROVIDER_SITE_OTHER): Payer: Medicare Other | Admitting: Family Medicine

## 2022-11-08 DIAGNOSIS — F988 Other specified behavioral and emotional disorders with onset usually occurring in childhood and adolescence: Secondary | ICD-10-CM

## 2022-11-08 DIAGNOSIS — R9431 Abnormal electrocardiogram [ECG] [EKG]: Secondary | ICD-10-CM

## 2022-11-08 MED ORDER — AMPHETAMINE-DEXTROAMPHETAMINE 20 MG PO TABS: 20.0000 mg | ORAL_TABLET | Freq: Two times a day (BID) | ORAL | 0 refills | Status: DC

## 2022-11-12 DIAGNOSIS — R0789 Other chest pain: Secondary | ICD-10-CM | POA: Diagnosis not present

## 2022-11-12 DIAGNOSIS — I491 Atrial premature depolarization: Secondary | ICD-10-CM | POA: Diagnosis not present

## 2022-11-12 DIAGNOSIS — M542 Cervicalgia: Secondary | ICD-10-CM | POA: Diagnosis not present

## 2022-11-13 DIAGNOSIS — M542 Cervicalgia: Secondary | ICD-10-CM | POA: Diagnosis not present

## 2022-11-13 DIAGNOSIS — I491 Atrial premature depolarization: Secondary | ICD-10-CM | POA: Diagnosis not present

## 2022-11-13 DIAGNOSIS — R0789 Other chest pain: Secondary | ICD-10-CM | POA: Diagnosis not present

## 2022-11-14 ENCOUNTER — Encounter: Payer: Self-pay | Admitting: Cardiovascular Disease

## 2022-11-14 DIAGNOSIS — R9431 Abnormal electrocardiogram [ECG] [EKG]: Secondary | ICD-10-CM | POA: Diagnosis not present

## 2022-11-15 ENCOUNTER — Encounter: Payer: Medicare Other | Admitting: Physical Therapy

## 2022-11-15 NOTE — Telephone Encounter (Signed)

## 2022-11-16 NOTE — Telephone Encounter (Signed)
Called patient left Dr.Berry's advice on personal voice mail. 

## 2022-11-22 DIAGNOSIS — X32XXXD Exposure to sunlight, subsequent encounter: Secondary | ICD-10-CM | POA: Diagnosis not present

## 2022-11-22 DIAGNOSIS — L218 Other seborrheic dermatitis: Secondary | ICD-10-CM | POA: Diagnosis not present

## 2022-11-22 DIAGNOSIS — L821 Other seborrheic keratosis: Secondary | ICD-10-CM | POA: Diagnosis not present

## 2022-11-22 DIAGNOSIS — L570 Actinic keratosis: Secondary | ICD-10-CM | POA: Diagnosis not present

## 2022-11-22 DIAGNOSIS — D2272 Melanocytic nevi of left lower limb, including hip: Secondary | ICD-10-CM | POA: Diagnosis not present

## 2022-11-30 ENCOUNTER — Ambulatory Visit (INDEPENDENT_AMBULATORY_CARE_PROVIDER_SITE_OTHER): Payer: Medicare Other | Admitting: Physical Therapy

## 2022-11-30 ENCOUNTER — Encounter: Payer: Self-pay | Admitting: Physical Therapy

## 2022-11-30 DIAGNOSIS — M25511 Pain in right shoulder: Secondary | ICD-10-CM

## 2022-11-30 DIAGNOSIS — M25552 Pain in left hip: Secondary | ICD-10-CM | POA: Diagnosis not present

## 2022-11-30 DIAGNOSIS — M5459 Other low back pain: Secondary | ICD-10-CM | POA: Diagnosis not present

## 2022-11-30 DIAGNOSIS — M25561 Pain in right knee: Secondary | ICD-10-CM | POA: Diagnosis not present

## 2022-11-30 DIAGNOSIS — R262 Difficulty in walking, not elsewhere classified: Secondary | ICD-10-CM | POA: Diagnosis not present

## 2022-11-30 DIAGNOSIS — G8929 Other chronic pain: Secondary | ICD-10-CM
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
## 2022-12-08 DIAGNOSIS — F410 Panic disorder [episodic paroxysmal anxiety] without agoraphobia: Secondary | ICD-10-CM | POA: Diagnosis not present

## 2022-12-08 DIAGNOSIS — F422 Mixed obsessional thoughts and acts: Secondary | ICD-10-CM | POA: Diagnosis not present

## 2022-12-13 ENCOUNTER — Encounter: Payer: Self-pay | Admitting: Family Medicine

## 2022-12-13 ENCOUNTER — Encounter: Payer: Self-pay | Admitting: Physical Therapy

## 2022-12-13 ENCOUNTER — Ambulatory Visit (INDEPENDENT_AMBULATORY_CARE_PROVIDER_SITE_OTHER): Payer: Medicare Other | Admitting: Physical Therapy

## 2022-12-13 DIAGNOSIS — M25552 Pain in left hip: Secondary | ICD-10-CM | POA: Diagnosis not present

## 2022-12-13 DIAGNOSIS — M25561 Pain in right knee: Secondary | ICD-10-CM

## 2022-12-13 DIAGNOSIS — G8929 Other chronic pain: Secondary | ICD-10-CM

## 2022-12-13 DIAGNOSIS — M6281 Muscle weakness (generalized): Secondary | ICD-10-CM

## 2022-12-13 DIAGNOSIS — M25511 Pain in right shoulder: Secondary | ICD-10-CM

## 2022-12-13 DIAGNOSIS — R262 Difficulty in walking, not elsewhere classified: Secondary | ICD-10-CM | POA: Diagnosis not present

## 2022-12-13 DIAGNOSIS — M5459 Other low back pain: Secondary | ICD-10-CM

## 2022-12-13 NOTE — Therapy (Signed)
OUTPATIENT PHYSICAL THERAPY TREATMENT   Patient Name: Jonathan Garrison MRN: [id number] DOB:1945/02/17, 77 y.o., male Today's Date: 12/13/2022  END OF SESSION:  PT End of Session - 12/13/22 1507     Visit Number 18    Number of Visits 20    Date for PT Re-Evaluation 12/28/22    Authorization Type MCR    Progress Note Due on Visit 26    PT Start Time 1345    PT Stop Time 1430    PT Time Calculation (min) 45 min    Activity Tolerance Patient tolerated treatment well    Behavior During Therapy WFL for tasks assessed/performed                   Past Medical History:  Diagnosis Date   ADD (attention deficit disorder) without hyperactivity    Allergy    Anemia    no treatment   Anxiety    Arthralgia    many joints, managed with vitamins/herbs and ibuprofen   Arthritis    Cataract    Chest pain    Colitis 1986   single attack (believes was ulcerative)   Depression    Eczema    Esophageal stricture    GERD (gastroesophageal reflux disease)    Hemorrhoids    Lichen planus    Neuromuscular disorder (HCC)    POLIO AGE 85   Oral cancer (HCC) 05/2010   plans for surgery 06/2010   Osteoporosis    hips   Pleurisy 1984   Polio age 68   Umbilical hernia    Past Surgical History:  Procedure Laterality Date   COLONOSCOPY  2012   LEG SURGERY  when in 6th grade   L leg, bone spur removal (just above knee, laterally)   TONGUE SURGERY     for tongue cancer   UPPER GASTROINTESTINAL ENDOSCOPY     Patient Active Problem List   Diagnosis Date Noted   Hyperlipidemia 05/25/2022   Localized osteoarthritis of both shoulders 03/22/2022   Lower back pain 03/22/2022   Neck pain, chronic 03/22/2022   IT band syndrome 03/22/2022   Essential hypertension 02/22/2022   Family history of heart disease 02/22/2022   Trochanteric bursitis of left hip 08/01/2021   Pain of both hip joints 07/20/2021   Seasonal allergic rhinitis 05/24/2021   ETD (Eustachian tube dysfunction),  bilateral 05/24/2021   Gastroesophageal reflux disease without esophagitis 12/15/2017   Dysphagia 12/15/2017   History of oral cancer 12/15/2017   Increased prostate specific antigen (PSA) velocity 11/27/2017   Osteopenia 08/01/2017   Vitamin B12 deficiency anemia 06/09/2014   Chest pain 03/13/2013   Depression with anxiety 08/26/2011   Overweight (BMI 25.0-29.9) 08/26/2011   Joint pain 08/26/2011   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 Rt knee pain, Rt shoulder sprain  Rationale for Evaluation and Treatment: Rehabilitation  THERAPY DIAG:  Other low back pain  Pain in left hip  Chronic pain of right knee  Difficulty in walking, not elsewhere classified  Acute pain of right shoulder  Muscle weakness (generalized)  ONSET DATE: chronic pain for years  SUBJECTIVE:  SUBJECTIVE STATEMENT: He relays had overall more pain last week, may be due to weather, but feeling better today. He can notice he is getting stronger with going up/down stairs. He wants to work more on hamstring strength and shoulder strength today.  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 Yes: NPRS scale: 5/10 Pain location: back and hip Pain description: ache Aggravating factors: stairs, worse first thing in the morning Relieving factors: meloxicam, voltaren  PRECAUTIONS: None  WEIGHT BEARING RESTRICTIONS: No  FALLS:  Has patient fallen in last 6 months? No  OCCUPATION: retired  PLOF: Independent  PATIENT GOALS: reduce pain,   NEXT MD VISIT:   OBJECTIVE:   DIAGNOSTIC FINDINGS:  Xray (right knee 4  views, left knee 4 views): Right knee with very mild degenerative findings particularly about the medial joint space  PATIENT SURVEYS:  Eval: FOTO 67% functional   COGNITION: Overall cognitive status: Within functional limits for tasks assessed     SENSATION: WFL  MUSCLE LENGTH: Hamstrings: mildly tight at 80 deg bilat ITB tightness mildly on Rt  POSTURE:   PALPATION: Tender to palpation over Lt SIJ  LUMBAR ROM:   AROM eval  Flexion WNL  Extension WNL  Right lateral flexion WNL  Left lateral flexion WNL  Right rotation WNL  Left rotation WNL   (Blank rows = not tested)   ROM:     Active  Right eval Left eval Rt/Lt 04/28/22 Right/Left 10/05/22 Right 11/02/22  Hip flexion [facility]     Hip extension       Hip abduction [facility]     Hip adduction       Hip internal rotation       Hip external rotation       Knee flexion [facility]     Knee extension [facility]     Shoulder flexion   150 160 bilat WFL  Shoulder abduction   155  160 bilat WFL                 (Blank rows = not tested)   MMT:    MMT Right eval Left eval Rt/Lt 04/28/22 Rt/Lt 07/05/22 Rt/Lt 08/24/22 Rt/Lt 11/02/22  Hip flexion 4+ 4+ 5/5 5/5 5/5   Hip extension        Hip abduction 4+ 4+ 4+/4+ 4+/4+ 4+/4+ 4+/4+  Hip adduction        Hip internal rotation        Hip external rotation        Knee flexion 5 5 5/5  5/5 5/5  Knee extension 5- 5- 5/5  5/5 5/5  Ankle dorsiflexion 4 5 4/5 4+/5            Shoulder flexion   5/5   5/5  Shoulder abduction   4+/5   5/5  Shoulder IR   5/5   5/5  Shoulder ER   4/4+   5/5   (Blank rows = not tested)  LUMBAR SPECIAL TESTS:  Eval: Straight leg raise test: Negative and Slump test: Negative Negative SIJ testing No pain with PAM testing to lumbar spine  FUNCTIONAL TESTS:  Eval: SLS X 10 seconds on left, 3-4 seconds on Rt 06/21/22: SLS 6 sec avg on Rt 11/02/22: SLS 13 sec avg on Rt  GAIT: Eval: Comments: independent community Ambulator, sometimes has pain with  this  TODAY'S TREATMENT:  12/13/22 -Nu step L6 X 8 min UE/LE seat #13 -Hamstring curl  machine 35# X12, 45# X12, 35# X12 -Seated row machine 45# 3X12 -Seated lat pulldown 45# 2X12 -Seated chest press 35# 2X12 -leg press DL 161# 0R60, then SL 45# 2X12 each side -Anti rotation press blue 2X 12 bilat -Suitcase/Farmers carry 30# 75 feet 3 laps each arm -Deadlift floor to waist 30# 2X12 with cues for form -Shoulder abduction 8# X 12 bilat -Shoulder flexion 8# X 12 bilat  11/30/22 -Nu step L6 X 6 min UE/LE seat #14 -leg press DL 409# 8J19, then SL 14# 2X12 each side -Alternating unilat rows with trunk rotation 25# each cable 3X12 -Anti rotation press blue 2X 12 bilat -Suitcase/Farmers carry 30# 75 feet 3 laps each arm -Deadlift floor to waist 30# 2X12 with cues for form -Supermans, alternate and opposite shoulder/hip extension 2 X 12 -supine leg raises X 12 horizontal, X 12 vertical/scissor -Sidelying hip abd with red band around ankles X 12 bilat  11/02/22 -Nu step L7 X 6 min UE/LE seat #14 -Anti rotation press blue 2X 12 bilat -Suitcase/Farmers carry 30# 75 feet 3 laps each arm -Doorway pec strech 6 sec X 12 -Deadlift floor to waist 30# 2X12 with cues for form -Supermans, alternate and opposite shoulder/hip extension 2 X 12 -supine leg raises X 12 horizontal, X 12 vertical/scissor -Sidelying hip abd with red band around ankles X 12 bilat -plank position from seated performing hip/knee flexion and extension X 12 reps  10/19/22 -Nu step L7 X 6 min UE/LE seat #14 -Lumbar L stretch 5 sec X 12 -Anti rotation press blue 2X 12 bilat -Suitcase/Farmers carry 30# 75 feet 3 laps each arm -Doorway pec strech 6 sec X 12 -Suitcase carry 30# 40 feet) X 3 each side. -Deadlift floor to waist 30# 2X12 with cues for form -Row machine 55# 2X12 with cues for form -Lat pulldown machine 45# 2X12 -Diagonal chops 35# 2X12 bilat with cues for form -Supermans, alternate and opposite shoulder/hip  extension 2 X 12 -Deadbugs X 12 bilat -supine leg raises X 12 horizontal, X 12 vertical/scissor    PATIENT EDUCATION: Education details: HEP, PT plan of care Person educated: Patient Education method: Explanation, Demonstration, Verbal cues, and Handouts Education comprehension: verbalized understanding and needs further education   HOME EXERCISE PROGRAM: Access Code: [email or web address] URL: [email or web address] Date: 07/05/2022 Prepared by: Ivery Quale  Exercises - Single Leg Bridge  - 2 x daily - 6 x weekly - 1-2 sets - 10 reps - 5 hold - Seated Hamstring Stretch  - 2 x daily - 6 x weekly - 1 sets - 2 reps - 30 hold - Standing ITB Stretch  - 2 x daily - 6 x weekly - 2-3 sets - 30 hold - Heel Toe Raises with Counter Support  - 2 x daily - 6 x weekly - 2-3 sets - 10 reps - Single Leg Stance  - 2 x daily - 6 x weekly - 1 sets - 5 reps - 10 sec hold - Side Stepping with Resistance at Ankles  - 2 x daily - 6 x weekly - 2-3 sets - 10 reps - Sit to Stand Without Arm Support  - 2 x daily - 6 x weekly - 1-2 sets - 10 reps - Supine Active Straight Leg Raise  - 2 x daily - 6 x weekly - 1-2 sets - 12 reps - Sidelying Hip Abduction (Both Sides)  - 2 x daily - 6 x weekly - 1-2 sets - 12 reps - Prone Hip Extension  - 2 x daily -  6 x weekly - 1-2 sets - 12 reps - Bird Dog  - 2 x daily - 6 x weekly - 1 sets - 12 reps - 2 sec hold - Supine Piriformis Stretch with Foot on Ground  - 2 x daily - 6 x weekly - 2 sets - 30 hold - Tandem Walking  - 2 x daily - 6 x weekly - 3 sets - 12 reps - Shoulder External Rotation with Anchored Resistance  - 2 x daily - 6 x weekly - 2-3 sets - 12 reps - Shoulder External Rotation and Scapular Retraction with Resistance  - 2 x daily - 6 x weekly - 2-3 sets - 12 reps - Standing Shoulder Horizontal Abduction with Resistance  - 2 x daily - 6 x weekly - 2-3 sets - 10 reps - Standing Single Arm Shoulder Abduction with Resistance (Mirrored)  - 2 x daily - 6 x weekly  - 2-3 sets - 12 reps - Heel Walking  - 2 x daily - 6 x weekly - 2-3 sets - 12 reps - Prone Alternating Arm and Leg Lifts  - 1 x daily - 3 x weekly - 1-2 sets - 12 reps - Prone Double Leg Lift  - 1 x daily - 3 x weekly - 1-2 sets - 12 reps - Quadruped Leg Lifts  - 1 x daily - 3 x weekly - 1-2 sets - 12 reps - Plank on Knees  - 1 x daily - 3 x weekly - 1 sets - 6 reps - 20-30 sec hold - Seated Thoracic Lumbar Extension with Pectoralis Stretch  - 1 x daily - 3 x weekly - 1 sets - 12 reps - 6 sec hold - Standing Thoracic Extension at Wall  - 1 x daily - 3 x weekly - 1 sets - 12 reps - Half Deadlift with Kettlebell  - 1 x daily - 3 x weekly - 3 sets - 10 reps - 6 sec hold - Standing Diagonal Chop  - 1 x daily - 3 x weekly - 2 sets - 12 reps - Anti-Rotation Press With Sidesteps and Anchored Resistance  - 1 x daily - 3 x weekly - 2 sets - 10 reps  ASSESSMENT:  CLINICAL IMPRESSION: We worked more on hamstring strength and shoulder strength at his request and showed him ways to work on this at gym as well. Overall is improving with his functional strength and will continue to benefit from PT to work toward his functional goals.   OBJECTIVE IMPAIRMENTS: decreased activity tolerance, difficulty walking, decreased balance, decreased endurance, decreased mobility, decreased ROM, decreased strength, impaired flexibility, impaired LE use, postural dysfunction, and pain.  ACTIVITY LIMITATIONS: bending, lifting, carry, locomotion, cleaning, community activity,  PERSONAL FACTORS: childhood polio, OA are also affecting patient's functional outcome.  REHAB POTENTIAL: Good  CLINICAL DECISION MAKING: Stable/uncomplicated  EVALUATION COMPLEXITY: Low    GOALS: Short term PT Goals Target date: 03/07/2022   Pt will be I and compliant with HEP. Baseline:  Goal status: MET 03/07/22 Pt will decrease pain by 25% overall Baseline: Goal status: MET 03/07/22  Long term PT goals Target date:12/28/22   Pt will  improve  hip/knee strength to at least 5-/5 MMT to improve functional strength Baseline: Goal status: ongoing, 4 to 4+ for Rt ankle DF and hip abd on avg 10/05/22 Pt will improve FOTO to at least 69% functional to show improved function Baseline: Goal status: DC this goal as foto was closed out. Pt will  reduce pain to overall less than 2-3/10 with usual activity and kayaking Baseline: Goal status: ongoing 11/02/22,still limited by some back pain and Rt shoulder pain 4. Pt will be able to hold SLS X 10 seconds on avg for his Rt leg to show improved balance.  Baseline: 2-3 Goal status: MET 11/02/22, improved to 13+ seconds  PLAN: PT FREQUENCY: 1 times per 2 weeks  PT DURATION: 12 weeks  PLANNED INTERVENTIONS (unless contraindicated): aquatic PT, Canalith repositioning, cryotherapy, Electrical stimulation, Iontophoresis with 4 mg/ml dexamethasome, Moist heat, traction, Ultrasound, gait training, Therapeutic exercise, balance training, neuromuscular re-education, patient/family education, prosthetic training, manual techniques, passive ROM, dry needling, taping, vasopnuematic device, vestibular, spinal manipulations, joint manipulations  PLAN FOR NEXT SESSION: lumbar/core strength, leg strength progressions as tolerated. Functional endurance.   April Manson, PT,DPT 12/13/2022, 3:07 PM

## 2022-12-14 ENCOUNTER — Ambulatory Visit: Payer: Medicare Other | Attending: Cardiovascular Disease | Admitting: Cardiovascular Disease

## 2022-12-14 ENCOUNTER — Encounter: Payer: Self-pay | Admitting: Cardiovascular Disease

## 2022-12-14 VITALS — BP 120/74 | HR 88 | Ht 74.0 in | Wt 196.4 lb

## 2022-12-14 DIAGNOSIS — I10 Essential (primary) hypertension: Secondary | ICD-10-CM

## 2022-12-14 DIAGNOSIS — R0789 Other chest pain: Secondary | ICD-10-CM | POA: Diagnosis not present

## 2022-12-14 DIAGNOSIS — Z8249 Family history of ischemic heart disease and other diseases of the circulatory system: Secondary | ICD-10-CM

## 2022-12-14 DIAGNOSIS — I2510 Atherosclerotic heart disease of native coronary artery without angina pectoris: Secondary | ICD-10-CM | POA: Diagnosis not present

## 2022-12-14 DIAGNOSIS — E785 Hyperlipidemia, unspecified: Secondary | ICD-10-CM
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
## 2022-12-14 NOTE — Assessment & Plan Note (Signed)
History of hyperlipidemia intolerant to atorvastatin but tolerating rosuvastatin with lipid profile performed 03/23/2022 revealing total cholesterol 149, LDL 62 and HDL 79, acceptable for secondary prevention.

## 2022-12-14 NOTE — Assessment & Plan Note (Signed)
History of atypical chest pain in the past with coronary calcium score performed 03/10/2022 which is 113 all in the LAD territory.  Subsequent Myoview stress test performed 04/09/2022 revealed 2 mm of ST segment depression with exercise but perfusion was normal.  He is very active and is asymptomatic.

## 2022-12-14 NOTE — Patient Instructions (Signed)
Medication Instructions:  Your physician recommends that you continue on your current medications as directed. Please refer to the Current Medication list given to you today.  *If you need a refill on your cardiac medications before your next appointment, please call your pharmacy*   Testing/Procedures: Your physician has requested that you have an echocardiogram. Echocardiography is a painless test that uses sound waves to create images of your heart. It provides your doctor with information about the size and shape of your heart and how well your heart's chambers and valves are working. This procedure takes approximately one hour. There are no restrictions for this procedure. Please do NOT wear cologne, perfume, aftershave, or lotions (deodorant is allowed). Please arrive 15 minutes prior to your appointment time. This will take place at [address] [place]    Follow-Up: At [facility], you and your health needs are our priority.  As part of our continuing mission to provide you with exceptional heart care, we have created designated Provider Care Teams.  These Care Teams include your primary Cardiologist (physician) and Advanced Practice Providers (APPs -  Physician Assistants and Nurse Practitioners) who all work together to provide you with the care you need, when you need it.  We recommend signing up for the patient portal called "MyChart".  Sign up information is provided on this After Visit Summary.  MyChart is used to connect with patients for Virtual Visits (Telemedicine).  Patients are able to view lab/test results, encounter notes, upcoming appointments, etc.  Non-urgent messages can be sent to your provider as well.   To learn more about what you can do with MyChart, go to [email or web address].    Your next appointment:   12 month(s)  Provider:   Nanetta Batty, MD

## 2022-12-14 NOTE — Assessment & Plan Note (Signed)
History of essential hypertension her blood pressure measured today at 120/74.  He is on losartan.Marland Kitchen

## 2022-12-19 DIAGNOSIS — Z23 Encounter for immunization: Secondary | ICD-10-CM | POA: Diagnosis not present

## 2022-12-28 ENCOUNTER — Ambulatory Visit: Payer: Medicare Other | Admitting: Physical Therapy

## 2022-12-28 ENCOUNTER — Encounter: Payer: Self-pay | Admitting: Physical Therapy

## 2022-12-28 DIAGNOSIS — M5459 Other low back pain: Secondary | ICD-10-CM

## 2022-12-28 DIAGNOSIS — M6281 Muscle weakness (generalized): Secondary | ICD-10-CM | POA: Diagnosis not present

## 2022-12-28 DIAGNOSIS — M25511 Pain in right shoulder: Secondary | ICD-10-CM

## 2022-12-28 DIAGNOSIS — G8929 Other chronic pain: Secondary | ICD-10-CM | POA: Diagnosis not present

## 2022-12-28 DIAGNOSIS — R262 Difficulty in walking, not elsewhere classified: Secondary | ICD-10-CM

## 2022-12-28 DIAGNOSIS — M25552 Pain in left hip: Secondary | ICD-10-CM | POA: Diagnosis not present

## 2022-12-28 DIAGNOSIS — M25561 Pain in right knee: Secondary | ICD-10-CM

## 2022-12-28 NOTE — Therapy (Signed)
OUTPATIENT PHYSICAL THERAPY TREATMENT   Patient Name: Dmoni Causby MRN: [id number] DOB:Oct 01, 1945, 77 y.o., male Today's Date: 12/28/2022  END OF SESSION:  PT End of Session - 12/28/22 1351     Visit Number 19    Number of Visits 20    Date for PT Re-Evaluation 12/28/22    Authorization Type MCR    Progress Note Due on Visit 26    PT Start Time 1345    PT Stop Time 1430    PT Time Calculation (min) 45 min    Activity Tolerance Patient tolerated treatment well    Behavior During Therapy WFL for tasks assessed/performed                   Past Medical History:  Diagnosis Date   ADD (attention deficit disorder) without hyperactivity    Allergy    Anemia    no treatment   Anxiety    Arthralgia    many joints, managed with vitamins/herbs and ibuprofen   Arthritis    Cataract    Chest pain    Colitis 1986   single attack (believes was ulcerative)   Depression    Eczema    Esophageal stricture    GERD (gastroesophageal reflux disease)    Hemorrhoids    Lichen planus    Neuromuscular disorder (HCC)    POLIO AGE 61   Oral cancer (HCC) 05/2010   plans for surgery 06/2010   Osteoporosis    hips   Pleurisy 1984   Polio age 63   Umbilical hernia    Past Surgical History:  Procedure Laterality Date   COLONOSCOPY  2012   LEG SURGERY  when in 6th grade   L leg, bone spur removal (just above knee, laterally)   TONGUE SURGERY     for tongue cancer   UPPER GASTROINTESTINAL ENDOSCOPY     Patient Active Problem List   Diagnosis Date Noted   Hyperlipidemia 05/25/2022   Localized osteoarthritis of both shoulders 03/22/2022   Lower back pain 03/22/2022   Neck pain, chronic 03/22/2022   IT band syndrome 03/22/2022   Essential hypertension 02/22/2022   Family history of heart disease 02/22/2022   Trochanteric bursitis of left hip 08/01/2021   Pain of both hip joints 07/20/2021   Seasonal allergic rhinitis 05/24/2021   ETD (Eustachian tube dysfunction), bilateral  05/24/2021   Gastroesophageal reflux disease without esophagitis 12/15/2017   Dysphagia 12/15/2017   History of oral cancer 12/15/2017   Increased prostate specific antigen (PSA) velocity 11/27/2017   Osteopenia 08/01/2017   Vitamin B12 deficiency anemia 06/09/2014   Chest pain 03/13/2013   Depression with anxiety 08/26/2011   Overweight (BMI 25.0-29.9) 08/26/2011   Joint pain 08/26/2011   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 Rt knee pain, Rt shoulder sprain  Rationale for Evaluation and Treatment: Rehabilitation  THERAPY DIAG:  Other low back pain  Pain in left hip  Chronic pain of right knee  Difficulty in walking, not elsewhere classified  Acute pain of right shoulder  Muscle weakness (generalized)  ONSET DATE: chronic pain for years  SUBJECTIVE:  SUBJECTIVE STATEMENT: He relays about 6/10 pain overall. He does complain of some intermittent pain due to hammer toes that affects his walking and wants to know any exercises or treatment he can do for this   PERTINENT HISTORY:  Polio as child, OA  PAIN:  Are you having pain? Yes: NPRS scale: 5/10 Pain location: back and hip, knee Pain description: ache Aggravating factors: stairs, worse first thing in the morning Relieving factors: meloxicam, voltaren  PRECAUTIONS: None  WEIGHT BEARING RESTRICTIONS: No  FALLS:  Has patient fallen in last 6 months? No  OCCUPATION: retired  PLOF: Independent  PATIENT GOALS: reduce pain,   NEXT MD VISIT:   OBJECTIVE:   DIAGNOSTIC FINDINGS:  Xray (right knee 4 views, left knee 4 views): Right knee with very  mild degenerative findings particularly about the medial joint space  PATIENT SURVEYS:  Eval: FOTO 67% functional   COGNITION: Overall cognitive status: Within functional limits for tasks assessed     SENSATION: WFL  MUSCLE LENGTH: Hamstrings: mildly tight at 80 deg bilat ITB tightness mildly on Rt  POSTURE:   PALPATION: Tender to palpation over Lt SIJ  LUMBAR ROM:   AROM eval  Flexion WNL  Extension WNL  Right lateral flexion WNL  Left lateral flexion WNL  Right rotation WNL  Left rotation WNL   (Blank rows = not tested)   ROM:     Active  Right eval Left eval Rt/Lt 04/28/22 Right/Left 10/05/22 Right 11/02/22  Hip flexion [facility]     Hip extension       Hip abduction [facility]     Hip adduction       Hip internal rotation       Hip external rotation       Knee flexion [facility]     Knee extension [facility]     Shoulder flexion   150 160 bilat WFL  Shoulder abduction   155  160 bilat WFL                 (Blank rows = not tested)   MMT:    MMT Right eval Left eval Rt/Lt 04/28/22 Rt/Lt 07/05/22 Rt/Lt 08/24/22 Rt/Lt 11/02/22  Hip flexion 4+ 4+ 5/5 5/5 5/5   Hip extension        Hip abduction 4+ 4+ 4+/4+ 4+/4+ 4+/4+ 4+/4+  Hip adduction        Hip internal rotation        Hip external rotation        Knee flexion 5 5 5/5  5/5 5/5  Knee extension 5- 5- 5/5  5/5 5/5  Ankle dorsiflexion 4 5 4/5 4+/5            Shoulder flexion   5/5   5/5  Shoulder abduction   4+/5   5/5  Shoulder IR   5/5   5/5  Shoulder ER   4/4+   5/5   (Blank rows = not tested)  LUMBAR SPECIAL TESTS:  Eval: Straight leg raise test: Negative and Slump test: Negative Negative SIJ testing No pain with PAM testing to lumbar spine  FUNCTIONAL TESTS:  Eval: SLS X 10 seconds on left, 3-4 seconds on Rt 06/21/22: SLS 6 sec avg on Rt 11/02/22: SLS 13 sec avg on Rt  GAIT: Eval: Comments: independent community Ambulator, 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  TODAY'S TREATMENT:  12/28/22 -Nu step  L6 X 6 min UE/LE seat #13 -toe curls and toe extensions X  10 each for hammer toes -Marble pick up X 10 for hammer toes -KT tape Rt toe extensors with education on how to apply at home -Hamstring curl machine 35# 3X12, -Seated row machine 45# 3X12 -Seated lat pulldown 45# 2X12 -Seated chest press 35# 2X12 -Anti rotation press blue 2X 12 bilat -Suitcase/Farmers carry 30# 75 feet 3 laps each arm -Deadlift floor to waist 30# 2X12 with cues for form  12/13/22 -Nu step L6 X 8 min UE/LE seat #13 -Hamstring curl machine 35# X12, 45# X12, 35# X12 -Seated row machine 45# 3X12 -Seated lat pulldown 45# 2X12 -Seated chest press 35# 2X12 -leg press DL 161# [id number], then SL 45# 2X12 each side -Anti rotation press blue 2X 12 bilat -Suitcase/Farmers carry 30# 75 feet 3 laps each arm -Deadlift floor to waist 30# 2X12 with cues for form -Shoulder abduction 8# X 12 bilat -Shoulder flexion 8# X 12 bilat  11/30/22 -Nu step L6 X 6 min UE/LE seat #14 -leg press DL 409# [id number], then SL 14# 2X12 each side -Alternating unilat rows with trunk rotation 25# each cable 3X12 -Anti rotation press blue 2X 12 bilat -Suitcase/Farmers carry 30# 75 feet 3 laps each arm -Deadlift floor to waist 30# 2X12 with cues for form -Supermans, alternate and opposite shoulder/hip extension 2 X 12 -supine leg raises X 12 horizontal, X 12 vertical/scissor -Sidelying hip abd with red band around ankles X 12 bilat  11/02/22 -Nu step L7 X 6 min UE/LE seat #14 -Anti rotation press blue 2X 12 bilat -Suitcase/Farmers carry 30# 75 feet 3 laps each arm -Doorway pec strech 6 sec X 12 -Deadlift floor to waist 30# 2X12 with cues for form -Supermans, alternate and opposite shoulder/hip extension 2 X 12 -supine leg raises X 12 horizontal, X 12 vertical/scissor -Sidelying hip abd with red band around ankles X 12 bilat -plank position from seated performing hip/knee flexion and extension X 12 reps     PATIENT EDUCATION: Education details:  HEP, PT plan of care Person educated: Patient Education method: Explanation, Demonstration, Verbal cues, and Handouts Education comprehension: verbalized understanding and needs further education   HOME EXERCISE PROGRAM: Access Code: [email or web address] URL: [email or web address] Date: 07/05/2022 Prepared by: Ivery Quale  Exercises - Single Leg Bridge  - 2 x daily - 6 x weekly - 1-2 sets - 10 reps - 5 hold - Seated Hamstring Stretch  - 2 x daily - 6 x weekly - 1 sets - 2 reps - 30 hold - Standing ITB Stretch  - 2 x daily - 6 x weekly - 2-3 sets - 30 hold - Heel Toe Raises with Counter Support  - 2 x daily - 6 x weekly - 2-3 sets - 10 reps - Single Leg Stance  - 2 x daily - 6 x weekly - 1 sets - 5 reps - 10 sec hold - Side Stepping with Resistance at Ankles  - 2 x daily - 6 x weekly - 2-3 sets - 10 reps - Sit to Stand Without Arm Support  - 2 x daily - 6 x weekly - 1-2 sets - 10 reps - Supine Active Straight Leg Raise  - 2 x daily - 6 x weekly - 1-2 sets - 12 reps - Sidelying Hip Abduction (Both Sides)  - 2 x daily - 6 x weekly - 1-2 sets - 12 reps - Prone Hip Extension  - 2 x daily - 6 x weekly - 1-2 sets - 12 reps - Bird Dog  - 2 x daily -  6 x weekly - 1 sets - 12 reps - 2 sec hold - Supine Piriformis Stretch with Foot on Ground  - 2 x daily - 6 x weekly - 2 sets - 30 hold - Tandem Walking  - 2 x daily - 6 x weekly - 3 sets - 12 reps - Shoulder External Rotation with Anchored Resistance  - 2 x daily - 6 x weekly - 2-3 sets - 12 reps - Shoulder External Rotation and Scapular Retraction with Resistance  - 2 x daily - 6 x weekly - 2-3 sets - 12 reps - Standing Shoulder Horizontal Abduction with Resistance  - 2 x daily - 6 x weekly - 2-3 sets - 10 reps - Standing Single Arm Shoulder Abduction with Resistance (Mirrored)  - 2 x daily - 6 x weekly - 2-3 sets - 12 reps - Heel Walking  - 2 x daily - 6 x weekly - 2-3 sets - 12 reps - Prone Alternating Arm and Leg Lifts  - 1 x daily - 3 x  weekly - 1-2 sets - 12 reps - Prone Double Leg Lift  - 1 x daily - 3 x weekly - 1-2 sets - 12 reps - Quadruped Leg Lifts  - 1 x daily - 3 x weekly - 1-2 sets - 12 reps - Plank on Knees  - 1 x daily - 3 x weekly - 1 sets - 6 reps - 20-30 sec hold - Seated Thoracic Lumbar Extension with Pectoralis Stretch  - 1 x daily - 3 x weekly - 1 sets - 12 reps - 6 sec hold - Standing Thoracic Extension at Wall  - 1 x daily - 3 x weekly - 1 sets - 12 reps - Half Deadlift with Kettlebell  - 1 x daily - 3 x weekly - 3 sets - 10 reps - 6 sec hold - Standing Diagonal Chop  - 1 x daily - 3 x weekly - 2 sets - 12 reps - Anti-Rotation Press With Sidesteps and Anchored Resistance  - 1 x daily - 3 x weekly - 2 sets - 10 reps  ASSESSMENT:  CLINICAL IMPRESSION: We worked on overall leg and back strength as well as posture and I showed him some self care exercises for hammer toes as well as KT tape technique for this. He has one more visit left so we will update measurements and goal in anticipation for discharge to independent program.   OBJECTIVE IMPAIRMENTS: decreased activity tolerance, difficulty walking, decreased balance, decreased endurance, decreased mobility, decreased ROM, decreased strength, impaired flexibility, impaired LE use, postural dysfunction, and pain.  ACTIVITY LIMITATIONS: bending, lifting, carry, locomotion, cleaning, community activity,  PERSONAL FACTORS: childhood polio, OA are also affecting patient's functional outcome.  REHAB POTENTIAL: Good  CLINICAL DECISION MAKING: Stable/uncomplicated  EVALUATION COMPLEXITY: Low    GOALS: Short term PT Goals Target date: 03/07/2022   Pt will be I and compliant with HEP. Baseline:  Goal status: MET 03/07/22 Pt will decrease pain by 25% overall Baseline: Goal status: MET 03/07/22  Long term PT goals Target date:12/28/22   Pt will improve  hip/knee strength to at least 5-/5 MMT to improve functional strength Baseline: Goal status: ongoing,  4 to 4+ for Rt ankle DF and hip abd on avg 10/05/22 Pt will improve FOTO to at least 69% functional to show improved function Baseline: Goal status: DC this goal as foto was closed out. Pt will reduce pain to overall less than 2-3/10 with usual activity and  kayaking Baseline: Goal status: ongoing 11/02/22,still limited by some back pain and Rt shoulder pain 4. Pt will be able to hold SLS X 10 seconds on avg for his Rt leg to show improved balance.  Baseline: 2-3 Goal status: MET 11/02/22, improved to 13+ seconds  PLAN: PT FREQUENCY: 1 times per 2 weeks  PT DURATION: 12 weeks  PLANNED INTERVENTIONS (unless contraindicated): aquatic PT, Canalith repositioning, cryotherapy, Electrical stimulation, Iontophoresis with 4 mg/ml dexamethasome, Moist heat, traction, Ultrasound, gait training, Therapeutic exercise, balance training, neuromuscular re-education, patient/family education, prosthetic training, manual techniques, passive ROM, dry needling, taping, vasopnuematic device, vestibular, spinal manipulations, joint manipulations  PLAN FOR NEXT SESSION: update measurements and plan do DC.  April Manson, PT,DPT 12/28/2022, 1:52 PM

## 2023-01-06 ENCOUNTER — Encounter: Payer: Self-pay | Admitting: Cardiovascular Disease

## 2023-01-06 ENCOUNTER — Encounter: Payer: Self-pay | Admitting: Family Medicine

## 2023-01-06 NOTE — Telephone Encounter (Signed)
Response from card to the pt was:  "Hello Jonathan Garrison Your Echo has been scheduled. If you want to know more about your insurance covering this procedure you can call your insurance company and billing. Have a great day. "

## 2023-01-10 ENCOUNTER — Encounter: Payer: Self-pay | Admitting: Family Medicine

## 2023-01-10 DIAGNOSIS — E611 Iron deficiency: Secondary | ICD-10-CM

## 2023-01-10 MED ORDER — FERROUS SULFATE 324 (65 FE) MG PO TBEC: DELAYED_RELEASE_TABLET | ORAL | 3 refills | Status: DC

## 2023-01-11 ENCOUNTER — Encounter: Payer: Medicare Other | Admitting: Physical Therapy

## 2023-01-11 DIAGNOSIS — K121 Other forms of stomatitis: Secondary | ICD-10-CM | POA: Diagnosis not present

## 2023-01-11 DIAGNOSIS — K1329 Other disturbances of oral epithelium, including tongue: Secondary | ICD-10-CM | POA: Diagnosis not present

## 2023-01-11 NOTE — Telephone Encounter (Signed)
Pt has Medicare so he wouldn't get a "Physical" right?

## 2023-01-20 ENCOUNTER — Encounter: Payer: Self-pay | Admitting: Family Medicine

## 2023-01-20 ENCOUNTER — Ambulatory Visit (HOSPITAL_COMMUNITY): Payer: Medicare Other | Attending: Cardiovascular Disease

## 2023-01-20 DIAGNOSIS — I10 Essential (primary) hypertension: Secondary | ICD-10-CM | POA: Diagnosis not present

## 2023-01-20 DIAGNOSIS — I2510 Atherosclerotic heart disease of native coronary artery without angina pectoris: Secondary | ICD-10-CM | POA: Insufficient documentation

## 2023-01-20 DIAGNOSIS — R0789 Other chest pain: Secondary | ICD-10-CM | POA: Insufficient documentation

## 2023-01-20 DIAGNOSIS — E785 Hyperlipidemia, unspecified: Secondary | ICD-10-CM | POA: Diagnosis not present

## 2023-01-20 DIAGNOSIS — Z8249 Family history of ischemic heart disease and other diseases of the circulatory system: Secondary | ICD-10-CM | POA: Diagnosis not present

## 2023-01-20 LAB — ECHOCARDIOGRAM COMPLETE
Area-P 1/2: 3.42 cm2
MV M vel: 5.99 m/s
MV Peak grad: 143.5 mm[Hg]
S' Lateral: 2.7 cm

## 2023-01-21 NOTE — Telephone Encounter (Signed)

## 2023-01-24 ENCOUNTER — Ambulatory Visit (INDEPENDENT_AMBULATORY_CARE_PROVIDER_SITE_OTHER): Payer: Medicare Other | Admitting: Physical Therapy

## 2023-01-24 ENCOUNTER — Encounter: Payer: Self-pay | Admitting: Physical Therapy

## 2023-01-24 ENCOUNTER — Telehealth: Payer: Self-pay | Admitting: *Deleted

## 2023-01-24 DIAGNOSIS — R262 Difficulty in walking, not elsewhere classified: Secondary | ICD-10-CM | POA: Diagnosis not present

## 2023-01-24 DIAGNOSIS — M6281 Muscle weakness (generalized): Secondary | ICD-10-CM

## 2023-01-24 DIAGNOSIS — M25552 Pain in left hip: Secondary | ICD-10-CM | POA: Diagnosis not present

## 2023-01-24 DIAGNOSIS — M5459 Other low back pain: Secondary | ICD-10-CM | POA: Diagnosis not present

## 2023-01-24 DIAGNOSIS — G8929 Other chronic pain: Secondary | ICD-10-CM

## 2023-01-24 DIAGNOSIS — M25511 Pain in right shoulder: Secondary | ICD-10-CM | POA: Diagnosis not present

## 2023-01-24 DIAGNOSIS — M25561 Pain in right knee: Secondary | ICD-10-CM | POA: Diagnosis not present

## 2023-01-24 NOTE — Telephone Encounter (Signed)
-----   Message from Nanetta Batty sent at 01/21/2023 10:06 AM EST ----- Vigorous LV FXN. G1DD. Mod TR, mild MR. Repeat when clinically indicated

## 2023-01-24 NOTE — Telephone Encounter (Signed)
 Left  detailed message of  echo results on secure voicemail per DPR. Any question may call office back. Continue with current medications.

## 2023-01-24 NOTE — Therapy (Signed)
 OUTPATIENT PHYSICAL THERAPY TREATMENT/Discharge PHYSICAL THERAPY DISCHARGE SUMMARY  Visits from Start of Care: 20  Current functional level related to goals / functional outcomes: See below   Remaining deficits: See below   Education / Equipment: HEP  Plan:  Patient goals were met thus feels ready to discharge      Patient Name: Jonathan Garrison MRN: [id number] DOB:[birth year], 77 y.o., male Today's Date: 01/24/2023  END OF SESSION:  PT End of Session - 01/24/23 1435     Visit Number 20    Number of Visits 20    Date for PT Re-Evaluation 12/28/22    Authorization Type MCR    Progress Note Due on Visit 26    PT Start Time 1345    PT Stop Time 1427    PT Time Calculation (min) 42 min    Activity Tolerance Patient tolerated treatment well    Behavior During Therapy WFL for tasks assessed/performed                   Past Medical History:  Diagnosis Date   ADD (attention deficit disorder) without hyperactivity    Allergy    Anemia    no treatment   Anxiety    Arthralgia    many joints, managed with vitamins/herbs and ibuprofen   Arthritis    Cataract    Chest pain    Colitis 1986   single attack (believes was ulcerative)   Depression    Eczema    Esophageal stricture    GERD (gastroesophageal reflux disease)    Hemorrhoids    Lichen planus    Neuromuscular disorder (HCC)    POLIO AGE 45   Oral cancer (HCC) 05/2010   plans for surgery 06/2010   Osteoporosis    hips   Pleurisy 1984   Polio age 8   Umbilical hernia    Past Surgical History:  Procedure Laterality Date   COLONOSCOPY  2012   LEG SURGERY  when in 6th grade   L leg, bone spur removal (just above knee, laterally)   TONGUE SURGERY     for tongue cancer   UPPER GASTROINTESTINAL ENDOSCOPY     Patient Active Problem List   Diagnosis Date Noted   Hyperlipidemia 05/25/2022   Localized osteoarthritis of both shoulders 03/22/2022   Lower back pain 03/22/2022   Neck pain, chronic  03/22/2022   IT band syndrome 03/22/2022   Essential hypertension 02/22/2022   Family history of heart disease 02/22/2022   Trochanteric bursitis of left hip 08/01/2021   Pain of both hip joints 07/20/2021   Seasonal allergic rhinitis 05/24/2021   ETD (Eustachian tube dysfunction), bilateral 05/24/2021   Gastroesophageal reflux disease without esophagitis 12/15/2017   Dysphagia 12/15/2017   History of oral cancer 12/15/2017   Increased prostate specific antigen (PSA) velocity 11/27/2017   Osteopenia 08/01/2017   Vitamin B12 deficiency anemia 06/09/2014   Chest pain 03/13/2013   Depression with anxiety 08/26/2011   Overweight (BMI 25.0-29.9) 08/26/2011   Joint pain 08/26/2011   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   Oral cancer (HCC) 07/09/2010    PCP: Watt Harlene BROCKS, MD   REFERRING PROVIDER: Genelle Standing, MD  REFERRING DIAG: M53.3,G89.29 (ICD-10-CM) - Chronic left SI joint pain. Rt knee pain, Rt shoulder sprain  Rationale for Evaluation and Treatment: Rehabilitation  THERAPY DIAG:  Other low back pain  Pain in left hip  Chronic pain of right knee  Difficulty in walking, not elsewhere classified  Acute pain of right  shoulder  Muscle weakness (generalized)  ONSET DATE: chronic pain for years  SUBJECTIVE:                                                                                                                                                                                           SUBJECTIVE STATEMENT: He relays doing well overall, gets some twinges in pain at times but has been able to manage this much better since starting PT. Feels ready to transition to independent program today.   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 Yes: NPRS scale: 1-3/10 Pain location: back and hip, knee Pain description: ache Aggravating factors: stairs, worse first thing in the morning Relieving factors: meloxicam ,  voltaren  PRECAUTIONS: None  WEIGHT BEARING RESTRICTIONS: No  FALLS:  Has patient fallen in last 6 months? No  OCCUPATION: retired  PLOF: Independent  PATIENT GOALS: reduce pain,   NEXT MD VISIT:   OBJECTIVE:   DIAGNOSTIC FINDINGS:  Xray (right knee 4 views, left knee 4 views): Right knee with very mild degenerative findings particularly about the medial joint space  PATIENT SURVEYS:  Eval: FOTO 67% functional   COGNITION: Overall cognitive status: Within functional limits for tasks assessed     SENSATION: WFL  MUSCLE LENGTH: Hamstrings: mildly tight at 80 deg bilat ITB tightness mildly on Rt  POSTURE:   PALPATION: Tender to palpation over Lt SIJ  LUMBAR ROM:   AROM eval  Flexion WNL  Extension WNL  Right lateral flexion WNL  Left lateral flexion WNL  Right rotation WNL  Left rotation WNL   (Blank rows = not tested)   ROM:     Active  Right eval Left eval Rt/Lt 04/28/22 Right/Left 10/05/22 Right 11/02/22  Hip flexion [facility]     Hip extension       Hip abduction [facility]     Hip adduction       Hip internal rotation       Hip external rotation       Knee flexion [facility]     Knee extension [facility]     Shoulder flexion   150 160 bilat WFL  Shoulder abduction   155  160 bilat WFL                 (Blank rows = not tested)   MMT:    MMT Right eval Left eval Rt/Lt 04/28/22 Rt/Lt 07/05/22 Rt/Lt 08/24/22 Rt/Lt 11/02/22 Rt/Lt 01/24/23  Hip flexion 4+ 4+ 5/5 5/5 5/5    Hip extension         Hip  abduction 4+ 4+ 4+/4+ 4+/4+ 4+/4+ 4+/4+ 5/5  Hip adduction         Hip internal rotation         Hip external rotation         Knee flexion 5 5 5/5  5/5 5/5   Knee extension 5- 5- 5/5  5/5 5/5   Ankle dorsiflexion 4 5 4/5 4+/5              Shoulder flexion   5/5   5/5   Shoulder abduction   4+/5   5/5   Shoulder IR   5/5   5/5   Shoulder ER   4/4+   5/5    (Blank rows = not tested)  LUMBAR SPECIAL TESTS:  Eval: Straight leg raise test: Negative  and Slump test: Negative Negative SIJ testing No pain with PAM testing to lumbar spine  FUNCTIONAL TESTS:  Eval: SLS X 10 seconds on left, 3-4 seconds on Rt 06/21/22: SLS 6 sec avg on Rt 11/02/22: SLS 13 sec avg on Rt  GAIT: Eval: Comments: independent community Ambulator, 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  TODAY'S TREATMENT:  01/24/23 -Bike L5 for 6 min -Seated row machine 45# 3X12 -Seated lat pulldown 45# 2X12 -Seated chest press 35# 2X12 -Kettlebell swings 30# 2X10 -Suitcase/Farmers carry 30# 75 feet 3 laps each arm -Deadlift floor to waist 30# 2X12 with cues for form -Bent over rows 30# X 12 bilat -Bicep curls 30# X 12 -HEP review with additions of kettlebell exercises    PATIENT EDUCATION: Education details: HEP, PT plan of care Person educated: Patient Education method: Explanation, Demonstration, Verbal cues, and Handouts Education comprehension: verbalized understanding and needs further education   HOME EXERCISE PROGRAM: Access Code: [email or web address] URL: [email or web address] Date: 07/05/2022 Prepared by: Redell Moose  Exercises - Single Leg Bridge  - 2 x daily - 6 x weekly - 1-2 sets - 10 reps - 5 hold - Seated Hamstring Stretch  - 2 x daily - 6 x weekly - 1 sets - 2 reps - 30 hold - Standing ITB Stretch  - 2 x daily - 6 x weekly - 2-3 sets - 30 hold - Heel Toe Raises with Counter Support  - 2 x daily - 6 x weekly - 2-3 sets - 10 reps - Single Leg Stance  - 2 x daily - 6 x weekly - 1 sets - 5 reps - 10 sec hold - Side Stepping with Resistance at Ankles  - 2 x daily - 6 x weekly - 2-3 sets - 10 reps - Sit to Stand Without Arm Support  - 2 x daily - 6 x weekly - 1-2 sets - 10 reps - Supine Active Straight Leg Raise  - 2 x daily - 6 x weekly - 1-2 sets - 12 reps - Sidelying Hip Abduction (Both Sides)  - 2 x daily - 6 x weekly - 1-2 sets - 12 reps - Prone Hip Extension  - 2 x daily - 6 x weekly - 1-2 sets - 12 reps - Bird Dog  - 2 x daily - 6 x weekly - 1 sets  - 12 reps - 2 sec hold - Supine Piriformis Stretch with Foot on Ground  - 2 x daily - 6 x weekly - 2 sets - 30 hold - Tandem Walking  - 2 x daily - 6 x weekly - 3 sets - 12 reps - Shoulder External Rotation with Anchored Resistance  - 2 x daily -  6 x weekly - 2-3 sets - 12 reps - Shoulder External Rotation and Scapular Retraction with Resistance  - 2 x daily - 6 x weekly - 2-3 sets - 12 reps - Standing Shoulder Horizontal Abduction with Resistance  - 2 x daily - 6 x weekly - 2-3 sets - 10 reps - Standing Single Arm Shoulder Abduction with Resistance (Mirrored)  - 2 x daily - 6 x weekly - 2-3 sets - 12 reps - Heel Walking  - 2 x daily - 6 x weekly - 2-3 sets - 12 reps - Prone Alternating Arm and Leg Lifts  - 1 x daily - 3 x weekly - 1-2 sets - 12 reps - Prone Double Leg Lift  - 1 x daily - 3 x weekly - 1-2 sets - 12 reps - Quadruped Leg Lifts  - 1 x daily - 3 x weekly - 1-2 sets - 12 reps - Plank on Knees  - 1 x daily - 3 x weekly - 1 sets - 6 reps - 20-30 sec hold - Seated Thoracic Lumbar Extension with Pectoralis Stretch  - 1 x daily - 3 x weekly - 1 sets - 12 reps - 6 sec hold - Standing Thoracic Extension at Wall  - 1 x daily - 3 x weekly - 1 sets - 12 reps - Half Deadlift with Kettlebell  - 1 x daily - 3 x weekly - 3 sets - 10 reps - 6 sec hold - Standing Diagonal Chop  - 1 x daily - 3 x weekly - 2 sets - 12 reps - Anti-Rotation Press With Sidesteps and Anchored Resistance  - 1 x daily - 3 x weekly - 2 sets - 10 reps  ASSESSMENT:  CLINICAL IMPRESSION: He has made great progress with PT. He has met PT goals and he feels ready to discharge to independent program.   OBJECTIVE IMPAIRMENTS: decreased activity tolerance, difficulty walking, decreased balance, decreased endurance, decreased mobility, decreased ROM, decreased strength, impaired flexibility, impaired LE use, postural dysfunction, and pain.  ACTIVITY LIMITATIONS: bending, lifting, carry, locomotion, cleaning, community  activity,  PERSONAL FACTORS: childhood polio, OA are also affecting patient's functional outcome.  REHAB POTENTIAL: Good  CLINICAL DECISION MAKING: Stable/uncomplicated  EVALUATION COMPLEXITY: Low    GOALS: Short term PT Goals Target date: 03/07/2022   Pt will be I and compliant with HEP. Baseline:  Goal status: MET 03/07/22 Pt will decrease pain by 25% overall Baseline: Goal status: MET 03/07/22  Long term PT goals Target date:12/28/22   Pt will improve  hip/knee strength to at least 5-/5 MMT to improve functional strength Baseline: Goal status: MET 01/24/23 Pt will improve FOTO to at least 69% functional to show improved function Baseline: Goal status: DC this goal as foto was closed out. Pt will reduce pain to overall less than 2-3/10 with usual activity and kayaking Baseline: Goal status: MET 01/24/23 4. Pt will be able to hold SLS X 10 seconds on avg for his Rt leg to show improved balance.  Baseline: 2-3 Goal status: MET 11/02/22, improved to 13+ seconds  PLAN: PT FREQUENCY: 1 times per 2 weeks  PT DURATION: 12 weeks  PLANNED INTERVENTIONS (unless contraindicated): aquatic PT, Canalith repositioning, cryotherapy, Electrical stimulation, Iontophoresis with 4 mg/ml dexamethasome, Moist heat, traction, Ultrasound, gait training, Therapeutic exercise, balance training, neuromuscular re-education, patient/family education, prosthetic training, manual techniques, passive ROM, dry needling, taping, vasopnuematic device, vestibular, spinal manipulations, joint manipulations  PLAN FOR NEXT SESSION: DC today   Redell SAUNDERS  Maranda, PT,DPT 01/24/2023, 2:37 PM

## 2023-02-02 DIAGNOSIS — F410 Panic disorder [episodic paroxysmal anxiety] without agoraphobia: Secondary | ICD-10-CM | POA: Diagnosis not present

## 2023-02-02 DIAGNOSIS — F422 Mixed obsessional thoughts and acts: Secondary | ICD-10-CM | POA: Diagnosis not present

## 2023-02-23 DIAGNOSIS — K149 Disease of tongue, unspecified: Secondary | ICD-10-CM | POA: Diagnosis not present

## 2023-03-01 ENCOUNTER — Other Ambulatory Visit: Payer: Self-pay | Admitting: Family Medicine

## 2023-03-01 DIAGNOSIS — F988 Other specified behavioral and emotional disorders with onset usually occurring in childhood and adolescence: Secondary | ICD-10-CM

## 2023-03-02 ENCOUNTER — Encounter: Payer: Self-pay | Admitting: Family Medicine

## 2023-03-02 DIAGNOSIS — Z9109 Other allergy status, other than to drugs and biological substances: Secondary | ICD-10-CM

## 2023-03-02 DIAGNOSIS — M542 Cervicalgia: Secondary | ICD-10-CM

## 2023-03-02 MED ORDER — AMPHETAMINE-DEXTROAMPHETAMINE 20 MG PO TABS: 20.0000 mg | ORAL_TABLET | Freq: Two times a day (BID) | ORAL | 0 refills | Status: DC

## 2023-03-02 NOTE — Telephone Encounter (Signed)
 Requesting: Adderall 20 mg Contract: N/A UDS: N/A Last Visit: 06/27/2022 Next Visit: N/A Last Refill: 11/08/2022  Please Advise

## 2023-03-03 MED ORDER — FLUTICASONE PROPIONATE 50 MCG/ACT NA SUSP
NASAL | 0 refills | Status: DC
Start: 2023-03-03 — End: 2023-04-07

## 2023-03-09 DIAGNOSIS — F422 Mixed obsessional thoughts and acts: Secondary | ICD-10-CM | POA: Diagnosis not present

## 2023-03-09 DIAGNOSIS — F410 Panic disorder [episodic paroxysmal anxiety] without agoraphobia: Secondary | ICD-10-CM | POA: Diagnosis not present

## 2023-03-09 NOTE — Progress Notes (Signed)
 [facility] [address], [address] [place], [place] [address] [id number]  Date:  03/13/2023   Name:  Jonathan Garrison   DOB:  05/29/1945   MRN:  [id number]  PCP:  Pearline Cables, MD    Chief Complaint: No chief complaint on file.   History of Present Illness:  Jonathan Garrison is a 78 y.o. very pleasant male patient who presents with the following:  Pt seen today for follow-up- concerns about arthritis, etc  Last seen by myself in June- History of anxiety, depression, oral cancer, osteopenia, B12 deficiency, and GERD.  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   He notes he had a recent sexual encounter and would like to have STI testing.  I was not able to get HIV testing covered under Medicare however-he decided to forego this testing He wonders about measles- we are pretty sure he had measles as a child.  I advised most people born before 1957 are considered to be immune and routine testing for immunity is not recommended  Flu shot UTD Covid booster RSV done  Shingrix done  Some labs done in May He is fasting today   He was seen by oral surgeon on 1/30- all ok from an oral cancer standpoint He is seeing derm and had treatment for some AK's recently- seemed to do well   He has some arthritis in his hands, esp at base of thumb after exercise or playing guitar Voltaren gel helps  He did get a steroid injection in his right knee in the past that helped  He does his best to stay active, does have some aches and pains  We note his blood pressure is a bit elevated today, he notes the drive to the clinic can be fairly stressful.  He does have a blood pressure cuff at home and can monitor things  Lab Results  Component Value Date   PSA 1.19 06/01/2022   PSA 1.40 03/23/2022   PSA 0.91 03/18/2021   BP Readings from Last 3 Encounters:  03/13/23 (!) 152/83  12/14/22 120/74  08/16/22 134/76     Patient Active Problem List    Diagnosis Date Noted   Hyperlipidemia 05/25/2022   Localized osteoarthritis of both shoulders 03/22/2022   Lower back pain 03/22/2022   Neck pain, chronic 03/22/2022   IT band syndrome 03/22/2022   Essential hypertension 02/22/2022   Family history of heart disease 02/22/2022   Trochanteric bursitis of left hip 08/01/2021   Pain of both hip joints 07/20/2021   Seasonal allergic rhinitis 05/24/2021   ETD (Eustachian tube dysfunction), bilateral 05/24/2021   Gastroesophageal reflux disease without esophagitis 12/15/2017   Dysphagia 12/15/2017   History of oral cancer 12/15/2017   Increased prostate specific antigen (PSA) velocity 11/27/2017   Osteopenia 08/01/2017   Vitamin B12 deficiency anemia 06/09/2014   Chest pain 03/13/2013   Depression with anxiety 08/26/2011   Overweight (BMI 25.0-29.9) 08/26/2011   Joint pain 08/26/2011   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   Oral cancer (HCC) 07/09/2010    Past Medical History:  Diagnosis Date   ADD (attention deficit disorder) without hyperactivity    Allergy    Anemia    no treatment   Anxiety    Arthralgia    many joints, managed with vitamins/herbs and ibuprofen   Arthritis    Cataract    Chest pain    Colitis 1986   single attack (believes was  ulcerative)   Depression    Eczema    Esophageal stricture    GERD (gastroesophageal reflux disease)    Hemorrhoids    Lichen planus    Neuromuscular disorder (HCC)    POLIO AGE 39   Oral cancer (HCC) 05/2010   plans for surgery 06/2010   Osteoporosis    hips   Pleurisy 1984   Polio age 45   Umbilical hernia     Past Surgical History:  Procedure Laterality Date   COLONOSCOPY  2012   LEG SURGERY  when in 6th grade   L leg, bone spur removal (just above knee, laterally)   TONGUE SURGERY     for tongue cancer   UPPER GASTROINTESTINAL ENDOSCOPY      Social History   Tobacco Use   Smoking status: Former    Current packs/day: 0.00    Types: Pipe, Cigarettes     Quit date: 01/25/1976    Years since quitting: 47.1   Smokeless tobacco: Never   Tobacco comments:    Quit smoking 35 years ago  Vaping Use   Vaping status: Never Used  Substance Use Topics   Alcohol use: Yes    Alcohol/week: 7.0 - 14.0 standard drinks of alcohol    Types: 7 - 14 Glasses of wine per week   Drug use: No    Family History  Problem Relation Age of Onset   Hypertension Mother    Diabetes Mother    Hyperlipidemia Mother    Hypertension Father    Benign prostatic hyperplasia Father    Heart disease Father    Lichen planus Sister    Cancer Sister        ?spot on chest? contained   Diabetes Maternal Grandfather    Colon cancer Neg Hx    Esophageal cancer Neg Hx    Stomach cancer Neg Hx    Pancreatic cancer Neg Hx    Liver disease Neg Hx    Colon polyps Neg Hx    Rectal cancer Neg Hx     No Known Allergies  Medication list has been reviewed and updated.  Current Outpatient Medications on File Prior to Visit  Medication Sig Dispense Refill   amphetamine-dextroamphetamine (ADDERALL) 20 MG tablet Take 1 tablet (20 mg total) by mouth 2 (two) times daily. 60 tablet 0   aspirin 81 MG tablet Take 81 mg by mouth daily.     b complex vitamins tablet Take 0.5 tablets by mouth daily.     Biotin 5000 MCG CAPS Take 1 capsule by mouth daily.     Calcium-Magnesium-Zinc 167-83-8 MG TABS Take by mouth. 1 tablet daily     Cholecalciferol (VITAMIN D3) 2000 UNITS capsule Take 2,000 Units by mouth daily.     ferrous sulfate 324 (65 Fe) MG TBEC Take one every other day for low iron 45 tablet 3   fluticasone (FLONASE) 50 MCG/ACT nasal spray SHAKE LIQUID AND USE 2 SPRAYS IN EACH NOSTRIL DAILY 16 g 0   glucosamine-chondroitin 500-400 MG tablet Take 1 tablet by mouth 2 (two) times daily.     loratadine (CLARITIN) 10 MG tablet Take 10 mg by mouth daily.     losartan (COZAAR) 100 MG tablet TAKE 1 TABLET(100 MG) BY MOUTH DAILY 90 tablet 2   meloxicam (MOBIC) 15 MG tablet TAKE 1 TABLET(15  MG) BY MOUTH DAILY AS NEEDED 90 tablet 2   Multiple Vitamins-Minerals (MULTIVITAMIN WITH MINERALS) tablet Take 1 tablet by mouth daily.  NON FORMULARY ALJ -herbal decongestant  Takes as needed     omeprazole (PRILOSEC) 40 MG capsule Take 1 capsule (40 mg total) by mouth in the morning and at bedtime. 180 capsule 3   rosuvastatin (CRESTOR) 20 MG tablet TAKE 1 TABLET(20 MG) BY MOUTH DAILY 90 tablet 1   sildenafil (REVATIO) 20 MG tablet Take 2-3 tablets one hour prior to intercourse 60 tablet 5   Zn-Pyg Afri-Nettle-Saw Palmet (SAW PALMETTO COMPLEX PO) Take 1 tablet by mouth 2 (two) times daily.     No current facility-administered medications on file prior to visit.    Review of Systems:  As per HPI- otherwise negative.   Physical Examination: Vitals:   03/13/23 1307 03/13/23 1342  BP: (!) 158/85 (!) 152/83  Pulse: 90    Vitals:   03/13/23 1307  Weight: 200 lb 9.6 oz (91 kg)  Height: 6\' 2"  (1.88 m)   Body mass index is 25.76 kg/m. Ideal Body Weight: Weight in (lb) to have BMI = 25: 194.3  GEN: no acute distress.  Normal weight, looks well HEENT: Atraumatic, Normocephalic.  Bilateral TM wnl, oropharynx normal.  PEERL,EOMI.   Ears and Nose: No external deformity. CV: RRR, No M/G/R. No JVD. No thrill. No extra heart sounds. PULM: CTA B, no wheezes, crackles, rhonchi. No retractions. No resp. distress. No accessory muscle use. ABD: S, NT, ND EXTR: No c/c/e PSYCH: Normally interactive. Conversant.  Arthritis at left first MCP.  Recent cryotherapy of actinic keratoses on scalp  Assessment and Plan: Attention deficit disorder (ADD) without hyperactivity  Essential hypertension - Plan: CBC, Comprehensive metabolic panel  Gastroesophageal reflux disease without esophagitis  Routine screening for STI (sexually transmitted infection) - Plan: RPR, HSV(herpes simplex vrs) 1+2 ab-IgG, Hepatitis C antibody, Hepatitis B surface antigen, CANCELED: HIV Antibody (routine testing w  rflx)  Screening, lipid - Plan: Lipid panel  Special screening, prostate cancer - Plan: PSA, Medicare ( [place] [facility] only)  High risk sexual behavior, unspecified type - Plan: CANCELED: HIV Antibody (routine testing w rflx)  Exposure to body fluid - Plan: CANCELED: HIV Antibody (routine testing w rflx)  Patient seen today with a few concerns.  He occasionally takes Adderall for ADHD symptoms, does not need a refill today however. Blood pressure is typically under good control on current regimen-although elevated today.  He will monitor at home and let me know if it remains elevated Reflux has been under control STI screening, follow-up on lipids and PSA today   Signed Abbe Amsterdam, MD  Addnd 2/18- received labs as below, message to pt Some still pending Results for orders placed or performed in visit on 03/13/23  Lipid panel   Collection Time: 03/13/23  2:02 PM  Result Value Ref Range   Cholesterol 159 0 - 200 mg/dL   Triglycerides 16.1 0.0 - 149.0 mg/dL   HDL 09.60 >45.40 mg/dL   VLDL 9.8 0.0 - 98.1 mg/dL   LDL Cholesterol 78 0 - 99 mg/dL   Total CHOL/HDL Ratio 2    NonHDL 88.26   CBC   Collection Time: 03/13/23  2:02 PM  Result Value Ref Range   WBC 5.1 4.0 - 10.5 K/uL   RBC 4.27 4.22 - 5.81 Mil/uL   Platelets 239.0 150.0 - 400.0 K/uL   Hemoglobin 13.4 13.0 - 17.0 g/dL   HCT 19.1 47.8 - 29.5 %   MCV 93.9 78.0 - 100.0 fl   MCHC 33.3 30.0 - 36.0 g/dL   RDW 62.1 30.8 - 65.7 %  Comprehensive metabolic panel   Collection Time: 03/13/23  2:02 PM  Result Value Ref Range   Sodium 141 135 - 145 mEq/L   Potassium 4.5 3.5 - 5.1 mEq/L   Chloride 105 96 - 112 mEq/L   CO2 28 19 - 32 mEq/L   Glucose, Bld 86 70 - 99 mg/dL   BUN 28 (H) 6 - 23 mg/dL   Creatinine, Ser 8.29 0.40 - 1.50 mg/dL   Total Bilirubin 1.0 0.2 - 1.2 mg/dL   Alkaline Phosphatase 53 39 - 117 U/L   AST 33 0 - 37 U/L   ALT 28 0 - 53 U/L   Total Protein 6.5 6.0 - 8.3 g/dL   Albumin 4.3 3.5 - 5.2 g/dL    GFR 56.21 >30.86 mL/min   Calcium 9.5 8.4 - 10.5 mg/dL  PSA, Medicare ( [facility] only)   Collection Time: 03/13/23  2:02 PM  Result Value Ref Range   PSA 1.34 0.10 - 4.00 ng/ml

## 2023-03-10 ENCOUNTER — Other Ambulatory Visit: Payer: Self-pay | Admitting: Family Medicine

## 2023-03-10 ENCOUNTER — Encounter: Payer: Self-pay | Admitting: Family Medicine

## 2023-03-10 DIAGNOSIS — M542 Cervicalgia: Secondary | ICD-10-CM

## 2023-03-10 MED ORDER — MELOXICAM 15 MG PO TABS: ORAL_TABLET | ORAL | 2 refills | Status: DC

## 2023-03-10 NOTE — Addendum Note (Signed)
Addended by: Abbe Amsterdam C on: 03/10/2023 05:50 PM   Modules accepted: Orders

## 2023-03-11 ENCOUNTER — Other Ambulatory Visit: Payer: Self-pay | Admitting: Cardiovascular Disease

## 2023-03-13 ENCOUNTER — Encounter: Payer: Self-pay | Admitting: Family Medicine

## 2023-03-13 ENCOUNTER — Ambulatory Visit (INDEPENDENT_AMBULATORY_CARE_PROVIDER_SITE_OTHER): Payer: Medicare Other | Admitting: Family Medicine

## 2023-03-13 VITALS — BP 152/83 | HR 90 | Ht 74.0 in | Wt 200.6 lb

## 2023-03-13 DIAGNOSIS — Z113 Encounter for screening for infections with a predominantly sexual mode of transmission: Secondary | ICD-10-CM

## 2023-03-13 DIAGNOSIS — K219 Gastro-esophageal reflux disease without esophagitis: Secondary | ICD-10-CM | POA: Diagnosis not present

## 2023-03-13 DIAGNOSIS — Z125 Encounter for screening for malignant neoplasm of prostate: Secondary | ICD-10-CM | POA: Diagnosis not present

## 2023-03-13 DIAGNOSIS — Z13220 Encounter for screening for lipoid disorders: Secondary | ICD-10-CM

## 2023-03-13 DIAGNOSIS — I10 Essential (primary) hypertension: Secondary | ICD-10-CM | POA: Diagnosis not present

## 2023-03-13 DIAGNOSIS — Z7251 High risk heterosexual behavior: Secondary | ICD-10-CM | POA: Diagnosis not present

## 2023-03-13 DIAGNOSIS — F988 Other specified behavioral and emotional disorders with onset usually occurring in childhood and adolescence: Secondary | ICD-10-CM | POA: Diagnosis not present

## 2023-03-13 DIAGNOSIS — Z7721 Contact with and (suspected) exposure to potentially hazardous body fluids: Secondary | ICD-10-CM | POA: Diagnosis not present

## 2023-03-13 NOTE — Patient Instructions (Signed)
 Good to see you today- I will be in touch with your labs asap  Please keep an eye on your BP at home- let me know if continues to run higher than 135/85 on a regular basis

## 2023-03-14 ENCOUNTER — Encounter: Payer: Self-pay | Admitting: Family Medicine

## 2023-03-14 ENCOUNTER — Other Ambulatory Visit: Payer: Self-pay | Admitting: Family Medicine

## 2023-03-14 DIAGNOSIS — F988 Other specified behavioral and emotional disorders with onset usually occurring in childhood and adolescence: Secondary | ICD-10-CM

## 2023-03-14 LAB — CBC
HCT: 40.1 % (ref 39.0–52.0)
Hemoglobin: 13.4 g/dL (ref 13.0–17.0)
MCHC: 33.3 g/dL (ref 30.0–36.0)
MCV: 93.9 fL (ref 78.0–100.0)
Platelets: 239 10*3/uL (ref 150.0–400.0)
RBC: 4.27 Mil/uL (ref 4.22–5.81)
RDW: 14 % (ref 11.5–15.5)
WBC: 5.1 10*3/uL (ref 4.0–10.5)

## 2023-03-14 LAB — COMPREHENSIVE METABOLIC PANEL
ALT: 28 U/L (ref 0–53)
AST: 33 U/L (ref 0–37)
Albumin: 4.3 g/dL (ref 3.5–5.2)
Alkaline Phosphatase: 53 U/L (ref 39–117)
BUN: 28 mg/dL — ABNORMAL HIGH (ref 6–23)
CO2: 28 meq/L (ref 19–32)
Calcium: 9.5 mg/dL (ref 8.4–10.5)
Chloride: 105 meq/L (ref 96–112)
Creatinine, Ser: 0.87 mg/dL (ref 0.40–1.50)
GFR: 83.76 mL/min (ref >60.00)
Glucose, Bld: 86 mg/dL (ref 70–99)
Potassium: 4.5 meq/L (ref 3.5–5.1)
Sodium: 141 meq/L (ref 135–145)
Total Bilirubin: 1 mg/dL (ref 0.2–1.2)
Total Protein: 6.5 g/dL (ref 6.0–8.3)

## 2023-03-14 LAB — LIPID PANEL
Cholesterol: 159 mg/dL (ref 0–200)
HDL: 70.4 mg/dL (ref >39.00)
LDL Cholesterol: 78 mg/dL (ref 0–99)
NonHDL: 88.26
Total CHOL/HDL Ratio: 2
Triglycerides: 49 mg/dL (ref 0.0–149.0)
VLDL: 9.8 mg/dL (ref 0.0–40.0)

## 2023-03-14 LAB — PSA, MEDICARE: PSA: 1.34 ng/mL (ref 0.10–4.00)

## 2023-03-14 LAB — HEPATITIS C ANTIBODY: Hepatitis C Ab: NONREACTIVE

## 2023-03-14 LAB — HEPATITIS B SURFACE ANTIGEN: Hepatitis B Surface Ag: NONREACTIVE

## 2023-03-14 LAB — HSV(HERPES SIMPLEX VRS) I + II AB-IGG
HSV 1 IGG,TYPE SPECIFIC AB: 33.5 {index} — ABNORMAL HIGH
HSV 2 IGG,TYPE SPECIFIC AB: 4.08 {index} — ABNORMAL HIGH

## 2023-03-14 LAB — RPR: RPR Ser Ql: NONREACTIVE

## 2023-03-15 ENCOUNTER — Encounter: Payer: Self-pay | Admitting: Family Medicine

## 2023-03-30 DIAGNOSIS — F422 Mixed obsessional thoughts and acts: Secondary | ICD-10-CM | POA: Diagnosis not present

## 2023-03-30 DIAGNOSIS — F410 Panic disorder [episodic paroxysmal anxiety] without agoraphobia: Secondary | ICD-10-CM | POA: Diagnosis not present

## 2023-04-06 ENCOUNTER — Encounter: Payer: Self-pay | Admitting: Physical Therapy

## 2023-04-07 ENCOUNTER — Encounter: Payer: Self-pay | Admitting: Internal Medicine

## 2023-04-07 ENCOUNTER — Other Ambulatory Visit: Payer: Self-pay | Admitting: Family Medicine

## 2023-04-07 ENCOUNTER — Encounter: Payer: Self-pay | Admitting: Family Medicine

## 2023-04-07 DIAGNOSIS — Z9109 Other allergy status, other than to drugs and biological substances: Secondary | ICD-10-CM

## 2023-04-07 MED ORDER — FLUTICASONE PROPIONATE 50 MCG/ACT NA SUSP: 2.0000 | Freq: Every day | NASAL | 5 refills | Status: AC

## 2023-04-12 ENCOUNTER — Ambulatory Visit (HOSPITAL_BASED_OUTPATIENT_CLINIC_OR_DEPARTMENT_OTHER): Admitting: Orthopaedic Surgery

## 2023-04-12 ENCOUNTER — Encounter (HOSPITAL_BASED_OUTPATIENT_CLINIC_OR_DEPARTMENT_OTHER): Payer: Self-pay | Admitting: Orthopaedic Surgery

## 2023-04-12 ENCOUNTER — Other Ambulatory Visit (HOSPITAL_BASED_OUTPATIENT_CLINIC_OR_DEPARTMENT_OTHER): Payer: Self-pay | Admitting: Orthopaedic Surgery

## 2023-04-12 DIAGNOSIS — M25561 Pain in right knee: Secondary | ICD-10-CM

## 2023-04-12 MED ORDER — TRIAMCINOLONE ACETONIDE 40 MG/ML IJ SUSP
80.0000 mg | INTRAMUSCULAR | Status: AC | PRN
  Administered 2023-04-12: 80 mg via INTRA_ARTICULAR

## 2023-04-12 MED ORDER — LIDOCAINE HCL 1 % IJ SOLN
4.0000 mL | INTRAMUSCULAR | Status: AC | PRN
  Administered 2023-04-12: 4 mL

## 2023-04-12 NOTE — Progress Notes (Signed)
 Chief Complaint: Right shoulder pain, left hip pain     History of Present Illness:   04/12/2023: Presents today for follow-up again of his right knee as well of his left hip.  He did get extremely good relief from his previous knee injection.  He is hoping to pursue an additional injection.   Chistian Garrison is a 78 y.o. male presents today with ongoing right knee pain as well as left knee tightness particularly in the morning.  Of note he is very active and previously was an avid basketball player.  He did have to quit last summer as a result of the right knee.  He does have pain about the lateral aspect of the knee at rest.  He was seen by an outside orthopedist who recommended injection for tendinitis.  He does enjoy white water kayaking.  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  He wears a custom insole for this.  Finally he does endorse left-sided SI type back pain for which she has been working on a good stretching program of his back.  He previously worked as a professor at [employer]    Surgical History:   None  PMH/PSH/Family History/Social History/Meds/Allergies:    Past Medical History:  Diagnosis Date  . ADD (attention deficit disorder) without hyperactivity   . Allergy   . Anemia    no treatment  . Anxiety   . Arthralgia    many joints, managed with vitamins/herbs and ibuprofen  . Arthritis   . Cataract   . Chest pain   . Colitis 1986   single attack (believes was ulcerative)  . Depression   . Eczema   . Esophageal stricture   . GERD (gastroesophageal reflux disease)   . Hemorrhoids   . Lichen planus   . Neuromuscular disorder (HCC)    POLIO AGE 58  . 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 Osteoporosis    hips  . Pleurisy 1984  . Polio age 46  . Umbilical hernia    Past Surgical History:  Procedure Laterality Date  . COLONOSCOPY  2012  . LEG SURGERY  when in 6th grade   L leg, bone  spur removal (just above knee, laterally)  . TONGUE SURGERY     for tongue cancer  . UPPER GASTROINTESTINAL ENDOSCOPY     Social History   Socioeconomic History  . Marital status: Single    Spouse name: Not on file  . Number of children: Not on file  . Years of education: Not on file  . Highest education level: Not on file  Occupational History  . 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 Smoking status: Former    Current packs/day: 0.00    Types: Pipe, Cigarettes    Quit date: 01/25/1976    Years since quitting: 47.2  . Smokeless tobacco: Never  . Tobacco comments:    Quit smoking 35 years ago  Vaping Use  . Vaping status: Never Used  Substance and Sexual Activity  . Alcohol use: Yes    Alcohol/week: 7.0 - 14.0 standard drinks of alcohol    Types: 7 - 14 Glasses of wine per week  . Drug use: No  . Sexual activity:  Not on file  Other Topics Concern  . Not on file  Social History Narrative  . Not on file   Social Drivers of Health   Financial Resource Strain: Low Risk  (01/04/2022)   Overall Financial Resource Strain (CARDIA)   . Difficulty of Paying Living Expenses: Not hard at all  Food Insecurity: No Food Insecurity (01/04/2022)   Hunger Vital Sign   . Worried About Programme researcher, broadcasting/film/video in the Last Year: Never true   . Ran Out of Food in the Last Year: Never true  Transportation Needs: No Transportation Needs (01/04/2022)   PRAPARE - Transportation   . Lack of Transportation (Medical): No   . Lack of Transportation (Non-Medical): No  Physical Activity: Sufficiently Active (01/04/2022)   Exercise Vital Sign   . Days of Exercise per Week: 6 days   . Minutes of Exercise per Session: 60 min  Stress: No Stress Concern Present (01/04/2022)   [employer] of Occupational Health - Occupational Stress Questionnaire   . Feeling of Stress : Only a little  Social Connections: Moderately Isolated (01/04/2022)   Social  Connection and Isolation Panel [NHANES]   . Frequency of Communication with Friends and Family: Never   . Frequency of Social Gatherings with Friends and Family: More than three times a week   . Attends Religious Services: Never   . Active Member of Clubs or Organizations: Yes   . Attends Banker Meetings: More than 4 times per year   . Marital Status: Divorced   Family History  Problem Relation Age of Onset  . Hypertension Mother   . Diabetes Mother   . Hyperlipidemia Mother   . Hypertension Father   . Benign prostatic hyperplasia Father   . Heart disease Father   . Lichen planus Sister   . Cancer Sister        ?spot on chest? contained  . Diabetes Maternal Grandfather   . Colon cancer Neg Hx   . Esophageal cancer Neg Hx   . Stomach cancer Neg Hx   . Pancreatic cancer Neg Hx   . Liver disease Neg Hx   . Colon polyps Neg Hx   . Rectal cancer Neg Hx    No Known Allergies Current Outpatient Medications  Medication Sig Dispense Refill  . amphetamine-dextroamphetamine (ADDERALL) 20 MG tablet Take 1 tablet (20 mg total) by mouth 2 (two) times daily. 60 tablet 0  . aspirin 81 MG tablet Take 81 mg by mouth daily.    Marland Kitchen b complex vitamins tablet Take 0.5 tablets by mouth daily.    . Biotin 5000 MCG CAPS Take 1 capsule by mouth daily.    . Calcium-Magnesium-Zinc 167-83-8 MG TABS Take by mouth. 1 tablet daily    . Cholecalciferol (VITAMIN D3) 2000 UNITS capsule Take 2,000 Units by mouth daily.    . ferrous sulfate 324 (65 Fe) MG TBEC Take one every other day for low iron 45 tablet 3  . fluticasone (FLONASE) 50 MCG/ACT nasal spray Place 2 sprays into both nostrils daily. 16 g 5  . glucosamine-chondroitin 500-400 MG tablet Take 1 tablet by mouth 2 (two) times daily.    Marland Kitchen loratadine (CLARITIN) 10 MG tablet Take 10 mg by mouth daily.    Marland Kitchen losartan (COZAAR) 100 MG tablet TAKE 1 TABLET(100 MG) BY MOUTH DAILY 90 tablet 2  . meloxicam (MOBIC) 15 MG tablet TAKE 1 TABLET(15 MG) BY  MOUTH DAILY AS NEEDED 90 tablet 2  . Multiple Vitamins-Minerals (MULTIVITAMIN  WITH MINERALS) tablet Take 1 tablet by mouth daily.    . NON FORMULARY ALJ -herbal decongestant  Takes as needed    . omeprazole (PRILOSEC) 40 MG capsule Take 1 capsule (40 mg total) by mouth in the morning and at bedtime. 180 capsule 3  . rosuvastatin (CRESTOR) 20 MG tablet TAKE 1 TABLET(20 MG) BY MOUTH DAILY 90 tablet 1  . sildenafil (REVATIO) 20 MG tablet Take 2-3 tablets one hour prior to intercourse 60 tablet 5  . Zn-Pyg Afri-Nettle-Saw Palmet (SAW PALMETTO COMPLEX PO) Take 1 tablet by mouth 2 (two) times daily.     No current facility-administered medications for this visit.   No results found.  Review of Systems:   A ROS was performed including pertinent positives and negatives as documented in the HPI.  Physical Exam :   Constitutional: NAD and appears stated age Neurological: Alert and oriented Psych: Appropriate affect and cooperative There were no vitals taken for this visit.   Comprehensive Musculoskeletal Exam:    Tenderness palpation about the lateral trochanter.  Tenderness with resisted abduction of the left hip.  He has 30 degrees internal/external rotation of the left hip.  Right shoulder with pain radiating laterally about the deltoid.  Positive Neer impingement test.  Full active range of motion with good strength, negative belly press  He is tenderness palpation about the left SI joint.  There is tenderness about the right knee IT band laterally.  No joint line tenderness about the right knee.  Range of motion is from 0 to 130 degrees without crepitus.  He has bilateral cavovarus feet with hammertoes throughout.  Imaging:   Xray (right knee 4 views, left knee 4 views): Right knee with very mild degenerative findings particularly about the medial joint space    I personally reviewed and interpreted the radiographs.   Assessment:   78 y.o. male with right knee pain in the setting  of mild osteophyte medially.  Today's visit I did recommend an ultrasound-guided injection of this.  I will plan to see him back for an additional left hip ultrasound-guided injection in 2 weeks as well Plan :    -Right knee ultrasound-guided injection provided after verbal consent obtained    Procedure Note  Patient: Jonathan Garrison             Date of Birth: 1945-09-18           MRN: [id number]             Visit Date: 04/12/2023  Procedures: Visit Diagnoses: No diagnosis found.  Large Joint Inj: R knee on 04/12/2023 11:46 AM Indications: pain Details: 22 G 1.5 in needle, ultrasound-guided anterior approach  Arthrogram: No  Medications: 4 mL lidocaine 1 %; 80 mg triamcinolone acetonide 40 MG/ML Outcome: tolerated well, no immediate complications Procedure, treatment alternatives, risks and benefits explained, specific risks discussed. Consent was given by the patient. Immediately prior to procedure a time out was called to verify the correct patient, procedure, equipment, support staff and site/side marked as required. Patient was prepped and draped in the usual sterile fashion.          I personally saw and evaluated the patient, and participated in the management and treatment plan.  Huel Cote, MD Attending Physician, Orthopedic Surgery  This document was dictated using Dragon voice recognition software. A reasonable attempt at proof reading has been made to minimize errors.

## 2023-04-19 ENCOUNTER — Encounter: Payer: Self-pay | Admitting: Physician Assistant

## 2023-04-19 ENCOUNTER — Ambulatory Visit (INDEPENDENT_AMBULATORY_CARE_PROVIDER_SITE_OTHER): Admitting: Physician Assistant

## 2023-04-19 VITALS — BP 146/82 | HR 80 | Ht 74.0 in | Wt 196.0 lb

## 2023-04-19 DIAGNOSIS — K222 Esophageal obstruction: Secondary | ICD-10-CM

## 2023-04-19 DIAGNOSIS — Z85819 Personal history of malignant neoplasm of unspecified site of lip, oral cavity, and pharynx: Secondary | ICD-10-CM

## 2023-04-19 DIAGNOSIS — K449 Diaphragmatic hernia without obstruction or gangrene: Secondary | ICD-10-CM | POA: Diagnosis not present

## 2023-04-19 DIAGNOSIS — D519 Vitamin B12 deficiency anemia, unspecified: Secondary | ICD-10-CM | POA: Diagnosis not present

## 2023-04-19 DIAGNOSIS — K219 Gastro-esophageal reflux disease without esophagitis: Secondary | ICD-10-CM

## 2023-04-19 DIAGNOSIS — R1319 Other dysphagia: Secondary | ICD-10-CM

## 2023-04-19 NOTE — Patient Instructions (Addendum)
 You have been scheduled for an endoscopy. Please follow written instructions given to you at your visit today.  If you use inhalers (even only as needed), please bring them with you on the day of your procedure.  If you take any of the following medications, they will need to be adjusted prior to your procedure:   DO NOT TAKE 7 DAYS PRIOR TO TEST- Trulicity (dulaglutide) Ozempic, Wegovy (semaglutide) Mounjaro (tirzepatide) Bydureon Bcise (exanatide extended release)  DO NOT TAKE 1 DAY PRIOR TO YOUR TEST Rybelsus (semaglutide) Adlyxin (lixisenatide) Victoza (liraglutide) Byetta (exanatide) ___________________________________________________________________________  Due to recent changes in healthcare laws, you may see the results of your imaging and laboratory studies on MyChart before your provider has had a chance to review them.  We understand that in some cases there may be results that are confusing or concerning to you. Not all laboratory results come back in the same time frame and the provider may be waiting for multiple results in order to interpret others.  Please give [place] 48 hours in order for your provider to thoroughly review all the results before contacting the office for clarification of your results.  _______________________________________________________  If your blood pressure at your visit was 140/90 or greater, please contact your primary care physician to follow up on this.  _______________________________________________________  If you are age 17 or older, your body mass index should be between 23-30. Your Body mass index is 25.16 kg/m. If this is out of the aforementioned range listed, please consider follow up with your Primary Care Provider.  If you are age 35 or younger, your body mass index should be between 19-25. Your Body mass index is 25.16 kg/m. If this is out of the aformentioned range listed, please consider follow up with your Primary Care Provider.    ________________________________________________________  The [facility] providers would like to encourage you to use [facility] to communicate with providers for non-urgent requests or questions.  Due to long hold times on the telephone, sending your provider a message by [facility] may be a faster and more efficient way to get a response.  Please allow 48 business hours for a response.  Please remember that this is for non-urgent requests.  _______________________________________________________   Dysphagia precautions:  1. Take reflux medications 30+ minutes before food in the morning 2. Begin meals with warm beverage 3. Eat smaller more frequent meals 4. Eat slowly, taking small bites and sips 5. Alternate solids and liquids 6. Avoid foods/liquids that increase acid production 7. Sit upright during and for 30+ minutes after meals to facilitate esophageal clearing 8. All meats should be chopped finely.   If something gets hung in your esophagus and will not come up or go down, proceed to the emergency room.

## 2023-04-19 NOTE — Progress Notes (Signed)
 Noted.

## 2023-04-19 NOTE — Progress Notes (Signed)
 04/19/2023 Jonathan Garrison [id number] 04-04-1945  Referring provider: Pearline Cables, MD Primary GI doctor: Dr. Marina Goodell  ASSESSMENT AND PLAN:   Dysphagia with history of erosive esophagitis and GERD EGD May 07, 2021 status post dilation 20 mm of significant stricture distal esophagus Peptic stricturing requiring esophageal dilation Progressive dysphagia x 6 months worse with pills, food, can have regurgitation 02/2023 no anemia Omeprazole once or twice a day, increase to twice a day Mobic daily x 6-8 years, 1-2 beers daily, remote smoking, - left sublingual cancer s/p excision/ablation but follows every 6 months -Schedule EGD with dilatation to evaluate for stenosis, tumor, erosive/infectious esophagititis, and EOE.   If the EGD is negative and symptoms continue after PPI trial, can consider barium swallow  I discussed risks of EGD with patient today, including risk of sedation, bleeding or perforation.  -Patient provides understanding and gave verbal consent to proceed. -In the interim patient advised about swallowing precautions.  -Eat slowly, chew food well before swallowing.  -Drink liquids in between each bite to avoid food impaction. - need to try to stop ETOH and stop/cut back on mobic - increase omeprazole to twice a day  Screening colonoscopy 05/14/2020 colonoscopy Dr. Marina Goodell internal hemorrhoids otherwise unremarkable no recall screening secondary to age   Patient Care Team: Copland, Gwenlyn Found, MD as PCP - General (Family Medicine) Hilarie Fredrickson, MD (Gastroenterology) Estill Bamberg, MD (Orthopedic Surgery)  HISTORY OF PRESENT ILLNESS: 78 y.o. male with a past medical history listed below, known to Dr. Marina Goodell presents with dysphagia.  Discussed the use of AI scribe software for clinical note transcription with the patient, who gave verbal consent to proceed.  History of Present Illness   Jonathan Garrison "Annette Stable" is a 78 year old male with esophageal strictures who  presents with worsening swallowing difficulties.  He has a history of esophageal strictures, with the last dilation performed on May 07, 2021, where the stricture in the distal esophagus was dilated to 20 millimeters. Post-procedure, his swallowing improved, but symptoms have progressively worsened over the past six months. He experiences difficulty swallowing large pills, such as fish oil and glucosamine chondroitin, which sometimes get stuck and require food to help them pass. There is a sensation of food accumulating, particularly with certain textures like protein bars, which can cause gagging and require regurgitation to clear.  He is currently taking Prilosec (omeprazole) once daily, occasionally twice if experiencing increased heartburn, and Mobic (meloxicam) every morning for arthritis and osteopenia. He manages his symptoms by timing his pill intake with meals and avoiding certain foods like regular beef, opting for minced options like hamburgers instead. A burning sensation in the back of his throat occurs when consuming very sweet foods, leading him to avoid cookies and limit protein bars.  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 He attends regular six-month check-ups and reports being in good condition.  No weight loss, dark stools, or significant changes in bowel habits, although he notes firmer stools recently, possibly due to increased physical activity like kayaking. He consumes alcohol moderately, typically one to two beers daily, and occasionally wine or whiskey. He has a history of smoking in his twenties but has since quit.      He  reports that he quit smoking about 47 years ago. His smoking use included pipe and cigarettes. He has never used smokeless  tobacco. He reports current alcohol use of about 7.0 - 14.0 standard drinks of  alcohol per week. He reports that he does not use drugs.  RELEVANT GI HISTORY, IMAGING AND LABS: Results   DIAGNOSTIC Endoscopy: Stricture in distal esophagus, dilated to 20 mm; stomach normal (05/07/2021) Stress test: Normal  PATHOLOGY Oral biopsy: Squamous cell carcinoma, sublingual left     12/2022 Echo Vigorous normal LVF G1DD. Mod TR, mild MR.   CBC    Component Value Date/Time   WBC 5.1 03/13/2023 1402   RBC 4.27 03/13/2023 1402   HGB 13.4 03/13/2023 1402   HGB 13.7 06/06/2014 1406   HCT 40.1 03/13/2023 1402   HCT 40.0 06/06/2014 1406   PLT 239.0 03/13/2023 1402   PLT 250 06/06/2014 1406   MCV 93.9 03/13/2023 1402   MCV 90 06/06/2014 1406   MCH 29.8 09/22/2017 1515   MCHC 33.3 03/13/2023 1402   RDW 14.0 03/13/2023 1402   RDW 13.5 06/06/2014 1406   LYMPHSABS 1,350 06/18/2015 1018   LYMPHSABS 1.3 06/06/2014 1406   MONOABS 450 06/18/2015 1018   EOSABS 540 (H) 06/18/2015 1018   EOSABS 0.3 06/06/2014 1406   BASOSABS 45 06/18/2015 1018   BASOSABS 0.0 06/06/2014 1406   Recent Labs    06/01/22 1030 03/13/23 1402  HGB 13.6 13.4    CMP     Component Value Date/Time   NA 141 03/13/2023 1402   K 4.5 03/13/2023 1402   CL 105 03/13/2023 1402   CO2 28 03/13/2023 1402   GLUCOSE 86 03/13/2023 1402   BUN 28 (H) 03/13/2023 1402   CREATININE 0.87 03/13/2023 1402   CREATININE 0.86 10/03/2019 0839   CALCIUM 9.5 03/13/2023 1402   PROT 6.5 03/13/2023 1402   ALBUMIN 4.3 03/13/2023 1402   AST 33 03/13/2023 1402   ALT 28 03/13/2023 1402   ALKPHOS 53 03/13/2023 1402   BILITOT 1.0 03/13/2023 1402   GFRNONAA 89 06/18/2015 1018   GFRAA >89 06/18/2015 1018      Latest Ref Rng & Units 03/13/2023    2:02 PM 03/23/2022   11:13 AM 06/09/2021    9:57 AM  Hepatic Function  Total Protein 6.0 - 8.3 g/dL 6.5  6.3  6.6   Albumin 3.5 - 5.2 g/dL 4.3  3.9  4.3   AST 0 - 37 U/L 33  29  26   ALT 0 - 53 U/L 28  24  23    Alk Phosphatase 39 - 117 U/L 53  61  67   Total Bilirubin 0.2 -  1.2 mg/dL 1.0  0.6  0.9       Current Medications:    Current Outpatient Medications (Cardiovascular):    losartan (COZAAR) 100 MG tablet, TAKE 1 TABLET(100 MG) BY MOUTH DAILY   rosuvastatin (CRESTOR) 20 MG tablet, TAKE 1 TABLET(20 MG) BY MOUTH DAILY   sildenafil (REVATIO) 20 MG tablet, Take 2-3 tablets one hour prior to intercourse  Current Outpatient Medications (Respiratory):    fluticasone (FLONASE) 50 MCG/ACT nasal spray, Place 2 sprays into both nostrils daily.   loratadine (CLARITIN) 10 MG tablet, Take 10 mg by mouth daily.  Current Outpatient Medications (Analgesics):    aspirin 81 MG tablet, Take 81 mg by mouth daily.   meloxicam (MOBIC) 15 MG tablet, TAKE 1 TABLET(15 MG) BY MOUTH DAILY AS NEEDED  Current Outpatient Medications (Hematological):    ferrous sulfate 324 (65 Fe) MG TBEC, Take one every other day for low iron  Current Outpatient Medications (Other):  amphetamine-dextroamphetamine (ADDERALL) 20 MG tablet, Take 1 tablet (20 mg total) by mouth 2 (two) times daily.   b complex vitamins tablet, Take 0.5 tablets by mouth daily.   Biotin 5000 MCG CAPS, Take 1 capsule by mouth daily.   Calcium-Magnesium-Zinc 167-83-8 MG TABS, Take by mouth. 1 tablet daily   Cholecalciferol (VITAMIN D3) 2000 UNITS capsule, Take 2,000 Units by mouth daily.   glucosamine-chondroitin 500-400 MG tablet, Take 1 tablet by mouth 2 (two) times daily.   Multiple Vitamins-Minerals (MULTIVITAMIN WITH MINERALS) tablet, Take 1 tablet by mouth daily.   NON FORMULARY, ALJ -herbal decongestant  Takes as needed   omeprazole (PRILOSEC) 40 MG capsule, Take 1 capsule (40 mg total) by mouth in the morning and at bedtime.   Zn-Pyg Afri-Nettle-Saw Palmet (SAW PALMETTO COMPLEX PO), Take 1 tablet by mouth 2 (two) times daily.  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 Family History:  His family history includes Benign prostatic hyperplasia in his father; Cancer in his sister; Diabetes in his maternal grandfather and mother; Heart disease in his father; Hyperlipidemia in his mother; Hypertension in his father and mother; Lichen planus in his sister.  REVIEW OF SYSTEMS  : All other systems reviewed and negative except where noted in the History of Present Illness.  PHYSICAL EXAM: BP (!) 146/82   Pulse 80   Ht 6\' 2"  (1.88 m)   Wt 196 lb (88.9 kg)   BMI 25.16 kg/m  Physical Exam   GENERAL APPEARANCE: Well nourished, in no apparent distress. HEENT: No cervical lymphadenopathy, unremarkable thyroid, sclerae anicteric, conjunctiva pink. RESPIRATORY: Respiratory effort normal, breath sounds clear to auscultation bilaterally without rales, rhonchi, or wheezing. CARDIO: Regular rate and rhythm with no murmurs, rubs, or gallops, peripheral pulses intact. ABDOMEN: Soft, non-distended, active bowel sounds in all four quadrants, non-tender, no epigastric or lower abdominal pain, no rebound, no mass appreciated. RECTAL: Declines. MUSCULOSKELETAL: Full range of motion, normal gait, without edema. SKIN: Dry, intact without rashes or lesions. No jaundice. NEURO: Alert, oriented, no focal deficits. PSYCH:  Cooperative, normal mood and affect.      Doree Albee, PA-C 1:49 PM

## 2023-04-20 ENCOUNTER — Encounter: Payer: Self-pay | Admitting: Physical Therapy

## 2023-04-20 ENCOUNTER — Ambulatory Visit (INDEPENDENT_AMBULATORY_CARE_PROVIDER_SITE_OTHER): Admitting: Physical Therapy

## 2023-04-20 DIAGNOSIS — M6281 Muscle weakness (generalized): Secondary | ICD-10-CM | POA: Diagnosis not present

## 2023-04-20 DIAGNOSIS — M25552 Pain in left hip: Secondary | ICD-10-CM

## 2023-04-20 DIAGNOSIS — G8929 Other chronic pain: Secondary | ICD-10-CM | POA: Diagnosis not present

## 2023-04-20 DIAGNOSIS — R262 Difficulty in walking, not elsewhere classified: Secondary | ICD-10-CM | POA: Diagnosis not present

## 2023-04-20 DIAGNOSIS — M25561 Pain in right knee: Secondary | ICD-10-CM

## 2023-04-20 NOTE — Therapy (Addendum)
 OUTPATIENT PHYSICAL THERAPY Evaluation  Patient Name: Jonathan Garrison MRN: [id number] DOB:1945-10-29, 78 y.o., male Today's Date: 04/20/2023  END OF SESSION:  PT End of Session - 04/20/23 1217     Visit Number 1    Number of Visits 12    Date for PT Re-Evaluation 07/13/23    Authorization Type MCR    PT Start Time 1015    PT Stop Time 1100    PT Time Calculation (min) 45 min    Activity Tolerance Patient tolerated treatment well    Behavior During Therapy WFL for tasks assessed/performed                    Past Medical History:  Diagnosis Date   ADD (attention deficit disorder) without hyperactivity    Allergy    Anemia    no treatment   Anxiety    Arthralgia    many joints, managed with vitamins/herbs and ibuprofen   Arthritis    Cataract    Chest pain    Colitis 1986   single attack (believes was ulcerative)   Depression    Eczema    Esophageal stricture    GERD (gastroesophageal reflux disease)    Hemorrhoids    Lichen planus    Neuromuscular disorder (HCC)    POLIO AGE 80   Oral cancer (HCC) 05/2010   plans for surgery 06/2010   Osteoporosis    hips   Pleurisy 1984   Polio age 13   Umbilical hernia    Past Surgical History:  Procedure Laterality Date   COLONOSCOPY  2012   LEG SURGERY  when in 6th grade   L leg, bone spur removal (just above knee, laterally)   TONGUE SURGERY     for tongue cancer   UPPER GASTROINTESTINAL ENDOSCOPY     Patient Active Problem List   Diagnosis Date Noted   Hyperlipidemia 05/25/2022   Localized osteoarthritis of both shoulders 03/22/2022   Lower back pain 03/22/2022   Neck pain, chronic 03/22/2022   IT band syndrome 03/22/2022   Essential hypertension 02/22/2022   Family history of heart disease 02/22/2022   Trochanteric bursitis of left hip 08/01/2021   Pain of both hip joints 07/20/2021   Seasonal allergic rhinitis 05/24/2021   ETD (Eustachian tube dysfunction), bilateral 05/24/2021   Gastroesophageal  reflux disease without esophagitis 12/15/2017   Dysphagia 12/15/2017   History of oral cancer 12/15/2017   Increased prostate specific antigen (PSA) velocity 11/27/2017   Osteopenia 08/01/2017   Vitamin B12 deficiency anemia 06/09/2014   Chest pain 03/13/2013   Depression with anxiety 08/26/2011   Overweight (BMI 25.0-29.9) 08/26/2011   Joint pain 08/26/2011   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   Oral cancer (HCC) 07/09/2010    PCP: Kaylee Partridge, MD   REFERRING PROVIDER: Wilhelmenia Harada, MD  REFERRING DIAG:M25.561 (ICD-10-CM) - Acute pain of right knee   Rationale for Evaluation and Treatment: Rehabilitation  THERAPY DIAG:  Chronic pain of right knee - Plan: PT plan of care cert/re-cert  Pain in left hip - Plan: PT plan of care cert/re-cert  Difficulty in walking, not elsewhere classified - Plan: PT plan of care cert/re-cert  Muscle weakness (generalized) - Plan: PT plan of care cert/re-cert  ONSET DATE: chronic pain for years  SUBJECTIVE:  SUBJECTIVE STATEMENT: presents today with ongoing right knee pain that did act up again recently starting in febuary. Did have another injection as well and this helped some. Still also has some left hip pain and tightness as well. Stairs and walking long periods are his biggest complaints  PERTINENT HISTORY:  Polio as child, OA  PAIN:  Are you having pain? Yes: NPRS scale: 5-6/10 Pain location: back and hip, knee Pain description: ache Aggravating factors: stairs, worse first thing in the morning Relieving factors: meloxicam, voltaren  PRECAUTIONS: None  WEIGHT BEARING RESTRICTIONS: No  FALLS:  Has patient fallen in last 6 months? No  OCCUPATION: retired  PLOF: Independent  PATIENT GOALS: reduce pain,   NEXT MD VISIT:    OBJECTIVE:   DIAGNOSTIC FINDINGS:  Xray (right knee 4 views, left knee 4 views): Right knee with very mild degenerative findings particularly about the medial joint space  PATIENT SURVEYS:  Patient-Specific Activity Scoring Scheme  "0" represents "unable to perform." "10" represents "able to perform at prior level. 0 1 2 3 4 5 6 7 8 9  10 (Date and Score)   Activity Eval     1. Stairs up  5    2. Stairs down 5     3. Bending over to tie shoes 4   4.walking  5       Score 19/40    Total score = sum of the activity scores/number of activities Minimum detectable change (90%CI) for average score = 2 points Minimum detectable change (90%CI) for single activity score = 3 points     COGNITION: Overall cognitive status: Within functional limits for tasks assessed     SENSATION: WFL  MUSCLE LENGTH: Hamstrings: mildly tight at 80 deg bilat ITB tightness mildly on Rt  LOWER EXTREMITY MMT:    MMT Right eval Left eval  Hip flexion 5 5  Hip extension    Hip abduction 4 4  Hip adduction    Hip internal rotation    Hip external rotation    Knee flexion    Knee extension 49, 55 52# avg 51, 58 54.5  Ankle dorsiflexion    Ankle plantarflexion    Ankle inversion    Ankle eversion     (Blank rows = not tested)   LOWER EXTREMITY ROM:     Active  Right eval Left eval  Hip flexion    Hip extension    Hip abduction    Hip adduction    Hip internal rotation    Hip external rotation    Knee flexion 130 130  Knee extension 0 0  Ankle dorsiflexion    Ankle plantarflexion    Ankle inversion    Ankle eversion     (Blank rows = not tested)   GAIT: Eval: Comments: independent community Ambulator, 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  TODAY'S TREATMENT:  Therex: HEP creation and review with demonstration and trial set performed, see below for details.    PATIENT EDUCATION: Education details: HEP, PT plan of care Person educated: Patient Education method:  Explanation, Demonstration, Verbal cues, and Handouts Education comprehension: verbalized understanding and needs further education   HOME EXERCISE PROGRAM: Access Code: [email or web address] URL: [email or web address] Date: 04/20/2023 Prepared by: Jamee Mazzoni  Exercises - Seated Hamstring Stretch  - 1 x daily - 3 x weekly - 1 sets - 3 reps - 30 hold - Standing Quadriceps Stretch  - 1 x daily - 3 x weekly - 3 sets - 30 reps - Single-Leg [place]  Deadlift With Dumbbell  - 1 x daily - 3 x weekly - 2 sets - 10 reps - Dynamic Straight Leg Kicks  - 1 x daily - 6 x weekly - 2 sets - 10 reps - Standing Hamstring Curl with Resistance  - 2 x daily - 6 x weekly - 2 sets - 10 reps - Standing Hip Abduction with Resistance at Ankles and Counter Support  - 2 x daily - 6 x weekly - 2 sets - 10 reps  ASSESSMENT:  CLINICAL IMPRESSION: Pt referred to PT for evaluation and treatment of Right knee pain in the setting of mild osteophyte medially. He does also have complaints of left hip pain and tightness. He did get recent injection for this. MD recommending B/l quad and hip strengthening program. He will benefit from skilled PT to address his functional deficits and impairments listed below.  OBJECTIVE IMPAIRMENTS: decreased activity tolerance, difficulty walking, decreased balance, decreased endurance, decreased mobility, decreased ROM, decreased strength, impaired flexibility, impaired LE use, postural dysfunction, and pain.  ACTIVITY LIMITATIONS: bending, lifting, carry, locomotion, cleaning, community activity,  PERSONAL FACTORS: childhood polio, OA are also affecting patient's functional outcome.  REHAB POTENTIAL: Good  CLINICAL DECISION MAKING: Stable/uncomplicated  EVALUATION COMPLEXITY: Low    GOALS: Short term PT Goals Target date: 05/18/2023     Pt will be I and compliant with HEP. Baseline:  Goal status: New Pt will decrease pain by 25% overall Baseline: Goal status:  new  Long term PT goals Target date:07/13/23   Pt will improve  hip/knee strength to at least 5-/5 MMT to improve functional strength Baseline: Goal status: NEW Pt will improve PSFS to 37/40 or more to show improved function Baseline: Goal status: New Pt will reduce pain to overall less than 3/10 with usual activity and kayaking Baseline: Goal status: NEW 4. Pt will be able to hold SLS X 15 seconds best and 10 sec on avg for his Rt leg to show improved balance.  Baseline:  Goal status: NEW  PLAN: PT FREQUENCY: 1 time per 1-2 weeks  PT DURATION: 12 weeks  PLANNED INTERVENTIONS (unless contraindicated): aquatic PT, Canalith repositioning, cryotherapy, Electrical stimulation, Iontophoresis with 4 mg/ml dexamethasome, Moist heat, traction, Ultrasound, gait training, Therapeutic exercise, balance training, neuromuscular re-education, patient/family education, prosthetic training, manual techniques, passive ROM, dry needling, taping, vasopnuematic device, vestibular, spinal manipulations, joint manipulations  PLAN FOR NEXT SESSION: review HEP, did he get the email of them of give him print out he left.    Mick Alamin, PT,DPT 04/20/2023, 2:05 PM

## 2023-04-25 ENCOUNTER — Encounter: Payer: Self-pay | Admitting: Physical Therapy

## 2023-04-25 ENCOUNTER — Ambulatory Visit (INDEPENDENT_AMBULATORY_CARE_PROVIDER_SITE_OTHER): Admitting: Physical Therapy

## 2023-04-25 DIAGNOSIS — M6281 Muscle weakness (generalized): Secondary | ICD-10-CM

## 2023-04-25 DIAGNOSIS — G8929 Other chronic pain: Secondary | ICD-10-CM

## 2023-04-25 DIAGNOSIS — M25561 Pain in right knee: Secondary | ICD-10-CM | POA: Diagnosis not present

## 2023-04-25 DIAGNOSIS — R262 Difficulty in walking, not elsewhere classified: Secondary | ICD-10-CM

## 2023-04-25 DIAGNOSIS — M25552 Pain in left hip: Secondary | ICD-10-CM

## 2023-04-25 NOTE — Therapy (Addendum)
 OUTPATIENT PHYSICAL THERAPY TREATMENT  Patient Name: Jonathan Garrison MRN: [id number] DOB:1945-03-16, 78 y.o., male Today's Date: 04/25/2023  END OF SESSION:  PT End of Session - 04/25/23 1520     Visit Number 2    Number of Visits 12    Date for PT Re-Evaluation 07/13/23    Authorization Type MCR    PT Start Time 1345    PT Stop Time 1430    PT Time Calculation (min) 45 min    Activity Tolerance Patient tolerated treatment well    Behavior During Therapy WFL for tasks assessed/performed                    Past Medical History:  Diagnosis Date   ADD (attention deficit disorder) without hyperactivity    Allergy    Anemia    no treatment   Anxiety    Arthralgia    many joints, managed with vitamins/herbs and ibuprofen   Arthritis    Cataract    Chest pain    Colitis 1986   single attack (believes was ulcerative)   Depression    Eczema    Esophageal stricture    GERD (gastroesophageal reflux disease)    Hemorrhoids    Lichen planus    Neuromuscular disorder (HCC)    POLIO AGE 3   Oral cancer (HCC) 05/2010   plans for surgery 06/2010   Osteoporosis    hips   Pleurisy 1984   Polio age 70   Umbilical hernia    Past Surgical History:  Procedure Laterality Date   COLONOSCOPY  2012   LEG SURGERY  when in 6th grade   L leg, bone spur removal (just above knee, laterally)   TONGUE SURGERY     for tongue cancer   UPPER GASTROINTESTINAL ENDOSCOPY     Patient Active Problem List   Diagnosis Date Noted   Hyperlipidemia 05/25/2022   Localized osteoarthritis of both shoulders 03/22/2022   Lower back pain 03/22/2022   Neck pain, chronic 03/22/2022   IT band syndrome 03/22/2022   Essential hypertension 02/22/2022   Family history of heart disease 02/22/2022   Trochanteric bursitis of left hip 08/01/2021   Pain of both hip joints 07/20/2021   Seasonal allergic rhinitis 05/24/2021   ETD (Eustachian tube dysfunction), bilateral 05/24/2021   Gastroesophageal  reflux disease without esophagitis 12/15/2017   Dysphagia 12/15/2017   History of oral cancer 12/15/2017   Increased prostate specific antigen (PSA) velocity 11/27/2017   Osteopenia 08/01/2017   Vitamin B12 deficiency anemia 06/09/2014   Chest pain 03/13/2013   Depression with anxiety 08/26/2011   Overweight (BMI 25.0-29.9) 08/26/2011   Joint pain 08/26/2011   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   Oral cancer (HCC) 07/09/2010    PCP: Kaylee Partridge, MD   REFERRING PROVIDER: Wilhelmenia Harada, MD  REFERRING DIAG:M25.561 (ICD-10-CM) - Acute pain of right knee   Rationale for Evaluation and Treatment: Rehabilitation  THERAPY DIAG:  Chronic pain of right knee  Pain in left hip  Difficulty in walking, not elsewhere classified  Muscle weakness (generalized)  ONSET DATE: chronic pain for years  SUBJECTIVE:  SUBJECTIVE STATEMENT: He says doing well overall, he was able to lift heavy kayak. He does endorse some left knee pain at times today. He can tell his right leg is weaker.   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 Yes: NPRS scale: 5-6/10 Pain location: back and hip, knee Pain description: ache Aggravating factors: stairs, worse first thing in the morning Relieving factors: meloxicam, voltaren  PRECAUTIONS: None  WEIGHT BEARING RESTRICTIONS: No  FALLS:  Has patient fallen in last 6 months? No  OCCUPATION: retired  PLOF: Independent  PATIENT GOALS: reduce pain,   NEXT MD VISIT:   OBJECTIVE:   DIAGNOSTIC FINDINGS:  Xray (right knee 4 views, left knee 4 views): Right knee with very mild degenerative findings particularly about the medial joint space  PATIENT SURVEYS:  Patient-Specific Activity Scoring Scheme  "0" represents "unable to  perform." "10" represents "able to perform at prior level. 0 1 2 3 4 5 6 7 8 9  10 (Date and Score)   Activity Eval     1. Stairs up  5    2. Stairs down 5     3. Bending over to tie shoes 4   4.walking  5       Score 19/40    Total score = sum of the activity scores/number of activities Minimum detectable change (90%CI) for average score = 2 points Minimum detectable change (90%CI) for single activity score = 3 points     COGNITION: Overall cognitive status: Within functional limits for tasks assessed     SENSATION: WFL  MUSCLE LENGTH: Hamstrings: mildly tight at 80 deg bilat ITB tightness mildly on Rt  LOWER EXTREMITY MMT:    MMT Right eval Left eval  Hip flexion 5 5  Hip extension    Hip abduction 4 4  Hip adduction    Hip internal rotation    Hip external rotation    Knee flexion    Knee extension 49, 55 52# avg 51, 58 54.5  Ankle dorsiflexion    Ankle plantarflexion    Ankle inversion    Ankle eversion     (Blank rows = not tested)   LOWER EXTREMITY ROM:     Active  Right eval Left eval  Hip flexion    Hip extension    Hip abduction    Hip adduction    Hip internal rotation    Hip external rotation    Knee flexion 130 130  Knee extension 0 0  Ankle dorsiflexion    Ankle plantarflexion    Ankle inversion    Ankle eversion     (Blank rows = not tested)   GAIT: Eval: Comments: independent community Ambulator, 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  TODAY'S TREATMENT:  04/25/23 Therex: HEP review with demonstration education provided Nu step L5, seat # 13 X 10 min UE/LE Standing dynamic hamstring stretching and hip flexor stretching kicks X 10 each bilat  Theractivity SL deadlift 10# 2X12 bilat TRX squats 2X12 bilat Sit to stands no UE support X 12 Standing hamstring curls green 2X12 bilat Standing hip extensions green 2X12 bilat Standing hip abductions green 3X12 on Rt, 2X12 on left    PATIENT EDUCATION: Education details: HEP, PT plan  of care Person educated: Patient Education method: Explanation, Demonstration, Verbal cues, and Handouts Education comprehension: verbalized understanding and needs further education   HOME EXERCISE PROGRAM: Access Code: [email or web address] URL: [email or web address] Date: 04/20/2023 Prepared by: Jamee Mazzoni  Exercises - Seated  Hamstring Stretch  - 1 x daily - 3 x weekly - 1 sets - 3 reps - 30 hold - Standing Quadriceps Stretch  - 1 x daily - 3 x weekly - 3 sets - 30 reps - Single-Leg [place] Deadlift With Dumbbell  - 1 x daily - 3 x weekly - 2 sets - 10 reps - Dynamic Straight Leg Kicks  - 1 x daily - 6 x weekly - 2 sets - 10 reps - Standing Hamstring Curl with Resistance  - 2 x daily - 6 x weekly - 2 sets - 10 reps - Standing Hip Abduction with Resistance at Ankles and Counter Support  - 2 x daily - 6 x weekly - 2 sets - 10 reps  ASSESSMENT:  CLINICAL IMPRESSION: Session focused on HEP review with minor cuing needed for proper form. He was then able to show good return demonstration. PT recommending to continue with current plan.  OBJECTIVE IMPAIRMENTS: decreased activity tolerance, difficulty walking, decreased balance, decreased endurance, decreased mobility, decreased ROM, decreased strength, impaired flexibility, impaired LE use, postural dysfunction, and pain.  ACTIVITY LIMITATIONS: bending, lifting, carry, locomotion, cleaning, community activity,  PERSONAL FACTORS: childhood polio, OA are also affecting patient's functional outcome.  REHAB POTENTIAL: Good  CLINICAL DECISION MAKING: Stable/uncomplicated  EVALUATION COMPLEXITY: Low    GOALS: Short term PT Goals Target date: 05/18/2023     Pt will be I and compliant with HEP. Baseline:  Goal status: New Pt will decrease pain by 25% overall Baseline: Goal status: new  Long term PT goals Target date:07/13/23   Pt will improve  hip/knee strength to at least 5-/5 MMT to improve functional  strength Baseline: Goal status: NEW Pt will improve PSFS to 37/40 or more to show improved function Baseline: Goal status: New Pt will reduce pain to overall less than 3/10 with usual activity and kayaking Baseline: Goal status: NEW 4. Pt will be able to hold SLS X 15 seconds best and 10 sec on avg for his Rt leg to show improved balance.  Baseline:  Goal status: NEW  PLAN: PT FREQUENCY: 1 time per 1-2 weeks  PT DURATION: 12 weeks  PLANNED INTERVENTIONS (unless contraindicated): aquatic PT, Canalith repositioning, cryotherapy, Electrical stimulation, Iontophoresis with 4 mg/ml dexamethasome, Moist heat, traction, Ultrasound, gait training, Therapeutic exercise, balance training, neuromuscular re-education, patient/family education, prosthetic training, manual techniques, passive ROM, dry needling, taping, vasopnuematic device, vestibular, spinal manipulations, joint manipulations  PLAN FOR NEXT SESSION: hip/knee strength focus.  Mick Alamin, PT,DPT 04/25/2023, 3:22 PM

## 2023-05-03 ENCOUNTER — Ambulatory Visit (INDEPENDENT_AMBULATORY_CARE_PROVIDER_SITE_OTHER)

## 2023-05-03 ENCOUNTER — Encounter: Payer: Self-pay | Admitting: Podiatry

## 2023-05-03 ENCOUNTER — Ambulatory Visit (INDEPENDENT_AMBULATORY_CARE_PROVIDER_SITE_OTHER): Admitting: Podiatry

## 2023-05-03 DIAGNOSIS — M79671 Pain in right foot: Secondary | ICD-10-CM | POA: Diagnosis not present

## 2023-05-03 DIAGNOSIS — M2041 Other hammer toe(s) (acquired), right foot: Secondary | ICD-10-CM

## 2023-05-03 NOTE — Progress Notes (Signed)
   Chief Complaint  Patient presents with   Foot Pain    RM#9 right foot pain ongoing issue is worsening is currently in physical therapy.Needs advice on maintenance .    HPI: 78 y.o. male presenting today for evaluation of instability of gait when ambulating.  History of falls.  He would like to discuss possible shoe gear and arch supports to help with stability.  Past Medical History:  Diagnosis Date   ADD (attention deficit disorder) without hyperactivity    Allergy    Anemia    no treatment   Anxiety    Arthralgia    many joints, managed with vitamins/herbs and ibuprofen   Arthritis    Cataract    Chest pain    Colitis 1986   single attack (believes was ulcerative)   Depression    Eczema    Esophageal stricture    GERD (gastroesophageal reflux disease)    Hemorrhoids    Lichen planus    Neuromuscular disorder (HCC)    POLIO AGE 78   Oral cancer (HCC) 05/2010   plans for surgery 06/2010   Osteoporosis    hips   Pleurisy 1984   Polio age 78   Umbilical hernia     Past Surgical History:  Procedure Laterality Date   COLONOSCOPY  2012   LEG SURGERY  when in 6th grade   L leg, bone spur removal (just above knee, laterally)   TONGUE SURGERY     for tongue cancer   UPPER GASTROINTESTINAL ENDOSCOPY      No Known Allergies   Physical Exam: General: The patient is alert and oriented x3 in no acute distress.  Dermatology: Skin is warm, dry and supple bilateral lower extremities.   Vascular: Palpable pedal pulses bilaterally. Capillary refill within normal limits.  No appreciable edema.  No erythema.  Neurological: Grossly intact via light touch  Musculoskeletal Exam: Hammertoe contracture noted to the lesser digits of the right foot.  Asymptomatic.  No tenderness with palpation.  Muscle strength 5/5 all compartments  Radiographic Exam RT foot 05/03/2023:  Normal osseous mineralization. Joint spaces preserved.  Hammertoe contracture noted to the lesser digits of the  right foot  Assessment/Plan of Care: 1.  Instability of gait with history of falls 2.  Hammertoes lesser digits right foot -Patient evaluated -Recommend good stability shoes and sneakers.  Recommended Orthofeet walking shoes -We did discuss the possibility of balance braces however after discussing with the patient he would rather not use the balance bracing -Return to clinic as needed      Felecia Shelling, DPM [facility]  Dr. Felecia Shelling, DPM    [address] [place], [place] [address]                Office [phone]  Fax [phone]

## 2023-05-04 DIAGNOSIS — F410 Panic disorder [episodic paroxysmal anxiety] without agoraphobia: Secondary | ICD-10-CM | POA: Diagnosis not present

## 2023-05-04 DIAGNOSIS — F422 Mixed obsessional thoughts and acts: Secondary | ICD-10-CM | POA: Diagnosis not present

## 2023-05-11 ENCOUNTER — Ambulatory Visit (INDEPENDENT_AMBULATORY_CARE_PROVIDER_SITE_OTHER): Admitting: Physical Therapy

## 2023-05-11 ENCOUNTER — Encounter: Payer: Self-pay | Admitting: Physical Therapy

## 2023-05-11 DIAGNOSIS — M25561 Pain in right knee: Secondary | ICD-10-CM

## 2023-05-11 DIAGNOSIS — M25552 Pain in left hip: Secondary | ICD-10-CM | POA: Diagnosis not present

## 2023-05-11 DIAGNOSIS — R262 Difficulty in walking, not elsewhere classified: Secondary | ICD-10-CM

## 2023-05-11 DIAGNOSIS — G8929 Other chronic pain: Secondary | ICD-10-CM

## 2023-05-11 DIAGNOSIS — M6281 Muscle weakness (generalized): Secondary | ICD-10-CM

## 2023-05-11 NOTE — Therapy (Signed)
 OUTPATIENT PHYSICAL THERAPY TREATMENT  Patient Name: Jonathan Garrison MRN: [id number] DOB:Dec 07, 1945, 78 y.o., male Today's Date: 05/11/2023  END OF SESSION:  PT End of Session - 05/11/23 1522     Visit Number 3    Number of Visits 12    Date for PT Re-Evaluation 07/13/23    Authorization Type MCR    PT Start Time 1430    PT Stop Time 1515    PT Time Calculation (min) 45 min    Activity Tolerance Patient tolerated treatment well    Behavior During Therapy WFL for tasks assessed/performed                     Past Medical History:  Diagnosis Date   ADD (attention deficit disorder) without hyperactivity    Allergy    Anemia    no treatment   Anxiety    Arthralgia    many joints, managed with vitamins/herbs and ibuprofen   Arthritis    Cataract    Chest pain    Colitis 1986   single attack (believes was ulcerative)   Depression    Eczema    Esophageal stricture    GERD (gastroesophageal reflux disease)    Hemorrhoids    Lichen planus    Neuromuscular disorder (HCC)    POLIO AGE 6   Oral cancer (HCC) 05/2010   plans for surgery 06/2010   Osteoporosis    hips   Pleurisy 1984   Polio age 5   Umbilical hernia    Past Surgical History:  Procedure Laterality Date   COLONOSCOPY  2012   LEG SURGERY  when in 6th grade   L leg, bone spur removal (just above knee, laterally)   TONGUE SURGERY     for tongue cancer   UPPER GASTROINTESTINAL ENDOSCOPY     Patient Active Problem List   Diagnosis Date Noted   Hyperlipidemia 05/25/2022   Localized osteoarthritis of both shoulders 03/22/2022   Lower back pain 03/22/2022   Neck pain, chronic 03/22/2022   IT band syndrome 03/22/2022   Essential hypertension 02/22/2022   Family history of heart disease 02/22/2022   Trochanteric bursitis of left hip 08/01/2021   Pain of both hip joints 07/20/2021   Seasonal allergic rhinitis 05/24/2021   ETD (Eustachian tube dysfunction), bilateral 05/24/2021   Gastroesophageal  reflux disease without esophagitis 12/15/2017   Dysphagia 12/15/2017   History of oral cancer 12/15/2017   Increased prostate specific antigen (PSA) velocity 11/27/2017   Osteopenia 08/01/2017   Vitamin B12 deficiency anemia 06/09/2014   Chest pain 03/13/2013   Depression with anxiety 08/26/2011   Overweight (BMI 25.0-29.9) 08/26/2011   Joint pain 08/26/2011   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   Oral cancer (HCC) 07/09/2010    PCP: Kaylee Partridge, MD   REFERRING PROVIDER: Wilhelmenia Harada, MD  REFERRING DIAG:M25.561 (ICD-10-CM) - Acute pain of right knee   Rationale for Evaluation and Treatment: Rehabilitation  THERAPY DIAG:  Chronic pain of right knee  Pain in left hip  Difficulty in walking, not elsewhere classified  Muscle weakness (generalized)  ONSET DATE: chronic pain for years  SUBJECTIVE:  SUBJECTIVE STATEMENT: He did a Engineer, agricultural trip and he is having some pain afterwards in right hip today. His right knee pain has improved.     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 Yes: NPRS scale: 5/10 Pain location: back and hip, knee Pain description: ache Aggravating factors: stairs, worse first thing in the morning Relieving factors: meloxicam, voltaren  PRECAUTIONS: None  WEIGHT BEARING RESTRICTIONS: No  FALLS:  Has patient fallen in last 6 months? No  OCCUPATION: retired  PLOF: Independent  PATIENT GOALS: reduce pain,   NEXT MD VISIT:   OBJECTIVE:   DIAGNOSTIC FINDINGS:  Xray (right knee 4 views, left knee 4 views): Right knee with very mild degenerative findings particularly about the medial joint space  PATIENT SURVEYS:  Patient-Specific Activity Scoring Scheme  "0" represents "unable to perform." "10" represents  "able to perform at prior level. 0 1 2 3 4 5 6 7 8 9  10 (Date and Score)   Activity Eval     1. Stairs up  5    2. Stairs down 5     3. Bending over to tie shoes 4   4.walking  5       Score 19/40    Total score = sum of the activity scores/number of activities Minimum detectable change (90%CI) for average score = 2 points Minimum detectable change (90%CI) for single activity score = 3 points     COGNITION: Overall cognitive status: Within functional limits for tasks assessed     SENSATION: WFL  MUSCLE LENGTH: Hamstrings: mildly tight at 80 deg bilat ITB tightness mildly on Rt  LOWER EXTREMITY MMT:    MMT Right eval Left eval  Hip flexion 5 5  Hip extension    Hip abduction 4 4  Hip adduction    Hip internal rotation    Hip external rotation    Knee flexion    Knee extension 49, 55 52# avg 51, 58 54.5  Ankle dorsiflexion    Ankle plantarflexion    Ankle inversion    Ankle eversion     (Blank rows = not tested)   LOWER EXTREMITY ROM:     Active  Right eval Left eval  Hip flexion    Hip extension    Hip abduction    Hip adduction    Hip internal rotation    Hip external rotation    Knee flexion 130 130  Knee extension 0 0  Ankle dorsiflexion    Ankle plantarflexion    Ankle inversion    Ankle eversion     (Blank rows = not tested)   GAIT: Eval: Comments: independent community Ambulator, 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  TODAY'S TREATMENT:  05/11/23 Therex: Nu step L5, seat # 12 X 8 min UE/LE Supine hamstring stretch 30 sec X 3 bilat Supine bilat straight leg lifts for core, lifting legs 6 inches, X 12 vertical, X 12 horizontal abd/add X 12  Theractivity Leg press DL 16# 1W96, SL 04# 5W09 each side SL deadlift was attempted but too difficult to coordinate today as well as cramping in his hamstrings so modified this to double leg deadlift 15# X 12 reps Standing hamstring curls green 2X12 bilat Standing hip extensions green 2X12  bilat Standing hip abductions green 3X12 on Rt, 2X12 on left    PATIENT EDUCATION: Education details: HEP, PT plan of care Person educated: Patient Education method: Explanation, Demonstration, Verbal cues, and Handouts Education comprehension: verbalized  understanding and needs further education   HOME EXERCISE PROGRAM: Access Code: [email or web address] URL: [email or web address] Date: 04/20/2023 Prepared by: Jamee Mazzoni  Exercises - Seated Hamstring Stretch  - 1 x daily - 3 x weekly - 1 sets - 3 reps - 30 hold - Standing Quadriceps Stretch  - 1 x daily - 3 x weekly - 3 sets - 30 reps - Single-Leg [place] Deadlift With Dumbbell  - 1 x daily - 3 x weekly - 2 sets - 10 reps - Dynamic Straight Leg Kicks  - 1 x daily - 6 x weekly - 2 sets - 10 reps - Standing Hamstring Curl with Resistance  - 2 x daily - 6 x weekly - 2 sets - 10 reps - Standing Hip Abduction with Resistance at Ankles and Counter Support  - 2 x daily - 6 x weekly - 2 sets - 10 reps  ASSESSMENT:  CLINICAL IMPRESSION: He had a lot of difficulty performing single leg deadlift today so we needed to modify this to double leg deadlift and he was able to do this with proper form and coordination so he will make this change at home with HEP. Some soreness today after kayak trip but responded well to exercise program today. PT recommending to continue with current plan.   OBJECTIVE IMPAIRMENTS: decreased activity tolerance, difficulty walking, decreased balance, decreased endurance, decreased mobility, decreased ROM, decreased strength, impaired flexibility, impaired LE use, postural dysfunction, and pain.  ACTIVITY LIMITATIONS: bending, lifting, carry, locomotion, cleaning, community activity,  PERSONAL FACTORS: childhood polio, OA are also affecting patient's functional outcome.  REHAB POTENTIAL: Good  CLINICAL DECISION MAKING: Stable/uncomplicated  EVALUATION COMPLEXITY: Low    GOALS: Short term PT Goals Target  date: 05/18/2023     Pt will be I and compliant with HEP. Baseline:  Goal status: MET 05/11/23 Pt will decrease pain by 25% overall Baseline: Goal status: Met 05/11/23  Long term PT goals Target date:07/13/23   Pt will improve  hip/knee strength to at least 5-/5 MMT to improve functional strength Baseline: Goal status: ongoing 05/11/23 Pt will improve PSFS to 37/40 or more to show improved function Baseline: Goal status: ongoing 05/11/23 Pt will reduce pain to overall less than 3/10 with usual activity and kayaking Baseline: Goal status: ongoing 05/11/23 4. Pt will be able to hold SLS X 15 seconds best and 10 sec on avg for his Rt leg to show improved balance.  Baseline:  Goal status: ongoing 05/11/23  PLAN: PT FREQUENCY: 1 time per 1-2 weeks  PT DURATION: 12 weeks  PLANNED INTERVENTIONS (unless contraindicated): aquatic PT, Canalith repositioning, cryotherapy, Electrical stimulation, Iontophoresis with 4 mg/ml dexamethasome, Moist heat, traction, Ultrasound, gait training, Therapeutic exercise, balance training, neuromuscular re-education, patient/family education, prosthetic training, manual techniques, passive ROM, dry needling, taping, vasopnuematic device, vestibular, spinal manipulations, joint manipulations  PLAN FOR NEXT SESSION: hip/knee strength focus.  Mick Alamin, PT,DPT 05/11/2023, 3:23 PM

## 2023-05-16 ENCOUNTER — Ambulatory Visit (AMBULATORY_SURGERY_CENTER): Admitting: Internal Medicine

## 2023-05-16 ENCOUNTER — Encounter: Payer: Self-pay | Admitting: Internal Medicine

## 2023-05-16 VITALS — BP 124/71 | HR 83 | Temp 98.3°F | Resp 11 | Ht 74.0 in | Wt 196.0 lb

## 2023-05-16 DIAGNOSIS — K449 Diaphragmatic hernia without obstruction or gangrene: Secondary | ICD-10-CM

## 2023-05-16 DIAGNOSIS — R1310 Dysphagia, unspecified: Secondary | ICD-10-CM | POA: Diagnosis not present

## 2023-05-16 DIAGNOSIS — R1319 Other dysphagia: Secondary | ICD-10-CM

## 2023-05-16 DIAGNOSIS — F419 Anxiety disorder, unspecified: Secondary | ICD-10-CM | POA: Diagnosis not present

## 2023-05-16 DIAGNOSIS — K225 Diverticulum of esophagus, acquired: Secondary | ICD-10-CM

## 2023-05-16 DIAGNOSIS — K222 Esophageal obstruction: Secondary | ICD-10-CM | POA: Diagnosis not present

## 2023-05-16 DIAGNOSIS — K219 Gastro-esophageal reflux disease without esophagitis: Secondary | ICD-10-CM

## 2023-05-16 DIAGNOSIS — F32A Depression, unspecified: Secondary | ICD-10-CM | POA: Diagnosis not present

## 2023-05-16 MED ORDER — SODIUM CHLORIDE 0.9 % IV SOLN: 500.0000 mL | Freq: Once | INTRAVENOUS | Status: DC

## 2023-05-16 NOTE — Progress Notes (Signed)
 Pt's states no medical or surgical changes since previsit or office visit.  Pt reports post nasal drip ongoing for 4 months.   Pt reports feeling "like a pocket in throat" and food gets stuck in there sometimes. Pt reports can bring up food at times.   Also reports large pills get stuck at times.

## 2023-05-16 NOTE — Progress Notes (Signed)
 Called to room to assist during endoscopic procedure.  Patient ID and intended procedure confirmed with present staff. Received instructions for my participation in the procedure from the performing physician.

## 2023-05-16 NOTE — Op Note (Signed)
 [facility] Patient Name: Jonathan Garrison Procedure Date: 05/16/2023 2:03 PM MRN: [id number] Endoscopist: Murel Arlington. Elvin Hammer , MD, [id number] Age: 78 Referring MD:  Date of Birth: [birth year] Gender: Male Account #: [id number] Procedure:                Upper GI endoscopy with balloon dilation of the                            esophagus Indications:              Therapeutic procedure, Dysphagia, Esophageal reflux Medicines:                Monitored Anesthesia Care Procedure:                Pre-Anesthesia Assessment:                           - Prior to the procedure, a History and Physical                            was performed, and patient medications and                            allergies were reviewed. The patient's tolerance of                            previous anesthesia was also reviewed. The risks                            and benefits of the procedure and the sedation                            options and risks were discussed with the patient.                            All questions were answered, and informed consent                            was obtained. Prior Anticoagulants: The patient has                            taken no anticoagulant or antiplatelet agents. ASA                            Grade Assessment: II - A patient with mild systemic                            disease. After reviewing the risks and benefits,                            the patient was deemed in satisfactory condition to                            undergo the procedure.  After obtaining informed consent, the endoscope was                            passed under direct vision. Throughout the                            procedure, the patient's blood pressure, pulse, and                            oxygen saturations were monitored continuously. The                            GIF [id number] #[id number] was introduced through the                            mouth, and  advanced to the second part of duodenum.                            The upper GI endoscopy was accomplished without                            difficulty. The patient tolerated the procedure                            well. Scope In: Scope Out: Findings:                 The posterior pharynx revealed a Zenker's                            diverticulum without food.                           The esophagus revealed scarring and mild                            stricturing in the distalmost portion of. A TTS                            dilator was passed through the scope. Dilation with                            an 18-19-20 mm balloon dilator was performed to 20                            mm. No mucosal disruption.                           The stomach revealed a moderate hiatal hernia but                            was otherwise normal.                           The examined duodenum was normal.  The cardia and gastric fundus were normal on                            retroflexion, save hiatal hernia. Complications:            No immediate complications. Estimated Blood Loss:     Estimated blood loss: none. Impression:               1. Zenker's diverticulum                           2. GERD. No active esophagitis.                           3. Distal esophageal stricture. Dilated.                           4. Moderately large hiatal hernia. Otherwise                            unremarkable EGD Recommendation:           1. Patient has a contact number available for                            emergencies. The signs and symptoms of potential                            delayed complications were discussed with the                            patient. Return to normal activities tomorrow.                            Written discharge instructions were provided to the                            patient.                           2. Post dilation diet.                            3. Continue present medications.                           4. Please schedule barium swallow. "Zenker's                            diverticulum, evaluate"                           5. Office follow-up with Dr. Elvin Hammer in about 6 weeks Murel Arlington. Elvin Hammer, MD 05/16/2023 2:30:59 PM This report has been signed electronically.

## 2023-05-16 NOTE — Progress Notes (Signed)
 Expand All Collapse All        04/19/2023 Dray Dente [id number] 1945/03/25   Referring provider: Kaylee Partridge, MD Primary GI doctor: Dr. Elvin Hammer   ASSESSMENT AND PLAN:    Dysphagia with history of erosive esophagitis and GERD EGD May 07, 2021 status post dilation 20 mm of significant stricture distal esophagus Peptic stricturing requiring esophageal dilation Progressive dysphagia x 6 months worse with pills, food, can have regurgitation 02/2023 no anemia Omeprazole  once or twice a day, increase to twice a day Mobic  daily x 6-8 years, 1-2 beers daily, remote smoking, - left sublingual cancer s/p excision/ablation but follows every 6 months -Schedule EGD with dilatation to evaluate for stenosis, tumor, erosive/infectious esophagititis, and EOE.   If the EGD is negative and symptoms continue after PPI trial, can consider barium swallow  I discussed risks of EGD with patient today, including risk of sedation, bleeding or perforation.  -Patient provides understanding and gave verbal consent to proceed. -In the interim patient advised about swallowing precautions.  -Eat slowly, chew food well before swallowing.  -Drink liquids in between each bite to avoid food impaction. - need to try to stop ETOH and stop/cut back on mobic  - increase omeprazole  to twice a day   Screening colonoscopy 05/14/2020 colonoscopy Dr. Elvin Hammer internal hemorrhoids otherwise unremarkable no recall screening secondary to age     Patient Care Team: Copland, Skipper Dumas, MD as PCP - General (Family Medicine) Tobin Forts, MD (Gastroenterology) Virl Grimes, MD (Orthopedic Surgery)   HISTORY OF PRESENT ILLNESS: 78 y.o. male with a past medical history listed below, known to Dr. Elvin Hammer presents with dysphagia.   Discussed the use of AI scribe software for clinical note transcription with the patient, who gave verbal consent to proceed.   History of Present Illness   Hasheem Voland "Raenette Bumps" is a 78 year old  male with esophageal strictures who presents with worsening swallowing difficulties.   He has a history of esophageal strictures, with the last dilation performed on May 07, 2021, where the stricture in the distal esophagus was dilated to 20 millimeters. Post-procedure, his swallowing improved, but symptoms have progressively worsened over the past six months. He experiences difficulty swallowing large pills, such as fish oil and glucosamine chondroitin, which sometimes get stuck and require food to help them pass. There is a sensation of food accumulating, particularly with certain textures like protein bars, which can cause gagging and require regurgitation to clear.   He is currently taking Prilosec (omeprazole ) once daily, occasionally twice if experiencing increased heartburn, and Mobic  (meloxicam ) every morning for arthritis and osteopenia. He manages his symptoms by timing his pill intake with meals and avoiding certain foods like regular beef, opting for minced options like hamburgers instead. A burning sensation in the back of his throat occurs when consuming very sweet foods, leading him to avoid cookies and limit protein bars.   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 He attends regular six-month check-ups and reports being in good condition.   No weight loss, dark stools, or significant changes in bowel habits, although he notes firmer stools recently, possibly due to increased physical activity like kayaking. He consumes alcohol moderately, typically one to two beers daily, and occasionally wine or whiskey. He has a history of smoking in his twenties but has since quit.       He  reports that he quit smoking about 47 years ago. His smoking use included pipe and cigarettes. He has never used smokeless tobacco. He reports current alcohol  use of about 7.0 - 14.0 standard drinks of alcohol per week. He reports that he does not use drugs.   RELEVANT GI HISTORY, IMAGING AND LABS: Results   DIAGNOSTIC Endoscopy: Stricture in distal esophagus, dilated to 20 mm; stomach normal (05/07/2021) Stress test: Normal   PATHOLOGY Oral biopsy: Squamous cell carcinoma, sublingual left     12/2022 Echo Vigorous normal LVF G1DD. Mod TR, mild MR.    CBC Labs (Brief)          Component Value Date/Time    WBC 5.1 03/13/2023 1402    RBC 4.27 03/13/2023 1402    HGB 13.4 03/13/2023 1402    HGB 13.7 06/06/2014 1406    HCT 40.1 03/13/2023 1402    HCT 40.0 06/06/2014 1406    PLT 239.0 03/13/2023 1402    PLT 250 06/06/2014 1406    MCV 93.9 03/13/2023 1402    MCV 90 06/06/2014 1406    MCH 29.8 09/22/2017 1515    MCHC 33.3 03/13/2023 1402    RDW 14.0 03/13/2023 1402    RDW 13.5 06/06/2014 1406    LYMPHSABS 1,350 06/18/2015 1018    LYMPHSABS 1.3 06/06/2014 1406    MONOABS 450 06/18/2015 1018    EOSABS 540 (H) 06/18/2015 1018    EOSABS 0.3 06/06/2014 1406    BASOSABS 45 06/18/2015 1018    BASOSABS 0.0 06/06/2014 1406      Recent Labs (within last 365 days)      Recent Labs    06/01/22 1030 03/13/23 1402  HGB 13.6 13.4        CMP     Labs (Brief)          Component Value Date/Time    NA 141 03/13/2023 1402    K 4.5 03/13/2023 1402    CL 105 03/13/2023 1402    CO2 28 03/13/2023 1402    GLUCOSE 86 03/13/2023 1402    BUN 28 (H) 03/13/2023 1402    CREATININE 0.87 03/13/2023 1402    CREATININE 0.86 10/03/2019 0839    CALCIUM  9.5 03/13/2023 1402    PROT 6.5 03/13/2023 1402    ALBUMIN 4.3 03/13/2023 1402    AST 33 03/13/2023 1402    ALT 28 03/13/2023 1402    ALKPHOS 53 03/13/2023 1402    BILITOT 1.0 03/13/2023 1402    GFRNONAA 89 06/18/2015 1018    GFRAA >89 06/18/2015 1018          Latest Ref Rng & Units 03/13/2023    2:02 PM 03/23/2022   11:13 AM 06/09/2021    9:57 AM  Hepatic Function  Total Protein 6.0 - 8.3  g/dL 6.5  6.3  6.6   Albumin 3.5 - 5.2 g/dL 4.3  3.9  4.3   AST 0 - 37 U/L 33  29  26   ALT 0 - 53 U/L 28  24  23    Alk Phosphatase 39 - 117 U/L 53  61  67   Total Bilirubin 0.2 - 1.2 mg/dL 1.0  0.6  0.9       Current Medications:      Current Outpatient Medications (Cardiovascular):    losartan  (COZAAR ) 100 MG tablet, TAKE 1 TABLET(100 MG) BY MOUTH DAILY   rosuvastatin  (CRESTOR ) 20 MG tablet, TAKE 1 TABLET(20 MG) BY MOUTH DAILY   sildenafil  (REVATIO ) 20 MG  tablet, Take 2-3 tablets one hour prior to intercourse   Current Outpatient Medications (Respiratory):    fluticasone  (FLONASE ) 50 MCG/ACT nasal spray, Place 2 sprays into both nostrils daily.   loratadine (CLARITIN) 10 MG tablet, Take 10 mg by mouth daily.   Current Outpatient Medications (Analgesics):    aspirin 81 MG tablet, Take 81 mg by mouth daily.   meloxicam  (MOBIC ) 15 MG tablet, TAKE 1 TABLET(15 MG) BY MOUTH DAILY AS NEEDED   Current Outpatient Medications (Hematological):    ferrous sulfate  324 (65 Fe) MG TBEC, Take one every other day for low iron   Current Outpatient Medications (Other):    amphetamine -dextroamphetamine  (ADDERALL) 20 MG tablet, Take 1 tablet (20 mg total) by mouth 2 (two) times daily.   b complex vitamins tablet, Take 0.5 tablets by mouth daily.   Biotin 5000 MCG CAPS, Take 1 capsule by mouth daily.   Calcium -Magnesium-Zinc 167-83-8 MG TABS, Take by mouth. 1 tablet daily   Cholecalciferol (VITAMIN D3) 2000 UNITS capsule, Take 2,000 Units by mouth daily.   glucosamine-chondroitin 500-400 MG tablet, Take 1 tablet by mouth 2 (two) times daily.   Multiple Vitamins-Minerals (MULTIVITAMIN WITH MINERALS) tablet, Take 1 tablet by mouth daily.   NON FORMULARY, ALJ -herbal decongestant  Takes as needed   omeprazole  (PRILOSEC) 40 MG capsule, Take 1 capsule (40 mg total) by mouth in the morning and at bedtime.   Zn-Pyg Afri-Nettle-Saw Palmet (SAW PALMETTO COMPLEX PO), Take 1 tablet by mouth 2 (two) times  daily.   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 Family History:  His family history includes Benign prostatic hyperplasia in his father; Cancer in his sister; Diabetes in his maternal grandfather and mother; Heart disease in his father; Hyperlipidemia in his mother; Hypertension in his father and mother; Lichen planus in his sister.   REVIEW OF SYSTEMS  : All other systems reviewed and negative except where noted in the History of Present Illness.   PHYSICAL EXAM: BP (!) 146/82   Pulse 80   Ht 6\' 2"  (1.88 m)   Wt 196 lb (88.9 kg)   BMI 25.16 kg/m  Physical Exam   GENERAL APPEARANCE: Well nourished, in no apparent distress. HEENT: No cervical lymphadenopathy, unremarkable thyroid , sclerae anicteric, conjunctiva pink. RESPIRATORY: Respiratory effort normal,  breath sounds clear to auscultation bilaterally without rales, rhonchi, or wheezing. CARDIO: Regular rate and rhythm with no murmurs, rubs, or gallops, peripheral pulses intact. ABDOMEN: Soft, non-distended, active bowel sounds in all four quadrants, non-tender, no epigastric or lower  abdominal pain, no rebound, no mass appreciated. RECTAL: Declines. MUSCULOSKELETAL: Full range of motion, normal gait, without edema. SKIN: Dry, intact without rashes or lesions. No jaundice. NEURO: Alert, oriented, no focal deficits. PSYCH: Cooperative, normal mood and affect.       Edmonia Gottron, PA-C 1:49 PM

## 2023-05-16 NOTE — Patient Instructions (Signed)
 Handout provided about GERD, esophageal stricture and hiatal hernia.  Post Dilation diet. Instructions provided.  Continue present medications.  Please schedule barium swallow.  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 Elvin Hammer in about 6 weeks.   YOU HAD AN ENDOSCOPIC PROCEDURE TODAY AT THE [facility]:   Refer to the procedure report that was given to you for any specific questions about what was found during the examination.  If the procedure report does not answer your questions, please call your gastroenterologist to clarify.  If you requested that your care partner not be given the details of your procedure findings, then the procedure report has been included in a sealed envelope for you to review at your convenience later.  YOU SHOULD EXPECT: Some feelings of bloating in the abdomen. Passage of more gas than usual.  Walking can help get rid of the air that was put into your GI tract during the procedure and reduce the bloating. If you had a lower endoscopy (such as a colonoscopy or flexible sigmoidoscopy) you may notice spotting of blood in your stool or on the toilet paper. If you underwent a bowel prep for your procedure, you may not have a normal bowel movement for a few days.  Please Note:  You might notice some irritation and congestion in your nose or some drainage.  This is from the oxygen used during your procedure.  There is no need for concern and it should clear up in a day or so.  SYMPTOMS TO REPORT IMMEDIATELY:  Following upper endoscopy (EGD)  Vomiting of blood or coffee ground material  New chest pain or pain under the shoulder blades  Painful or persistently difficult swallowing  New shortness of breath  Fever of 100F or higher  Black, tarry-looking stools  For urgent or emergent issues, a gastroenterologist can be reached at any hour by calling [phone]. Do not use MyChart messaging for urgent concerns.    DIET:  We do recommend  a small meal at first, but then you may proceed to your regular diet.  Drink plenty of fluids but you should avoid alcoholic beverages for 24 hours.  ACTIVITY:  You should plan to take it easy for the rest of today and you should NOT DRIVE or use heavy machinery until tomorrow (because of the sedation medicines used during the test).    FOLLOW UP: Our staff will call the number listed on your records the next business day following your procedure.  We will call around 7:15- 8:00 am to check on you and address any questions or concerns that you may have regarding the information given to you following your procedure. If we do not reach you, we will leave a message.     If any biopsies were taken you will be contacted by phone or by letter within the next 1-3 weeks.  Please call us  at [phone] if you have not heard about the biopsies in 3 weeks.    SIGNATURES/CONFIDENTIALITY: You and/or your care partner have signed paperwork which will be entered into your electronic medical record.  These signatures attest to the fact that that the information above on your After Visit Summary has been reviewed and is understood.  Full responsibility of the confidentiality of this discharge information lies with you and/or your care-partner.

## 2023-05-17 ENCOUNTER — Telehealth: Payer: Self-pay

## 2023-05-17 NOTE — Telephone Encounter (Signed)
Attempted to call patient for follow up phone call. No answer, voicemail left.

## 2023-05-18 DIAGNOSIS — H401114 Primary open-angle glaucoma, right eye, indeterminate stage: Secondary | ICD-10-CM | POA: Diagnosis not present

## 2023-05-22 ENCOUNTER — Encounter: Payer: Self-pay | Admitting: Internal Medicine

## 2023-05-22 ENCOUNTER — Other Ambulatory Visit: Payer: Self-pay

## 2023-05-22 DIAGNOSIS — K225 Diverticulum of esophagus, acquired: Secondary | ICD-10-CM

## 2023-05-23 DIAGNOSIS — D225 Melanocytic nevi of trunk: Secondary | ICD-10-CM | POA: Diagnosis not present

## 2023-05-23 DIAGNOSIS — L570 Actinic keratosis: Secondary | ICD-10-CM | POA: Diagnosis not present

## 2023-05-23 DIAGNOSIS — D485 Neoplasm of uncertain behavior of skin: Secondary | ICD-10-CM | POA: Diagnosis not present

## 2023-05-23 DIAGNOSIS — L988 Other specified disorders of the skin and subcutaneous tissue: Secondary | ICD-10-CM | POA: Diagnosis not present

## 2023-05-23 DIAGNOSIS — L905 Scar conditions and fibrosis of skin: Secondary | ICD-10-CM | POA: Diagnosis not present

## 2023-05-23 DIAGNOSIS — X32XXXD Exposure to sunlight, subsequent encounter: Secondary | ICD-10-CM | POA: Diagnosis not present

## 2023-05-23 DIAGNOSIS — Z1283 Encounter for screening for malignant neoplasm of skin: Secondary | ICD-10-CM | POA: Diagnosis not present

## 2023-05-25 ENCOUNTER — Ambulatory Visit: Admitting: Physical Therapy

## 2023-05-25 ENCOUNTER — Encounter: Payer: Self-pay | Admitting: Physical Therapy

## 2023-05-25 DIAGNOSIS — G8929 Other chronic pain: Secondary | ICD-10-CM

## 2023-05-25 DIAGNOSIS — M6281 Muscle weakness (generalized): Secondary | ICD-10-CM | POA: Diagnosis not present

## 2023-05-25 DIAGNOSIS — M5459 Other low back pain: Secondary | ICD-10-CM | POA: Diagnosis not present

## 2023-05-25 DIAGNOSIS — M25552 Pain in left hip: Secondary | ICD-10-CM | POA: Diagnosis not present

## 2023-05-25 DIAGNOSIS — R262 Difficulty in walking, not elsewhere classified: Secondary | ICD-10-CM | POA: Diagnosis not present

## 2023-05-25 DIAGNOSIS — M25561 Pain in right knee: Secondary | ICD-10-CM | POA: Diagnosis not present

## 2023-05-25 NOTE — Therapy (Signed)
 OUTPATIENT PHYSICAL THERAPY TREATMENT  Patient Name: Jonathan Garrison MRN: [id number] DOB:1945/10/13, 78 y.o., male Today's Date: 05/25/2023  END OF SESSION:  PT End of Session - 05/25/23 1443     Visit Number 4    Number of Visits 12    Date for PT Re-Evaluation 07/13/23    Authorization Type MCR    PT Start Time 1430    PT Stop Time 1515    PT Time Calculation (min) 45 min    Activity Tolerance Patient tolerated treatment well    Behavior During Therapy WFL for tasks assessed/performed                     Past Medical History:  Diagnosis Date   ADD (attention deficit disorder) without hyperactivity    Allergy    Anemia    no treatment   Anxiety    Arthralgia    many joints, managed with vitamins/herbs and ibuprofen   Arthritis    Cataract    Chest pain    Colitis 1986   single attack (believes was ulcerative)   Depression    Eczema    Esophageal stricture    GERD (gastroesophageal reflux disease)    Hemorrhoids    Lichen planus    Neuromuscular disorder (HCC)    POLIO AGE 9   Oral cancer (HCC) 05/2010   plans for surgery 06/2010   Osteoporosis    hips   Pleurisy 1984   Polio age 50   Umbilical hernia    Past Surgical History:  Procedure Laterality Date   COLONOSCOPY  2012   LEG SURGERY  when in 6th grade   L leg, bone spur removal (just above knee, laterally)   TONGUE SURGERY     for tongue cancer   UPPER GASTROINTESTINAL ENDOSCOPY     Patient Active Problem List   Diagnosis Date Noted   Hyperlipidemia 05/25/2022   Localized osteoarthritis of both shoulders 03/22/2022   Lower back pain 03/22/2022   Neck pain, chronic 03/22/2022   IT band syndrome 03/22/2022   Essential hypertension 02/22/2022   Family history of heart disease 02/22/2022   Trochanteric bursitis of left hip 08/01/2021   Pain of both hip joints 07/20/2021   Seasonal allergic rhinitis 05/24/2021   ETD (Eustachian tube dysfunction), bilateral 05/24/2021   Gastroesophageal  reflux disease without esophagitis 12/15/2017   Dysphagia 12/15/2017   History of oral cancer 12/15/2017   Increased prostate specific antigen (PSA) velocity 11/27/2017   Osteopenia 08/01/2017   Vitamin B12 deficiency anemia 06/09/2014   Chest pain 03/13/2013   Depression with anxiety 08/26/2011   Overweight (BMI 25.0-29.9) 08/26/2011   Joint pain 08/26/2011   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   Oral cancer (HCC) 07/09/2010    PCP: Kaylee Partridge, MD   REFERRING PROVIDER: Wilhelmenia Harada, MD  REFERRING DIAG:M25.561 (ICD-10-CM) - Acute pain of right knee   Rationale for Evaluation and Treatment: Rehabilitation  THERAPY DIAG:  Chronic pain of right knee  Pain in left hip  Difficulty in walking, not elsewhere classified  Muscle weakness (generalized)  Other low back pain  ONSET DATE: chronic pain for years  SUBJECTIVE:  SUBJECTIVE STATEMENT: Overall feeling the best he has felt in a while. Knee pain is no more than 2/10 and back/hip pain no more than 4/10 today    PERTINENT HISTORY:  Polio as child, OA  PAIN:  Are you having pain? Yes: NPRS scale: 2-4/10 Pain location: back and hip, knee Pain description: ache Aggravating factors: stairs, worse first thing in the morning Relieving factors: meloxicam , voltaren  PRECAUTIONS: None  WEIGHT BEARING RESTRICTIONS: No  FALLS:  Has patient fallen in last 6 months? No  OCCUPATION: retired  PLOF: Independent  PATIENT GOALS: reduce pain,   NEXT MD VISIT:   OBJECTIVE:   DIAGNOSTIC FINDINGS:  Xray (right knee 4 views, left knee 4 views): Right knee with very mild degenerative findings particularly about the medial joint space  PATIENT SURVEYS:  Patient-Specific Activity Scoring Scheme  "0" represents "unable to  perform." "10" represents "able to perform at prior level. 0 1 2 3 4 5 6 7 8 9  10 (Date and Score)   Activity Eval     1. Stairs up  5    2. Stairs down 5     3. Bending over to tie shoes 4   4.walking  5       Score 19/40    Total score = sum of the activity scores/number of activities Minimum detectable change (90%CI) for average score = 2 points Minimum detectable change (90%CI) for single activity score = 3 points     COGNITION: Overall cognitive status: Within functional limits for tasks assessed     SENSATION: WFL  MUSCLE LENGTH: Hamstrings: mildly tight at 80 deg bilat ITB tightness mildly on Rt  LOWER EXTREMITY MMT:    MMT Right eval Left eval Right 05/25/23 Left 05/25/23  Hip flexion 5 5    Hip extension      Hip abduction 4 4    Hip adduction      Hip internal rotation      Hip external rotation      Knee flexion      Knee extension 49, 55 52# avg 51, 58 54.5 58# 72#  Ankle dorsiflexion      Ankle plantarflexion      Ankle inversion      Ankle eversion       (Blank rows = not tested)   LOWER EXTREMITY ROM:     Active  Right eval Left eval  Hip flexion    Hip extension    Hip abduction    Hip adduction    Hip internal rotation    Hip external rotation    Knee flexion 130 130  Knee extension 0 0  Ankle dorsiflexion    Ankle plantarflexion    Ankle inversion    Ankle eversion     (Blank rows = not tested)   GAIT: Eval: Comments: independent community Ambulator, 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  TODAY'S TREATMENT:  05/25/23 Therex: Recumbent bike L6 X 8 min  Theractivity Leg press DL 540# 9W11, SL 91# 4N82 each side DL deadlift double leg deadlift 15# X 12 reps with bending knees, and 15# X 12 reps straight leg deadlift Lat pulldown machine 45# 2X12 Chest press machine 45# 2X12 Farmers carry 25# X 6 laps each side (50 feet each) Leg extension machine DL 95# 6O13 Standing hip extensions green 2X12 bilat Standing hip abductions green  3X12 on Rt, 2X12 on left  05/11/23 Therex: Nu step L5, seat # 12 X 8 min UE/LE Supine hamstring stretch  30 sec X 3 bilat Supine bilat straight leg lifts for core, lifting legs 6 inches, X 12 vertical, X 12 horizontal abd/add X 12  Theractivity Leg press DL 40# 9W11, SL 91# 4N82 each side SL deadlift was attempted but too difficult to coordinate today as well as cramping in his hamstrings so modified this to double leg deadlift 15# X 12 reps Standing hamstring curls green 2X12 bilat Standing hip extensions green 2X12 bilat Standing hip abductions green 3X12 on Rt, 2X12 on left    PATIENT EDUCATION: Education details: HEP, PT plan of care Person educated: Patient Education method: Explanation, Demonstration, Verbal cues, and Handouts Education comprehension: verbalized understanding and needs further education   HOME EXERCISE PROGRAM: Access Code: [email or web address] URL: [email or web address] Date: 04/20/2023 Prepared by: Jamee Mazzoni  Exercises - Seated Hamstring Stretch  - 1 x daily - 3 x weekly - 1 sets - 3 reps - 30 hold - Standing Quadriceps Stretch  - 1 x daily - 3 x weekly - 3 sets - 30 reps - Single-Leg [place] Deadlift With Dumbbell  - 1 x daily - 3 x weekly - 2 sets - 10 reps - Dynamic Straight Leg Kicks  - 1 x daily - 6 x weekly - 2 sets - 10 reps - Standing Hamstring Curl with Resistance  - 2 x daily - 6 x weekly - 2 sets - 10 reps - Standing Hip Abduction with Resistance at Ankles and Counter Support  - 2 x daily - 6 x weekly - 2 sets - 10 reps  ASSESSMENT:  CLINICAL IMPRESSION: He was overall feeling better today and I did show him more gym equipment he can use at [facility] as he does have membership there. I did retest quad strength today and he showed tremendous improvement on left side and some improvement on Right side so discussed doing extra working sets on his right leg. Some of this weakness in right leg may also be due to polio as child.   OBJECTIVE  IMPAIRMENTS: decreased activity tolerance, difficulty walking, decreased balance, decreased endurance, decreased mobility, decreased ROM, decreased strength, impaired flexibility, impaired LE use, postural dysfunction, and pain.  ACTIVITY LIMITATIONS: bending, lifting, carry, locomotion, cleaning, community activity,  PERSONAL FACTORS: childhood polio, OA are also affecting patient's functional outcome.  REHAB POTENTIAL: Good  CLINICAL DECISION MAKING: Stable/uncomplicated  EVALUATION COMPLEXITY: Low    GOALS: Short term PT Goals Target date: 05/18/2023     Pt will be I and compliant with HEP. Baseline:  Goal status: MET 05/11/23 Pt will decrease pain by 25% overall Baseline: Goal status: Met 05/11/23  Long term PT goals Target date:07/13/23   Pt will improve  hip/knee strength to at least 5-/5 MMT to improve functional strength Baseline: Goal status: ongoing 05/25/23 Pt will improve PSFS to 37/40 or more to show improved function Baseline: Goal status: ongoing 05/25/23 Pt will reduce pain to overall less than 3/10 with usual activity and kayaking Baseline: Goal status: ongoing 05/25/23 4. Pt will be able to hold SLS X 15 seconds best and 10 sec on avg for his Rt leg to show improved balance.  Baseline:  Goal status: ongoing 05/25/23  PLAN: PT FREQUENCY: 1 time per 1-2 weeks  PT DURATION: 12 weeks  PLANNED INTERVENTIONS (unless contraindicated): aquatic PT, Canalith repositioning, cryotherapy, Electrical stimulation, Iontophoresis with 4 mg/ml dexamethasome, Moist heat, traction, Ultrasound, gait training, Therapeutic exercise, balance training, neuromuscular re-education, patient/family education, prosthetic training, manual techniques, passive ROM, dry needling, taping, vasopnuematic device, vestibular, spinal  manipulations, joint manipulations  PLAN FOR NEXT SESSION: hip/knee strength focus. Functional strength and endurance training as tolerated.   Mick Alamin,  PT,DPT 05/25/2023, 2:44 PM

## 2023-06-01 DIAGNOSIS — K149 Disease of tongue, unspecified: Secondary | ICD-10-CM | POA: Diagnosis not present

## 2023-06-02 ENCOUNTER — Ambulatory Visit (HOSPITAL_COMMUNITY)
Admission: RE | Admit: 2023-06-02 | Discharge: 2023-06-02 | Disposition: A | Discharge status: Home / Self Care | Source: Ambulatory Visit | Attending: Internal Medicine | Admitting: Internal Medicine

## 2023-06-02 DIAGNOSIS — K225 Diverticulum of esophagus, acquired: Secondary | ICD-10-CM | POA: Diagnosis not present

## 2023-06-02 DIAGNOSIS — R1310 Dysphagia, unspecified: Secondary | ICD-10-CM | POA: Diagnosis not present

## 2023-06-02 DIAGNOSIS — R140 Abdominal distension (gaseous): Secondary | ICD-10-CM | POA: Diagnosis not present

## 2023-06-07 DIAGNOSIS — L988 Other specified disorders of the skin and subcutaneous tissue: Secondary | ICD-10-CM | POA: Diagnosis not present

## 2023-06-07 DIAGNOSIS — D485 Neoplasm of uncertain behavior of skin: Secondary | ICD-10-CM | POA: Diagnosis not present

## 2023-06-08 DIAGNOSIS — F422 Mixed obsessional thoughts and acts: Secondary | ICD-10-CM | POA: Diagnosis not present

## 2023-06-08 DIAGNOSIS — F410 Panic disorder [episodic paroxysmal anxiety] without agoraphobia: Secondary | ICD-10-CM | POA: Diagnosis not present

## 2023-06-13 ENCOUNTER — Encounter: Payer: Self-pay | Admitting: Family Medicine

## 2023-06-13 ENCOUNTER — Encounter: Admitting: Rehabilitative and Restorative Service Providers"

## 2023-06-14 ENCOUNTER — Ambulatory Visit: Payer: Self-pay | Admitting: Internal Medicine

## 2023-06-15 ENCOUNTER — Encounter: Payer: Self-pay | Admitting: Rehabilitative and Restorative Service Providers"

## 2023-06-15 ENCOUNTER — Ambulatory Visit (INDEPENDENT_AMBULATORY_CARE_PROVIDER_SITE_OTHER): Admitting: Rehabilitative and Restorative Service Providers"

## 2023-06-15 DIAGNOSIS — G8929 Other chronic pain: Secondary | ICD-10-CM

## 2023-06-15 DIAGNOSIS — M25552 Pain in left hip: Secondary | ICD-10-CM | POA: Diagnosis not present

## 2023-06-15 DIAGNOSIS — M6281 Muscle weakness (generalized): Secondary | ICD-10-CM | POA: Diagnosis not present

## 2023-06-15 DIAGNOSIS — M25561 Pain in right knee: Secondary | ICD-10-CM

## 2023-06-15 DIAGNOSIS — M5459 Other low back pain: Secondary | ICD-10-CM | POA: Diagnosis not present

## 2023-06-15 DIAGNOSIS — R262 Difficulty in walking, not elsewhere classified: Secondary | ICD-10-CM

## 2023-06-15 NOTE — Therapy (Signed)
 OUTPATIENT PHYSICAL THERAPY TREATMENT  Patient Name: Jonathan Garrison MRN: [id number] DOB:[birth year], 78 y.o., male Today's Date: 06/15/2023  END OF SESSION:  PT End of Session - 06/15/23 1435     Visit Number 5    Number of Visits 12    Date for PT Re-Evaluation 07/13/23    Authorization Type MCR    Progress Note Due on Visit 10    PT Start Time 1430    PT Stop Time 1510    PT Time Calculation (min) 40 min    Activity Tolerance Patient tolerated treatment well    Behavior During Therapy WFL for tasks assessed/performed                      Past Medical History:  Diagnosis Date   ADD (attention deficit disorder) without hyperactivity    Allergy    Anemia    no treatment   Anxiety    Arthralgia    many joints, managed with vitamins/herbs and ibuprofen   Arthritis    Cataract    Chest pain    Colitis 1986   single attack (believes was ulcerative)   Depression    Eczema    Esophageal stricture    GERD (gastroesophageal reflux disease)    Hemorrhoids    Lichen planus    Neuromuscular disorder (HCC)    POLIO AGE 12   Oral cancer (HCC) 05/2010   plans for surgery 06/2010   Osteoporosis    hips   Pleurisy 1984   Polio age 73   Umbilical hernia    Past Surgical History:  Procedure Laterality Date   COLONOSCOPY  2012   LEG SURGERY  when in 6th grade   L leg, bone spur removal (just above knee, laterally)   TONGUE SURGERY     for tongue cancer   UPPER GASTROINTESTINAL ENDOSCOPY     Patient Active Problem List   Diagnosis Date Noted   Hyperlipidemia 05/25/2022   Localized osteoarthritis of both shoulders 03/22/2022   Lower back pain 03/22/2022   Neck pain, chronic 03/22/2022   IT band syndrome 03/22/2022   Essential hypertension 02/22/2022   Family history of heart disease 02/22/2022   Trochanteric bursitis of left hip 08/01/2021   Pain of both hip joints 07/20/2021   Seasonal allergic rhinitis 05/24/2021   ETD (Eustachian tube dysfunction),  bilateral 05/24/2021   Gastroesophageal reflux disease without esophagitis 12/15/2017   Dysphagia 12/15/2017   History of oral cancer 12/15/2017   Increased prostate specific antigen (PSA) velocity 11/27/2017   Osteopenia 08/01/2017   Vitamin B12 deficiency anemia 06/09/2014   Chest pain 03/13/2013   Depression with anxiety 08/26/2011   Overweight (BMI 25.0-29.9) 08/26/2011   Joint pain 08/26/2011   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   Oral cancer (HCC) 07/09/2010    PCP: Kaylee Partridge, MD   REFERRING PROVIDER: Wilhelmenia Harada, MD  REFERRING DIAG:M25.561 (ICD-10-CM) - Acute pain of right knee   Rationale for Evaluation and Treatment: Rehabilitation  THERAPY DIAG:  Chronic pain of right knee  Pain in left hip  Muscle weakness (generalized)  Difficulty in walking, not elsewhere classified  Other low back pain  ONSET DATE: chronic pain for years  SUBJECTIVE:  SUBJECTIVE STATEMENT: Pt indicated some twinges in Lt hip at times.  Pt indicated Rt knee pain present as well at times.  Pt indicated morning times can be some trouble.  Reported tired in feet as well.   Pt indicated overall improvement to goal expectations at 60 %.   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 No  OCCUPATION: retired  PLOF: Independent  PATIENT GOALS: reduce pain,   NEXT MD VISIT:   OBJECTIVE:   DIAGNOSTIC FINDINGS:  Xray (right knee 4 views, left knee 4 views): Right knee with very mild  degenerative findings particularly about the medial joint space  PATIENT SURVEYS:  Patient-Specific Activity Scoring Scheme  "0" represents "unable to perform." "10" represents "able to perform at prior level. 0 1 2 3 4 5 6 7 8 9  10 (Date and Score)   Activity Eval  06/15/2023   1. Stairs up  5  5  2. Stairs down 5   5  3. Bending over to tie shoes 4 6  4.walking  5 6      Score 4.75 avg 5.5 avg   Total score = sum of the activity scores/number of activities Minimum detectable change (90%CI) for average score = 2 points Minimum detectable change (90%CI) for single activity score = 3 points  COGNITION: Eval:  Overall cognitive status: Within functional limits for tasks assessed     SENSATION: Eval  WFL  MUSCLE LENGTH: Eval: Hamstrings: mildly tight at 80 deg bilat ITB tightness mildly on Rt  LOWER EXTREMITY MMT:    MMT Right eval Left eval Right 05/25/23 Left 05/25/23    Hip flexion 5 5      Hip extension        Hip abduction 4 4      Hip adduction        Hip internal rotation        Hip external rotation        Knee flexion        Knee extension 49, 55 52# avg 51, 58 54.5 58# 72#    Ankle dorsiflexion        Ankle plantarflexion        Ankle inversion        Ankle eversion         (Blank rows = not tested)   LOWER EXTREMITY ROM:     Active  Right eval Left eval  Hip flexion    Hip extension    Hip abduction    Hip adduction    Hip internal rotation    Hip external rotation    Knee flexion 130 130  Knee extension 0 0  Ankle dorsiflexion    Ankle plantarflexion    Ankle inversion    Ankle eversion     (Blank rows = not tested)   GAIT: Eval: Comments: independent community Ambulator, 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                    TODAY'S TREATMENT:      DATE:  06/15/2023 Therex: Recumbent bike lvl 6-8 seat 9 - 10 mins   TherActivity: (to improve functional movement ability in squatting, stairs, transfers,  lifting) Leg press double leg  2 x 12 100 lbs , single leg 2 x 12 Lt, 3 x 12 Rt 50 lbs  - slow  Farmers carry 25 lbs x 6 laps each side 50 ft laps    Neuro Re-ed: (to improve balance control, coordination) SLS on black mat with corner touching c contralateral leg x 6 bilateral with occasional HHA on bar Tandem stance 30 sec x 3 bilateral with occasoinal HHA on bars for correction ( mirror feedback)   TODAY'S TREATMENT:      DATE: 05/25/23 Therex: Recumbent bike L6 X 8 min  Theractivity Leg press DL 308# 6V78, SL 46# 9G29 each side DL deadlift double leg deadlift 15# X 12 reps with bending knees, and 15# X 12 reps straight leg deadlift Lat pulldown machine 45# 2X12 Chest press machine 45# 2X12 Farmers carry 25# X 6 laps each side (50 feet each) Leg extension machine DL 52# 8U13 Standing hip extensions green 2X12 bilat Standing hip abductions green 3X12 on Rt, 2X12 on left  TODAY'S TREATMENT:      DATE:05/11/23 Therex: Nu step L5, seat # 12 X 8 min UE/LE Supine hamstring stretch 30 sec X 3 bilat Supine bilat straight leg lifts for core, lifting legs 6 inches, X 12 vertical, X 12 horizontal abd/add X 12  Theractivity Leg press DL 24# 4W10, SL 27# 2Z36 each side SL deadlift was attempted but too difficult to coordinate today as well as cramping in his hamstrings so modified this to double leg deadlift 15# X 12 reps Standing hamstring curls green 2X12 bilat Standing hip extensions green 2X12 bilat Standing hip abductions green 3X12 on Rt, 2X12 on left    PATIENT EDUCATION: Eval  Education details: HEP, PT plan of care Person educated: Patient Education method: Explanation, Demonstration, Verbal cues, and Handouts Education comprehension: verbalized understanding and needs further education   HOME EXERCISE PROGRAM: Access Code: [email or web address] URL: [email or web address] Date: 04/20/2023 Prepared by: Jamee Mazzoni  Exercises - Seated Hamstring Stretch  - 1 x daily - 3 x weekly - 1 sets - 3 reps -  30 hold - Standing Quadriceps Stretch  - 1 x daily - 3 x weekly - 3 sets - 30 reps - Single-Leg [place] Deadlift With Dumbbell  - 1 x daily - 3 x weekly - 2 sets - 10 reps - Dynamic Straight Leg Kicks  - 1 x daily - 6 x weekly - 2 sets - 10 reps - Standing Hamstring Curl with Resistance  - 2 x daily - 6 x weekly - 2 sets - 10 reps - Standing Hip Abduction with Resistance at Ankles and Counter Support  - 2 x daily - 6 x weekly - 2 sets - 10 reps  ASSESSMENT:  CLINICAL IMPRESSION:  Mild to moderate improvements noted in patient specific functional scale and percentage improvement.  Fair control in stability static holds today.  Continued functional strengthening and coordination improvements will help functional activity.   OBJECTIVE IMPAIRMENTS: decreased activity tolerance, difficulty walking, decreased balance, decreased endurance, decreased mobility, decreased ROM, decreased strength, impaired flexibility, impaired LE use, postural dysfunction, and pain.  ACTIVITY LIMITATIONS: bending, lifting, carry, locomotion, cleaning, community activity,  PERSONAL FACTORS: childhood polio, OA are also affecting patient's functional outcome.  REHAB POTENTIAL: Good  CLINICAL DECISION MAKING: Stable/uncomplicated  EVALUATION COMPLEXITY: Low    GOALS: Short term PT Goals Target date: 05/18/2023  Pt will be I and compliant with HEP. Baseline:  Goal status: MET 05/11/23 Pt will decrease pain  by 25% overall Baseline: Goal status: Met 05/11/23  Long term PT goals Target date:07/13/23   Pt will improve  hip/knee strength to at least 5-/5 MMT to improve functional strength Baseline: Goal status: ongoing 05/25/23 Pt will improve PSFS to 37/40 or more to show improved function Baseline: Goal status: ongoing 05/25/23 Pt will reduce pain to overall less than 3/10 with usual activity and kayaking Baseline: Goal status: ongoing 05/25/23 4. Pt will be able to hold SLS X 15 seconds best and 10 sec on avg  for his Rt leg to show improved balance.  Baseline:  Goal status: ongoing 05/25/23  PLAN: PT FREQUENCY: 1 time per 1-2 weeks  PT DURATION: 12 weeks  PLANNED INTERVENTIONS (unless contraindicated): aquatic PT, Canalith repositioning, cryotherapy, Electrical stimulation, Iontophoresis with 4 mg/ml dexamethasome, Moist heat, traction, Ultrasound, gait training, Therapeutic exercise, balance training, neuromuscular re-education, patient/family education, prosthetic training, manual techniques, passive ROM, dry needling, taping, vasopnuematic device, vestibular, spinal manipulations, joint manipulations  PLAN FOR NEXT SESSION: Strengthening, balance. Check dynamometry     Bonna Bustard, PT, DPT, OCS, ATC 06/15/23  3:22 PM

## 2023-06-20 ENCOUNTER — Encounter: Payer: Self-pay | Admitting: Family Medicine

## 2023-06-20 DIAGNOSIS — F988 Other specified behavioral and emotional disorders with onset usually occurring in childhood and adolescence: Secondary | ICD-10-CM

## 2023-06-20 MED ORDER — AMPHETAMINE-DEXTROAMPHETAMINE 20 MG PO TABS: 20.0000 mg | ORAL_TABLET | Freq: Two times a day (BID) | ORAL | 0 refills | Status: DC

## 2023-06-27 ENCOUNTER — Encounter: Admitting: Rehabilitative and Restorative Service Providers"

## 2023-06-28 DIAGNOSIS — H401114 Primary open-angle glaucoma, right eye, indeterminate stage: Secondary | ICD-10-CM | POA: Diagnosis not present

## 2023-06-29 DIAGNOSIS — K1370 Unspecified lesions of oral mucosa: Secondary | ICD-10-CM | POA: Diagnosis not present

## 2023-06-29 DIAGNOSIS — K1329 Other disturbances of oral epithelium, including tongue: Secondary | ICD-10-CM | POA: Diagnosis not present

## 2023-07-03 ENCOUNTER — Ambulatory Visit: Admitting: Internal Medicine

## 2023-07-03 ENCOUNTER — Ambulatory Visit (INDEPENDENT_AMBULATORY_CARE_PROVIDER_SITE_OTHER): Admitting: Internal Medicine

## 2023-07-03 ENCOUNTER — Encounter: Payer: Self-pay | Admitting: Internal Medicine

## 2023-07-03 VITALS — BP 122/80 | HR 95 | Ht 74.0 in | Wt 193.0 lb

## 2023-07-03 DIAGNOSIS — K2100 Gastro-esophageal reflux disease with esophagitis, without bleeding: Secondary | ICD-10-CM

## 2023-07-03 DIAGNOSIS — K225 Diverticulum of esophagus, acquired: Secondary | ICD-10-CM | POA: Diagnosis not present

## 2023-07-03 DIAGNOSIS — R1319 Other dysphagia: Secondary | ICD-10-CM

## 2023-07-03 DIAGNOSIS — K222 Esophageal obstruction: Secondary | ICD-10-CM

## 2023-07-03 DIAGNOSIS — K219 Gastro-esophageal reflux disease without esophagitis: Secondary | ICD-10-CM

## 2023-07-03 NOTE — Patient Instructions (Signed)
 Please follow up 6 months.  _______________________________________________________  If your blood pressure at your visit was 140/90 or greater, please contact your primary care physician to follow up on this.  _______________________________________________________  If you are age 77 or older, your body mass index should be between 23-30. Your Body mass index is 24.78 kg/m. If this is out of the aforementioned range listed, please consider follow up with your Primary Care Provider.  If you are age 16 or younger, your body mass index should be between 19-25. Your Body mass index is 24.78 kg/m. If this is out of the aformentioned range listed, please consider follow up with your Primary Care Provider.   ________________________________________________________  The [facility] providers would like to encourage you to use MYCHART to communicate with providers for non-urgent requests or questions.  Due to long hold times on the telephone, sending your provider a message by [facility] may be a faster and more efficient way to get a response.  Please allow 48 business hours for a response.  Please remember that this is for non-urgent requests.  _______________________________________________________

## 2023-07-05 ENCOUNTER — Encounter: Payer: Self-pay | Admitting: Internal Medicine

## 2023-07-05 NOTE — Progress Notes (Signed)
 HISTORY OF PRESENT ILLNESS:  Jonathan Garrison is a 78 y.o. male with past medical history as listed below.  He is followed in this office for screening colonoscopy and GERD complicated by severe erosive esophagitis and peptic stricture requiring esophageal dilation.  He was most recently seen in the office by the GI physician assistant on April 19, 2023 regarding dysphagia.  In addition to esophageal dysphagia he described a sensation of food or pills sticking in the side of his neck.  For that he was scheduled for upper endoscopy.  Upper endoscopy was performed May 16, 2023.  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  He was also noted to have a distal esophageal stricture which was dilated to 20 mm.  No active esophagitis.  He subsequently underwent barium swallow with tablet.  He was found to have a moderate-sized midline to right sided paracentral Zenker's diverticulum measuring 2.6 x 2.4 x 1.2 cm.  There was some laryngeal penetration without aspiration.  A barium tablet passed throughout easily.  He presents today for follow-up.  We reviewed the endoscopic and radiographic findings today.  I brought him to my office to review radiologic images.  He continues to have the same sensation of food or pills sticking in the side of his neck for which she will eventually maneuver them out.  He denies coughing or choking spells.  He has issues with esophageal dysphagia improved post dilation.  He remains on omeprazole  40 mg p.o. twice daily (dose required to heal his esophagitis) without issue.  He has a number of appropriate questions.  For today's visit.  REVIEW OF SYSTEMS:  All non-GI ROS negative. 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 His smoking use included pipe and cigarettes. He has never used smokeless tobacco. He reports current alcohol use of about 7.0 - 14.0 standard drinks of alcohol per week. He reports that he does not use drugs.  family history includes Benign prostatic hyperplasia in his father; Cancer in his sister; Diabetes in his maternal grandfather and mother; Heart disease in his father; Hyperlipidemia in his mother; Hypertension in his father and mother; Lichen planus in his sister.  No Known Allergies     PHYSICAL EXAMINATION: Vital signs: BP 122/80   Pulse 95   Ht 6' 2 (1.88  m)   Wt 193 lb (87.5 kg)   BMI 24.78 kg/m  General: Well-developed, well-nourished, no acute distress HEENT: Sclerae are anicteric, conjunctiva pink. Oral mucosa intact Lungs: Clear Heart: Regular Abdomen: soft, nontender, nondistended, no obvious ascites, no peritoneal signs, normal bowel sounds. No organomegaly. Extremities: No edema Psychiatric: alert and oriented x3. Cooperative  ASSESSMENT:  1.  Zenker's diverticulum.  No significant problems such as nocturnal regurgitation, coughing, or choking.  Minimal effect on his ability to consume his meals. 2.  GERD complicated by erosive esophagitis and peptic stricture.  Stable post dilation on high-dose PPI 3.   Colonoscopy 2022 without neoplasia 3.  General Medical problems.  Stable   PLAN:  1.  Discussed with Jonathan Garrison current treatment options for Zenker's diverticulum including Z POEM.  At this point he would like to continue with observation and follow-up.  I think this is reasonable.  I did ask him to contact me should he develop issues with choking, coughing, aspiration, or problems with meals. 2.  Reflux precautions 3.  Continue twice daily PPI 4.  Office follow-up 6 months A total time of 30 minutes was spent preparing to see the patient, obtaining interval history, performing medically appropriate physical exam, counseling and educating the patient regarding the above listed issues, defining follow-up parameters, and documenting clinical information in the health record.

## 2023-07-11 ENCOUNTER — Encounter: Payer: Self-pay | Admitting: Rehabilitative and Restorative Service Providers"

## 2023-07-11 ENCOUNTER — Ambulatory Visit (INDEPENDENT_AMBULATORY_CARE_PROVIDER_SITE_OTHER): Admitting: Rehabilitative and Restorative Service Providers"

## 2023-07-11 DIAGNOSIS — M5459 Other low back pain: Secondary | ICD-10-CM

## 2023-07-11 DIAGNOSIS — M25561 Pain in right knee: Secondary | ICD-10-CM | POA: Diagnosis not present

## 2023-07-11 DIAGNOSIS — M6281 Muscle weakness (generalized): Secondary | ICD-10-CM | POA: Diagnosis not present

## 2023-07-11 DIAGNOSIS — M25552 Pain in left hip: Secondary | ICD-10-CM | POA: Diagnosis not present

## 2023-07-11 DIAGNOSIS — G8929 Other chronic pain: Secondary | ICD-10-CM

## 2023-07-11 DIAGNOSIS — R262 Difficulty in walking, not elsewhere classified: Secondary | ICD-10-CM

## 2023-07-11 NOTE — Therapy (Signed)
 OUTPATIENT PHYSICAL THERAPY TREATMENT  Patient Name: Torey Reinard MRN: [id number] DOB:1945-02-20, 78 y.o., male Today's Date: 07/11/2023  END OF SESSION:  PT End of Session - 07/11/23 1440     Visit Number 6    Number of Visits 12    Date for PT Re-Evaluation 07/13/23    Authorization Type MCR    Progress Note Due on Visit 10    PT Start Time 1433    PT Stop Time 1512    PT Time Calculation (min) 39 min    Activity Tolerance Patient tolerated treatment well    Behavior During Therapy WFL for tasks assessed/performed                    Past Medical History:  Diagnosis Date   ADD (attention deficit disorder) without hyperactivity    Allergy    Anemia    no treatment   Anxiety    Arthralgia    many joints, managed with vitamins/herbs and ibuprofen   Arthritis    Cataract    Chest pain    Colitis 1986   single attack (believes was ulcerative)   Depression    Eczema    Esophageal stricture    GERD (gastroesophageal reflux disease)    Hemorrhoids    Lichen planus    Neuromuscular disorder (HCC)    POLIO AGE 82   Oral cancer (HCC) 05/2010   plans for surgery 06/2010   Osteoporosis    hips   Pleurisy 1984   Polio age 89   Umbilical hernia    Past Surgical History:  Procedure Laterality Date   COLONOSCOPY  2012   LEG SURGERY  when in 6th grade   L leg, bone spur removal (just above knee, laterally)   TONGUE SURGERY     for tongue cancer   UPPER GASTROINTESTINAL ENDOSCOPY     Patient Active Problem List   Diagnosis Date Noted   Hyperlipidemia 05/25/2022   Localized osteoarthritis of both shoulders 03/22/2022   Lower back pain 03/22/2022   Neck pain, chronic 03/22/2022   IT band syndrome 03/22/2022   Essential hypertension 02/22/2022   Family history of heart disease 02/22/2022   Trochanteric bursitis of left hip 08/01/2021   Pain of both hip joints 07/20/2021   Seasonal allergic rhinitis 05/24/2021   ETD (Eustachian tube dysfunction), bilateral  05/24/2021   Gastroesophageal reflux disease without esophagitis 12/15/2017   Dysphagia 12/15/2017   History of oral cancer 12/15/2017   Increased prostate specific antigen (PSA) velocity 11/27/2017   Osteopenia 08/01/2017   Vitamin B12 deficiency anemia 06/09/2014   Chest pain 03/13/2013   Depression with anxiety 08/26/2011   Overweight (BMI 25.0-29.9) 08/26/2011   Joint pain 08/26/2011   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   Oral cancer (HCC) 07/09/2010    PCP: Kaylee Partridge, MD   REFERRING PROVIDER: Wilhelmenia Harada, MD  REFERRING DIAG:M25.561 (ICD-10-CM) - Acute pain of right knee   Rationale for Evaluation and Treatment: Rehabilitation  THERAPY DIAG:  Chronic pain of right knee  Pain in left hip  Muscle weakness (generalized)  Difficulty in walking, not elsewhere classified  Other low back pain  ONSET DATE: chronic pain for years  SUBJECTIVE:  SUBJECTIVE STATEMENT: Pt indicated going off and doing some kayaking recently.  Pt indicated doing well with most things but did notice lower back complaints with prolonged sitting in kayak.   PERTINENT HISTORY:  Polio as child, OA  PAIN:  NPRS scale: up to 7/10.  Pain location: back Pain description: ache Aggravating factors: sitting prolonged in kayak Relieving factors: meloxicam , voltaren  PRECAUTIONS: None  WEIGHT BEARING RESTRICTIONS: No  FALLS:  Has patient fallen in last 6 months? No  OCCUPATION: retired  PLOF: Independent  PATIENT GOALS: reduce pain,   NEXT MD VISIT:   OBJECTIVE:   DIAGNOSTIC FINDINGS:  Xray (right knee 4 views, left knee 4 views): Right knee with very mild degenerative findings particularly about the medial joint space  PATIENT SURVEYS:  Patient-Specific Activity Scoring Scheme   0 represents "unable to perform." 10 represents "able to perform at prior level. 0 1 2 3 4 5 6 7 8 9  10 (Date and Score)   Activity Eval  06/15/2023   1. Stairs up  5  5  2. Stairs down 5   5  3. Bending over to tie shoes 4 6  4.walking  5 6      Score 4.75 avg 5.5 avg   Total score = sum of the activity scores/number of activities Minimum detectable change (90%CI) for average score = 2 points Minimum detectable change (90%CI) for single activity score = 3 points  COGNITION: Eval:  Overall cognitive status: Within functional limits for tasks assessed     SENSATION: Eval  WFL  MUSCLE LENGTH: Eval: Hamstrings: mildly tight at 80 deg bilat ITB tightness mildly on Rt  LOWER EXTREMITY MMT:    MMT Right eval Left eval Right 05/25/23 Left 05/25/23 Right 07/11/2023 Left 07/11/2023  Hip flexion 5 5      Hip extension        Hip abduction 4 4      Hip adduction        Hip internal rotation        Hip external rotation        Knee flexion        Knee extension 49, 55 52# avg 51, 58 54.5 58# 72# 5/5 60.7, 57 lbs 5/5 79, 73 lbs  Ankle dorsiflexion        Ankle plantarflexion        Ankle inversion        Ankle eversion         (Blank rows = not tested)   LOWER EXTREMITY ROM:     Active  Right eval Left eval  Hip flexion    Hip extension    Hip abduction    Hip adduction    Hip internal rotation    Hip external rotation    Knee flexion 130 130  Knee extension 0 0  Ankle dorsiflexion    Ankle plantarflexion    Ankle inversion    Ankle eversion     (Blank rows = not tested)   GAIT: Eval: Comments: independent community Ambulator, 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                    TODAY'S TREATMENT:      DATE:  07/11/2023 Therex: Nustep Lvl 7 UE/LE for 6 mins Machine rows x 12 55 lbs, x 12 65 lbs  Machine lat pull down x 12 45 lbs , x 12 55 lbs Machine chin up pull down x 12 55 lbs, x 12  55 lbs  Discussion and demonstration of variable pull up and  chin up techniques.  Discussed assistive concentric with foot help and then focus on lowering.   TherActivity: (to improve functional movement ability in squatting, stairs, transfers, lifting) Functional kettle bell squat 25 lbs 2 x 12  Farmers carry 25 lbs x 6 laps each side 50 ft laps  Leg press  single leg 2 x 12 Lt, 3 x 12 Rt 50 lbs  - slow    TODAY'S TREATMENT:      DATE:  06/15/2023 Therex: Recumbent bike lvl 6-8 seat 9 - 10 mins   TherActivity: (to improve functional movement ability in squatting, stairs, transfers, lifting) Leg press double leg 2 x 12 100 lbs , single leg 2 x 12 Lt, 3 x 12 Rt 50 lbs  - slow  Farmers carry 25 lbs x 6 laps each side 50 ft laps    Neuro Re-ed: (to improve balance control, coordination) SLS on black mat with corner touching c contralateral leg x 6 bilateral with occasional HHA on bar Tandem stance 30 sec x 3 bilateral with occasoinal HHA on bars for correction ( mirror feedback)   TODAY'S TREATMENT:      DATE: 05/25/23 Therex: Recumbent bike L6 X 8 min  Theractivity Leg press DL 621# 3Y86, SL 57# 8I69 each side DL deadlift double leg deadlift 15# X 12 reps with bending knees, and 15# X 12 reps straight leg deadlift Lat pulldown machine 45# 2X12 Chest press machine 45# 2X12 Farmers carry 25# X 6 laps each side (50 feet each) Leg extension machine DL 62# [id number] Standing hip extensions green 2X12 bilat Standing hip abductions green 3X12 on Rt, 2X12 on left   PATIENT EDUCATION: Eval  Education details: HEP, PT plan of care Person educated: Patient Education method: Explanation, Demonstration, Verbal cues, and Handouts Education comprehension: verbalized understanding and needs further education   HOME EXERCISE PROGRAM: Access Code: [email or web address] URL: [email or web address] Date: 04/20/2023 Prepared by: Jamee Mazzoni  Exercises - Seated Hamstring Stretch  - 1 x daily - 3 x weekly - 1 sets - 3 reps - 30 hold - Standing Quadriceps  Stretch  - 1 x daily - 3 x weekly - 3 sets - 30 reps - Single-Leg [place] Deadlift With Dumbbell  - 1 x daily - 3 x weekly - 2 sets - 10 reps - Dynamic Straight Leg Kicks  - 1 x daily - 6 x weekly - 2 sets - 10 reps - Standing Hamstring Curl with Resistance  - 2 x daily - 6 x weekly - 2 sets - 10 reps - Standing Hip Abduction with Resistance at Ankles and Counter Support  - 2 x daily - 6 x weekly - 2 sets - 10 reps  ASSESSMENT:  CLINICAL IMPRESSION:  Reviewed plan for stretching to help back symptoms including routine after activity and before bed to try.  Continued mixture of progressive strengthening for in clinic activity with focus on improving muscle performance with daily and recreational activity.    OBJECTIVE IMPAIRMENTS: decreased activity tolerance, difficulty walking, decreased balance, decreased endurance, decreased mobility, decreased ROM, decreased strength, impaired flexibility, impaired LE use, postural dysfunction, and pain.  ACTIVITY LIMITATIONS: bending, lifting, carry, locomotion, cleaning, community activity,  PERSONAL FACTORS: childhood polio, OA are also affecting patient's functional outcome.  REHAB POTENTIAL: Good  CLINICAL DECISION MAKING: Stable/uncomplicated  EVALUATION COMPLEXITY: Low    GOALS: Short term PT Goals Target date: 05/18/2023  Pt will be I and compliant with  HEP. Baseline:  Goal status: MET 05/11/23 Pt will decrease pain by 25% overall Baseline: Goal status: Met 05/11/23  Long term PT goals Target date:07/13/23   Pt will improve  hip/knee strength to at least 5-/5 MMT to improve functional strength Baseline: Goal status: ongoing 07/11/2023 Pt will improve PSFS to 37/40 or more to show improved function Baseline: Goal status: ongoing 07/11/2023 Pt will reduce pain to overall less than 3/10 with usual activity and kayaking Baseline: Goal status: ongoing 07/11/2023 4. Pt will be able to hold SLS X 15 seconds best and 10 sec on avg for his  Rt leg to show improved balance.  Baseline:  Goal status: ongoing 07/11/2023  PLAN: PT FREQUENCY: 1 time per 1-2 weeks  PT DURATION: 12 weeks  PLANNED INTERVENTIONS (unless contraindicated): aquatic PT, Canalith repositioning, cryotherapy, Electrical stimulation, Iontophoresis with 4 mg/ml dexamethasome, Moist heat, traction, Ultrasound, gait training, Therapeutic exercise, balance training, neuromuscular re-education, patient/family education, prosthetic training, manual techniques, passive ROM, dry needling, taping, vasopnuematic device, vestibular, spinal manipulations, joint manipulations  PLAN FOR NEXT SESSION: Recert for medicare/POC required upon return.     Bonna Bustard, PT, DPT, OCS, ATC 07/11/23  3:21 PM

## 2023-07-12 DIAGNOSIS — H401114 Primary open-angle glaucoma, right eye, indeterminate stage: Secondary | ICD-10-CM | POA: Diagnosis not present

## 2023-07-12 DIAGNOSIS — F410 Panic disorder [episodic paroxysmal anxiety] without agoraphobia: Secondary | ICD-10-CM | POA: Diagnosis not present

## 2023-07-12 DIAGNOSIS — F422 Mixed obsessional thoughts and acts: Secondary | ICD-10-CM | POA: Diagnosis not present

## 2023-08-03 ENCOUNTER — Ambulatory Visit: Admitting: Internal Medicine

## 2023-08-10 ENCOUNTER — Ambulatory Visit (INDEPENDENT_AMBULATORY_CARE_PROVIDER_SITE_OTHER): Admitting: Rehabilitative and Restorative Service Providers"

## 2023-08-10 ENCOUNTER — Encounter: Payer: Self-pay | Admitting: Rehabilitative and Restorative Service Providers"

## 2023-08-10 DIAGNOSIS — M6281 Muscle weakness (generalized): Secondary | ICD-10-CM

## 2023-08-10 DIAGNOSIS — R262 Difficulty in walking, not elsewhere classified: Secondary | ICD-10-CM | POA: Diagnosis not present

## 2023-08-10 DIAGNOSIS — M25552 Pain in left hip: Secondary | ICD-10-CM

## 2023-08-10 DIAGNOSIS — G8929 Other chronic pain: Secondary | ICD-10-CM

## 2023-08-10 DIAGNOSIS — M25561 Pain in right knee: Secondary | ICD-10-CM | POA: Diagnosis not present

## 2023-08-10 NOTE — Therapy (Addendum)
 OUTPATIENT PHYSICAL THERAPY TREATMENT / PROGRESS NOTE/ RECERT  /DISCHARGE  Patient Name: Jonathan Garrison MRN: [id number] DOB:Mar 01, 1945, 78 y.o., male Today's Date: 08/10/2023   Progress Note Reporting Period 04/20/2023 to 08/10/2023  See note below for Objective Data and Assessment of Progress/Goals.      END OF SESSION:  PT End of Session - 08/10/23 1344     Visit Number 7    Number of Visits 7    Date for PT Re-Evaluation 08/10/23    Authorization Type MCR    Progress Note Due on Visit --    PT Start Time 1344    PT Stop Time 1423    PT Time Calculation (min) 39 min    Activity Tolerance Patient tolerated treatment well    Behavior During Therapy WFL for tasks assessed/performed          Past Medical History:  Diagnosis Date   ADD (attention deficit disorder) without hyperactivity    Allergy    Anemia    no treatment   Anxiety    Arthralgia    many joints, managed with vitamins/herbs and ibuprofen   Arthritis    Cataract    Chest pain    Colitis 1986   single attack (believes was ulcerative)   Depression    Eczema    Esophageal stricture    GERD (gastroesophageal reflux disease)    Hemorrhoids    Lichen planus    Neuromuscular disorder (HCC)    POLIO AGE 23   Oral cancer (HCC) 05/2010   plans for surgery 06/2010   Osteoporosis    hips   Pleurisy 1984   Polio age 30   Umbilical hernia    Past Surgical History:  Procedure Laterality Date   COLONOSCOPY  2012   LEG SURGERY  when in 6th grade   L leg, bone spur removal (just above knee, laterally)   TONGUE SURGERY     for tongue cancer   UPPER GASTROINTESTINAL ENDOSCOPY     Patient Active Problem List   Diagnosis Date Noted   Hyperlipidemia 05/25/2022   Localized osteoarthritis of both shoulders 03/22/2022   Lower back pain 03/22/2022   Neck pain, chronic 03/22/2022   IT band syndrome 03/22/2022   Essential hypertension 02/22/2022   Family history of heart disease 02/22/2022   Trochanteric  bursitis of left hip 08/01/2021   Pain of both hip joints 07/20/2021   Seasonal allergic rhinitis 05/24/2021   ETD (Eustachian tube dysfunction), bilateral 05/24/2021   Gastroesophageal reflux disease without esophagitis 12/15/2017   Dysphagia 12/15/2017   History of oral cancer 12/15/2017   Increased prostate specific antigen (PSA) velocity 11/27/2017   Osteopenia 08/01/2017   Vitamin B12 deficiency anemia 06/09/2014   Chest pain 03/13/2013   Depression with anxiety 08/26/2011   Overweight (BMI 25.0-29.9) 08/26/2011   Joint pain 08/26/2011   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   Oral cancer (HCC) 07/09/2010    PCP: Watt Harlene BROCKS, MD   REFERRING PROVIDER: Genelle Standing, MD  REFERRING DIAG:M25.561 (ICD-10-CM) - Acute pain of right knee   Rationale for Evaluation and Treatment: Rehabilitation  THERAPY DIAG:  Chronic pain of right knee - Plan: PT plan of care cert/re-cert  Pain in left hip - Plan: PT plan of care cert/re-cert  Muscle weakness (generalized) - Plan: PT plan of care cert/re-cert  Difficulty in walking, not elsewhere classified - Plan: PT plan of care cert/re-cert  ONSET DATE: chronic pain for years  SUBJECTIVE:  SUBJECTIVE STATEMENT: Pt indicated having good time kayaking.  Pt indicated he was working hard doing it and felt tired in legs, arms etc with activity of getting kayak in and out of water, etc.  Pt indicated having some increased uncomfortable in back in some mornings but improved with some movement.   PERTINENT HISTORY:  Polio as child, OA  PAIN:  NPRS scale:  Pain location: back Pain description: ache Aggravating factors: sitting prolonged in kayak Relieving factors: meloxicam , voltaren  PRECAUTIONS: None  WEIGHT BEARING RESTRICTIONS:  No  FALLS:  Has patient fallen in last 6 months? No  OCCUPATION: retired  PLOF: Independent  PATIENT GOALS: reduce pain,   NEXT MD VISIT:   OBJECTIVE:   DIAGNOSTIC FINDINGS:  Xray (right knee 4 views, left knee 4 views): Right knee with very mild degenerative findings particularly about the medial joint space  PATIENT SURVEYS:  Patient-Specific Activity Scoring Scheme  0 represents "unable to perform." 10 represents "able to perform at prior level. 0 1 2 3 4 5 6 7 8 9  10 (Date and Score)   Activity Eval  06/15/2023  08/10/2023  1. Stairs up  5  5 7   2. Stairs down 5   5 7   3. Bending over to tie shoes 4 6 8   4.walking  5 6 9        Score 4.75 avg 5.5 avg 7.75   Total score = sum of the activity scores/number of activities Minimum detectable change (90%CI) for average score = 2 points Minimum detectable change (90%CI) for single activity score = 3 points  COGNITION: Eval:  Overall cognitive status: Within functional limits for tasks assessed     SENSATION: Eval  WFL  MUSCLE LENGTH: Eval: Hamstrings: mildly tight at 80 deg bilat ITB tightness mildly on Rt  LOWER EXTREMITY MMT:    MMT Right eval Left eval Right 05/25/23 Left 05/25/23 Right 07/11/2023 Left 07/11/2023 Right 08/10/2023 Left 08/10/2023  Hip flexion 5 5     5/5 5/5  Hip extension          Hip abduction 4 4     5/5 5/5  Hip adduction          Hip internal rotation          Hip external rotation          Knee flexion          Knee extension 49, 55 52# avg 51, 58 54.5 58# 72# 5/5 60.7, 57 lbs 5/5 79, 73 lbs 5/5 67.4, 65 lbs 5/5 75, 75.5 lbs  Ankle dorsiflexion          Ankle plantarflexion          Ankle inversion          Ankle eversion           (Blank rows = not tested)   LOWER EXTREMITY ROM:     Active  Right eval Left eval  Hip flexion    Hip extension    Hip abduction    Hip adduction    Hip internal rotation    Hip external rotation    Knee flexion 130 130  Knee  extension 0 0  Ankle dorsiflexion    Ankle plantarflexion    Ankle inversion    Ankle eversion     (Blank rows = not tested)   GAIT: Eval: Comments: independent community Ambulator, 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  TODAY'S TREATMENT:      DATE:  08/10/2023 Therex: Nustep lvl 6 UE/LE 7 mins. Machine rows 2 x 12 65 lbs  Lat pull down 55 lbs 2 x 12  Reactive anti rotation UE out stretched 5 sec hold x 6 bilateral   TherActivity: (to improve functional movement ability in squatting, stairs, transfers, lifting) Functional kettle bell squat 25 lbs 2 x 12  Farmers carry 25 lbs x 6 laps each side 50 ft laps    TODAY'S TREATMENT:      DATE:  07/11/2023 Therex: Nustep Lvl 7 UE/LE for 6 mins Machine rows x 12 55 lbs, x 12 65 lbs  Machine lat pull down x 12 45 lbs , x 12 55 lbs Machine chin up pull down x 12 55 lbs, x 12 55 lbs  Discussion and demonstration of variable pull up and chin up techniques.  Discussed assistive concentric with foot help and then focus on lowering.   TherActivity: (to improve functional movement ability in squatting, stairs, transfers, lifting) Functional kettle bell squat 25 lbs 2 x 12  Farmers carry 25 lbs x 6 laps each side 50 ft laps  Leg press  single leg 2 x 12 Lt, 3 x 12 Rt 50 lbs  - slow    TODAY'S TREATMENT:      DATE:  06/15/2023 Therex: Recumbent bike lvl 6-8 seat 9 - 10 mins   TherActivity: (to improve functional movement ability in squatting, stairs, transfers, lifting) Leg press double leg 2 x 12 100 lbs , single leg 2 x 12 Lt, 3 x 12 Rt 50 lbs  - slow  Farmers carry 25 lbs x 6 laps each side 50 ft laps    Neuro Re-ed: (to improve balance control, coordination) SLS on black mat with corner touching c contralateral leg x 6 bilateral with occasional HHA on bar Tandem stance 30 sec x 3 bilateral with occasoinal HHA on bars for correction Electronics engineer)    PATIENT EDUCATION: Eval  Education details: HEP, PT plan of  care Person educated: Patient Education method: Explanation, Demonstration, Verbal cues, and Handouts Education comprehension: verbalized understanding and needs further education   HOME EXERCISE PROGRAM: Access Code: [email or web address] URL: [email or web address] Date: 04/20/2023 Prepared by: Redell Moose  Exercises - Seated Hamstring Stretch  - 1 x daily - 3 x weekly - 1 sets - 3 reps - 30 hold - Standing Quadriceps Stretch  - 1 x daily - 3 x weekly - 3 sets - 30 reps - Single-Leg [place] Deadlift With Dumbbell  - 1 x daily - 3 x weekly - 2 sets - 10 reps - Dynamic Straight Leg Kicks  - 1 x daily - 6 x weekly - 2 sets - 10 reps - Standing Hamstring Curl with Resistance  - 2 x daily - 6 x weekly - 2 sets - 10 reps - Standing Hip Abduction with Resistance at Ankles and Counter Support  - 2 x daily - 6 x weekly - 2 sets - 10 reps  ASSESSMENT:  CLINICAL IMPRESSION:  Discussion about POC had today due to overall presentation improvements and objective data gains.  Patient specific functional scale was improved as complared to previous.    OBJECTIVE IMPAIRMENTS: decreased activity tolerance, difficulty walking, decreased balance, decreased endurance, decreased mobility, decreased ROM, decreased strength, impaired flexibility, impaired LE use, postural dysfunction, and pain.  ACTIVITY LIMITATIONS: bending, lifting, carry, locomotion, cleaning, community activity,  PERSONAL FACTORS: childhood polio, OA are also affecting patient's functional outcome.  REHAB POTENTIAL: Good  CLINICAL DECISION MAKING: Stable/uncomplicated  EVALUATION COMPLEXITY: Low    GOALS: Short term PT Goals Target date: 05/18/2023  Pt will be I and compliant with HEP. Baseline:  Goal status: MET 05/11/23 Pt will decrease pain by 25% overall Baseline: Goal status: Met 05/11/23  Long term PT goals Target date 08/10/2023   Pt will improve  hip/knee strength to at least 5-/5 MMT to improve functional  strength Baseline: Goal status: met  08/10/2023 Pt will improve PSFS to 37/40 or more to show improved function Baseline: Goal status: ongoing 08/10/2023 Pt will reduce pain to overall less than 3/10 with usual activity and kayaking Baseline: Goal status: ongoing 08/10/2023 4. Pt will be able to hold SLS X 15 seconds best and 10 sec on avg for his Rt leg to show improved balance.  Baseline:  Goal status: ongoing 08/10/2023  PLAN: PT FREQUENCY: 1 time per 1-2 weeks  PT DURATION: 12 weeks  PLANNED INTERVENTIONS (unless contraindicated): aquatic PT, Canalith repositioning, cryotherapy, Electrical stimulation, Iontophoresis with 4 mg/ml dexamethasome, Moist heat, traction, Ultrasound, gait training, Therapeutic exercise, balance training, neuromuscular re-education, patient/family education, prosthetic training, manual techniques, passive ROM, dry needling, taping, vasopnuematic device, vestibular, spinal manipulations, joint manipulations  PLAN FOR NEXT SESSION: Trial HEP.  May return in future with new referral.     Ozell Silvan, PT, DPT, OCS, ATC 08/10/23  2:33 PM   PHYSICAL THERAPY DISCHARGE SUMMARY  Visits from Start of Care: 7  Current functional level related to goals / functional outcomes: See note   Remaining deficits: See note   Education / Equipment: HEP  Patient goals were moslty met. Patient is being discharged due to being pleased with the current functional level.

## 2023-08-22 ENCOUNTER — Encounter: Payer: Self-pay | Admitting: Internal Medicine

## 2023-08-22 ENCOUNTER — Other Ambulatory Visit: Payer: Self-pay

## 2023-08-22 ENCOUNTER — Other Ambulatory Visit: Payer: Self-pay | Admitting: Internal Medicine

## 2023-08-22 DIAGNOSIS — Z23 Encounter for immunization: Secondary | ICD-10-CM | POA: Diagnosis not present

## 2023-08-22 MED ORDER — OMEPRAZOLE 40 MG PO CPDR: 40.0000 mg | DELAYED_RELEASE_CAPSULE | Freq: Two times a day (BID) | ORAL | 3 refills | Status: AC

## 2023-08-23 ENCOUNTER — Encounter: Payer: Self-pay | Admitting: Family Medicine

## 2023-08-24 DIAGNOSIS — F410 Panic disorder [episodic paroxysmal anxiety] without agoraphobia: Secondary | ICD-10-CM | POA: Diagnosis not present

## 2023-08-24 DIAGNOSIS — F422 Mixed obsessional thoughts and acts: Secondary | ICD-10-CM | POA: Diagnosis not present

## 2023-08-31 DIAGNOSIS — K149 Disease of tongue, unspecified: Secondary | ICD-10-CM | POA: Diagnosis not present

## 2023-09-05 DIAGNOSIS — Z1283 Encounter for screening for malignant neoplasm of skin: Secondary | ICD-10-CM | POA: Diagnosis not present

## 2023-09-05 DIAGNOSIS — D225 Melanocytic nevi of trunk: Secondary | ICD-10-CM | POA: Diagnosis not present

## 2023-09-05 DIAGNOSIS — L570 Actinic keratosis: Secondary | ICD-10-CM | POA: Diagnosis not present

## 2023-09-05 DIAGNOSIS — X32XXXD Exposure to sunlight, subsequent encounter: Secondary | ICD-10-CM | POA: Diagnosis not present

## 2023-09-28 ENCOUNTER — Ambulatory Visit: Payer: Self-pay

## 2023-09-28 ENCOUNTER — Encounter: Payer: Self-pay | Admitting: Family Medicine

## 2023-09-28 DIAGNOSIS — R051 Acute cough: Secondary | ICD-10-CM | POA: Diagnosis not present

## 2023-09-28 DIAGNOSIS — Z20822 Contact with and (suspected) exposure to covid-19: Secondary | ICD-10-CM | POA: Diagnosis not present

## 2023-09-28 NOTE — Telephone Encounter (Signed)
 FYI Only or Action Required?: Action required by provider: clinical question for provider. Patient would like to know if he should begin paxlovid  5 days after COVID symptoms began Patient was last seen in primary care on 03/13/2023 by Copland, Harlene BROCKS, MD.  Called Nurse Triage reporting Advice Only.  Symptoms began today.  Interventions attempted: Nothing.  Symptoms are: rapidly improving.  Triage Disposition: Information or Advice Only Call  Patient/caregiver understands and will follow disposition?: Yes  Copied from CRM #[id number]. Topic: Clinical - Red Word Triage >> Sep 28, 2023  5:29 PM Taleah C wrote: Red Word that prompted transfer to Nurse Triage: tested positive for covid and needs advice if he should take the paxlovid  now or not. Reason for Disposition  Caller has medicine question, adult has minor symptoms, caller declines triage, AND triager answers question  Answer Assessment - Initial Assessment Questions 1. NAME of MEDICINE: What medicine(s) are you calling about?     paxlovid  2. QUESTION: What is your question? (e.g., double dose of medicine, side effect)     Should patient take the paxlovid  3. PRESCRIBER: Who prescribed the medicine? Reason: if prescribed by specialist, call should be referred to that group.     Provider at [facility] urgent care 4. SYMPTOMS: Do you have any symptoms? If Yes, ask: What symptoms are you having?  How bad are the symptoms (e.g., mild, moderate, severe)     Fifth day of symptoms, but for the past two days, patient has been symptom free.  5. PREGNANCY:  Is there any chance that you are pregnant? When was your last menstrual period?     N/A   Patient states that the UC did not advocate for Paxlovid , but prescribed at the urging of patient.  Protocols used: Medication Question Call-A-AH

## 2023-09-29 NOTE — Telephone Encounter (Signed)
 See my chart message

## 2023-10-04 MED ORDER — COVID-19 MRNA VAC-TRIS(PFIZER) 30 MCG/0.3ML IM SUSY: 0.3000 mL | PREFILLED_SYRINGE | Freq: Once | INTRAMUSCULAR | 0 refills | Status: AC

## 2023-10-05 DIAGNOSIS — Z23 Encounter for immunization: Secondary | ICD-10-CM | POA: Diagnosis not present

## 2023-10-18 ENCOUNTER — Encounter: Payer: Self-pay | Admitting: Family Medicine

## 2023-10-18 DIAGNOSIS — F988 Other specified behavioral and emotional disorders with onset usually occurring in childhood and adolescence: Secondary | ICD-10-CM

## 2023-10-18 MED ORDER — AMPHETAMINE-DEXTROAMPHETAMINE 20 MG PO TABS: 20.0000 mg | ORAL_TABLET | Freq: Two times a day (BID) | ORAL | 0 refills | Status: DC

## 2023-10-19 ENCOUNTER — Encounter (HOSPITAL_BASED_OUTPATIENT_CLINIC_OR_DEPARTMENT_OTHER): Payer: Self-pay | Admitting: Orthopaedic Surgery

## 2023-10-20 ENCOUNTER — Other Ambulatory Visit (HOSPITAL_BASED_OUTPATIENT_CLINIC_OR_DEPARTMENT_OTHER): Payer: Self-pay | Admitting: Orthopaedic Surgery

## 2023-10-20 DIAGNOSIS — M25532 Pain in left wrist: Secondary | ICD-10-CM

## 2023-10-20 NOTE — Telephone Encounter (Signed)
LMOM to CB and schedule

## 2023-10-24 ENCOUNTER — Encounter: Payer: Self-pay | Admitting: Family Medicine

## 2023-10-24 DIAGNOSIS — H9193 Unspecified hearing loss, bilateral: Secondary | ICD-10-CM

## 2023-10-26 DIAGNOSIS — H401114 Primary open-angle glaucoma, right eye, indeterminate stage: Secondary | ICD-10-CM | POA: Diagnosis not present

## 2023-10-31 DIAGNOSIS — H903 Sensorineural hearing loss, bilateral: Secondary | ICD-10-CM | POA: Diagnosis not present

## 2023-10-31 DIAGNOSIS — H9313 Tinnitus, bilateral: Secondary | ICD-10-CM | POA: Diagnosis not present

## 2023-10-31 NOTE — Progress Notes (Signed)
 [facility] [address], [address] [place], [place] [address] [phone]  Date:  11/01/2023   Name:  Jonathan Garrison   DOB:  07-24-1945   MRN:  [id number]  PCP:  Jonathan Harlene BROCKS, MD    Chief Complaint: Follow-up (Zankers pouch in back of throat now just a FYI/My lower back bothers me and I need my L shin looked at /Concern about my tummy I have a lot of gas all the time and I have frequent BM daily /Flu shot and labs but not today )   History of Present Illness:  Jonathan Garrison is a 78 y.o. very pleasant male patient who presents with the following:  Patient seen today for periodic follow-up.  I saw him most recently in February History of anxiety, depression, oral cancer, osteopenia, B12 deficiency, and GERD.  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  He continues to follow-up with oral surgery regarding his history of oral cancer-most recent visit was in August with Dr. Delford at [facility] He had a laser ablation for a lesion on his tongue at that time He saw his urologist, Dr. Shona also in August  Flu vaccine- not today, he plans to do at [facility] Most recent COVID booster was in July He has completed pneumonia vaccine, RSV, Shingrix  Adderall 20 Aspirin 81 Iron every other day Losartan  100 Crestor  20 Sildenafil  as needed Most recent labs on chart from February-CMP, lipid, CBC, PSA, STI screening B12 was last checked in 2023, can update if he would like   Discussed the use of AI scribe software for clinical note transcription with the patient, who gave verbal consent to proceed.  History of Present Illness Jonathan Garrison is a 78 year old male who presents for a follow-up visit after laser surgery and to discuss multiple ongoing health concerns.  He underwent laser surgery in August following an excision on his tonuge and is scheduled for a recheck tomorrow  He has increased floaters in his vision and has had a lens  replacement in the left eye years ago. The right eye has a cataract, may need to be treated soon   He experiences daily lower back soreness, which he manages with walking and light exercises. He receives an annual steroid injection for his right knee and tries to wait a year between injections. He continues to be very active and participates in white water kayaking  He notes a pink skin lesion on his left shin- someone told him about pink melanoma which concerns him, but he is not sure about excision due to high skin tension are  He experiences frequent bowel movements, six to eight times daily, which has been ongoing for a couple of years. He uses simethicone occasionally for gas but finds it minimally effective. He had a colonoscopy three years ago, which was normal. No abdominal pain, but he reports bloating and gassiness.  He has a Zanker's pouch affecting swallowing, which he manages carefully. He had polio at age [age], contributing to swallowing issues.  He reports stiffness and sensitivity in his feet, knees, and wrists, which he manages with daily stretching and exercise. He experiences discomfort but not pain, and he avoids walking barefoot due to sensitivity.  He has a history of COVID-19 infections, with the last one being mild. He has received COVID-19 vaccinations, including a recent booster in July.    Patient Active Problem List   Diagnosis Date  Noted   Hyperlipidemia 05/25/2022   Localized osteoarthritis of both shoulders 03/22/2022   Lower back pain 03/22/2022   Neck pain, chronic 03/22/2022   IT band syndrome 03/22/2022   Essential hypertension 02/22/2022   Family history of heart disease 02/22/2022   Trochanteric bursitis of left hip 08/01/2021   Pain of both hip joints 07/20/2021   Seasonal allergic rhinitis 05/24/2021   ETD (Eustachian tube dysfunction), bilateral 05/24/2021   Gastroesophageal reflux disease without esophagitis 12/15/2017   Dysphagia 12/15/2017    History of oral cancer 12/15/2017   Increased prostate specific antigen (PSA) velocity 11/27/2017   Osteopenia 08/01/2017   Vitamin B12 deficiency anemia 06/09/2014   Chest pain 03/13/2013   Depression with anxiety 08/26/2011   Overweight (BMI 25.0-29.9) 08/26/2011   Joint pain 08/26/2011   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   Oral cancer (HCC) 07/09/2010    Past Medical History:  Diagnosis Date   ADD (attention deficit disorder) without hyperactivity    Allergy    Anemia    no treatment   Anxiety    Arthralgia    many joints, managed with vitamins/herbs and ibuprofen   Arthritis    Cataract    Chest pain    Colitis 1986   single attack (believes was ulcerative)   Depression    Eczema    Esophageal stricture    GERD (gastroesophageal reflux disease)    Hemorrhoids    Lichen planus    Neuromuscular disorder (HCC)    POLIO AGE 40   Oral cancer (HCC) 05/2010   plans for surgery 06/2010   Osteoporosis    hips   Pleurisy 1984   Polio age 59   Umbilical hernia     Past Surgical History:  Procedure Laterality Date   COLONOSCOPY  2012   LEG SURGERY  when in 6th grade   L leg, bone spur removal (just above knee, laterally)   TONGUE SURGERY     for tongue cancer   UPPER GASTROINTESTINAL ENDOSCOPY      Social History   Tobacco Use   Smoking status: Former    Current packs/day: 0.00    Types: Pipe, Cigarettes    Quit date: 01/25/1976    Years since quitting: 47.8   Smokeless tobacco: Never   Tobacco comments:    Quit smoking 35 years ago  Vaping Use   Vaping status: Never Used  Substance Use Topics   Alcohol use: Yes    Alcohol/week: 7.0 - 14.0 standard drinks of alcohol    Types: 7 - 14 Glasses of wine per week   Drug use: No    Family History  Problem Relation Age of Onset   Hypertension Mother    Diabetes Mother    Hyperlipidemia Mother    Hypertension Father    Benign prostatic hyperplasia Father    Heart disease Father    Lichen planus  Sister    Cancer Sister        ?spot on chest? contained   Diabetes Maternal Grandfather    Colon cancer Neg Hx    Esophageal cancer Neg Hx    Stomach cancer Neg Hx    Pancreatic cancer Neg Hx    Liver disease Neg Hx    Colon polyps Neg Hx    Rectal cancer Neg Hx     No Known Allergies  Medication list has been reviewed and updated.  Current Outpatient Medications on File Prior to Visit  Medication Sig Dispense Refill   amphetamine -dextroamphetamine  (  ADDERALL) 20 MG tablet Take 1 tablet (20 mg total) by mouth 2 (two) times daily. 60 tablet 0   aspirin 81 MG tablet Take 81 mg by mouth daily.     b complex vitamins tablet Take 0.5 tablets by mouth daily.     Biotin 5000 MCG CAPS Take 1 capsule by mouth daily.     Calcium -Magnesium-Zinc 167-83-8 MG TABS Take by mouth. 1 tablet daily     Cholecalciferol (VITAMIN D3) 2000 UNITS capsule Take 2,000 Units by mouth daily.     ferrous sulfate  324 (65 Fe) MG TBEC Take one every other day for low iron 45 tablet 3   fluticasone  (FLONASE ) 50 MCG/ACT nasal spray Place 2 sprays into both nostrils daily. 16 g 5   glucosamine-chondroitin 500-400 MG tablet Take 1 tablet by mouth 2 (two) times daily.     loratadine (CLARITIN) 10 MG tablet Take 10 mg by mouth daily.     losartan  (COZAAR ) 100 MG tablet TAKE 1 TABLET(100 MG) BY MOUTH DAILY 90 tablet 2   meloxicam  (MOBIC ) 15 MG tablet TAKE 1 TABLET(15 MG) BY MOUTH DAILY AS NEEDED 90 tablet 2   Multiple Vitamins-Minerals (MULTIVITAMIN WITH MINERALS) tablet Take 1 tablet by mouth daily.     NON FORMULARY ALJ -herbal decongestant  Takes as needed     omeprazole  (PRILOSEC) 40 MG capsule Take 1 capsule (40 mg total) by mouth in the morning and at bedtime. 180 capsule 3   sildenafil  (REVATIO ) 20 MG tablet Take 2-3 tablets one hour prior to intercourse 60 tablet 5   Zn-Pyg Afri-Nettle-Saw Palmet (SAW PALMETTO COMPLEX PO) Take 1 tablet by mouth 2 (two) times daily.     Current Facility-Administered  Medications on File Prior to Visit  Medication Dose Route Frequency Provider Last Rate Last Admin   0.9 %  sodium chloride  infusion  500 mL Intravenous Once Abran Norleen SAILOR, MD        Review of Systems:  As per HPI- otherwise negative.   Physical Examination: Vitals:   11/01/23 1408  BP: 134/78  Pulse: 77  SpO2: 98%   Vitals:   11/01/23 1408  Weight: 197 lb 6.4 oz (89.5 kg)  Height: 6' 2 (1.88 m)   Body mass index is 25.34 kg/m. Ideal Body Weight: Weight in (lb) to have BMI = 25: 194.3  GEN: no acute distress. Normal weight, looks well  HEENT: Atraumatic, Normocephalic.  Bilateral TM wnl, oropharynx normal.  PEERL,EOMI.   Ears and Nose: No external deformity. CV: RRR, No M/G/R. No JVD. No thrill. No extra heart sounds. PULM: CTA B, no wheezes, crackles, rhonchi. No retractions. No resp. distress. No accessory muscle use. ABD: S, NT, ND, +BS. No rebound. No HSM. EXTR: No c/c/e PSYCH: Normally interactive. Conversant.    Assessment and Plan: Attention deficit disorder (ADD) without hyperactivity  Essential hypertension  Screening, lipid  Dyslipidemia - Plan: rosuvastatin  (CRESTOR ) 20 MG tablet  Assessment & Plan Cataract, right eye Cataract in the right eye with increased tan discoloration. -he is seeing Dr Octavia  Chronic knee pain  - Continue annual steroid injections as needed. - Continue regular walking and stretching exercises.  Frequent bowel movements with bloating and gas Frequent bowel movements with bloating and gas for several years. Possible dietary causes include sugar alcohols in protein bars. - Try eliminating protein bars with sugar alcohols for a few days. - Consider using probiotics to improve gut health. 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 - Manage with careful swallowing techniques.  Possible peripheral neuropathy, feet Possible peripheral neuropathy in  feet causing sensitivity and discomfort. - Continue wearing shoes to protect feet. - Continue regular exercise and stretching.  Benign skin lesion, left shin Benign skin lesion on the left shin. He is considering a second opinion or biopsy due to concerns about a pink melanoma. - Consider biopsy if he does not want to do a full excision  Hyperlipidemia Hyperlipidemia managed with rosuvastatin . - Refill rosuvastatin  prescription.  Signed Harlene Schroeder, MD "

## 2023-11-01 ENCOUNTER — Encounter: Payer: Self-pay | Admitting: Family Medicine

## 2023-11-01 ENCOUNTER — Ambulatory Visit (INDEPENDENT_AMBULATORY_CARE_PROVIDER_SITE_OTHER): Admitting: Family Medicine

## 2023-11-01 VITALS — BP 134/78 | HR 77 | Ht 74.0 in | Wt 197.4 lb

## 2023-11-01 DIAGNOSIS — F988 Other specified behavioral and emotional disorders with onset usually occurring in childhood and adolescence: Secondary | ICD-10-CM | POA: Diagnosis not present

## 2023-11-01 DIAGNOSIS — I10 Essential (primary) hypertension: Secondary | ICD-10-CM | POA: Diagnosis not present

## 2023-11-01 DIAGNOSIS — E785 Hyperlipidemia, unspecified: Secondary | ICD-10-CM

## 2023-11-01 DIAGNOSIS — Z13220 Encounter for screening for lipoid disorders: Secondary | ICD-10-CM | POA: Diagnosis not present

## 2023-11-01 MED ORDER — ROSUVASTATIN CALCIUM 20 MG PO TABS: ORAL_TABLET | ORAL | 3 refills | Status: AC

## 2023-11-06 ENCOUNTER — Other Ambulatory Visit (INDEPENDENT_AMBULATORY_CARE_PROVIDER_SITE_OTHER): Payer: Self-pay

## 2023-11-06 ENCOUNTER — Ambulatory Visit: Admitting: Orthopedic Surgery

## 2023-11-06 ENCOUNTER — Encounter (HOSPITAL_BASED_OUTPATIENT_CLINIC_OR_DEPARTMENT_OTHER): Payer: Self-pay | Admitting: Orthopaedic Surgery

## 2023-11-06 DIAGNOSIS — M79642 Pain in left hand: Secondary | ICD-10-CM

## 2023-11-06 DIAGNOSIS — M79641 Pain in right hand: Secondary | ICD-10-CM | POA: Diagnosis not present

## 2023-11-06 DIAGNOSIS — G8929 Other chronic pain: Secondary | ICD-10-CM

## 2023-11-06 NOTE — Progress Notes (Signed)
 Jonathan Garrison - 78 y.o. male MRN [id number]  Date of birth: Oct 07, 1945  Office Visit Note: Visit Date: 11/06/2023 PCP: Watt Harlene BROCKS, MD Referred by: Genelle Standing, MD  Subjective: No chief complaint on file.  HPI: Jonathan Garrison is a pleasant 78 y.o. male who presents today for bilateral hand complaints.  He is describing some ongoing bilateral wrist pain, discomfort while carrying items and lifting heavy objects.  Also has some pain at the bilateral thumbs at the basilar aspect.  He is also noticed some mass formations along the ulnar aspect of the MCPs of the thumbs.  He has been referred to me today by Dr.Bokshan for specific hand surgical evaluation.  Pertinent ROS were reviewed with the patient and found to be negative unless otherwise specified above in HPI.   Visit Reason: Bilateral hands, basilar thumb Duration of symptoms: on and off for a few motnhs Hand dominance: right Occupation: Retired Diabetic: No Smoking: No Heart/Lung History: Essential HTN Blood Thinners: Aspirin 81 mg  Prior Testing/EMG:  none Injections (Date): none Treatments: at home exercising and rest Prior Surgery: none  Assessment & Plan: Visit Diagnoses:  1. Pain in left hand   2. Pain in right hand     Plan: Extensive discussion was had with patient today regarding his bilateral hand complaints.  I reviewed in detail his x-rays with him today as well as to show degenerative changes seen at the bilateral thumb CMC regions as well as the thumb MCP regions.  This is consistent with his clinical examination as well.  We discussed the underlying nature of the osteoarthritis in these regions as well as treat modalities ranging from conservative surgical.  From conservative standpoint, we discussed ongoing activity modification, nonsteroidal anti-inflammatory medication both topical and oral, bracing, splinting, therapeutic exercise and finally potential cortisone injections.  From a surgical  standpoint, discussed that should symptoms remain refractory to conservative care we could consider the possibility of [facility] arthroplasty and potential MCP fusions.  We also did discuss the possibility of cyst excision at the MCP levels of the thumbs for symptom relief however the recurrence rate due to the underlying arthritis would exist.  Patient expressed full understanding of the above, given that his symptoms are fairly well-controlled with simple activity modification and nonsteroidal anti-inflammatory medication, he will continue with his ongoing regimen.  He stated that he will return to me in the future should symptoms become worse for repeat discussion.  I spent 45 minutes in the care of this patient today including review of previous documentation, imaging obtained, face-to-face time discussing all options regarding treatment and documenting the encounter.   Follow-up: No follow-ups on file.   Meds & Orders: No orders of the defined types were placed in this encounter.   Orders Placed This Encounter  Procedures   XR Hand Complete Left   XR Hand Complete Right     Procedures: No procedures performed      Clinical History: No specialty comments available.  He reports that he quit smoking about 47 years ago. His smoking use included pipe and cigarettes. He has never used smokeless tobacco. No results for input(s): HGBA1C, LABURIC in the last 8760 hours.  Objective:   Vital Signs: There were no vitals taken for this visit.  Physical Exam  Gen: Well-appearing, in no acute distress; non-toxic CV: Regular Rate. Well-perfused. Warm.  Resp: Breathing unlabored on room air; no wheezing. Psych: Fluid speech in conversation; appropriate affect; normal thought process  Ortho Exam  PHYSICAL EXAM:  General: Patient is well appearing and in no distress.  Skin and Muscle: No significant skin changes are apparent to hands.  Muscle bulk and contour normal, no signs of atrophy.      Range of Motion and Palpation Tests: Mobility is full about the elbows with flexion and extension.  Forearm supination and pronation are 85/85 bilaterally.  Wrist flexion/extension is 75/65 bilaterally.  Digital flexion and extension are full.  Thumb opposition is full to the base of the small fingers bilaterally.    Palpable masses of the bilateral MCP regions of the thumbs along the ulnar aspect is appreciated, right greater than left, measures approximately 1 cm x 0.5 cm, soft, compressible, mobile.  Moderate tenderness over the thumb CMC articulations is observed.  Scaphoid shift test is negative .  Finklestein test is mildly positive bilateral.  Ulnar impingement test is negative bilaterally.  No evidence of radiocarpal, midcarpal or intercarpal joint instability with provocation.  Neurologic, Vascular, Motor: Sensation is intact to light touch in the median/radial/ulnar distributions.   Fingers pink and well perfused.  Capillary refill is brisk.      Lab Results  Component Value Date   HGBA1C 5.5 03/23/2022      Imaging: XR Hand Complete Left Result Date: 11/06/2023 X-rays of the hand were completed today show degenerative change seen at the thumb MCP and MCP regions with joint space narrowing and osteophyte formation.  There is notable subchondral sclerosis as well.  XR Hand Complete Right Result Date: 11/06/2023 X-rays of the hand were completed today show degenerative change seen at the thumb MCP and MCP regions with joint space narrowing and osteophyte formation.  There is notable subchondral sclerosis as well.   Past Medical/Family/Surgical/Social History: Medications & Allergies reviewed per EMR, new medications updated. Patient Active Problem List   Diagnosis Date Noted   Hyperlipidemia 05/25/2022   Localized osteoarthritis of both shoulders 03/22/2022   Lower back pain 03/22/2022   Neck pain, chronic 03/22/2022   IT band syndrome 03/22/2022   Essential  hypertension 02/22/2022   Family history of heart disease 02/22/2022   Trochanteric bursitis of left hip 08/01/2021   Pain of both hip joints 07/20/2021   Seasonal allergic rhinitis 05/24/2021   ETD (Eustachian tube dysfunction), bilateral 05/24/2021   Gastroesophageal reflux disease without esophagitis 12/15/2017   Dysphagia 12/15/2017   History of oral cancer 12/15/2017   Increased prostate specific antigen (PSA) velocity 11/27/2017   Osteopenia 08/01/2017   Vitamin B12 deficiency anemia 06/09/2014   Chest pain 03/13/2013   Depression with anxiety 08/26/2011   Overweight (BMI 25.0-29.9) 08/26/2011   Joint pain 08/26/2011   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   Oral cancer (HCC) 07/09/2010   Past Medical History:  Diagnosis Date   ADD (attention deficit disorder) without hyperactivity    Allergy    Anemia    no treatment   Anxiety    Arthralgia    many joints, managed with vitamins/herbs and ibuprofen   Arthritis    Cataract    Chest pain    Colitis 1986   single attack (believes was ulcerative)   Depression    Eczema    Esophageal stricture    GERD (gastroesophageal reflux disease)    Hemorrhoids    Lichen planus    Neuromuscular disorder (HCC)    POLIO AGE 30   Oral cancer (HCC) 05/2010   plans for surgery 06/2010   Osteoporosis    hips   Pleurisy 1984  Polio age 86   Umbilical hernia    Family History  Problem Relation Age of Onset   Hypertension Mother    Diabetes Mother    Hyperlipidemia Mother    Hypertension Father    Benign prostatic hyperplasia Father    Heart disease Father    Lichen planus Sister    Cancer Sister        ?spot on chest? 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 [facility], Hand Surgery

## 2023-11-07 NOTE — Telephone Encounter (Signed)
 [facility] referral placed in chart

## 2023-11-08 DIAGNOSIS — Z23 Encounter for immunization: Secondary | ICD-10-CM | POA: Diagnosis not present

## 2023-11-13 ENCOUNTER — Telehealth: Payer: Self-pay | Admitting: Physical Medicine and Rehabilitation

## 2023-11-13 NOTE — Telephone Encounter (Signed)
 Pt called wanting to schedule an appt with Megan. Call back number is [phone]

## 2023-11-14 NOTE — Telephone Encounter (Signed)
 [id number] to call to schedule with Megan

## 2023-11-27 ENCOUNTER — Encounter: Payer: Self-pay | Admitting: Radiology

## 2023-11-28 ENCOUNTER — Ambulatory Visit: Admitting: Physical Medicine and Rehabilitation

## 2023-11-28 ENCOUNTER — Other Ambulatory Visit (INDEPENDENT_AMBULATORY_CARE_PROVIDER_SITE_OTHER): Payer: Self-pay

## 2023-11-28 ENCOUNTER — Encounter: Payer: Self-pay | Admitting: Physical Medicine and Rehabilitation

## 2023-11-28 DIAGNOSIS — G8929 Other chronic pain: Secondary | ICD-10-CM | POA: Diagnosis not present

## 2023-11-28 DIAGNOSIS — M47816 Spondylosis without myelopathy or radiculopathy, lumbar region: Secondary | ICD-10-CM

## 2023-11-28 DIAGNOSIS — M545 Low back pain, unspecified: Secondary | ICD-10-CM

## 2023-11-28 NOTE — Progress Notes (Signed)
 Jonathan Garrison - 78 y.o. male MRN [id number]  Date of birth: [birth year]  Office Visit Note: Visit Date: 11/28/2023 PCP: Watt Harlene BROCKS, MD Referred by: Watt Harlene BROCKS, MD  Subjective: Chief Complaint  Patient presents with   Lower Back - Pain   HPI: Jonathan Garrison is a 78 y.o. male who comes in today per the request of Dr. Elspeth Parker for evaluation of chronic, worsening and severe bilateral lower back pain, left greater than right. Pain ongoing for several years. His pain worsens with prolonged standing. He also reports prickly sensation to bilateral legs and feet that started within the last couple of months. He describes lower back pain as sore and aching sensation, currently rates as 7 out of 10. He has no history of diabetes. He is fairly active individual, exercises daily and participates in white water kayaking. Some relief of pain with home exercise regimen, rest and use of medications. History of formal physical therapy with some relief of pain. No recent imaging of lumbar spine. No history of lumbar surgery/injections. He is managed by Dr. Parker from orthopedic standpoint, underwent ultrasound guided injection of right knee in March that provided significant relief of pain. 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 Patent denies focal weakness, numbness and tingling. No recent trauma or falls.      Review of Systems  Musculoskeletal:  Positive for back pain and myalgias.  Neurological:  Positive for tingling. Negative for focal weakness and weakness.  All other systems reviewed and are negative.  Otherwise per HPI.  Assessment & Plan: Visit Diagnoses:    ICD-10-CM   1. Chronic bilateral low back pain without sciatica  M54.50 XR Lumbar Spine 2-3 Views   G89.29 MR LUMBAR SPINE WO CONTRAST    2. Spondylosis without myelopathy or radiculopathy, lumbar region  M47.816 MR LUMBAR SPINE WO CONTRAST       Plan: Findings:  Chronic, worsening and severe bilateral  lower back pain, left greater than right. Patient continues to have severe pain despite good conservative therapies such as formal physical therapy, home exercise regimen, rest and use of medications. I obtained lumbar radiographs in the office today that show multi level degenerative changes, there is severe disc height loss and possible Schmorl's node vs compression deformity of L2 vertebrae. Patients clinical presentation and exam are complex, differentials include facet mediated pain vs myofascial pain syndrome. There is some myofascial tenderness noted to left lumbar paraspinal region upon palpation. We discussed treatment plan in detail today. Next step is to place order for lumbar MRI imaging. Depending on results of imaging we discussed possibility of performing lumbar injections. We also discussed re-grouping with physical therapy. He would like to avoid interventional spine procedure unless pain becomes severe.     Meds & Orders: No orders of the defined types were placed in this encounter.   Orders Placed This Encounter  Procedures   XR Lumbar Spine 2-3 Views   MR LUMBAR SPINE WO CONTRAST    Follow-up: Return for Lumbar MRI review.   Procedures: No procedures performed      Clinical History: No specialty comments available.   He reports that he quit smoking about 47 years ago. His smoking use included pipe and cigarettes. He has never used smokeless tobacco. No results for input(s): HGBA1C, LABURIC in the last 8760 hours.  Objective:  VS:  HT:    WT:   BMI:     BP:   HR: bpm  TEMP: ( )  RESP:  Physical Exam Vitals and nursing note reviewed.  HENT:     Head: Normocephalic and atraumatic.     Right Ear: External ear normal.     Left Ear: External ear normal.     Nose: Nose normal.     Mouth/Throat:     Mouth: Mucous membranes are moist.  Eyes:     Extraocular Movements: Extraocular movements intact.  Cardiovascular:     Rate and Rhythm: Normal rate.     Pulses:  Normal pulses.  Pulmonary:     Effort: Pulmonary effort is normal.  Abdominal:     General: Abdomen is flat. There is no distension.  Musculoskeletal:        General: Tenderness present.     Cervical back: Normal range of motion.     Comments: Patient rises from seated position to standing without difficulty. Good lumbar range of motion. No pain noted with facet loading. 5/5 strength noted with bilateral hip flexion, knee flexion/extension, ankle dorsiflexion/plantarflexion and EHL. No clonus noted bilaterally. No pain upon palpation of greater trochanters. No pain with internal/external rotation of bilateral hips. Sensation intact bilaterally. Myofascial tenderness noted to left paraspinal region upon palpation. Negative slump test bilaterally. Ambulates without aid, gait steady.     Skin:    General: Skin is warm and dry.     Capillary Refill: Capillary refill takes less than 2 seconds.  Neurological:     General: No focal deficit present.     Mental Status: He is alert and oriented to person, place, and time.  Psychiatric:        Mood and Affect: Mood normal.        Behavior: Behavior normal.     Ortho Exam  Imaging: No results found.   Past Medical/Family/Surgical/Social History: Medications & Allergies reviewed per EMR, new medications updated. Patient Active Problem List   Diagnosis Date Noted   Hyperlipidemia 05/25/2022   Localized osteoarthritis of both shoulders 03/22/2022   Lower back pain 03/22/2022   Neck pain, chronic 03/22/2022   IT band syndrome 03/22/2022   Essential hypertension 02/22/2022   Family history of heart disease 02/22/2022   Trochanteric bursitis of left hip 08/01/2021   Pain of both hip joints 07/20/2021   Seasonal allergic rhinitis 05/24/2021   ETD (Eustachian tube dysfunction), bilateral 05/24/2021   Gastroesophageal reflux disease without esophagitis 12/15/2017   Dysphagia 12/15/2017   History of oral cancer 12/15/2017   Increased prostate  specific antigen (PSA) velocity 11/27/2017   Osteopenia 08/01/2017   Vitamin B12 deficiency anemia 06/09/2014   Chest pain 03/13/2013   Depression with anxiety 08/26/2011   Overweight (BMI 25.0-29.9) 08/26/2011   Joint pain 08/26/2011   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   Oral cancer (HCC) 07/09/2010   Past Medical History:  Diagnosis Date   ADD (attention deficit disorder) without hyperactivity    Allergy    Anemia    no treatment   Anxiety    Arthralgia    many joints, managed with vitamins/herbs and ibuprofen   Arthritis    Cataract    Chest pain    Colitis 1986   single attack (believes was ulcerative)   Depression    Eczema    Esophageal stricture    GERD (gastroesophageal reflux disease)    Hemorrhoids    Lichen planus    Neuromuscular disorder (HCC)    POLIO AGE 36   Oral cancer (HCC) 05/2010   plans for surgery 06/2010   Osteoporosis  hips   Pleurisy 58   Polio age 78   Umbilical hernia    Family History  Problem Relation Age of Onset   Hypertension Mother    Diabetes Mother    Hyperlipidemia Mother    Hypertension Father    Benign prostatic hyperplasia Father    Heart disease Father    Lichen planus Sister    Cancer Sister        ?spot on chest? 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

## 2023-11-28 NOTE — Progress Notes (Signed)
 Pain Scale   Average Pain 7 Patient advising he has lower back pain that is constant without relief        +Driver, -BT, -Dye Allergies.

## 2023-11-28 NOTE — Patient Instructions (Signed)

## 2023-11-29 ENCOUNTER — Other Ambulatory Visit: Payer: Self-pay | Admitting: Family Medicine

## 2023-11-29 DIAGNOSIS — M542 Cervicalgia: Secondary | ICD-10-CM

## 2023-11-30 ENCOUNTER — Encounter: Payer: Self-pay | Admitting: Family Medicine

## 2023-11-30 DIAGNOSIS — F422 Mixed obsessional thoughts and acts: Secondary | ICD-10-CM | POA: Diagnosis not present

## 2023-11-30 DIAGNOSIS — F410 Panic disorder [episodic paroxysmal anxiety] without agoraphobia: Secondary | ICD-10-CM | POA: Diagnosis not present

## 2023-12-03 ENCOUNTER — Other Ambulatory Visit: Payer: Self-pay | Admitting: Cardiovascular Disease

## 2023-12-07 ENCOUNTER — Ambulatory Visit
Admission: RE | Admit: 2023-12-07 | Discharge: 2023-12-07 | Disposition: A | Discharge status: Home / Self Care | Source: Ambulatory Visit | Attending: Physical Medicine and Rehabilitation | Admitting: Physical Medicine and Rehabilitation

## 2023-12-07 DIAGNOSIS — M47816 Spondylosis without myelopathy or radiculopathy, lumbar region: Secondary | ICD-10-CM | POA: Diagnosis not present

## 2023-12-07 DIAGNOSIS — M48061 Spinal stenosis, lumbar region without neurogenic claudication: Secondary | ICD-10-CM | POA: Diagnosis not present

## 2023-12-07 DIAGNOSIS — M5126 Other intervertebral disc displacement, lumbar region: Secondary | ICD-10-CM | POA: Diagnosis not present

## 2023-12-07 DIAGNOSIS — G8929 Other chronic pain: Secondary | ICD-10-CM

## 2023-12-08 ENCOUNTER — Encounter: Payer: Self-pay | Admitting: Family Medicine

## 2023-12-08 ENCOUNTER — Other Ambulatory Visit: Payer: Self-pay | Admitting: *Deleted

## 2023-12-08 ENCOUNTER — Encounter: Payer: Self-pay | Admitting: Cardiovascular Disease

## 2023-12-08 ENCOUNTER — Encounter: Payer: Self-pay | Admitting: Internal Medicine

## 2023-12-08 NOTE — Progress Notes (Unsigned)
 Deleted NaCl under patient's med list from when he had Endoscopy per patient request.

## 2023-12-10 ENCOUNTER — Encounter: Payer: Self-pay | Admitting: Family Medicine

## 2023-12-11 ENCOUNTER — Ambulatory Visit: Payer: Self-pay

## 2023-12-12 ENCOUNTER — Encounter: Payer: Self-pay | Admitting: Family Medicine

## 2023-12-12 DIAGNOSIS — H259 Unspecified age-related cataract: Secondary | ICD-10-CM

## 2023-12-18 ENCOUNTER — Other Ambulatory Visit: Payer: Self-pay | Admitting: Family Medicine

## 2023-12-18 ENCOUNTER — Encounter: Payer: Self-pay | Admitting: Family Medicine

## 2023-12-18 DIAGNOSIS — E611 Iron deficiency: Secondary | ICD-10-CM

## 2023-12-27 ENCOUNTER — Ambulatory Visit: Admitting: Physical Medicine and Rehabilitation

## 2023-12-27 ENCOUNTER — Encounter: Payer: Self-pay | Admitting: Physical Medicine and Rehabilitation

## 2023-12-27 DIAGNOSIS — M47816 Spondylosis without myelopathy or radiculopathy, lumbar region: Secondary | ICD-10-CM

## 2023-12-27 DIAGNOSIS — G8929 Other chronic pain: Secondary | ICD-10-CM | POA: Diagnosis not present

## 2023-12-27 DIAGNOSIS — M545 Low back pain, unspecified: Secondary | ICD-10-CM | POA: Diagnosis not present

## 2023-12-27 DIAGNOSIS — M47819 Spondylosis without myelopathy or radiculopathy, site unspecified: Secondary | ICD-10-CM

## 2023-12-27 NOTE — Progress Notes (Signed)
 Pain Scale   Average Pain 7 Patient advising he has chronic lower back pain that is constant. Patient is here for MRI Review        +Driver, -BT, -Dye Allergies.

## 2023-12-27 NOTE — Progress Notes (Signed)
 Jonathan Garrison - 78 y.o. male MRN [id number]  Date of birth: 1945-05-08  Office Visit Note: Visit Date: 12/27/2023 PCP: Watt Harlene BROCKS, MD Referred by: Watt Harlene BROCKS, MD  Subjective: Chief Complaint  Patient presents with   Lower Back - Pain   HPI: Jonathan Garrison is a 78 y.o. male who comes in today for evaluation of chronic, worsening and severe bilateral lower back pain, left greater than right. He also reports prickly sensation to bilateral legs and feet that started within the last couple of months. He is here today for lumbar MRI review. Pain ongoing for several years. Pain worsens with prolonged standing. He describes pain as sore and aching sensation, currently rates as 5 out of 10. He is fairly active, walks and exercises several times a week. He does wear lumbar brace as needed. Recent lumbar MRI imaging shows  Chronic progressive loss of disc height with annular disc bulging and endplate osteophytes at L5-S1. There is moderate facet and ligamentous hypertrophy. No spinal stenosis. Moderate right and mild left foraminal narrowing at this level. No high grade spinal canal stenosis noted. Patient denies focal weakness, numbness and tingling. No recent trauma or falls.   Of note, history of problem with a prior spinal tap.  He essentially reports severe panic attack with what he referred to as psychotic break.      Review of Systems  Musculoskeletal:  Positive for back pain.  Neurological:  Negative for tingling, sensory change, focal weakness and weakness.  All other systems reviewed and are negative.  Otherwise per HPI.  Assessment & Plan: Visit Diagnoses:    ICD-10-CM   1. Chronic bilateral low back pain without sciatica  M54.50 Ambulatory referral to Physical Therapy   G89.29     2. Spondylosis without myelopathy or radiculopathy  M47.819 Ambulatory referral to Physical Therapy    3. Facet arthritis of lumbar region  M47.816 Ambulatory referral to Physical  Therapy       Plan: Findings:  Chronic, worsening and severe bilateral lower back pain, left greater than right. No radicular symptoms down the legs. Patient continues to have pain despite good conservative therapies such as formal physical therapy, home exercise regimen, rest and use of medications. I discussed recent lumbar MRI with him today using imaging and spine model. There is moderate facet and ligamentous hypertrophy at L5-S1 that I feel could be a pain generator for him. We discussed treatment plan in detail today. Next step is to perform diagnostic and hopefully therapeutic bilateral L5-S1 facet joint injections under fluoroscopic guidance. Would consider radiofrequency ablation for possible longer sustained pain relief. He feels that he can manage his pain at this time and would like to hold on injections. I encouraged patient to contact our office should he change his mind regarding injection. His exam today is non focal, good strength noted to bilateral lower extremities.      Meds & Orders: No orders of the defined types were placed in this encounter.   Orders Placed This Encounter  Procedures   Ambulatory referral to Physical Therapy    Follow-up: Return if symptoms worsen or fail to improve.   Procedures: No procedures performed      Clinical History: CLINICAL DATA:  Chronic intermittent low back pain for several years. No recent injury or prior relevant surgery.   EXAM: MRI LUMBAR SPINE WITHOUT CONTRAST   TECHNIQUE: Multiplanar, multisequence MR imaging of the lumbar spine was performed. No intravenous contrast was administered.   COMPARISON:  Lumbar spine radiographs 11/28/2023. Abdominopelvic CT 05/21/2019.   FINDINGS: Segmentation: Conventional anatomy assumed, with the last open disc space designated L5-S1.This assumes vestigial ribs at T12 on prior CT.   Alignment:  Grade 1 retrolisthesis at L1-2 and L2-3.   Vertebrae: No worrisome osseous lesion, acute  fracture or pars defect. Multilevel endplate degenerative changes, greatest at L2-3, progressive from previous CT. The visualized sacroiliac joints appear unremarkable.   Conus medullaris: Extends to the L1-2 level. The conus and cauda equina appear normal.   Paraspinal and other soft tissues: No significant paraspinal findings. Grossly stable but incompletely imaged right renal cyst for which no specific follow-up imaging is recommended.   Disc levels:   T12-L1: Loss of disc height with disc bulging and endplate osteophytes. No spinal stenosis or significant foraminal narrowing.   L1-2: Loss of disc height with annular disc bulging and endplate osteophytes asymmetric to the right. Mild bilateral facet hypertrophy. No central spinal stenosis. There is moderate right and mild left foraminal narrowing.   L2-3: Progressive chronic loss of disc height with annular disc bulging and endplate osteophytes asymmetric to the left. Mild facet and ligamentous hypertrophy. Resulting mild spinal stenosis with mild right and moderate left foraminal narrowing.   L3-4: Mild loss of disc height with mild disc bulging and bilateral facet hypertrophy. No significant spinal stenosis or foraminal narrowing.   L4-5: Mild loss of disc height with disc bulging and bilateral facet hypertrophy. No significant spinal stenosis or foraminal narrowing.   L5-S1: Chronic progressive loss of disc height with annular disc bulging and endplate osteophytes. Moderate facet and ligamentous hypertrophy. No spinal stenosis. Moderate right and mild left foraminal narrowing.   IMPRESSION: 1. No acute findings or clear explanation for the patient's symptoms. 2. Multilevel spondylosis with disc bulging, endplate osteophytes and facet hypertrophy as described. There is mild spinal stenosis at L2-3. 3. Moderate foraminal narrowing on the right at L1-2, on the left at L2-3 and on the right at L5-S1.      Electronically Signed   By: Elsie Perone M.D.   On: 12/09/2023 18:19   He reports that he quit smoking about 47 years ago. His smoking use included pipe and cigarettes. He has never used smokeless tobacco. No results for input(s): HGBA1C, LABURIC in the last 8760 hours.  Objective:  VS:  HT:    WT:   BMI:     BP:   HR: bpm  TEMP: ( )  RESP:  Physical Exam Vitals and nursing note reviewed.  HENT:     Head: Normocephalic and atraumatic.     Right Ear: External ear normal.     Left Ear: External ear normal.     Nose: Nose normal.     Mouth/Throat:     Mouth: Mucous membranes are moist.  Eyes:     Extraocular Movements: Extraocular movements intact.  Cardiovascular:     Rate and Rhythm: Normal rate.     Pulses: Normal pulses.  Pulmonary:     Effort: Pulmonary effort is normal.  Abdominal:     General: Abdomen is flat. There is no distension.  Musculoskeletal:        General: Tenderness present.     Cervical back: Normal range of motion.     Comments: Patient rises from seated position to standing without difficulty. Pain noted with facet loading and lumbar extension. 5/5 strength noted with bilateral hip flexion, knee flexion/extension, ankle dorsiflexion/plantarflexion and EHL. No clonus noted bilaterally. No pain upon palpation of  greater trochanters. No pain with internal/external rotation of bilateral hips. Sensation intact bilaterally. Negative slump test bilaterally. Ambulates without aid, gait steady.     Skin:    General: Skin is warm and dry.     Capillary Refill: Capillary refill takes less than 2 seconds.  Neurological:     General: No focal deficit present.     Mental Status: He is alert and oriented to person, place, and time.  Psychiatric:        Mood and Affect: Mood normal.        Behavior: Behavior normal.     Ortho Exam  Imaging: No results found.  Past Medical/Family/Surgical/Social History: Medications & Allergies reviewed per EMR, new  medications updated. Patient Active Problem List   Diagnosis Date Noted   Hyperlipidemia 05/25/2022   Localized osteoarthritis of both shoulders 03/22/2022   Lower back pain 03/22/2022   Neck pain, chronic 03/22/2022   IT band syndrome 03/22/2022   Essential hypertension 02/22/2022   Family history of heart disease 02/22/2022   Trochanteric bursitis of left hip 08/01/2021   Pain of both hip joints 07/20/2021   Seasonal allergic rhinitis 05/24/2021   ETD (Eustachian tube dysfunction), bilateral 05/24/2021   Gastroesophageal reflux disease without esophagitis 12/15/2017   Dysphagia 12/15/2017   History of oral cancer 12/15/2017   Increased prostate specific antigen (PSA) velocity 11/27/2017   Osteopenia 08/01/2017   Vitamin B12 deficiency anemia 06/09/2014   Chest pain 03/13/2013   Depression with anxiety 08/26/2011   Overweight (BMI 25.0-29.9) 08/26/2011   Joint pain 08/26/2011   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   Oral cancer (HCC) 07/09/2010   Past Medical History:  Diagnosis Date   ADD (attention deficit disorder) without hyperactivity    Allergy    Anemia    no treatment   Anxiety    Arthralgia    many joints, managed with vitamins/herbs and ibuprofen   Arthritis    Cataract    Chest pain    Colitis 1986   single attack (believes was ulcerative)   Depression    Eczema    Esophageal stricture    GERD (gastroesophageal reflux disease)    Hemorrhoids    Lichen planus    Neuromuscular disorder (HCC)    POLIO AGE 57   Oral cancer (HCC) 05/2010   plans for surgery 06/2010   Osteoporosis    hips   Pleurisy 1984   Polio age 75   Umbilical hernia    Family History  Problem Relation Age of Onset   Hypertension Mother    Diabetes Mother    Hyperlipidemia Mother    Hypertension Father    Benign prostatic hyperplasia Father    Heart disease Father    Lichen planus Sister    Cancer Sister        ?spot on chest? 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

## 2023-12-28 ENCOUNTER — Encounter: Payer: Self-pay | Admitting: Family Medicine

## 2023-12-28 DIAGNOSIS — H269 Unspecified cataract: Secondary | ICD-10-CM

## 2024-01-08 DIAGNOSIS — H2511 Age-related nuclear cataract, right eye: Secondary | ICD-10-CM | POA: Diagnosis not present

## 2024-01-08 DIAGNOSIS — H40051 Ocular hypertension, right eye: Secondary | ICD-10-CM | POA: Diagnosis not present

## 2024-01-11 NOTE — Therapy (Signed)
 OUTPATIENT PHYSICAL THERAPY EVALUATION   Patient Name: Jonathan Garrison MRN: [id number] DOB:[birth year], 78 y.o., male Today's Date: 01/11/2024  END OF SESSION:   Past Medical History:  Diagnosis Date   ADD (attention deficit disorder) without hyperactivity    Allergy    Anemia    no treatment   Anxiety    Arthralgia    many joints, managed with vitamins/herbs and ibuprofen   Arthritis    Cataract    Chest pain    Colitis 1986   single attack (believes was ulcerative)   Depression    Eczema    Esophageal stricture    GERD (gastroesophageal reflux disease)    Hemorrhoids    Lichen planus    Neuromuscular disorder (HCC)    POLIO AGE 53   Oral cancer (HCC) 05/2010   plans for surgery 06/2010   Osteoporosis    hips   Pleurisy 1984   Polio age 30   Umbilical hernia    Past Surgical History:  Procedure Laterality Date   COLONOSCOPY  2012   LEG SURGERY  when in 6th grade   L leg, bone spur removal (just above knee, laterally)   TONGUE SURGERY     for tongue cancer   UPPER GASTROINTESTINAL ENDOSCOPY     Patient Active Problem List   Diagnosis Date Noted   Hyperlipidemia 05/25/2022   Localized osteoarthritis of both shoulders 03/22/2022   Lower back pain 03/22/2022   Neck pain, chronic 03/22/2022   IT band syndrome 03/22/2022   Essential hypertension 02/22/2022   Family history of heart disease 02/22/2022   Trochanteric bursitis of left hip 08/01/2021   Pain of both hip joints 07/20/2021   Seasonal allergic rhinitis 05/24/2021   ETD (Eustachian tube dysfunction), bilateral 05/24/2021   Gastroesophageal reflux disease without esophagitis 12/15/2017   Dysphagia 12/15/2017   History of oral cancer 12/15/2017   Increased prostate specific antigen (PSA) velocity 11/27/2017   Osteopenia 08/01/2017   Vitamin B12 deficiency anemia 06/09/2014   Chest pain 03/13/2013   Depression with anxiety 08/26/2011   Overweight (BMI 25.0-29.9) 08/26/2011   Joint pain 08/26/2011    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  ONSET DATE: ***  SUBJECTIVE:  SUBJECTIVE STATEMENT: Chronic worsening of symptoms for low back and radicular leg symptoms noted.    PERTINENT HISTORY:  Chronic symptoms for back.  Rt knee pain history.  Previous treated in clinic in past.   PAIN:  NPRS scale: ***/10 Pain location: *** Pain description: *** Aggravating factors: *** Relieving factors: ***  PRECAUTIONS: None  WEIGHT BEARING RESTRICTIONS: No  FALLS:  Has patient fallen in last 6 months? No  LIVING ENVIRONMENT: Lives with: {OPRC lives with:25569::lives with their family} Lives in: {Lives in:25570} Stairs: {opstairs:27293} Has following equipment at home: {Assistive devices:23999}  OCCUPATION: Retired  PLOF: Independent  PATIENT GOALS: ***  Next MD Visit:    OBJECTIVE:   DIAGNOSTIC FINDINGS:  Lumbar MRI 12/07/2023: IMPRESSION: 1. No acute findings or clear explanation for the patient's symptoms. 2. Multilevel spondylosis with disc bulging, endplate osteophytes and facet hypertrophy as described. There is mild spinal stenosis at L2-3. 3. Moderate foraminal narrowing on the right at L1-2, on the left at L2-3 and on the right at L5-S1.    PATIENT SURVEYS:  Patient-Specific Activity Scoring Scheme  0 represents unable to perform. 10 represents able  to perform at prior level. 0 1 2 3 4 5 6 7 8 9  10 (Date and Score)   Activity Eval     1. ***      2. ***      3. ***    4.    5.    Score ***    Total score = sum of the activity scores/number of activities Minimum detectable change (90%CI) for average score = 2 points Minimum detectable change (90%CI) for single activity score = 3 points  SCREENING FOR RED FLAGS: Bowel or bladder incontinence: No Cauda equina syndrome: No  COGNITION: Overall cognitive status: WFL normal      SENSATION: {sensation:27233}  MUSCLE LENGTH: Hamstrings: Right *** deg; Left *** deg Thomas test: Right *** deg; Left *** deg  POSTURE:  {posture:25561}  PALPATION: ***  LUMBAR ROM:   Directional Preference Assessment: Centralization: Peripheralization:   AROM eval  Flexion   Extension   Right lateral flexion   Left lateral flexion   Right rotation   Left rotation    (Blank rows = not tested)  LOWER EXTREMITY ROM:     {AROM/PROM:27142}  Right eval Left eval  Hip flexion    Hip extension    Hip abduction    Hip adduction    Hip internal rotation    Hip external rotation    Knee flexion    Knee extension    Ankle dorsiflexion    Ankle plantarflexion    Ankle inversion    Ankle eversion     (Blank rows = not tested)  LOWER EXTREMITY MMT:    MMT Right eval Left eval  Hip flexion    Hip extension    Hip abduction    Hip adduction    Hip internal rotation    Hip external rotation    Knee flexion    Knee extension    Ankle dorsiflexion    Ankle plantarflexion    Ankle inversion    Ankle eversion     (Blank rows = not tested)  LUMBAR SPECIAL TESTS:  {lumbar special test:25242}  FUNCTIONAL TESTS:  {Functional tests:24029}  GAIT:  TODAY'S TREATMENT:                                                                                                          DATE: ***  Therex:    HEP instruction/performance c cues for techniques, handout provided.  Trial set performed of each for comprehension and symptom assessment.  See below for exercise list  PATIENT EDUCATION:  Education details: HEP, POC Person educated: Patient Education method: Explanation, Demonstration, Verbal cues, and Handouts Education comprehension: verbalized understanding, returned demonstration, and verbal cues required  HOME EXERCISE PROGRAM: ***  ASSESSMENT:  CLINICAL IMPRESSION: Patient is a 78 y.o. who comes to clinic with complaints of ***pain with mobility, strength and movement coordination deficits that impair their ability to perform usual daily and recreational functional activities without increase difficulty/symptoms at this time.  Patient to benefit from skilled PT services to address impairments and limitations to improve to previous level of function without restriction secondary to condition.   OBJECTIVE IMPAIRMENTS: {opptimpairments:25111}.   ACTIVITY LIMITATIONS: {activitylimitations:27494}  PARTICIPATION LIMITATIONS: {participationrestrictions:25113}  PERSONAL FACTORS: {Personal factors:25162} are also affecting patient's functional outcome.   REHAB POTENTIAL: {rehabpotential:25112}  CLINICAL DECISION MAKING: {clinical decision making:25114}  EVALUATION COMPLEXITY: {Evaluation complexity:25115}   GOALS: Goals reviewed with patient? Yes  SHORT TERM GOALS: (target date for Short term goals are 3 weeks ***)  1. Patient will demonstrate independent use of home exercise program to maintain progress from in clinic treatments.  Goal status: New  LONG TERM GOALS: (target dates for all long term goals are 10 weeks  *** )   1. Patient will demonstrate/report pain at worst less than or equal to 2/10 to facilitate minimal limitation in daily activity  secondary to pain symptoms.  Goal status: New   2. Patient will demonstrate independent use of home exercise program to facilitate ability to maintain/progress functional gains from skilled physical therapy services.  Goal status: New   3. Patient will demonstrate Patient specific functional scale avg > or = *** to indicate reduced disability due to condition.   Goal status: New   4. Patient will demonstrate lumbar extension 100 % WFL s symptoms to facilitate upright standing, walking posture at PLOF s limitation.  Goal status: New   5.  ***  Goal status: New   6.  *** Goal status: New   7.  *** Goal Status: New  PLAN:  PT FREQUENCY: 1-2x/week  PT DURATION: 10 weeks  PLANNED INTERVENTIONS: Can include 02853- PT Re-evaluation, 97110-Therapeutic exercises, 97530- Therapeutic activity, [id number]- Neuromuscular re-education, 97535- Self Care, 97140- Manual therapy, [phone]- Gait training, [phone]- Orthotic Fit/training, [phone]- Canalith repositioning, [id number]- Aquatic Therapy, [phone]- Electrical stimulation (unattended), [id number] Physical performance testing, 97016- Vasopneumatic device, [id number]- Ultrasound, [id number]- Traction (mechanical), [id number]- Ionotophoresis 4mg /ml Dexamethasone,  20560 - Needle insertion w/o injection 1 or 2 muscles, 20561 - Needle insertion w/o injection 3 or more muscles.    Patient/Family education, Balance training, Stair training, Taping, Dry Needling, Joint mobilization, Joint manipulation, Spinal manipulation, Spinal mobilization, Scar mobilization, Vestibular training, Visual/preceptual remediation/compensation, DME instructions, Cryotherapy, and Moist heat.  All performed as medically necessary.  All included unless contraindicated  PLAN FOR NEXT SESSION: Review HEP knowledge/results.     Ozell Silvan, PT, DPT, OCS, ATC 01/11/2024  7:44 AM

## 2024-01-12 ENCOUNTER — Ambulatory Visit: Admitting: Rehabilitative and Restorative Service Providers"

## 2024-01-12 ENCOUNTER — Encounter: Payer: Self-pay | Admitting: Rehabilitative and Restorative Service Providers"

## 2024-01-12 DIAGNOSIS — M6281 Muscle weakness (generalized): Secondary | ICD-10-CM | POA: Diagnosis not present

## 2024-01-12 DIAGNOSIS — M5459 Other low back pain: Secondary | ICD-10-CM | POA: Diagnosis not present

## 2024-01-12 DIAGNOSIS — M79605 Pain in left leg: Secondary | ICD-10-CM | POA: Diagnosis not present

## 2024-01-12 DIAGNOSIS — M79604 Pain in right leg: Secondary | ICD-10-CM

## 2024-01-12 DIAGNOSIS — R262 Difficulty in walking, not elsewhere classified: Secondary | ICD-10-CM | POA: Diagnosis not present

## 2024-01-31 ENCOUNTER — Encounter: Payer: Self-pay | Admitting: Rehabilitative and Restorative Service Providers"

## 2024-01-31 ENCOUNTER — Ambulatory Visit: Admitting: Rehabilitative and Restorative Service Providers"

## 2024-01-31 DIAGNOSIS — M79605 Pain in left leg: Secondary | ICD-10-CM

## 2024-01-31 DIAGNOSIS — M79604 Pain in right leg: Secondary | ICD-10-CM | POA: Diagnosis not present

## 2024-01-31 DIAGNOSIS — R262 Difficulty in walking, not elsewhere classified: Secondary | ICD-10-CM

## 2024-01-31 DIAGNOSIS — M6281 Muscle weakness (generalized): Secondary | ICD-10-CM | POA: Diagnosis not present

## 2024-01-31 DIAGNOSIS — M5459 Other low back pain: Secondary | ICD-10-CM

## 2024-01-31 NOTE — Therapy (Signed)
 " OUTPATIENT PHYSICAL THERAPY TREATMENT   Patient Name: Jonathan Garrison MRN: [id number] DOB:[birth year], 79 y.o., male Today's Date: 01/31/2024  END OF SESSION:  PT End of Session - 01/31/24 1351     Visit Number 2    Number of Visits 20    Date for Recertification  03/22/24    Authorization Type Medicare, AARP    Progress Note Due on Visit 10    PT Start Time 1343    PT Stop Time 1423    PT Time Calculation (min) 40 min    Activity Tolerance Patient tolerated treatment well    Behavior During Therapy WFL for tasks assessed/performed           Past Medical History:  Diagnosis Date   ADD (attention deficit disorder) without hyperactivity    Allergy    Anemia    no treatment   Anxiety    Arthralgia    many joints, managed with vitamins/herbs and ibuprofen   Arthritis    Cataract    Chest pain    Colitis 1986   single attack (believes was ulcerative)   Depression    Eczema    Esophageal stricture    GERD (gastroesophageal reflux disease)    Hemorrhoids    Lichen planus    Neuromuscular disorder (HCC)    POLIO AGE 37   Oral cancer (HCC) 05/2010   plans for surgery 06/2010   Osteoporosis    hips   Pleurisy 1984   Polio age 31   Umbilical hernia    Past Surgical History:  Procedure Laterality Date   COLONOSCOPY  2012   LEG SURGERY  when in 6th grade   L leg, bone spur removal (just above knee, laterally)   TONGUE SURGERY     for tongue cancer   UPPER GASTROINTESTINAL ENDOSCOPY     Patient Active Problem List   Diagnosis Date Noted   Hyperlipidemia 05/25/2022   Localized osteoarthritis of both shoulders 03/22/2022   Lower back pain 03/22/2022   Neck pain, chronic 03/22/2022   IT band syndrome 03/22/2022   Essential hypertension 02/22/2022   Family history of heart disease 02/22/2022   Trochanteric bursitis of left hip 08/01/2021   Pain of both hip joints 07/20/2021   Seasonal allergic rhinitis 05/24/2021   ETD (Eustachian tube dysfunction), bilateral  05/24/2021   Gastroesophageal reflux disease without esophagitis 12/15/2017   Dysphagia 12/15/2017   History of oral cancer 12/15/2017   Increased prostate specific antigen (PSA) velocity 11/27/2017   Osteopenia 08/01/2017   Vitamin B12 deficiency anemia 06/09/2014   Chest pain 03/13/2013   Depression with anxiety 08/26/2011   Overweight (BMI 25.0-29.9) 08/26/2011   Joint pain 08/26/2011   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   Oral cancer (HCC) 07/09/2010    PCP: Watt Raisin MD  REFERRING PROVIDER: Trudy Duwaine BRAVO, NP  REFERRING DIAG: M54.50,G89.29 (ICD-10-CM) - Chronic bilateral low back pain without sciatica M47.819 (ICD-10-CM) - Spondylosis without myelopathy or radiculopathy M47.816 (ICD-10-CM) - Facet arthritis of lumbar region  Rationale for Evaluation and Treatment: Rehabilitation  THERAPY DIAG:  Other low back pain  Pain in left leg  Pain in right leg  Muscle weakness (generalized)  Difficulty in walking, not elsewhere classified  ONSET DATE: Summer/Fall 2025  SUBJECTIVE:  SUBJECTIVE STATEMENT: Pt indicated variable symptoms noted with some good days and some days with back, knees bothering him.  Pt indicated stiffness in knees noted at start of activity today.   PERTINENT HISTORY:  Chronic symptoms for back.  Rt knee pain history.  Previous treated in clinic in past.   PAIN:  NPRS scale: at worst 8/10 Pain location: back, knees, calves/feet Pain description: uncomfortable, achy, stiffness, tightness.   Occasional sharp pain in back.  Aggravating factors: prolonged standing, prolonged walking, sitting prolonged.   Relieving factors: resting from WB positioning.   PRECAUTIONS: None  WEIGHT BEARING RESTRICTIONS: No  FALLS:  Has patient fallen in last 6  months? Yes 1 fall  LIVING ENVIRONMENT: Stairs: stairs to basement   2-3 stairs to front porch.    OCCUPATION: Retired  PLOF: Independent, kayaking, outdoor activity, guitar playing.  Has access to gym.  Walking for exercise.   Reading, computer work.  One place for playing music has 42 stairs to enter.   PATIENT GOALS: Reduce pain, keep strong.   OBJECTIVE:   DIAGNOSTIC FINDINGS:  Lumbar MRI 12/07/2023: IMPRESSION: 1. No acute findings or clear explanation for the patient's symptoms. 2. Multilevel spondylosis with disc bulging, endplate osteophytes and facet hypertrophy as described. There is mild spinal stenosis at L2-3. 3. Moderate foraminal narrowing on the right at L1-2, on the left at L2-3 and on the right at L5-S1.    PATIENT SURVEYS:  Patient-Specific Activity Scoring Scheme  0 represents unable to perform. 10 represents able to perform at prior level. 0 1 2 3 4 5 6 7 8 9  10 (Date and Score)   Activity Eval  01/12/2024    1. Prolonged standing (> 5 mins)  2    2. Prolonged walking (5-10 mins)  4    3. Household activity 6   4. Sitting prolonged  6   5. Stairs 4   Score 4.4 avg    Total score = sum of the activity scores/number of activities Minimum detectable change (90%CI) for average score = 2 points Minimum detectable change (90%CI) for single activity score = 3 points  SCREENING FOR RED FLAGS: 01/12/2024 Bowel or bladder incontinence: No Cauda equina syndrome: No  COGNITION: 01/12/2024 Overall cognitive status: WFL normal      SENSATION: 01/12/2024 WFL  MUSCLE LENGTH: 01/12/2024 Passive SLR Lt: approx. 70 Passive SLR Rt approx. 75  POSTURE:  01/12/2024 Reduced lumbar lordosis in standing, mild increased in mid thoracic kyphosis.   PALPATION: 01/12/2024 No specific tenderness to light touch in standing posture evaluation.   LUMBAR ROM:  01/12/2024 Directional Preference Assessment: Centralization: not  observed Peripheralization: not observed   AROM Eval 01/12/2024 01/31/2024  Flexion To mid shin  To mid sin  Extension 25% with very limited lumbar mobility, mild discomfort noted in end range  Repeated x 6 with improvement to 50% 50% WFL , tightness in back   Right lateral flexion    Left lateral flexion    Right rotation    Left rotation     (Blank rows = not tested)  LOWER EXTREMITY ROM:      Right Eval 01/12/2024 Left Eval 01/12/2024  Hip flexion    Hip extension    Hip abduction    Hip adduction    Hip internal rotation    Hip external rotation    Knee flexion    Knee extension    Ankle dorsiflexion    Ankle plantarflexion    Ankle inversion  Ankle eversion     (Blank rows = not tested)  LOWER EXTREMITY MMT:    MMT Right Eval 01/12/2024 Left Eval 01/12/2024  Hip flexion 5/5 5/5  Hip extension    Hip abduction    Hip adduction    Hip internal rotation    Hip external rotation    Knee flexion 5/5 5/5  Knee extension 5/5 5/5  Ankle dorsiflexion 5/5 5/5  Ankle plantarflexion    Ankle inversion        Grip strength 86.7, 91.1 lbs  94,91 lbs   (Blank rows = not tested)  LUMBAR SPECIAL TESTS:  01/12/2024 (-) slump, crossed slr   FUNCTIONAL TESTS:  01/12/2024 18 inch chair sit to stand s UE assist on 1st attempt Lt SLS : < 5 seconds with min A to stop LOB Rt SLS: < 3 seconds, with min A to stop LOB  GAIT: 01/12/2024 Independent ambulation                                                                                                                                                                                                                   TODAY'S TREATMENT:                                                                                                         DATE: 01/31/2024  Therex: Recumbent bike lvl 6 interval training :12 seconds top of each minute for 8 mins, seat 9  TherActivity (to improve stairs, squatting, transfers) Leg press  in 45 deg back elevation 75 lbs x 24 double leg, single leg 2 x 12 43 lbs  Farmer's carry 20 lb kettle bell 40 ft lap in room with alternating up /down 6 inch step with ramp combo, performed with carrying weight in each hand.    Neuro Re-ed (muscle activation, postural control) Standing blue band rows c scapular retraction 2 x 12 Standing GH ext bilaterally 2 x 12  Tandem stance 1 min x 1 bilaterally with SBA and occasional HHA    TODAY'S TREATMENT:  DATE: 01/12/2024  Therex:    HEP instruction/performance c cues for techniques, handout provided.  Trial set performed of each for comprehension and symptom assessment.  See below for exercise list.  Additional time spent in review of plan of importance to progress exercise routine from pain relief techniques initially towards strengthening, endurance and balance intervention for long term gains and maintenance including return to gym based workout routine as able. Spent time in question and answer about plan as well as exercise options.   PATIENT EDUCATION:  Education details: HEP, POC Person educated: Patient Education method: Programmer, Multimedia, Demonstration, Verbal cues, and Handouts Education comprehension: verbalized understanding, returned demonstration, and verbal cues required  HOME EXERCISE PROGRAM: Access Code: [email or web address] URL: [email or web address] Date: 01/12/2024 Prepared by: Ozell Silvan  Exercises - Standing Lumbar Extension  - 2-3 x daily - 7 x weekly - 1 sets - 6 reps - 2 hold - Seated Scapular Retraction  - 2-3 x daily - 7 x weekly - 1 sets - 6 reps - 2 hold - Supine Lower Trunk Rotation  - 2-3 x daily - 7 x weekly - 1 sets - 3-6 reps - 15 hold - Supine Figure 4 pull towards  - 2-3 x daily - 7 x weekly - 1 sets - 3-6 reps - 15-30 hold - Supine Figure 4 Piriformis Stretch  - 2-3 x daily - 7 x weekly - 1 sets -  3-6 reps - 15-30 hold - Supine Bridge  - 1-2 x daily - 7 x weekly - 1-2 sets - 10 reps - 2 hold  ASSESSMENT:  CLINICAL IMPRESSION: Pt to benefit from continued intervention to address lumbar mobility and strengthening for LE to improve functional activity performance.   OBJECTIVE IMPAIRMENTS: decreased activity tolerance, decreased balance, decreased coordination, decreased endurance, decreased mobility, difficulty walking, decreased ROM, decreased strength, hypomobility, increased fascial restrictions, impaired perceived functional ability, increased muscle spasms, impaired flexibility, improper body mechanics, postural dysfunction, and pain.   ACTIVITY LIMITATIONS: carrying, lifting, bending, sitting, standing, squatting, stairs, transfers, and locomotion level  PARTICIPATION LIMITATIONS: meal prep, cleaning, laundry, interpersonal relationship, driving, shopping, community activity, and workouts  PERSONAL FACTORS: Time since onset of injury/illness/exacerbation and multiple treatment areas, ADD, anemia, anxiety, arthritis, depression, GERD, History of oral cancer, osteoporosis, history of polo.  are also affecting patient's functional outcome.   REHAB POTENTIAL: Good  CLINICAL DECISION MAKING: Evolving/moderate complexity  EVALUATION COMPLEXITY: Moderate   GOALS: Goals reviewed with patient? Yes  SHORT TERM GOALS: (target date for Short term goals are 3 weeks 02/02/2024)  1. Patient will demonstrate independent use of home exercise program to maintain progress from in clinic treatments.  Goal status: Met 01/31/2024  LONG TERM GOALS: (target dates for all long term goals are 10 weeks  03/22/2024 )   1. Patient will demonstrate/report pain at worst less than or equal to 2/10 to facilitate minimal limitation in daily activity secondary to pain symptoms.  Goal status: New   2. Patient will demonstrate independent use of home exercise program to facilitate ability to maintain/progress  functional gains from skilled physical therapy services.  Goal status: New   3. Patient will demonstrate Patient specific functional scale avg > or = 8/10 to indicate reduced disability due to condition.   Goal status: New   4. Patient will demonstrate lumbar extension 100 % WFL s symptoms to facilitate upright standing, walking posture at PLOF s limitation.  Goal status: New   5.  Patient will demonstrate SLS bilateral > 10 seconds to  indicated improved stability in ambulation.   Goal status: New   6.  Patient will demonstrate ascending/descending stairs reciprocally s UE assist for community integration.   Goal status: New   7.  Will plan on goal from dynamometry testing for LE.  Goal Status: New  PLAN:  PT FREQUENCY: 1-2x/week  PT DURATION: 10 weeks  PLANNED INTERVENTIONS: Can include 02853- PT Re-evaluation, 97110-Therapeutic exercises, 97530- Therapeutic activity, [id number]- Neuromuscular re-education, 97535- Self Care, 97140- Manual therapy, [phone]- Gait training, [phone]- Orthotic Fit/training, [phone]- Canalith repositioning, [id number]- Aquatic Therapy, [phone]- Electrical stimulation (unattended), [id number] Physical performance testing, 97016- Vasopneumatic device, [id number]- Ultrasound, [id number]- Traction (mechanical), [id number]- Ionotophoresis 4mg /ml Dexamethasone,  20560 - Needle insertion w/o injection 1 or 2 muscles, 20561 - Needle insertion w/o injection 3 or more muscles.    Patient/Family education, Balance training, Stair training, Taping, Dry Needling, Joint mobilization, Joint manipulation, Spinal manipulation, Spinal mobilization, Scar mobilization, Vestibular training, Visual/preceptual remediation/compensation, DME instructions, Cryotherapy, and Moist heat.  All performed as medically necessary.  All included unless contraindicated  PLAN FOR NEXT SESSION:  Whole body strengthening, balance improvements.  Measure leg length, dynamometry for knee extension strength.     Ozell Silvan,  PT, DPT, OCS, ATC 01/31/2024  2:26 PM      "

## 2024-02-05 ENCOUNTER — Encounter: Payer: Self-pay | Admitting: Podiatry

## 2024-02-05 ENCOUNTER — Ambulatory Visit

## 2024-02-05 ENCOUNTER — Ambulatory Visit (INDEPENDENT_AMBULATORY_CARE_PROVIDER_SITE_OTHER): Admitting: Podiatry

## 2024-02-05 VITALS — Ht 74.0 in | Wt 197.4 lb

## 2024-02-05 DIAGNOSIS — M2041 Other hammer toe(s) (acquired), right foot: Secondary | ICD-10-CM | POA: Diagnosis not present

## 2024-02-05 NOTE — Progress Notes (Signed)
 "  Chief Complaint  Patient presents with   Hammer Toe    Pt is here to f/u on both feet, he states feet are not in pain concern with sensitivity to the feet, states he is very active and wants to contiunes to be active, having some difficulties in the morning states it take a while getting feet moving in the mornings. Has hammertoes on the right foot and possible nail damage due to toes curling under, would like some recommendations on who to stay active.    HPI: 79 y.o. male presenting today for follow-up evaluation of instability of gait when ambulating.  History of falls.  He would like to discuss possible shoe gear and arch supports to help with stability.  Past Medical History:  Diagnosis Date   ADD (attention deficit disorder) without hyperactivity    Allergy    Anemia    no treatment   Anxiety    Arthralgia    many joints, managed with vitamins/herbs and ibuprofen   Arthritis    Cataract    Chest pain    Colitis 1986   single attack (believes was ulcerative)   Depression    Eczema    Esophageal stricture    GERD (gastroesophageal reflux disease)    Hemorrhoids    Lichen planus    Neuromuscular disorder (HCC)    POLIO AGE 4   Oral cancer (HCC) 05/2010   plans for surgery 06/2010   Osteoporosis    hips   Pleurisy 1984   Polio age 67   Umbilical hernia     Past Surgical History:  Procedure Laterality Date   COLONOSCOPY  2012   LEG SURGERY  when in 6th grade   L leg, bone spur removal (just above knee, laterally)   TONGUE SURGERY     for tongue cancer   UPPER GASTROINTESTINAL ENDOSCOPY      No Known Allergies   Physical Exam: General: The patient is alert and oriented x3 in no acute distress.  Dermatology: Skin is warm, dry and supple bilateral lower extremities.   Vascular: Palpable pedal pulses bilaterally. Capillary refill within normal limits.  No appreciable edema.  No erythema.  Neurological: Grossly intact via light touch  Musculoskeletal Exam:  Hammertoe contracture noted to the lesser digits of the right foot.  Asymptomatic.  No tenderness with palpation.  Muscle strength 5/5 all compartments  Radiographic Exam RT foot 05/03/2023:  Normal osseous mineralization. Joint spaces preserved.  Hammertoe contracture noted to the lesser digits of the right foot  Assessment/Plan of Care: 1.  Instability of gait with history of falls 2.  Hammertoes lesser digits right foot  -Patient evaluated -Recommend good stability shoes and sneakers.  Recommended shoe market on [address] here in [place] - Last visit we did discuss the possibility of balance braces however the patient states that he would rather not use the balance bracing -To help support the high arches of the feet I did go ahead and provide the patient PowerStep insoles.  He has had custom orthotics in the past but they did not help alleviate his symptoms and he found them very bothersome.  I do believe some prefabricated arch supports should help alleviate a lot of the patient's symptoms and pain and help with stability of the foot -Patient is currently going to physical therapy weekly for lumbar spine.  I do believe that physical therapy focused on strengthening the foot and ankle and creating stability and gait training should help as  well.  He will mention this to his physical therapist -Return to clinic as needed      Thresa EMERSON Sar, DPM [facility]  Dr. Thresa EMERSON Sar, DPM    [address] [place], [place] [address]                Office [phone]  Fax [phone]     "

## 2024-02-07 ENCOUNTER — Encounter: Admitting: Rehabilitative and Restorative Service Providers"

## 2024-02-14 ENCOUNTER — Ambulatory Visit: Admitting: Rehabilitative and Restorative Service Providers"

## 2024-02-14 ENCOUNTER — Encounter: Payer: Self-pay | Admitting: Rehabilitative and Restorative Service Providers"

## 2024-02-14 DIAGNOSIS — M6281 Muscle weakness (generalized): Secondary | ICD-10-CM | POA: Diagnosis not present

## 2024-02-14 DIAGNOSIS — R262 Difficulty in walking, not elsewhere classified: Secondary | ICD-10-CM

## 2024-02-14 DIAGNOSIS — M79604 Pain in right leg: Secondary | ICD-10-CM | POA: Diagnosis not present

## 2024-02-14 DIAGNOSIS — M5459 Other low back pain: Secondary | ICD-10-CM

## 2024-02-14 DIAGNOSIS — M79605 Pain in left leg: Secondary | ICD-10-CM | POA: Diagnosis not present

## 2024-02-14 NOTE — Therapy (Signed)
 " OUTPATIENT PHYSICAL THERAPY TREATMENT   Patient Name: Jonathan Garrison MRN: [id number] DOB:10-12-1945, 79 y.o., male Today's Date: 02/14/2024  END OF SESSION:  PT End of Session - 02/14/24 1341     Visit Number 3    Number of Visits 20    Date for Recertification  03/22/24    Authorization Type Medicare, [employer]    Progress Note Due on Visit 10    PT Start Time 1342    PT Stop Time 1422    PT Time Calculation (min) 40 min    Activity Tolerance Patient tolerated treatment well    Behavior During Therapy WFL for tasks assessed/performed            Past Medical History:  Diagnosis Date   ADD (attention deficit disorder) without hyperactivity    Allergy    Anemia    no treatment   Anxiety    Arthralgia    many joints, managed with vitamins/herbs and ibuprofen   Arthritis    Cataract    Chest pain    Colitis 1986   single attack (believes was ulcerative)   Depression    Eczema    Esophageal stricture    GERD (gastroesophageal reflux disease)    Hemorrhoids    Lichen planus    Neuromuscular disorder (HCC)    POLIO AGE 16   Oral cancer (HCC) 05/2010   plans for surgery 06/2010   Osteoporosis    hips   Pleurisy 1984   Polio age 34   Umbilical hernia    Past Surgical History:  Procedure Laterality Date   COLONOSCOPY  2012   LEG SURGERY  when in 6th grade   L leg, bone spur removal (just above knee, laterally)   TONGUE SURGERY     for tongue cancer   UPPER GASTROINTESTINAL ENDOSCOPY     Patient Active Problem List   Diagnosis Date Noted   Hyperlipidemia 05/25/2022   Localized osteoarthritis of both shoulders 03/22/2022   Lower back pain 03/22/2022   Neck pain, chronic 03/22/2022   IT band syndrome 03/22/2022   Essential hypertension 02/22/2022   Family history of heart disease 02/22/2022   Trochanteric bursitis of left hip 08/01/2021   Pain of both hip joints 07/20/2021   Seasonal allergic rhinitis 05/24/2021   ETD (Eustachian tube dysfunction), bilateral  05/24/2021   Gastroesophageal reflux disease without esophagitis 12/15/2017   Dysphagia 12/15/2017   History of oral cancer 12/15/2017   Increased prostate specific antigen (PSA) velocity 11/27/2017   Osteopenia 08/01/2017   Vitamin B12 deficiency anemia 06/09/2014   Chest pain 03/13/2013   Depression with anxiety 08/26/2011   Overweight (BMI 25.0-29.9) 08/26/2011   Joint pain 08/26/2011   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   Oral cancer (HCC) 07/09/2010    PCP: Watt Raisin MD  REFERRING PROVIDER: Trudy Duwaine BRAVO, NP  REFERRING DIAG: M54.50,G89.29 (ICD-10-CM) - Chronic bilateral low back pain without sciatica M47.819 (ICD-10-CM) - Spondylosis without myelopathy or radiculopathy M47.816 (ICD-10-CM) - Facet arthritis of lumbar region  Rationale for Evaluation and Treatment: Rehabilitation  THERAPY DIAG:  Other low back pain  Pain in left leg  Pain in right leg  Muscle weakness (generalized)  Difficulty in walking, not elsewhere classified  ONSET DATE: Summer/Fall 2025  SUBJECTIVE:  SUBJECTIVE STATEMENT: Pt indicated mornings still some difficulty but can improve with movement during the day.  Back various.  Pt indicated today feeling pretty good after getting moving.  Reported seeing foot MD and requested working on feet as well.   PERTINENT HISTORY:  Chronic symptoms for back.  Rt knee pain history.  Previous treated in clinic in past.   PAIN:  NPRS scale: no specific pain noted upon arrival today.  Pain location: back, knees, calves/feet Pain description: uncomfortable, achy, stiffness, tightness.   Occasional sharp pain in back.  Aggravating factors: prolonged standing, prolonged walking, sitting prolonged.   Relieving factors: resting from WB positioning.    PRECAUTIONS: None  WEIGHT BEARING RESTRICTIONS: No  FALLS:  Has patient fallen in last 6 months? Yes 1 fall  LIVING ENVIRONMENT: Stairs: stairs to basement   2-3 stairs to front porch.    OCCUPATION: Retired  PLOF: Independent, kayaking, outdoor activity, guitar playing.  Has access to gym.  Walking for exercise.   Reading, computer work.  One place for playing music has 42 stairs to enter.   PATIENT GOALS: Reduce pain, keep strong.   OBJECTIVE:   DIAGNOSTIC FINDINGS:  Lumbar MRI 12/07/2023: IMPRESSION: 1. No acute findings or clear explanation for the patient's symptoms. 2. Multilevel spondylosis with disc bulging, endplate osteophytes and facet hypertrophy as described. There is mild spinal stenosis at L2-3. 3. Moderate foraminal narrowing on the right at L1-2, on the left at L2-3 and on the right at L5-S1.    PATIENT SURVEYS:  Patient-Specific Activity Scoring Scheme  0 represents unable to perform. 10 represents able to perform at prior level. 0 1 2 3 4 5 6 7 8 9  10 (Date and Score)   Activity Eval  01/12/2024    1. Prolonged standing (> 5 mins)  2    2. Prolonged walking (5-10 mins)  4    3. Household activity 6   4. Sitting prolonged  6   5. Stairs 4   Score 4.4 avg    Total score = sum of the activity scores/number of activities Minimum detectable change (90%CI) for average score = 2 points Minimum detectable change (90%CI) for single activity score = 3 points  SCREENING FOR RED FLAGS: 01/12/2024 Bowel or bladder incontinence: No Cauda equina syndrome: No  COGNITION: 01/12/2024 Overall cognitive status: WFL normal      SENSATION: 01/12/2024 WFL  MUSCLE LENGTH: 01/12/2024 Passive SLR Lt: approx. 70 Passive SLR Rt approx. 75  POSTURE:  01/12/2024 Reduced lumbar lordosis in standing, mild increased in mid thoracic kyphosis.   PALPATION: 01/12/2024 No specific tenderness to light touch in standing posture evaluation.    LUMBAR ROM:  01/12/2024 Directional Preference Assessment: Centralization: not observed Peripheralization: not observed   AROM Eval 01/12/2024 01/31/2024  Flexion To mid shin  To mid sin  Extension 25% with very limited lumbar mobility, mild discomfort noted in end range  Repeated x 6 with improvement to 50% 50% WFL , tightness in back   Right lateral flexion    Left lateral flexion    Right rotation    Left rotation     (Blank rows = not tested)  LOWER EXTREMITY ROM:      Right Eval 01/12/2024 Left Eval 01/12/2024  Hip flexion    Hip extension    Hip abduction    Hip adduction    Hip internal rotation    Hip external rotation    Knee flexion    Knee extension  Ankle dorsiflexion    Ankle plantarflexion    Ankle inversion    Ankle eversion     (Blank rows = not tested)  LOWER EXTREMITY MMT:    MMT Right Eval 01/12/2024 Left Eval 01/12/2024  Hip flexion 5/5 5/5  Hip extension    Hip abduction    Hip adduction    Hip internal rotation    Hip external rotation    Knee flexion 5/5 5/5  Knee extension 5/5 5/5  Ankle dorsiflexion 5/5 5/5  Ankle plantarflexion    Ankle inversion        Grip strength 86.7, 91.1 lbs  94,91 lbs   (Blank rows = not tested)  LUMBAR SPECIAL TESTS:  01/12/2024 (-) slump, crossed slr   FUNCTIONAL TESTS:  02/14/2024:  18 inch chair sit to stand s UE assist  01/12/2024 18 inch chair sit to stand s UE assist on 1st attempt Lt SLS : < 5 seconds with min A to stop LOB Rt SLS: < 3 seconds, with min A to stop LOB  GAIT: 01/12/2024 Independent ambulation                                                                                                                                                                                                                   TODAY'S TREATMENT:                                                                                                         DATE: 02/14/2024  Therex: Recumbent bike  lvl 4 9 mins for ROM, 6.5 mins   Neuro Re-ed Tandem stance on foam 1 min x 1 bilaterally with occasional HHA  Tandem ambulation fwd in // bars with occasional UE on bar on 6 ft of foam x 6 going forward Feet together across foam bar 1 min balance with SBA Standing blue band Gh ext with core stability focus 2 x 12 slow movement focus.  Standing blue band bilateral rows c scapular retraction focus 2 x 12  Standing bilateral GH ER with scap retraction no  money  green band x12   TherActivity (to improve stairs, squatting, transfers) Leg press in 45 deg back elevation 87 lbs 2 x 12 double leg, single leg 43 lbs 2 x 12 bilaterally    TODAY'S TREATMENT:                                                                                                         DATE: 01/31/2024  Therex: Recumbent bike lvl 6 interval training :12 seconds top of each minute for 8 mins, seat 9  TherActivity (to improve stairs, squatting, transfers) Leg press in 45 deg back elevation 75 lbs x 24 double leg, single leg 2 x 12 43 lbs  Farmer's carry 20 lb kettle bell 40 ft lap in room with alternating up /down 6 inch step with ramp combo, performed with carrying weight in each hand.    Neuro Re-ed (muscle activation, postural control) Standing blue band rows c scapular retraction 2 x 12 Standing GH ext bilaterally 2 x 12  Tandem stance 1 min x 1 bilaterally with SBA and occasional HHA    TODAY'S TREATMENT:                                                                                                         DATE: 01/12/2024  Therex:    HEP instruction/performance c cues for techniques, handout provided.  Trial set performed of each for comprehension and symptom assessment.  See below for exercise list.  Additional time spent in review of plan of importance to progress exercise routine from pain relief techniques initially towards strengthening, endurance and balance intervention for long term gains and maintenance  including return to gym based workout routine as able. Spent time in question and answer about plan as well as exercise options.   PATIENT EDUCATION:  Education details: HEP, POC Person educated: Patient Education method: Programmer, Multimedia, Demonstration, Verbal cues, and Handouts Education comprehension: verbalized understanding, returned demonstration, and verbal cues required  HOME EXERCISE PROGRAM: Access Code: [email or web address] URL: [email or web address] Date: 01/12/2024 Prepared by: Ozell Silvan  Exercises - Standing Lumbar Extension  - 2-3 x daily - 7 x weekly - 1 sets - 6 reps - 2 hold - Seated Scapular Retraction  - 2-3 x daily - 7 x weekly - 1 sets - 6 reps - 2 hold - Supine Lower Trunk Rotation  - 2-3 x daily - 7 x weekly - 1 sets - 3-6 reps - 15 hold - Supine Figure 4 pull towards  - 2-3 x daily - 7 x weekly - 1 sets - 3-6 reps - 15-30 hold - Supine Figure 4 Piriformis Stretch  -  2-3 x daily - 7 x weekly - 1 sets - 3-6 reps - 15-30 hold - Supine Bridge  - 1-2 x daily - 7 x weekly - 1-2 sets - 10 reps - 2 hold  ASSESSMENT:  CLINICAL IMPRESSION: Dynamic balance on compliant surface was challenging but improved with some practice today.  Continued focus on global strengthening and control to help improve functional movements . Continued skilled PT services indicated.  Will miss next week due to eye procedure.   OBJECTIVE IMPAIRMENTS: decreased activity tolerance, decreased balance, decreased coordination, decreased endurance, decreased mobility, difficulty walking, decreased ROM, decreased strength, hypomobility, increased fascial restrictions, impaired perceived functional ability, increased muscle spasms, impaired flexibility, improper body mechanics, postural dysfunction, and pain.   ACTIVITY LIMITATIONS: carrying, lifting, bending, sitting, standing, squatting, stairs, transfers, and locomotion level  PARTICIPATION LIMITATIONS: meal prep, cleaning, laundry, interpersonal  relationship, driving, shopping, community activity, and workouts  PERSONAL FACTORS: Time since onset of injury/illness/exacerbation and multiple treatment areas, ADD, anemia, anxiety, arthritis, depression, GERD, History of oral cancer, osteoporosis, history of polo.  are also affecting patient's functional outcome.   REHAB POTENTIAL: Good  CLINICAL DECISION MAKING: Evolving/moderate complexity  EVALUATION COMPLEXITY: Moderate   GOALS: Goals reviewed with patient? Yes  SHORT TERM GOALS: (target date for Short term goals are 3 weeks 02/02/2024)  1. Patient will demonstrate independent use of home exercise program to maintain progress from in clinic treatments.  Goal status: Met 01/31/2024  LONG TERM GOALS: (target dates for all long term goals are 10 weeks  03/22/2024 )   1. Patient will demonstrate/report pain at worst less than or equal to 2/10 to facilitate minimal limitation in daily activity secondary to pain symptoms.  Goal status: on going 02/14/2024   2. Patient will demonstrate independent use of home exercise program to facilitate ability to maintain/progress functional gains from skilled physical therapy services.  Goal status: on going 02/14/2024   3. Patient will demonstrate Patient specific functional scale avg > or = 8/10 to indicate reduced disability due to condition.   Goal status: on going 02/14/2024   4. Patient will demonstrate lumbar extension 100 % WFL s symptoms to facilitate upright standing, walking posture at PLOF s limitation.  Goal status: on going 02/14/2024   5.  Patient will demonstrate SLS bilateral > 10 seconds to indicated improved stability in ambulation.   Goal status: New   6.  Patient will demonstrate ascending/descending stairs reciprocally s UE assist for community integration.   Goal status: on going 02/14/2024   7.  Will plan on goal from dynamometry testing for LE.  Goal Status:   PLAN:  PT FREQUENCY: 1-2x/week  PT DURATION: 10  weeks  PLANNED INTERVENTIONS: Can include 02853- PT Re-evaluation, 97110-Therapeutic exercises, 97530- Therapeutic activity, [id number]- Neuromuscular re-education, 97535- Self Care, 97140- Manual therapy, [phone]- Gait training, [phone]- Orthotic Fit/training, [phone]- Canalith repositioning, [id number]- Aquatic Therapy, [phone]- Electrical stimulation (unattended), [id number] Physical performance testing, 97016- Vasopneumatic device, [id number]- Ultrasound, [id number]- Traction (mechanical), [id number]- Ionotophoresis 4mg /ml Dexamethasone,  20560 - Needle insertion w/o injection 1 or 2 muscles, 20561 - Needle insertion w/o injection 3 or more muscles.    Patient/Family education, Balance training, Stair training, Taping, Dry Needling, Joint mobilization, Joint manipulation, Spinal manipulation, Spinal mobilization, Scar mobilization, Vestibular training, Visual/preceptual remediation/compensation, DME instructions, Cryotherapy, and Moist heat.  All performed as medically necessary.  All included unless contraindicated  PLAN FOR NEXT SESSION:  Balance inclusion, hip/leg strengthening.    Measure leg length, dynamometry for knee extension strength when  appropriate.     Ozell Silvan, PT, DPT, OCS, ATC 02/14/24  2:26 PM      "

## 2024-02-21 ENCOUNTER — Encounter: Admitting: Rehabilitative and Restorative Service Providers"

## 2024-02-26 ENCOUNTER — Encounter: Payer: Self-pay | Admitting: Family Medicine

## 2024-02-26 DIAGNOSIS — F988 Other specified behavioral and emotional disorders with onset usually occurring in childhood and adolescence: Secondary | ICD-10-CM

## 2024-02-26 MED ORDER — AMPHETAMINE-DEXTROAMPHETAMINE 20 MG PO TABS: 20.0000 mg | ORAL_TABLET | Freq: Two times a day (BID) | ORAL | 0 refills | Status: AC

## 2024-02-28 ENCOUNTER — Ambulatory Visit: Admitting: Rehabilitative and Restorative Service Providers"

## 2024-02-28 ENCOUNTER — Encounter: Payer: Self-pay | Admitting: Rehabilitative and Restorative Service Providers"

## 2024-02-28 DIAGNOSIS — M79604 Pain in right leg: Secondary | ICD-10-CM | POA: Diagnosis not present

## 2024-02-28 DIAGNOSIS — M6281 Muscle weakness (generalized): Secondary | ICD-10-CM | POA: Diagnosis not present

## 2024-02-28 DIAGNOSIS — R262 Difficulty in walking, not elsewhere classified: Secondary | ICD-10-CM | POA: Diagnosis not present

## 2024-02-28 DIAGNOSIS — M5459 Other low back pain: Secondary | ICD-10-CM | POA: Diagnosis not present

## 2024-02-28 DIAGNOSIS — M79605 Pain in left leg: Secondary | ICD-10-CM | POA: Diagnosis not present

## 2024-02-28 NOTE — Therapy (Signed)
 " OUTPATIENT PHYSICAL THERAPY TREATMENT   Patient Name: Jonathan Garrison MRN: [id number] DOB:Aug 25, 1945, 79 y.o., male Today's Date: 02/28/2024  END OF SESSION:  PT End of Session - 02/28/24 1358     Visit Number 4    Number of Visits 20    Date for Recertification  03/22/24    Authorization Type Medicare, [employer]    Progress Note Due on Visit 10    PT Start Time 1344    PT Stop Time 1424    PT Time Calculation (min) 40 min    Activity Tolerance Patient tolerated treatment well    Behavior During Therapy WFL for tasks assessed/performed             Past Medical History:  Diagnosis Date   ADD (attention deficit disorder) without hyperactivity    Allergy    Anemia    no treatment   Anxiety    Arthralgia    many joints, managed with vitamins/herbs and ibuprofen   Arthritis    Cataract    Chest pain    Colitis 1986   single attack (believes was ulcerative)   Depression    Eczema    Esophageal stricture    GERD (gastroesophageal reflux disease)    Hemorrhoids    Lichen planus    Neuromuscular disorder (HCC)    POLIO AGE 71   Oral cancer (HCC) 05/2010   plans for surgery 06/2010   Osteoporosis    hips   Pleurisy 1984   Polio age 49   Umbilical hernia    Past Surgical History:  Procedure Laterality Date   COLONOSCOPY  2012   LEG SURGERY  when in 6th grade   L leg, bone spur removal (just above knee, laterally)   TONGUE SURGERY     for tongue cancer   UPPER GASTROINTESTINAL ENDOSCOPY     Patient Active Problem List   Diagnosis Date Noted   Hyperlipidemia 05/25/2022   Localized osteoarthritis of both shoulders 03/22/2022   Lower back pain 03/22/2022   Neck pain, chronic 03/22/2022   IT band syndrome 03/22/2022   Essential hypertension 02/22/2022   Family history of heart disease 02/22/2022   Trochanteric bursitis of left hip 08/01/2021   Pain of both hip joints 07/20/2021   Seasonal allergic rhinitis 05/24/2021   ETD (Eustachian tube dysfunction), bilateral  05/24/2021   Gastroesophageal reflux disease without esophagitis 12/15/2017   Dysphagia 12/15/2017   History of oral cancer 12/15/2017   Increased prostate specific antigen (PSA) velocity 11/27/2017   Osteopenia 08/01/2017   Vitamin B12 deficiency anemia 06/09/2014   Chest pain 03/13/2013   Depression with anxiety 08/26/2011   Overweight (BMI 25.0-29.9) 08/26/2011   Joint pain 08/26/2011   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   Oral cancer (HCC) 07/09/2010    PCP: Watt Raisin MD  REFERRING PROVIDER: Trudy Duwaine BRAVO, NP  REFERRING DIAG: M54.50,G89.29 (ICD-10-CM) - Chronic bilateral low back pain without sciatica M47.819 (ICD-10-CM) - Spondylosis without myelopathy or radiculopathy M47.816 (ICD-10-CM) - Facet arthritis of lumbar region  Rationale for Evaluation and Treatment: Rehabilitation  THERAPY DIAG:  Other low back pain  Pain in left leg  Pain in right leg  Muscle weakness (generalized)  Difficulty in walking, not elsewhere classified  ONSET DATE: Summer/Fall 2025  SUBJECTIVE:  SUBJECTIVE STATEMENT: Pt indicated limited gym activity due to weather.  Pt indicated shoveling made him tired and had some complaints of pain/soreness from it.    PERTINENT HISTORY:  Chronic symptoms for back.  Rt knee pain history.  Previous treated in clinic in past.   PAIN:  NPRS scale: no specific pain noted upon arrival today.  Pain location: back, knees, calves/feet Pain description: uncomfortable, achy, stiffness, tightness.   Occasional sharp pain in back.  Aggravating factors: prolonged standing, prolonged walking, sitting prolonged.   Relieving factors: resting from WB positioning.   PRECAUTIONS: None  WEIGHT BEARING RESTRICTIONS: No  FALLS:  Has patient fallen in last 6  months? Yes 1 fall  LIVING ENVIRONMENT: Stairs: stairs to basement   2-3 stairs to front porch.    OCCUPATION: Retired  PLOF: Independent, kayaking, outdoor activity, guitar playing.  Has access to gym.  Walking for exercise.   Reading, computer work.  One place for playing music has 42 stairs to enter.   PATIENT GOALS: Reduce pain, keep strong.   OBJECTIVE:   DIAGNOSTIC FINDINGS:  Lumbar MRI 12/07/2023: IMPRESSION: 1. No acute findings or clear explanation for the patient's symptoms. 2. Multilevel spondylosis with disc bulging, endplate osteophytes and facet hypertrophy as described. There is mild spinal stenosis at L2-3. 3. Moderate foraminal narrowing on the right at L1-2, on the left at L2-3 and on the right at L5-S1.    PATIENT SURVEYS:  Patient-Specific Activity Scoring Scheme  0 represents unable to perform. 10 represents able to perform at prior level. 0 1 2 3 4 5 6 7 8 9  10 (Date and Score)   Activity Eval  01/12/2024    1. Prolonged standing (> 5 mins)  2    2. Prolonged walking (5-10 mins)  4    3. Household activity 6   4. Sitting prolonged  6   5. Stairs 4   Score 4.4 avg    Total score = sum of the activity scores/number of activities Minimum detectable change (90%CI) for average score = 2 points Minimum detectable change (90%CI) for single activity score = 3 points  SCREENING FOR RED FLAGS: 01/12/2024 Bowel or bladder incontinence: No Cauda equina syndrome: No  COGNITION: 01/12/2024 Overall cognitive status: WFL normal      SENSATION: 01/12/2024 WFL  MUSCLE LENGTH: 01/12/2024 Passive SLR Lt: approx. 70 Passive SLR Rt approx. 75  POSTURE:  01/12/2024 Reduced lumbar lordosis in standing, mild increased in mid thoracic kyphosis.   PALPATION: 01/12/2024 No specific tenderness to light touch in standing posture evaluation.   LUMBAR ROM:  01/12/2024 Directional Preference Assessment: Centralization: not  observed Peripheralization: not observed   AROM Eval 01/12/2024 01/31/2024 02/28/2024  Flexion To mid shin  To mid sin   Extension 25% with very limited lumbar mobility, mild discomfort noted in end range  Repeated x 6 with improvement to 50% 50% WFL , tightness in back  75% WFL  Right lateral flexion     Left lateral flexion     Right rotation     Left rotation      (Blank rows = not tested)  LOWER EXTREMITY ROM:      Right Eval 01/12/2024 Left Eval 01/12/2024  Hip flexion    Hip extension    Hip abduction    Hip adduction    Hip internal rotation    Hip external rotation    Knee flexion    Knee extension    Ankle dorsiflexion    Ankle  plantarflexion    Ankle inversion    Ankle eversion     (Blank rows = not tested)  LOWER EXTREMITY MMT:    MMT Right Eval 01/12/2024 Left Eval 01/12/2024  Hip flexion 5/5 5/5  Hip extension    Hip abduction    Hip adduction    Hip internal rotation    Hip external rotation    Knee flexion 5/5 5/5  Knee extension 5/5 5/5  Ankle dorsiflexion 5/5 5/5  Ankle plantarflexion    Ankle inversion        Grip strength 86.7, 91.1 lbs  94,91 lbs   (Blank rows = not tested)  LUMBAR SPECIAL TESTS:  01/12/2024 (-) slump, crossed slr   FUNCTIONAL TESTS:  02/14/2024:  18 inch chair sit to stand s UE assist  01/12/2024 18 inch chair sit to stand s UE assist on 1st attempt Lt SLS : < 5 seconds with min A to stop LOB Rt SLS: < 3 seconds, with min A to stop LOB  GAIT: 01/12/2024 Independent ambulation                                                                                                                                                                                                                   TODAY'S TREATMENT:                                                                                                         DATE: 02/28/2024  Therex: UBE UE/LE for Rom 6 mins Incline stretch 30 sec x 3 with x 10 PF after each set.   Updated HEP sheet and printed two new ones.   Neuro Re-ed(balance control, core stability, muscle activation) Standing feet together stance with scapular retraction rows blue band 2 x 12 Standing feet together stance with bilateral gh ext blue band 2 x 12 Double green band antirotation walk outs with arms in 80 deg flexion outstretched 5 sec hold x 6  Prone plank on knees 1 min x 2  Supine hip flexion isometric with UE for core activation  5 sec hold x 6 bilaterally      TODAY'S TREATMENT:                                                                                                         DATE: 02/14/2024  Therex: Recumbent bike lvl 4 9 mins for ROM, 6.5 mins   Neuro Re-ed Tandem stance on foam 1 min x 1 bilaterally with occasional HHA  Tandem ambulation fwd in // bars with occasional UE on bar on 6 ft of foam x 6 going forward Feet together across foam bar 1 min balance with SBA Standing blue band Gh ext with core stability focus 2 x 12 slow movement focus.  Standing blue band bilateral rows c scapular retraction focus 2 x 12  Standing bilateral GH ER with scap retraction no money  green band x12   TherActivity (to improve stairs, squatting, transfers) Leg press in 45 deg back elevation 87 lbs 2 x 12 double leg, single leg 43 lbs 2 x 12 bilaterally    TODAY'S TREATMENT:                                                                                                         DATE: 01/31/2024  Therex: Recumbent bike lvl 6 interval training :12 seconds top of each minute for 8 mins, seat 9  TherActivity (to improve stairs, squatting, transfers) Leg press in 45 deg back elevation 75 lbs x 24 double leg, single leg 2 x 12 43 lbs  Farmer's carry 20 lb kettle bell 40 ft lap in room with alternating up /down 6 inch step with ramp combo, performed with carrying weight in each hand.    Neuro Re-ed (muscle activation, postural control) Standing blue band rows c scapular retraction 2 x  12 Standing GH ext bilaterally 2 x 12  Tandem stance 1 min x 1 bilaterally with SBA and occasional HHA    TODAY'S TREATMENT:                                                                                                         DATE: 01/12/2024  Therex:    HEP instruction/performance c cues for techniques, handout provided.  Trial set performed of each for comprehension and symptom assessment.  See below for exercise list.  Additional time spent in review of plan of importance to progress exercise routine from pain relief techniques initially towards strengthening, endurance and balance intervention for long term gains and maintenance including return to gym based workout routine as able. Spent time in question and answer about plan as well as exercise options.   PATIENT EDUCATION:  Education details: HEP, POC Person educated: Patient Education method: Programmer, Multimedia, Demonstration, Verbal cues, and Handouts Education comprehension: verbalized understanding, returned demonstration, and verbal cues required  HOME EXERCISE PROGRAM: Access Code: [email or web address] URL: [email or web address] Date: 02/28/2024 Prepared by: Ozell Silvan  Exercises - Standing Lumbar Extension  - 2-3 x daily - 7 x weekly - 1 sets - 6 reps - 2 hold - Seated Scapular Retraction  - 2-3 x daily - 7 x weekly - 1 sets - 6 reps - 2 hold - Supine Lower Trunk Rotation  - 2-3 x daily - 7 x weekly - 1 sets - 3-6 reps - 15 hold - Supine Figure 4 pull towards  - 2-3 x daily - 7 x weekly - 1 sets - 3-6 reps - 15-30 hold - Supine Figure 4 Piriformis Stretch  - 2-3 x daily - 7 x weekly - 1 sets - 3-6 reps - 15-30 hold - Supine Bridge  - 1-2 x daily - 7 x weekly - 1-2 sets - 12 reps - 2 hold - Plank on Knees  - 1 x daily - 7 x weekly - 1 sets - 3 reps - to fatigue hold - Hooklying Isometric Hip Flexion  - 1 x daily - 7 x weekly - 1 sets - 6 reps - 5-10 hold  ASSESSMENT:  CLINICAL IMPRESSION: Spent time focused today  on core stability and steady holds throughout body in activity.  Good overall performance noted.  Pt to benefit from continued functional strengthening and stability improvements throughout body.   OBJECTIVE IMPAIRMENTS: decreased activity tolerance, decreased balance, decreased coordination, decreased endurance, decreased mobility, difficulty walking, decreased ROM, decreased strength, hypomobility, increased fascial restrictions, impaired perceived functional ability, increased muscle spasms, impaired flexibility, improper body mechanics, postural dysfunction, and pain.   ACTIVITY LIMITATIONS: carrying, lifting, bending, sitting, standing, squatting, stairs, transfers, and locomotion level  PARTICIPATION LIMITATIONS: meal prep, cleaning, laundry, interpersonal relationship, driving, shopping, community activity, and workouts  PERSONAL FACTORS: Time since onset of injury/illness/exacerbation and multiple treatment areas, ADD, anemia, anxiety, arthritis, depression, GERD, History of oral cancer, osteoporosis, history of polo.  are also affecting patient's functional outcome.   REHAB POTENTIAL: Good  CLINICAL DECISION MAKING: Evolving/moderate complexity  EVALUATION COMPLEXITY: Moderate   GOALS: Goals reviewed with patient? Yes  SHORT TERM GOALS: (target date for Short term goals are 3 weeks 02/02/2024)  1. Patient will demonstrate independent use of home exercise program to maintain progress from in clinic treatments.  Goal status: Met 01/31/2024  LONG TERM GOALS: (target dates for all long term goals are 10 weeks  03/22/2024 )   1. Patient will demonstrate/report pain at worst less than or equal to 2/10 to facilitate minimal limitation in daily activity secondary to pain symptoms.  Goal status: on going 02/14/2024   2. Patient will demonstrate independent use of home exercise program to facilitate ability to maintain/progress functional gains from skilled physical therapy services.  Goal  status: on going 02/14/2024   3. Patient will demonstrate Patient specific functional scale avg > or = 8/10 to indicate reduced disability due  to condition.   Goal status: on going 02/14/2024   4. Patient will demonstrate lumbar extension 100 % WFL s symptoms to facilitate upright standing, walking posture at PLOF s limitation.  Goal status: on going 02/14/2024   5.  Patient will demonstrate SLS bilateral > 10 seconds to indicated improved stability in ambulation.   Goal status: New   6.  Patient will demonstrate ascending/descending stairs reciprocally s UE assist for community integration.   Goal status: on going 02/14/2024   7.  Will plan on goal from dynamometry testing for LE.  Goal Status:   PLAN:  PT FREQUENCY: 1-2x/week  PT DURATION: 10 weeks  PLANNED INTERVENTIONS: Can include 02853- PT Re-evaluation, 97110-Therapeutic exercises, 97530- Therapeutic activity, [id number]- Neuromuscular re-education, 97535- Self Care, 97140- Manual therapy, [phone]- Gait training, [phone]- Orthotic Fit/training, [phone]- Canalith repositioning, [id number]- Aquatic Therapy, [phone]- Electrical stimulation (unattended), [id number] Physical performance testing, 97016- Vasopneumatic device, [id number]- Ultrasound, [id number]- Traction (mechanical), [id number]- Ionotophoresis 4mg /ml Dexamethasone,  20560 - Needle insertion w/o injection 1 or 2 muscles, 20561 - Needle insertion w/o injection 3 or more muscles.    Patient/Family education, Balance training, Stair training, Taping, Dry Needling, Joint mobilization, Joint manipulation, Spinal manipulation, Spinal mobilization, Scar mobilization, Vestibular training, Visual/preceptual remediation/compensation, DME instructions, Cryotherapy, and Moist heat.  All performed as medically necessary.  All included unless contraindicated  PLAN FOR NEXT SESSION:  Core stability, balance, strengthening.    Measure leg length, dynamometry for knee extension strength when appropriate.   Ozell Silvan,  PT, DPT, OCS, ATC 02/28/24  2:25 PM     "

## 2024-03-06 ENCOUNTER — Encounter: Admitting: Rehabilitative and Restorative Service Providers"

## 2024-03-08 ENCOUNTER — Ambulatory Visit: Admitting: Internal Medicine

## 2024-03-20 ENCOUNTER — Encounter: Admitting: Rehabilitative and Restorative Service Providers"

## 2024-03-27 ENCOUNTER — Encounter: Admitting: Rehabilitative and Restorative Service Providers"

## 2024-04-03 ENCOUNTER — Encounter: Admitting: Rehabilitative and Restorative Service Providers"

## 2024-04-10 ENCOUNTER — Encounter: Admitting: Rehabilitative and Restorative Service Providers"

## 2024-04-17 ENCOUNTER — Encounter: Admitting: Rehabilitative and Restorative Service Providers"

## 2024-05-21 ENCOUNTER — Ambulatory Visit: Admitting: Podiatry
